# Patient Record
Sex: Male | Born: 1955 | Race: Black or African American | Hispanic: No | Marital: Married | Smoking: Never smoker
Health system: Southern US, Community
[De-identification: ages and names within clinical notes are randomized; demographics above are authoritative.]

## PROBLEM LIST (undated history)

## (undated) DIAGNOSIS — T8131XA Disruption of external operation (surgical) wound, not elsewhere classified, initial encounter: Secondary | ICD-10-CM

## (undated) DIAGNOSIS — K219 Gastro-esophageal reflux disease without esophagitis: Secondary | ICD-10-CM

## (undated) DIAGNOSIS — Z973 Presence of spectacles and contact lenses: Secondary | ICD-10-CM

## (undated) DIAGNOSIS — I2510 Atherosclerotic heart disease of native coronary artery without angina pectoris: Secondary | ICD-10-CM

## (undated) DIAGNOSIS — N189 Chronic kidney disease, unspecified: Secondary | ICD-10-CM

## (undated) DIAGNOSIS — Q613 Polycystic kidney, unspecified: Secondary | ICD-10-CM

## (undated) DIAGNOSIS — E039 Hypothyroidism, unspecified: Secondary | ICD-10-CM

## (undated) DIAGNOSIS — D351 Benign neoplasm of parathyroid gland: Secondary | ICD-10-CM

## (undated) DIAGNOSIS — C801 Malignant (primary) neoplasm, unspecified: Secondary | ICD-10-CM

## (undated) DIAGNOSIS — Z87442 Personal history of urinary calculi: Secondary | ICD-10-CM

## (undated) DIAGNOSIS — G4730 Sleep apnea, unspecified: Secondary | ICD-10-CM

## (undated) DIAGNOSIS — Z841 Family history of disorders of kidney and ureter: Secondary | ICD-10-CM

## (undated) DIAGNOSIS — D649 Anemia, unspecified: Secondary | ICD-10-CM

## (undated) DIAGNOSIS — E785 Hyperlipidemia, unspecified: Secondary | ICD-10-CM

## (undated) DIAGNOSIS — C649 Malignant neoplasm of unspecified kidney, except renal pelvis: Secondary | ICD-10-CM

## (undated) DIAGNOSIS — I10 Essential (primary) hypertension: Secondary | ICD-10-CM

## (undated) DIAGNOSIS — T7840XA Allergy, unspecified, initial encounter: Secondary | ICD-10-CM

## (undated) DIAGNOSIS — N186 End stage renal disease: Secondary | ICD-10-CM

## (undated) HISTORY — PX: NASAL SEPTOPLASTY W/ TURBINOPLASTY: SHX2070

## (undated) HISTORY — PX: COLONOSCOPY: SHX174

## (undated) HISTORY — DX: Sleep apnea, unspecified: G47.30

## (undated) HISTORY — DX: Chronic kidney disease, unspecified: N18.9

## (undated) HISTORY — DX: Allergy, unspecified, initial encounter: T78.40XA

## (undated) HISTORY — DX: Malignant (primary) neoplasm, unspecified: C80.1

## (undated) HISTORY — DX: Gastro-esophageal reflux disease without esophagitis: K21.9

## (undated) HISTORY — PX: VASECTOMY: SHX75

## (undated) HISTORY — PX: HERNIA REPAIR: SHX51

## (undated) HISTORY — DX: Polycystic kidney, unspecified: Q61.3

## (undated) HISTORY — DX: Hyperlipidemia, unspecified: E78.5

## (undated) HISTORY — DX: Essential (primary) hypertension: I10

---

## 2003-02-26 ENCOUNTER — Encounter: Payer: Self-pay | Admitting: Emergency Medicine

## 2003-02-26 ENCOUNTER — Ambulatory Visit (HOSPITAL_BASED_OUTPATIENT_CLINIC_OR_DEPARTMENT_OTHER): Admission: RE | Admit: 2003-02-26 | Discharge: 2003-02-26 | Payer: Self-pay | Admitting: Urology

## 2005-07-26 ENCOUNTER — Ambulatory Visit: Payer: Self-pay | Admitting: Internal Medicine

## 2005-08-01 ENCOUNTER — Ambulatory Visit: Payer: Self-pay | Admitting: Internal Medicine

## 2005-10-23 ENCOUNTER — Ambulatory Visit: Payer: Self-pay | Admitting: Internal Medicine

## 2005-10-30 ENCOUNTER — Ambulatory Visit: Payer: Self-pay | Admitting: Internal Medicine

## 2006-04-26 ENCOUNTER — Ambulatory Visit: Payer: Self-pay | Admitting: Internal Medicine

## 2006-05-14 ENCOUNTER — Ambulatory Visit: Payer: Self-pay | Admitting: Internal Medicine

## 2006-11-14 ENCOUNTER — Ambulatory Visit: Payer: Self-pay | Admitting: Internal Medicine

## 2006-11-27 ENCOUNTER — Ambulatory Visit: Payer: Self-pay | Admitting: Internal Medicine

## 2007-05-02 ENCOUNTER — Encounter: Payer: Self-pay | Admitting: Internal Medicine

## 2007-05-02 DIAGNOSIS — I10 Essential (primary) hypertension: Secondary | ICD-10-CM | POA: Insufficient documentation

## 2007-05-02 DIAGNOSIS — J309 Allergic rhinitis, unspecified: Secondary | ICD-10-CM | POA: Insufficient documentation

## 2007-05-02 DIAGNOSIS — E785 Hyperlipidemia, unspecified: Secondary | ICD-10-CM | POA: Insufficient documentation

## 2007-05-02 DIAGNOSIS — Z87442 Personal history of urinary calculi: Secondary | ICD-10-CM | POA: Insufficient documentation

## 2007-05-06 ENCOUNTER — Ambulatory Visit: Payer: Self-pay | Admitting: Internal Medicine

## 2007-05-06 LAB — CONVERTED CEMR LAB
ALT: 26 units/L (ref 0–53)
AST: 18 units/L (ref 0–37)
Albumin: 3.7 g/dL (ref 3.5–5.2)
Alkaline Phosphatase: 48 units/L (ref 39–117)
BUN: 23 mg/dL (ref 6–23)
Basophils Absolute: 0.1 10*3/uL (ref 0.0–0.1)
Basophils Relative: 1 % (ref 0.0–1.0)
CO2: 29 meq/L (ref 19–32)
Chloride: 106 meq/L (ref 96–112)
Cholesterol: 249 mg/dL (ref 0–200)
Creatinine, Ser: 1.2 mg/dL (ref 0.4–1.5)
HCT: 40.7 % (ref 39.0–52.0)
Hemoglobin: 13.9 g/dL (ref 13.0–17.0)
MCHC: 34 g/dL (ref 30.0–36.0)
Monocytes Absolute: 0.6 10*3/uL (ref 0.2–0.7)
Monocytes Relative: 9.6 % (ref 3.0–11.0)
Protein, U semiquant: NEGATIVE
RBC: 4.61 M/uL (ref 4.22–5.81)
RDW: 13 % (ref 11.5–14.6)
Total Bilirubin: 1.1 mg/dL (ref 0.3–1.2)
Total CHOL/HDL Ratio: 5.1
Total Protein: 6.9 g/dL (ref 6.0–8.3)
Triglycerides: 174 mg/dL — ABNORMAL HIGH (ref 0–149)
Urobilinogen, UA: 0.2
VLDL: 35 mg/dL (ref 0–40)
WBC Urine, dipstick: NEGATIVE
pH: 6.5

## 2007-05-13 ENCOUNTER — Ambulatory Visit: Payer: Self-pay | Admitting: Internal Medicine

## 2007-06-12 ENCOUNTER — Ambulatory Visit: Payer: Self-pay | Admitting: Internal Medicine

## 2007-07-15 ENCOUNTER — Encounter: Payer: Self-pay | Admitting: Internal Medicine

## 2007-07-15 ENCOUNTER — Ambulatory Visit: Payer: Self-pay | Admitting: Internal Medicine

## 2007-11-11 ENCOUNTER — Ambulatory Visit: Payer: Self-pay | Admitting: Internal Medicine

## 2008-03-16 ENCOUNTER — Ambulatory Visit: Payer: Self-pay | Admitting: Internal Medicine

## 2008-04-13 ENCOUNTER — Ambulatory Visit: Payer: Self-pay | Admitting: Internal Medicine

## 2008-04-13 DIAGNOSIS — M109 Gout, unspecified: Secondary | ICD-10-CM | POA: Insufficient documentation

## 2008-05-18 ENCOUNTER — Ambulatory Visit: Payer: Self-pay | Admitting: Internal Medicine

## 2008-08-12 ENCOUNTER — Ambulatory Visit: Payer: Self-pay | Admitting: Internal Medicine

## 2008-08-12 DIAGNOSIS — J069 Acute upper respiratory infection, unspecified: Secondary | ICD-10-CM | POA: Insufficient documentation

## 2009-02-01 ENCOUNTER — Ambulatory Visit: Payer: Self-pay | Admitting: Internal Medicine

## 2009-02-01 ENCOUNTER — Telehealth: Payer: Self-pay | Admitting: Internal Medicine

## 2009-05-25 ENCOUNTER — Encounter: Payer: Self-pay | Admitting: Internal Medicine

## 2009-06-03 ENCOUNTER — Ambulatory Visit: Payer: Self-pay | Admitting: Internal Medicine

## 2009-06-03 LAB — CONVERTED CEMR LAB
ALT: 40 units/L (ref 0–53)
BUN: 23 mg/dL (ref 6–23)
CO2: 28 meq/L (ref 19–32)
Chloride: 111 meq/L (ref 96–112)
Creatinine, Ser: 1.3 mg/dL (ref 0.4–1.5)
Eosinophils Absolute: 0.4 10*3/uL (ref 0.0–0.7)
Eosinophils Relative: 6.2 % — ABNORMAL HIGH (ref 0.0–5.0)
Glucose, Bld: 98 mg/dL (ref 70–99)
HCT: 42.1 % (ref 39.0–52.0)
Lymphs Abs: 2.1 10*3/uL (ref 0.7–4.0)
MCHC: 33.2 g/dL (ref 30.0–36.0)
MCV: 90.3 fL (ref 78.0–100.0)
Monocytes Absolute: 0.6 10*3/uL (ref 0.1–1.0)
PSA: 0.97 ng/mL (ref 0.10–4.00)
Platelets: 219 10*3/uL (ref 150.0–400.0)
Potassium: 3.7 meq/L (ref 3.5–5.1)
Total Bilirubin: 0.8 mg/dL (ref 0.3–1.2)
Total Protein: 7 g/dL (ref 6.0–8.3)
Uric Acid, Serum: 8.2 mg/dL — ABNORMAL HIGH (ref 4.0–7.8)
WBC: 5.8 10*3/uL (ref 4.5–10.5)

## 2009-10-28 ENCOUNTER — Ambulatory Visit: Payer: Self-pay | Admitting: Internal Medicine

## 2009-10-28 DIAGNOSIS — K219 Gastro-esophageal reflux disease without esophagitis: Secondary | ICD-10-CM | POA: Insufficient documentation

## 2010-01-07 ENCOUNTER — Ambulatory Visit: Payer: Self-pay | Admitting: Family Medicine

## 2010-01-09 ENCOUNTER — Telehealth (INDEPENDENT_AMBULATORY_CARE_PROVIDER_SITE_OTHER): Payer: Self-pay | Admitting: *Deleted

## 2010-01-13 ENCOUNTER — Telehealth: Payer: Self-pay | Admitting: Internal Medicine

## 2010-02-09 ENCOUNTER — Ambulatory Visit: Payer: Self-pay | Admitting: Internal Medicine

## 2010-02-09 LAB — CONVERTED CEMR LAB: Uric Acid, Serum: 5.2 mg/dL (ref 4.0–7.8)

## 2010-03-02 ENCOUNTER — Ambulatory Visit: Payer: Self-pay | Admitting: Internal Medicine

## 2010-04-28 ENCOUNTER — Telehealth: Payer: Self-pay | Admitting: Internal Medicine

## 2010-05-11 ENCOUNTER — Ambulatory Visit: Payer: Self-pay | Admitting: Internal Medicine

## 2010-05-11 DIAGNOSIS — G4710 Hypersomnia, unspecified: Secondary | ICD-10-CM | POA: Insufficient documentation

## 2010-05-11 DIAGNOSIS — G4730 Sleep apnea, unspecified: Secondary | ICD-10-CM

## 2010-05-30 ENCOUNTER — Encounter: Payer: Self-pay | Admitting: Internal Medicine

## 2010-05-30 ENCOUNTER — Ambulatory Visit (HOSPITAL_BASED_OUTPATIENT_CLINIC_OR_DEPARTMENT_OTHER): Admission: RE | Admit: 2010-05-30 | Discharge: 2010-05-30 | Payer: Self-pay | Admitting: Internal Medicine

## 2010-06-12 ENCOUNTER — Ambulatory Visit: Payer: Self-pay | Admitting: Pulmonary Disease

## 2010-06-15 DIAGNOSIS — G4733 Obstructive sleep apnea (adult) (pediatric): Secondary | ICD-10-CM | POA: Insufficient documentation

## 2010-06-29 ENCOUNTER — Encounter: Payer: Self-pay | Admitting: Internal Medicine

## 2010-07-04 ENCOUNTER — Telehealth: Payer: Self-pay | Admitting: Internal Medicine

## 2010-07-26 ENCOUNTER — Encounter: Payer: Self-pay | Admitting: Internal Medicine

## 2010-07-27 ENCOUNTER — Ambulatory Visit: Payer: Self-pay | Admitting: Pulmonary Disease

## 2010-08-10 ENCOUNTER — Ambulatory Visit: Payer: Self-pay | Admitting: Internal Medicine

## 2010-10-24 NOTE — Assessment & Plan Note (Signed)
Summary: consult for management of osa   Visit Type:  Initial Consult Copy to:  Bluford Kaufmann MD Primary Provider/Referring Provider:  Marletta Lor  MD  CC:  sleep consult. pt states he stops breathing at night and snores. .  History of Present Illness: The pt is a 55y/o male who I have been asked to see for management of osa.  He underwent split night study in Sept of this year, and was found to have severe osa with AHI of 74/hr during the diagnostic portion of the study.  He was then placed on cpap, and titrated to pressure of 12cm, but never achieved REM on this setting.  The pt has been wearing cpap for the last one month, but feels his pressure is too high.  It can awaken him during the night.  He is not aware of the ramp time or setting.  He is comfortable with his current mask, and denies any major leaks.  He goes to bed btw 10pm and 1am, and arises at 8am to start his day.  Prior to cpap, he had frequent awakenings during the night, and was unrested the following am.  He also had issues with EDS at work.  Since he has been on cpap, he is much more rested in the am's, and has very little sleepiness at work.   The pt tells me that his weight is up 15 pounds  over the last 2 years.  Current Medications (verified): 1)  Amlodipine Besylate 10 Mg Tabs (Amlodipine Besylate) .... One Daily 2)  Lisinopril 40 Mg Tabs (Lisinopril) .... One Daily 3)  Hydrocodone-Acetaminophen 5-500 Mg Tabs (Hydrocodone-Acetaminophen) .... One or Two Every 6 Hours As Needed For Pain 4)  Allopurinol 300 Mg Tabs (Allopurinol) .... Take 1 Tablet By Mouth Every Morning 5)  Vimovo 500-20 Mg Tbec (Naproxen-Esomeprazole) .... One Tablet Two Times A Day  Allergies (verified): No Known Drug Allergies  Past History:  Past Medical History: Allergic rhinitis Hyperlipidemia Hypertension Nephrolithiasis, hx of Gout GERD severe osa.Marland KitchenMarland KitchenAHI 74/hr on 05/2010  Past Surgical History: Reviewed history from  05/02/2007 and no changes required. Nasal septoplasty Vasectomy  Family History: Reviewed history from 05/13/2007 and no changes required. father is in his 61s and in good health details is mother's health unknown 8 brothers and 3 sisters all in good health no family history of cancer  Social History: Reviewed history from 05/13/2007 and no changes required. two children, two grandchildren  never smoker 2 beers per week Married occupation--supervisor  Review of Systems       The patient complains of productive cough, non-productive cough, acid heartburn, indigestion, weight change, nasal congestion/difficulty breathing through nose, and hand/feet swelling.  The patient denies shortness of breath with activity, shortness of breath at rest, coughing up blood, chest pain, irregular heartbeats, loss of appetite, abdominal pain, difficulty swallowing, sore throat, tooth/dental problems, headaches, sneezing, itching, ear ache, anxiety, depression, joint stiffness or pain, rash, change in color of mucus, and fever.    Vital Signs:  Patient profile:   55 year old male Height:      71 inches Weight:      230.13 pounds BMI:     32.21 O2 Sat:      99 % on Room air Temp:     97.9 degrees F oral Pulse rate:   54 / minute BP sitting:   122 / 88  (left arm) Cuff size:   regular  Vitals Entered By: Charma Igo (July 27, 2010  1:32 PM)  O2 Flow:  Room air CC: sleep consult. pt states he stops breathing at night, snores.  Comments meds and allergies updated Phone number updated Charma Igo  July 27, 2010 1:33 PM    Physical Exam  General:  ow male in nad Eyes:  PERRLA and EOMI.   Nose:  mild narrowing due to turbinate hypertrophy, but patent Mouth:  very thick and long uvula and palate , significant side wall narrowing Neck:  no jvd, tmg, LN Lungs:  clear to auscultation, no wheezing or rhonchi Heart:  rrr, no mrg  Abdomen:  soft and nontender, bs+ Extremities:  no edema  noted, no cyanosis  Neurologic:  alert and oriented, moves all 4.   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  He has seen a definite improvement in the quality of his sleep and his daytime alertness level.  He is wearing cpap compliantly by his history, but is having some issue with the pressure.  He is currently on his required level of 12cm, but has some tolerance issues at times.  He has no issue with the mask fit currently.  He is not using his ramp button consistently to help with pressure tolerance, and I have asked him to do so.  If he continues to have tolerance issues, he will let me know.  I have also encouraged him to work aggressively on weight loss.  Medications Added to Medication List This Visit: 1)  Vimovo 500-20 Mg Tbec (Naproxen-esomeprazole) .... One tablet two times a day  Other Orders: Consultation Level IV [id number])  Patient Instructions: 1)  stay on cpap at current setting.  Can increase ramp time to 81min, and can use as often as you need during the night if you awaken. 2)  work on weight loss 3)  followup with me in 54mos, but call if having tolerance issues.   Immunization History:  Influenza Immunization History:    Influenza:  historical (07/26/2010)

## 2010-10-24 NOTE — Assessment & Plan Note (Signed)
Summary: ankle pain//ccm   Vital Signs:  Patient profile:   55 year old male Weight:      222 pounds Temp:     97.9 degrees F oral BP sitting:   140 / 100  (left arm) Cuff size:   regular  Vitals Entered By: Cay Schillings LPN (May 19, 624THL QA348G AM) CC: c/o  (L) ankle swelling and pain  Is Patient Diabetic? No   CC:  c/o  (L) ankle swelling and pain .  History of Present Illness: 55 year old patient has a history of episodic gout.  He was seen one month ago with a gouty arthritis involving both ankles and was placed on allopurinol at that time for the past several days.  She has had increasing pain involving primarily his left lateral ankle, but also somewhat involving the dorsal aspect of the left foot and left great toe.  He as tolerated  allopurinol, well and has been taking 300 mg daily.  He has treated hypertension.  Blood pressure one month ago was in a low normal range.  Preventive Screening-Counseling & Management  Alcohol-Tobacco     Smoking Status: never  Allergies (verified): No Known Drug Allergies  Past History:  Past Medical History: Reviewed history from 10/28/2009 and no changes required. Allergic rhinitis Hyperlipidemia Hypertension Nephrolithiasis, hx of Gout GERD  Social History: Smoking Status:  never  Review of Systems       The patient complains of difficulty walking.  The patient denies anorexia, fever, weight loss, weight gain, vision loss, decreased hearing, hoarseness, chest pain, syncope, dyspnea on exertion, peripheral edema, prolonged cough, headaches, hemoptysis, abdominal pain, melena, hematochezia, severe indigestion/heartburn, hematuria, incontinence, genital sores, muscle weakness, suspicious skin lesions, transient blindness, depression, unusual weight change, abnormal bleeding, enlarged lymph nodes, angioedema, breast masses, and testicular masses.    Physical Exam  General:  overweight-appearing.  140/90overweight-appearing.     Msk:  soft tissue swelling and  excessive warmth involving his left ankle especially the lateral aspect   Impression & Recommendations:  Problem # 1:  GOUT (ICD-274.9)  His updated medication list for this problem includes:    Allopurinol 300 Mg Tabs (Allopurinol) .Marland Kitchen... Take 1 tablet by mouth every morning    His updated medication list for this problem includes:    Allopurinol 300 Mg Tabs (Allopurinol) .Marland Kitchen... Take 1 tablet by mouth every morning  Problem # 2:  HYPERTENSION (ICD-401.9)  His updated medication list for this problem includes:    Amlodipine Besylate 10 Mg Tabs (Amlodipine besylate) ..... One daily    Lisinopril 40 Mg Tabs (Lisinopril) ..... One daily  His updated medication list for this problem includes:    Amlodipine Besylate 10 Mg Tabs (Amlodipine besylate) ..... One daily    Lisinopril 40 Mg Tabs (Lisinopril) ..... One daily  Complete Medication List: 1)  Amlodipine Besylate 10 Mg Tabs (Amlodipine besylate) .... One daily 2)  Lisinopril 40 Mg Tabs (Lisinopril) .... One daily 3)  Hydrocodone-acetaminophen 5-500 Mg Tabs (Hydrocodone-acetaminophen) .... One or two every 6 hours as needed for pain 4)  Allopurinol 300 Mg Tabs (Allopurinol) .... Take 1 tablet by mouth every morning 5)  Prednisone 20 Mg Tabs (Prednisone) .... One twice daily as needed for gout  Other Orders: Venipuncture [id number]) TLB-Uric Acid, Blood (84550-URIC)  Patient Instructions: 1)  Please schedule a follow-up appointment in 4 months. 2)  Limit your Sodium (Salt) to less than 2 grams a day(slightly less than 1/2 a teaspoon) to prevent fluid retention, swelling, or worsening  of symptoms. 3)  It is important that you exercise regularly at least 20 minutes 5 times a week. If you develop chest pain, have severe difficulty breathing, or feel very tired , stop exercising immediately and seek medical attention. 4)  You need to lose weight. Consider a lower calorie diet and regular exercise.  5)   Check your Blood Pressure regularly. If it is above: 150/90 you should make an appointment. Prescriptions: PREDNISONE 20 MG TABS (PREDNISONE) one twice daily as needed for gout  #20 x 0   Entered and Authorized by:   Marletta Lor  MD   Signed by:   Marletta Lor  MD on 02/09/2010   Method used:   Electronically to        [id number]* (retail)       [place], [place]  [address]       Ph: [phone]       Fax: [phone]   RxID:   [id number] LISINOPRIL 40 MG TABS (LISINOPRIL) one daily  #90 Tablet x 2   Entered and Authorized by:   Marletta Lor  MD   Signed by:   Marletta Lor  MD on 02/09/2010   Method used:   Electronically to        [id number]* (retail)       [place], [place]  [address]       Ph: [phone]       Fax: [phone]   RxID:   [id number] AMLODIPINE BESYLATE 10 MG TABS (AMLODIPINE BESYLATE) one daily  #90 Tablet x 2   Entered and Authorized by:   Marletta Lor  MD   Signed by:   Marletta Lor  MD on 02/09/2010   Method used:   Electronically to        [id number]* (retail)       [place], [place]  [address]       Ph: [phone]       Fax: [phone]   RxIDPJ:[id number]   [id number] ALLOPURINOL 300 MG TABS (ALLOPURINOL) Take 1 tablet by mouth every morning  #100 x 3   Entered and Authorized by:   Marletta Lor  MD   Signed by:   Marletta Lor  MD on 02/09/2010   Method used:   Electronically to        [facility] [id number] (retail)       [address]       [place], [place]  [address]       Ph: [phone]       Fax: [phone]   RxID:   [id number]

## 2010-10-24 NOTE — Progress Notes (Signed)
Summary: refer pulom - cpap setting  Phone Note Call from Patient   Caller: Patient Call For: [phone] Summary of Call: pt has questions about cpap machine ?adjustment to air flow. pt was advise to call [facility] for futher instruction. [facility] told him to [facility].  Initial call taken by: Glo Herring,  July 04, 2010 11:29 AM  Follow-up for Phone Call        please schedule appt with Dr Sabino Donovan Follow-up by: Marletta Lor  MD,  July 04, 2010 12:49 PM  Additional Follow-up for Phone Call Additional follow up Details #1::        attempt to call pt - cell# vm - LMTCB if anyquestions - will make referal to Dr. Gwenette Greet [facility]) to eval and he will be the one directing any changes tosettings on CPAP machine  KIK Additional Follow-up by: Cay Schillings LPN,  October 11, 624THL 2:17 PM

## 2010-10-24 NOTE — Assessment & Plan Note (Signed)
Summary: F/U ON HTN // RS   Vital Signs:  Patient profile:   55 year old male Weight:      214 pounds BMI:     29.95 Temp:     98.3 degrees F oral BP sitting:   124 / 88  (left arm) Cuff size:   regular  Vitals Entered By: Chipper Oman, RN (October 28, 2009 11:03 AM) CC: C/o recent heartburn.   CC:  C/o recent heartburn..  History of Present Illness: 55 year old patient who is seen today for follow-up of hypertension, gout, and gastroesophageal reflux disease.  He has had no recurrent gout.  Laboratory studies did confirm hyperuricemia.  He is no longer on diuretic therapy for blood pressure control.  His only complaint today is some mild/moderate reflux symptoms.  Allergies: No Known Drug Allergies  Past History:  Past Medical History: Allergic rhinitis Hyperlipidemia Hypertension Nephrolithiasis, hx of Gout GERD  Review of Systems       The patient complains of severe indigestion/heartburn.  The patient denies anorexia, fever, weight loss, weight gain, vision loss, decreased hearing, hoarseness, chest pain, syncope, dyspnea on exertion, peripheral edema, prolonged cough, headaches, hemoptysis, abdominal pain, melena, hematochezia, hematuria, incontinence, genital sores, muscle weakness, suspicious skin lesions, transient blindness, difficulty walking, depression, unusual weight change, abnormal bleeding, enlarged lymph nodes, angioedema, breast masses, and testicular masses.    Physical Exam  General:  Well-developed,well-nourished,in no acute distress; alert,appropriate and cooperative throughout examination Head:  Normocephalic and atraumatic without obvious abnormalities. No apparent alopecia or balding. Eyes:  No corneal or conjunctival inflammation noted. EOMI. Perrla. Funduscopic exam benign, without hemorrhages, exudates or papilledema. Vision grossly normal. Mouth:  Oral mucosa and oropharynx without lesions or exudates.  Teeth in good repair. Neck:  No  deformities, masses, or tenderness noted. Lungs:  Normal respiratory effort, chest expands symmetrically. Lungs are clear to auscultation, no crackles or wheezes. Heart:  Normal rate and regular rhythm. S1 and S2 normal without gallop, murmur, click, rub or other extra sounds. Abdomen:  Bowel sounds positive,abdomen soft and non-tender without masses, organomegaly or hernias noted.   Impression & Recommendations:  Problem # 1:  GOUT (ICD-274.9)  His updated medication list for this problem includes:    Colchicine 0.6 Mg Tabs (Colchicine) .Marland Kitchen... Take 1 tablet by mouth twice a day as needed  His updated medication list for this problem includes:    Colchicine 0.6 Mg Tabs (Colchicine) .Marland Kitchen... Take 1 tablet by mouth twice a day as needed  Problem # 2:  HYPERTENSION (ICD-401.9)  His updated medication list for this problem includes:    Amlodipine Besylate 10 Mg Tabs (Amlodipine besylate) ..... One daily    Lisinopril 40 Mg Tabs (Lisinopril) ..... One daily  His updated medication list for this problem includes:    Amlodipine Besylate 10 Mg Tabs (Amlodipine besylate) ..... One daily    Lisinopril 40 Mg Tabs (Lisinopril) ..... One daily  Problem # 3:  GERD (ICD-530.81)  Complete Medication List: 1)  Colchicine 0.6 Mg Tabs (Colchicine) .... Take 1 tablet by mouth twice a day as needed 2)  Diclofenac Sodium 75 Mg Tbec (Diclofenac sodium) .... Take 1 tablet by mouth twice a day 3)  Amlodipine Besylate 10 Mg Tabs (Amlodipine besylate) .... One daily 4)  Lisinopril 40 Mg Tabs (Lisinopril) .... One daily 5)  Hydrocodone-acetaminophen 5-500 Mg Tabs (Hydrocodone-acetaminophen) .... One or two every 6 hours as needed for pain  Patient Instructions: 1)  Please schedule a follow-up appointment in 6  months. 2)  Limit your Sodium (Salt) to less than 2 grams a day(slightly less than 1/2 a teaspoon) to prevent fluid retention, swelling, or worsening of symptoms. 3)  Avoid foods high in acid (tomatoes,  citrus juices, spicy foods). Avoid eating within two hours of lying down or before exercising. Do not over eat; try smaller more frequent meals. Elevate head of bed twelve inches when sleeping. 4)  It is important that you exercise regularly at least 20 minutes 5 times a week. If you develop chest pain, have severe difficulty breathing, or feel very tired , stop exercising immediately and seek medical attention.

## 2010-10-24 NOTE — Assessment & Plan Note (Signed)
Summary: 3 MONTH FOLLOW UP/CJR rsc bmp/njr   Vital Signs:  Patient profile:   55 year old male Weight:      235 pounds Temp:     98.1 degrees F oral BP sitting:   116 / 88  (left arm) Cuff size:   regular  Vitals Entered By: Cay Schillings LPN (November 17, 624THL 11:44 AM) CC: 3 mos rov - doing ok , ankles swelling? Is Patient Diabetic? No   CC:  3 mos rov - doing ok  and ankles swelling?Marland Kitchen  Allergies (verified): No Known Drug Allergies  and  Complete Medication List: 1)  Amlodipine Besylate 10 Mg Tabs (Amlodipine besylate) .... One daily 2)  Lisinopril 40 Mg Tabs (Lisinopril) .... One daily 3)  Allopurinol 300 Mg Tabs (Allopurinol) .... Take 1 tablet by mouth every morning 4)  Vimovo 500-20 Mg Tbec (Naproxen-esomeprazole) .... One tablet two times a day 5)  Furosemide 40 Mg Tabs (Furosemide) .... One daily as needed  for swelling  Patient Instructions: 1)  Please schedule a follow-up appointment in 6 months. 2)  Limit your Sodium (Salt). 3)  It is important that you exercise regularly at least 20 minutes 5 times a week. If you develop chest pain, have severe difficulty breathing, or feel very tired , stop exercising immediately and seek medical attention. 4)  You need to lose weight. Consider a lower calorie diet and regular exercise.  5)  Check your Blood Pressure regularly. If it is above: 150/90 you should make an appointment. Prescriptions: FUROSEMIDE 40 MG TABS (FUROSEMIDE) one daily as needed  for swelling  #50 x 2   Entered and Authorized by:   Marletta Lor  MD   Signed by:   Marletta Lor  MD on 08/10/2010   Method used:   Electronically to        [facility] [address] (retail)       [place], [place]  [address]       Ph: [phone]       Fax: [phone]   RxID:   [id number] ALLOPURINOL 300 MG TABS (ALLOPURINOL) Take 1 tablet by mouth every morning  #100 x 3   Entered and Authorized by:   Marletta Lor  MD   Signed by:   Marletta Lor  MD on 08/10/2010   Method used:   Electronically to        [id number] (retail)       [place], [place]  [address]       Ph: [phone]       Fax: [phone]   RxID:   [id number] LISINOPRIL 40 MG TABS (LISINOPRIL) one daily  #90 Tablet x 2   Entered and Authorized by:   Marletta Lor  MD   Signed by:   Marletta Lor  MD on 08/10/2010   Method used:   Electronically to        [facility] [id number] (retail)       [place], [place]  [address]       Ph: [phone]       Fax: [phone]   RxID:   [id number] AMLODIPINE BESYLATE 10 MG TABS (AMLODIPINE BESYLATE) one daily  #90 Tablet x 2   Entered and Authorized by:   Marletta Lor  MD   Signed by:   Marletta Lor  MD on 08/10/2010   Method used:   Electronically to        [facility] [address] (retail)       [place], [place]  [address]       Ph: [phone]       Fax: [phone]   RxID:   [id number]       Appended Document: 3 MONTH FOLLOW UP/CJR rsc bmp/njr 55 year old patient who has treated hypertension, who was seen for follow-up.  He has a history of severe OSA and was seen in and pulmonary follow-up two weeks ago.  Antihypertensive regimen includes amlodipine 10 mg daily.  He has had some intermittent pedal edema.  Thiazide diuretics have been discontinued due to his history of gout.  His gout has been stable.  He also has gastroesophageal reflux disease  HEENT-  unremarkable neck supple.  No adenopathy chest clear vascular normal S1, S2, no murmurs or gallops abdomen soft and nontender.  No organomegaly extremities revealed trace pedal edema  Impression hypertension, stable pedal  edema aggravated by amlodipine  Recommendations- salt restriction encouraged.  He was given a prescription for furosemide to take  p.r.n. edema.  Will consider dose  reduction of amlodipine if swelling persists in spite of p.r.n., furosemide, and salt restriction

## 2010-10-24 NOTE — Progress Notes (Signed)
Summary: Call-A-Nurse Report    Call-A-Nurse Triage Call Report Triage Record Num: [id number] Operator: Hughes Better Patient Name: Phillip Blair Call Date & Time: 01/07/2010 9:25:27AM Patient Phone: [phone] PCP: Marletta Lor Patient Gender: Male PCP Fax : [phone] Patient DOB: [birth year] Practice Name: [facility] Reason for Call: Patient is having gout in both feet. Onset of symptoms: 2-3 days. Patient is in severe pain. Patient advised to go to [facility] office now for evaluation. Protocol(s) Used: Foot Non-Injury Recommended Outcome per Protocol: See Provider within 24 hours Reason for Outcome: New onset mild to moderate pain that has not improved with 24 hours of home care Care Advice:  ~ 01/07/2010 9:49:32AM Page 1 of 1 [email or web address]

## 2010-10-24 NOTE — Progress Notes (Signed)
Summary: missed 6 mos rov  Phone Note Outgoing Call   Call placed by: Cay Schillings LPN,  August  5, 624THL 9:06 AM Call placed to: Patient Summary of Call: missed 6 mos rov this am - pt forgot - transferred to sheduling to r/s .  Initial call taken by: Cay Schillings LPN,  August  5, 624THL 9:07 AM

## 2010-10-24 NOTE — Assessment & Plan Note (Signed)
Summary: GOUT//VGJ   Vital Signs:  Patient profile:   55 year old male Weight:      218 pounds Temp:     98.5 degrees F oral BP sitting:   110 / 78  (left arm)  Vitals Entered By: Malachi Bonds (January 07, 2010 10:02 AM) CC: gout flare in both feet    CC:  gout flare in both feet .  History of Present Illness: fred. is a 55 y/o male with recurrent gout rx w   colchicine.  last ua level 8.2 9/10.Marland Kitchennow w 3 day h/o pain both grt. toes.  Allergies: No Known Drug Allergies  Past History:  Past medical, surgical, family and social histories (including risk factors) reviewed for relevance to current acute and chronic problems.  Past Medical History: Reviewed history from 10/28/2009 and no changes required. Allergic rhinitis Hyperlipidemia Hypertension Nephrolithiasis, hx of Gout GERD  Past Surgical History: Reviewed history from 05/02/2007 and no changes required. Nasal septoplasty Vasectomy  Family History: Reviewed history from 05/13/2007 and no changes required. father is in his 76s and in good health details is mother's health unknown 8 brothers and 3 sisters all in good health no family history of cancer  Social History: Reviewed history from 05/13/2007 and no changes required. two children, two grandchildren nonsmoker Married  Review of Systems      See HPI  Physical Exam  General:  Well-developed,well-nourished,in no acute distress; alert,appropriate and cooperative throughout examination Msk:  bilat grt toes red,swollen   Impression & Recommendations:  Problem # 1:  GOUT (ICD-274.9) Assessment Deteriorated  The following medications were removed from the medication list:    Colchicine 0.6 Mg Tabs (Colchicine) .Marland Kitchen... Take 1 tablet by mouth twice a day as needed His updated medication list for this problem includes:    Allopurinol 300 Mg Tabs (Allopurinol) .Marland Kitchen... Take 1 tablet by mouth every morning  Complete Medication List: 1)  Amlodipine Besylate  10 Mg Tabs (Amlodipine besylate) .... One daily 2)  Lisinopril 40 Mg Tabs (Lisinopril) .... One daily 3)  Hydrocodone-acetaminophen 5-500 Mg Tabs (Hydrocodone-acetaminophen) .... One or two every 6 hours as needed for pain 4)  Allopurinol 300 Mg Tabs (Allopurinol) .... Take 1 tablet by mouth every morning 5)  Prednisone 20 Mg Tabs (Prednisone) .... Uad for acute gout  Patient Instructions: 1)  pred 20 mg...2 x 3days, 1 x 3 days, 1/2 x 3 days stop 2)  vicodin q 4h as needed pain 3)  allopurinol 300mg ..1 day forever  4)  Please schedule a follow-up appointment as needed. Prescriptions: HYDROCODONE-ACETAMINOPHEN 5-500 MG TABS (HYDROCODONE-ACETAMINOPHEN) one or two every 6 hours as needed for pain  #30 x 0   Entered and Authorized by:   Dorena Cookey MD   Signed by:   Dorena Cookey MD on 01/07/2010   Method used:   Print then Give to Patient   RxID:   [id number] PREDNISONE 20 MG TABS (PREDNISONE) UAD for acute gout  #30 x 1   Entered and Authorized by:   Dorena Cookey MD   Signed by:   Dorena Cookey MD on 01/07/2010   Method used:   Print then Give to Patient   RxID:   [id number] ALLOPURINOL 300 MG TABS (ALLOPURINOL) Take 1 tablet by mouth every morning  #100 x 3   Entered and Authorized by:   Dorena Cookey MD   Signed by:   Dorena Cookey MD on 01/07/2010   Method used:   Print  then Give to Patient   RxID:   [id number]

## 2010-10-24 NOTE — Assessment & Plan Note (Signed)
Summary: L FOOT PAIN // RS   Vital Signs:  Patient profile:   55 year old male Weight:      223 pounds Temp:     98.0 degrees F oral BP sitting:   140 / 92  (left arm) Cuff size:   regular  Vitals Entered By: Cay Schillings LPN (June  9, 624THL 624THL PM) CC: c/o (L) top foot pain and swelling  Is Patient Diabetic? No   CC:  c/o (L) top foot pain and swelling .  History of Present Illness: 55 year old patient has a history of recurrent gout.  He was seen here earlier with suspected gout involving the left foot and ankle and was treated  with a short course of prednisone with a prompt clinical response.  For the past few days.  He has had increasing pain involving the left ankle and dorsal aspect of the left foot.  He is a difficult time with ambulation.  A  uric acid level was checked last visit and was less than 6. he has treated hypertension, which has been stable  Preventive Screening-Counseling & Management  Alcohol-Tobacco     Smoking Status: never  Allergies (verified): No Known Drug Allergies  Physical Exam  General:  Well-developed,well-nourished,in no acute distress; alert,appropriate and cooperative throughout examination Msk:  patient has tenderness mild soft tissue swelling and excessive warmth involving the proximal dorsal aspect of the left foot at the ankle.  There is also mild tenderness involving the left lateral ankle   Impression & Recommendations:  Problem # 1:  GOUT (ICD-274.9)  His updated medication list for this problem includes:    Allopurinol 300 Mg Tabs (Allopurinol) .Marland Kitchen... Take 1 tablet by mouth every morning    His updated medication list for this problem includes:    Allopurinol 300 Mg Tabs (Allopurinol) .Marland Kitchen... Take 1 tablet by mouth every morning  Problem # 2:  HYPERTENSION (ICD-401.9)  His updated medication list for this problem includes:    Amlodipine Besylate 10 Mg Tabs (Amlodipine besylate) ..... One daily    Lisinopril 40 Mg Tabs  (Lisinopril) ..... One daily  His updated medication list for this problem includes:    Amlodipine Besylate 10 Mg Tabs (Amlodipine besylate) ..... One daily    Lisinopril 40 Mg Tabs (Lisinopril) ..... One daily  Complete Medication List: 1)  Amlodipine Besylate 10 Mg Tabs (Amlodipine besylate) .... One daily 2)  Lisinopril 40 Mg Tabs (Lisinopril) .... One daily 3)  Hydrocodone-acetaminophen 5-500 Mg Tabs (Hydrocodone-acetaminophen) .... One or two every 6 hours as needed for pain 4)  Allopurinol 300 Mg Tabs (Allopurinol) .... Take 1 tablet by mouth every morning 5)  Prednisone 20 Mg Tabs (Prednisone) .... One twice daily as needed for gout 6)  Prednisone 5 Mg Tabs (Prednisone) .... One twice daily  Other Orders: Prescription Created Electronically [id number])  Patient Instructions: 1)  prednisone 20 mg twice daily for one week followed by 5 mg twice daily for one month 2)  after prednisone completed start Aleve twice daily Prescriptions: PREDNISONE 5 MG TABS (PREDNISONE) one twice daily  #60 x 0   Entered and Authorized by:   Marletta Lor  MD   Signed by:   Marletta Lor  MD on 03/02/2010   Method used:   Print then Give to Patient   RxID:   [id number] PREDNISONE 20 MG TABS (PREDNISONE) one twice daily as needed for gout  #20 x 0   Entered and Authorized by:   Doretha Sou  Burnice Logan  MD   Signed by:   Marletta Lor  MD on 03/02/2010   Method used:   Print then Give to Patient   RxID:   [id number] PREDNISONE 5 MG TABS (PREDNISONE) one twice daily  #60 x 0   Entered and Authorized by:   Marletta Lor  MD   Signed by:   Marletta Lor  MD on 03/02/2010   Method used:   Electronically to        [facility] [address] (retail)       [place], [place]  [address]       Ph: [phone]       Fax: [phone]   RxIDSO:[id number] PREDNISONE 20 MG TABS (PREDNISONE) one twice daily as needed for gout  #20 x 0    Entered and Authorized by:   Marletta Lor  MD   Signed by:   Marletta Lor  MD on 03/02/2010   Method used:   Electronically to        [facility] [name] (retail)       [address]       [place], [place]  [address]       Ph: [phone]       Fax: [phone]   RxID:   [id number]

## 2010-10-24 NOTE — Medication Information (Signed)
Summary: Order for CPAP Supplies/[facility]  Order for CPAP Supplies/[facility]   Imported By: Laural Benes 07/04/2010 09:38:48  _____________________________________________________________________  External Attachment:    Type:   Image     Comment:   External Document

## 2010-10-24 NOTE — Progress Notes (Signed)
  Phone Note Call from Patient   Caller: Patient Reason for Call: Acute Illness Summary of Call: call relayed to me by the answering service! Patient had been seen by Dr. Sherren Mocha for gout. He is completing a course of prednisone but stopped medication when pain in his ankle came back thinking it was the prednisone (warning literature lists bone pain as a complication). Now his ankle is very painful and he cannot bear weight.  Informed pt that with starting allopurinol he can have a flare of gout. Advised to take prednisone 40mg  once daily x 2 and then 20mg  once daily until Rx gone. Continue allopurinol. Mayuse hydrocodone for uncontrolled pain. Initial call taken by: Neena Rhymes MD,  January 13, 2010 10:33 AM

## 2010-10-24 NOTE — Assessment & Plan Note (Signed)
Summary: 6 MONTH ROV/NJR/PT RSC/CJR  well aand a the and and a and a and a little and he and his the E. he is who is in and a  Vital Signs:  Patient profile:   55 year old male Weight:      229 pounds Temp:     98.3 degrees F oral BP sitting:   138 / 88  (left arm) Cuff size:   regular  Vitals Entered By: Cay Schillings LPN (August 18, 624THL 8:23 AM) aCC: 6 mos rov - doing well Is Patient Diabetic? No   CC:  6 mos rov - doing well.  History of Present Illness: 55 year old patient who is seen today for follow up of his hypertension.  He has a history of Gastrosoft or reflux disease, which has worsened with weight gain.  He has a history of gout, which has been stable.  May complaint is some loud snoring at night, associated with some daytime sleepiness.  Apparently, he  underwent a sleep study 8 to 10 years ago.  Preventive Screening-Counseling & Management  Alcohol-Tobacco     Smoking Status: never  Allergies (verified): No Known Drug Allergies  Past History:  Past Medical History: Allergic rhinitis Hyperlipidemia Hypertension Nephrolithiasis, hx of Gout GERD rule out obstructive sleep apnea  Past Surgical History: Reviewed history from 05/02/2007 and no changes required. Nasal septoplasty Vasectomy  Review of Systems       The patient complains of weight gain and severe indigestion/heartburn.  The patient denies anorexia, fever, weight loss, vision loss, decreased hearing, hoarseness, chest pain, syncope, dyspnea on exertion, peripheral edema, prolonged cough, headaches, hemoptysis, abdominal pain, melena, hematochezia, hematuria, incontinence, genital sores, muscle weakness, suspicious skin lesions, transient blindness, difficulty walking, depression, unusual weight change, abnormal bleeding, enlarged lymph nodes, angioedema, breast masses, and testicular masses.    Physical Exam  General:  overweight-appearing.  140/84overweight-appearing.   Head:   Normocephalic and atraumatic without obvious abnormalities. No apparent alopecia or balding. Eyes:  No corneal or conjunctival inflammation noted. EOMI. Perrla. Funduscopic exam benign, without hemorrhages, exudates or papilledema. Vision grossly normal. Mouth:  Oral mucosa and oropharynx without lesions or exudates.  Teeth in good repair.pharyngeal crowding and low-lying uvula.  pharyngeal crowding and low-lying uvula.   Neck:  No deformities, masses, or tenderness noted. Lungs:  Normal respiratory effort, chest expands symmetrically. Lungs are clear to auscultation, no crackles or wheezes. Heart:  Normal rate and regular rhythm. S1 and S2 normal without gallop, murmur, click, rub or other extra sounds. Abdomen:  Bowel sounds positive,abdomen soft and non-tender without masses, organomegaly or hernias noted. Msk:  No deformity or scoliosis noted of thoracic or lumbar spine.   Pulses:  R and L carotid,radial,femoral,dorsalis pedis and posterior tibial pulses are full and equal bilaterally Extremities:  No clubbing, cyanosis, edema, or deformity noted with normal full range of motion of all joints.     Impression & Recommendations:  Problem # 1:  GERD (ICD-530.81) weight loss, and anti-reflux measures discussed  Problem # 2:  HYPERTENSION (ICD-401.9)  His updated medication list for this problem includes:    Amlodipine Besylate 10 Mg Tabs (Amlodipine besylate) ..... One daily    Lisinopril 40 Mg Tabs (Lisinopril) ..... One daily  His updated medication list for this problem includes:    Amlodipine Besylate 10 Mg Tabs (Amlodipine besylate) ..... One daily    Lisinopril 40 Mg Tabs (Lisinopril) ..... One daily  Problem # 3:  GOUT (ICD-274.9)  His updated medication list for  this problem includes:    Allopurinol 300 Mg Tabs (Allopurinol) .Marland Kitchen... Take 1 tablet by mouth every morning  His updated medication list for this problem includes:    Allopurinol 300 Mg Tabs (Allopurinol) .Marland Kitchen... Take 1  tablet by mouth every morning  Problem # 4:  HYPERSOMNIA, ASSOCIATED WITH SLEEP APNEA (ICD-780.53)  will check a sleep study  Complete Medication List: 1)  Amlodipine Besylate 10 Mg Tabs (Amlodipine besylate) .... One daily 2)  Lisinopril 40 Mg Tabs (Lisinopril) .... One daily 3)  Hydrocodone-acetaminophen 5-500 Mg Tabs (Hydrocodone-acetaminophen) .... One or two every 6 hours as needed for pain 4)  Allopurinol 300 Mg Tabs (Allopurinol) .... Take 1 tablet by mouth every morning  Other Orders: Sleep Disorder Referral (Sleep Disorder)  Patient Instructions: 1)  Please schedule a follow-up appointment in 3 months. 2)  Limit your Sodium (Salt). 3)  Avoid foods high in acid (tomatoes, citrus juices, spicy foods). Avoid eating within two hours of lying down or before exercising. Do not over eat; try smaller more frequent meals. Elevate head of bed twelve inches when sleeping. 4)  It is important that you exercise regularly at least 20 minutes 5 times a week. If you develop chest pain, have severe difficulty breathing, or feel very tired , stop exercising immediately and seek medical attention. 5)  You need to lose weight. Consider a lower calorie diet and regular exercise.  6)  Check your Blood Pressure regularly. If it is above:150/90  you should make an appointment. Prescriptions: ALLOPURINOL 300 MG TABS (ALLOPURINOL) Take 1 tablet by mouth every morning  #100 x 3   Entered and Authorized by:   Marletta Lor  MD   Signed by:   Marletta Lor  MD on 05/11/2010   Method used:   Electronically to        [facility] [id number] (retail)       [place], [place]  [address]       Ph: [phone]       Fax: [phone]   [id number] LISINOPRIL 40 MG TABS (LISINOPRIL) one daily  #90 Tablet x 2   Entered and Authorized by:   Marletta Lor  MD   Signed by:   Marletta Lor  MD on 05/11/2010   Method used:   Electronically to         [facility] [id number] (retail)       [place], [place]  [address]       Ph: [phone]       Fax: [phone]   [id number] AMLODIPINE BESYLATE 10 MG TABS (AMLODIPINE BESYLATE) one daily  #90 Tablet x 2   Entered and Authorized by:   Marletta Lor  MD   Signed by:   Marletta Lor  MD on 05/11/2010   Method used:   Electronically to        [facility] [id number] (retail)       [place], [place]  [address]       Ph: [phone]       Fax: [phone]   [id number]   Appended Document: 6 MONTH ROV/NJR/PT RSC/CJR please schedule for [facility] to set up CPAP due due severe OSA  Appended Document: 6 MONTH ROV/NJR/PT RSC/CJR sleep study found  on echart - given to Dr. Nonie Hoyer - reviewed - I apptmpted to call pt at [facility] - sleep apnea - will need CPAP set up - info given to terri to arrange. 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

## 2010-10-27 NOTE — Miscellaneous (Signed)
Summary: Flu Shot/[facility]  Flu Shot/[facility]   Imported By: Laural Benes 08/15/2010 10:56:36  _____________________________________________________________________  External Attachment:    Type:   Image     Comment:   External Document

## 2011-01-16 ENCOUNTER — Ambulatory Visit (INDEPENDENT_AMBULATORY_CARE_PROVIDER_SITE_OTHER): Payer: PRIVATE HEALTH INSURANCE | Admitting: Internal Medicine

## 2011-01-16 ENCOUNTER — Encounter: Payer: Self-pay | Admitting: Internal Medicine

## 2011-01-16 DIAGNOSIS — I10 Essential (primary) hypertension: Secondary | ICD-10-CM

## 2011-01-16 NOTE — Assessment & Plan Note (Signed)
Suboptimal control however may represent isolated elevation. Recommend out patient blood pressure log to be submitted for review over the next several days. Reminded benefit of low-sodium diet exercise weight loss and attention to treatment of OSA. Continue current medication regimen pending review of blood pressure log.

## 2011-01-16 NOTE — Progress Notes (Signed)
  Subjective:    Patient ID: Phillip Blair, male    DOB: [birth year], 55 y.o.   MRN: [id number]  HPI Pt presents to clinic for evaluation of elevated blood pressure. States he was in his normal state of health until this morning after going to the dentist developed frontal headache mild dizziness and minimal shortness of breath without chest pain or chest pressure. Check his blood pressure found to be a hundred fifty-nine over ninety-eight. Has history of hypertension and is compliant with  medication without adverse effect. Has history of OSA treated with CPAP and is compliant with CPAP. No recent outpatient blood pressure values for review. Reviewed last three outpatient blood pressures demonstrating normotensive to high normal control. Blood pressure rechecked today in clinic to be a hundred thirty over ninety patient states symptoms have nearly resolved with only minimal residual headache. Taking no other new medications and has had no other obvious trigger. No alleviating or exacerbating factors. No other complaints  Reviewed past medical history, medications and allergies.    Review of Systems see history of present illness     Objective:   Physical Exam    Physical Exam  [nursing notereviewed. Constitutional:  appears well-developed and well-nourished. No distress.  HENT:  Head: Normocephalic and atraumatic.   Eyes: Conjunctivae are normal. No scleral icterus. Funduscopic exam normal Neck: Neck supple.  Cardiovascular: Normal rate, regular rhythm and normal heart sounds.  Exam reveals no gallop and no friction rub.   No murmur heard. Pulmonary/Chest: Effort normal and breath sounds normal. No respiratory distress. She has no wheezes. She has no rales.  Lymphadenopathy:     no cervical adenopathy.  Neurological:  alert.  Skin: Skin is warm and dry.  not diaphoretic.     Assessment & Plan:

## 2011-02-08 ENCOUNTER — Encounter: Payer: Self-pay | Admitting: Internal Medicine

## 2011-02-08 ENCOUNTER — Ambulatory Visit (INDEPENDENT_AMBULATORY_CARE_PROVIDER_SITE_OTHER): Payer: PRIVATE HEALTH INSURANCE | Admitting: Internal Medicine

## 2011-02-08 DIAGNOSIS — K219 Gastro-esophageal reflux disease without esophagitis: Secondary | ICD-10-CM

## 2011-02-08 DIAGNOSIS — I10 Essential (primary) hypertension: Secondary | ICD-10-CM

## 2011-02-08 DIAGNOSIS — G4733 Obstructive sleep apnea (adult) (pediatric): Secondary | ICD-10-CM

## 2011-02-08 MED ORDER — ALLOPURINOL 300 MG PO TABS: 300.0000 mg | ORAL_TABLET | Freq: Every day | ORAL | Status: DC

## 2011-02-08 MED ORDER — FUROSEMIDE 40 MG PO TABS: 40.0000 mg | ORAL_TABLET | Freq: Every day | ORAL | Status: DC | PRN

## 2011-02-08 MED ORDER — LISINOPRIL 40 MG PO TABS: 40.0000 mg | ORAL_TABLET | Freq: Every day | ORAL | Status: DC

## 2011-02-08 MED ORDER — AMLODIPINE BESYLATE 10 MG PO TABS: 10.0000 mg | ORAL_TABLET | Freq: Every day | ORAL | Status: DC

## 2011-02-08 NOTE — Patient Instructions (Signed)
Limit your sodium (Salt) intake    It is important that you exercise regularly, at least 20 minutes 3 to 4 times per week.  If you develop chest pain or shortness of breath seek  medical attention.  Please check your blood pressure on a regular basis.  If it is consistently greater than 150/90, please make an office appointment.  Return in 6 months for follow-up   

## 2011-02-08 NOTE — Progress Notes (Signed)
  Subjective:    Patient ID: Phillip Blair, male    DOB: [birth year], 55 y.o.   MRN: [id number]  HPI  55 year old patient who is seen today for followup. He has a history of hypertension on triple therapy. He was seen last month after elevator reading at his dentist. Blood pressures have been better controlled. He has a history of gout which has been well controlled on allopurinol. He also has a history of obstructive sleep apnea requiring CPAP which he tolerates well. No new concerns or complaints. There has been some weight gain over time   Review of Systems  Constitutional: Negative for fever, chills, appetite change and fatigue.  HENT: Negative for hearing loss, ear pain, congestion, sore throat, trouble swallowing, neck stiffness, dental problem, voice change and tinnitus.   Eyes: Negative for pain, discharge and visual disturbance.  Respiratory: Negative for cough, chest tightness, wheezing and stridor.   Cardiovascular: Negative for chest pain, palpitations and leg swelling.  Gastrointestinal: Negative for nausea, vomiting, abdominal pain, diarrhea, constipation, blood in stool and abdominal distention.  Genitourinary: Negative for urgency, hematuria, flank pain, discharge, difficulty urinating and genital sores.  Musculoskeletal: Negative for myalgias, back pain, joint swelling, arthralgias and gait problem.  Skin: Negative for rash.  Neurological: Negative for dizziness, syncope, speech difficulty, weakness, numbness and headaches.  Hematological: Negative for adenopathy. Does not bruise/bleed easily.  Psychiatric/Behavioral: Negative for behavioral problems and dysphoric mood. The patient is not nervous/anxious.        Objective:   Physical Exam  Constitutional: He is oriented to person, place, and time. He appears well-developed and well-nourished. No distress.       Overweight Blood pressure 120/80  HENT:  Head: Normocephalic.  Right Ear: External ear normal.  Left Ear:  External ear normal.  Eyes: Conjunctivae and EOM are normal.  Neck: Normal range of motion.  Cardiovascular: Normal rate and normal heart sounds.   Pulmonary/Chest: Breath sounds normal.  Abdominal: Bowel sounds are normal.  Musculoskeletal: Normal range of motion. He exhibits no edema and no tenderness.  Neurological: He is alert and oriented to person, place, and time.  Psychiatric: He has a normal mood and affect. His behavior is normal.          Assessment & Plan:   Hypertension stable. Lifestyle issues addressed Gout. Also stable Obstructive sleep apnea. We'll continue CPAP  CPX 6 months

## 2011-02-09 NOTE — Op Note (Signed)
Phillip Blair, Phillip Blair                        ACCOUNT NO.:  [id number]   MEDICAL RECORD NO.:  [id number]                   PATIENT TYPE:  AMB   LOCATION:  [facility]                                 FACILITY:  [facility]   PHYSICIAN:  Hanley Ben, M.D.               DATE OF BIRTH:  Mar 17, 1956   DATE OF PROCEDURE:  02/26/2003  DATE OF DISCHARGE:                                 OPERATIVE REPORT   PREOPERATIVE DIAGNOSES:  Right ureteral stone with hydronephrosis.   POSTOPERATIVE DIAGNOSES:  Right ureteral stone with hydronephrosis.   PROCEDURE DONE:  1. Cystoscopy.  2. Right retrograde pyelogram.  3. Ureteroscopy with stone extraction.  4. Insertion of double-J catheter.   SURGEON:  Hanley Ben, M.D.   ANESTHESIA:  General.   INDICATION:  The patient is a 55 year old male, who was seen in the  emergency room last night with sudden onset of severe right flank pain,  associated with nausea and vomiting.  He was in the emergency room, and a CT  scan of the abdomen and pelvis showed right perinephric stranding and  hydronephrosis and a 5 mm stone in the right distal ureter.  The patient is  scheduled for cystoscopy and stone manipulation.   DESCRIPTION OF PROCEDURE:  Under general anesthesia, the patient was prepped  and draped and placed in the dorsal lithotomy position.  A #22 Wappler  cystoscope was inserted in the bladder.  The urethra was normal.  There was  no stone or tumor in the bladder.  The ureteral orifices were in normal  position and shape.  A cone-tip catheter was passed through the cystoscope  into the right ureteral orifice.  There was a filling defect in the right  distal ureter with proximal hydronephrosis.  The cone-tip catheter was then  removed.  A Glidewire was passed over an open-end catheter into the right  ureter, and the open-end catheter was advanced over the Glidewire into the  distal ureter.  The Glidewire was then removed.  A guidewire was passed  through the open-end catheter into the ureter.  The open-end catheter was  removed.  The bladder was then emptied and the cystoscope removed.  A #6.5  French rigid ureteroscope was passed in the bladder and into the right  ureter.  The stone was visualized in the distal ureter and with a nitinol  basket, the stone was extracted.  The ureteroscope was removed.  The  guidewire was then backloaded into the cystoscope, and a #6 [place] - 26  double-J catheter was passed over the guidewire.  The proximal coil of the  double-J catheter was in the collecting system.  The distal coil was in the  bladder.  The bladder was then emptied, and the cystoscope and guidewire  were removed.   The patient tolerated the procedure well and left the OR in satisfactory  condition to postanesthesia care unit.  Hanley Ben, M.D.    MN/MEDQ  D:  02/26/2003  T:  02/26/2003  Job:  [id number]

## 2011-05-22 ENCOUNTER — Encounter: Payer: Self-pay | Admitting: Internal Medicine

## 2011-05-22 ENCOUNTER — Telehealth: Payer: Self-pay | Admitting: Internal Medicine

## 2011-05-22 ENCOUNTER — Ambulatory Visit (INDEPENDENT_AMBULATORY_CARE_PROVIDER_SITE_OTHER): Payer: PRIVATE HEALTH INSURANCE | Admitting: Internal Medicine

## 2011-05-22 DIAGNOSIS — I10 Essential (primary) hypertension: Secondary | ICD-10-CM

## 2011-05-22 MED ORDER — FIRST-DUKES MOUTHWASH MT SUSP: OROMUCOSAL | Status: DC

## 2011-05-22 NOTE — Patient Instructions (Signed)
Call or return to clinic prn if these symptoms worsen or fail to improve as anticipated.

## 2011-05-22 NOTE — Progress Notes (Signed)
  Subjective:    Patient ID: Phillip Blair, male    DOB: 10/21/1955, 55 y.o.   MRN: [id number]  HPI 55 year old patient who is seen today with 2 concerns. Approximately 2 weeks go he noted a soft tissue nodule involving his left upper posterior shoulder region. This has been nonpainful. He states that he did not notice this prior to 2 weeks ago. It has been unchanged since this period of time. He has treated hypertension which has been stable. His chief complaint is swelling involving the buccal mucosa. He states this interferes with chewing due to the swelling. He is on no new medications   Review of Systems  Constitutional: Negative for fever, chills, appetite change and fatigue.  HENT: Negative for hearing loss, ear pain, congestion, sore throat, trouble swallowing, neck stiffness, dental problem, voice change and tinnitus.   Eyes: Negative for pain, discharge and visual disturbance.  Respiratory: Negative for cough, chest tightness, wheezing and stridor.   Cardiovascular: Negative for chest pain, palpitations and leg swelling.  Gastrointestinal: Negative for nausea, vomiting, abdominal pain, diarrhea, constipation, blood in stool and abdominal distention.  Genitourinary: Negative for urgency, hematuria, flank pain, discharge, difficulty urinating and genital sores.  Musculoskeletal: Negative for myalgias, back pain, joint swelling, arthralgias and gait problem.  Skin: Positive for rash.  Neurological: Negative for dizziness, syncope, speech difficulty, weakness, numbness and headaches.  Hematological: Negative for adenopathy. Does not bruise/bleed easily.  Psychiatric/Behavioral: Negative for behavioral problems and dysphoric mood. The patient is not nervous/anxious.        Objective:   Physical Exam  Constitutional: He appears well-developed and well-nourished. No distress.       Blood pressure 120/80  HENT:       The patient did have some prominent buccal mucosa posteriorly  near the molar region. This area did not appear to be inflamed or erythematous. Oropharynx was benign. Dentition and gingiva appear normal  Skin:       3 cm freely movable nodule involving the left upper posterior shoulder region. This was consistent with a sebaceous cyst          Assessment & Plan:   Hypertension well controlled. Soft tissue swelling of the buccal mucosa. This does not appear to be inflamed we'll treat empirically with Dukes Magic mouthwash and observe. Sebaceous cyst left shoulder  The patient will be seen for a physical in 6 months. He will call if he does not promptly improve

## 2011-05-22 NOTE — Telephone Encounter (Signed)
Pharmacy needs clarification on prescription for Dukes Magic Mouthwash. Please contact.

## 2011-05-22 NOTE — Telephone Encounter (Signed)
Spoke with [facility] - quanity ?  147mls okay'd

## 2011-08-03 ENCOUNTER — Other Ambulatory Visit (INDEPENDENT_AMBULATORY_CARE_PROVIDER_SITE_OTHER): Payer: PRIVATE HEALTH INSURANCE

## 2011-08-03 DIAGNOSIS — Z0000 Encounter for general adult medical examination without abnormal findings: Secondary | ICD-10-CM

## 2011-08-03 LAB — POCT URINALYSIS DIPSTICK
Spec Grav, UA: 1.025
Urobilinogen, UA: 0.2
pH, UA: 5

## 2011-08-03 LAB — BASIC METABOLIC PANEL
BUN: 23 mg/dL (ref 6–23)
Calcium: 10.4 mg/dL (ref 8.4–10.5)
Creatinine, Ser: 1.5 mg/dL (ref 0.4–1.5)
GFR: 63.82 mL/min (ref >60.00)
Glucose, Bld: 117 mg/dL — ABNORMAL HIGH (ref 70–99)
Sodium: 141 mEq/L (ref 135–145)

## 2011-08-03 LAB — HEPATIC FUNCTION PANEL
Alkaline Phosphatase: 76 U/L (ref 39–117)
Bilirubin, Direct: 0.1 mg/dL (ref 0.0–0.3)

## 2011-08-03 LAB — CBC WITH DIFFERENTIAL/PLATELET
Basophils Absolute: 0 10*3/uL (ref 0.0–0.1)
Hemoglobin: 13.6 g/dL (ref 13.0–17.0)
Lymphocytes Relative: 33.6 % (ref 12.0–46.0)
Monocytes Relative: 8.1 % (ref 3.0–12.0)
Neutrophils Relative %: 50.9 % (ref 43.0–77.0)
Platelets: 181 10*3/uL (ref 150.0–400.0)
RDW: 14.4 % (ref 11.5–14.6)

## 2011-08-03 LAB — LIPID PANEL
HDL: 50.5 mg/dL (ref >39.00)
Triglycerides: 120 mg/dL (ref 0.0–149.0)
VLDL: 24 mg/dL (ref 0.0–40.0)

## 2011-08-03 LAB — PSA: PSA: 0.93 ng/mL (ref 0.10–4.00)

## 2011-08-10 ENCOUNTER — Ambulatory Visit (INDEPENDENT_AMBULATORY_CARE_PROVIDER_SITE_OTHER): Payer: PRIVATE HEALTH INSURANCE | Admitting: Internal Medicine

## 2011-08-10 ENCOUNTER — Encounter: Payer: Self-pay | Admitting: Internal Medicine

## 2011-08-10 VITALS — BP 118/80 | HR 76 | Temp 97.9°F | Resp 18 | Ht 71.0 in | Wt 214.0 lb

## 2011-08-10 DIAGNOSIS — E785 Hyperlipidemia, unspecified: Secondary | ICD-10-CM

## 2011-08-10 DIAGNOSIS — Z87442 Personal history of urinary calculi: Secondary | ICD-10-CM

## 2011-08-10 DIAGNOSIS — K219 Gastro-esophageal reflux disease without esophagitis: Secondary | ICD-10-CM

## 2011-08-10 DIAGNOSIS — M109 Gout, unspecified: Secondary | ICD-10-CM

## 2011-08-10 DIAGNOSIS — Z87448 Personal history of other diseases of urinary system: Secondary | ICD-10-CM

## 2011-08-10 DIAGNOSIS — J309 Allergic rhinitis, unspecified: Secondary | ICD-10-CM

## 2011-08-10 DIAGNOSIS — I10 Essential (primary) hypertension: Secondary | ICD-10-CM

## 2011-08-10 DIAGNOSIS — Z0000 Encounter for general adult medical examination without abnormal findings: Secondary | ICD-10-CM

## 2011-08-10 DIAGNOSIS — Z23 Encounter for immunization: Secondary | ICD-10-CM

## 2011-08-10 LAB — POCT URINALYSIS DIPSTICK
Bilirubin, UA: NEGATIVE
Ketones, UA: NEGATIVE
Leukocytes, UA: NEGATIVE
Nitrite, UA: NEGATIVE

## 2011-08-10 MED ORDER — AMLODIPINE BESYLATE 10 MG PO TABS: 10.0000 mg | ORAL_TABLET | Freq: Every day | ORAL | Status: DC

## 2011-08-10 MED ORDER — ALLOPURINOL 300 MG PO TABS: 300.0000 mg | ORAL_TABLET | Freq: Every day | ORAL | Status: DC

## 2011-08-10 MED ORDER — LISINOPRIL 40 MG PO TABS: 40.0000 mg | ORAL_TABLET | Freq: Every day | ORAL | Status: DC

## 2011-08-10 MED ORDER — FUROSEMIDE 40 MG PO TABS: 40.0000 mg | ORAL_TABLET | Freq: Every day | ORAL | Status: DC | PRN

## 2011-08-10 NOTE — Patient Instructions (Signed)
It is important that you exercise regularly, at least 20 minutes 3 to 4 times per week.  If you develop chest pain or shortness of breath seek  medical attention.  You need to lose weight.  Consider a lower calorie diet and regular exercise.  Please check your blood pressure on a regular basis.  If it is consistently greater than 150/90, please make an office appointment.  Return in 6 months for follow-up  Avoids foods high in acid such as tomatoes citrus juices, and spicy foods.  Avoid eating within two hours of lying down or before exercising.  Do not overheat.  Try smaller more frequent meals.  If symptoms persist, elevate the head of her bed 12 inches while sleeping.

## 2011-08-10 NOTE — Progress Notes (Signed)
Subjective:    Patient ID: Phillip Blair, male    DOB: 1955-12-08, 55 y.o.   MRN: [id number]  HPI  55 year old patient who is seen today for a wellness exam. Medical problems include obstructive sleep apnea he has history of gout that has been stable and also history of mild dyslipidemia. This has improved with better eating habits. He has treated hypertension and a history of allergic rhinitis he has gastroesophageal reflux disease he has remote history of nephrolithiasis.  Past Medical History  Diagnosis Date  . Allergy   . Hypertension   . Hyperlipidemia   . Chronic kidney disease   . GERD (gastroesophageal reflux disease)   . Gout     History   Social History  . Marital Status: Married    Spouse Name: N/A    Number of Children: N/A  . Years of Education: N/A   Occupational History  . Not on file.   Social History Main Topics  . Smoking status: Never Smoker   . Smokeless tobacco: Never Used  . Alcohol Use: No  . Drug Use: No  . Sexually Active: Not on file   Other Topics Concern  . Not on file   Social History Narrative  . No narrative on file    Past Surgical History  Procedure Date  . Vasectomy   . Nasal septoplasty w/ turbinoplasty     No family history on file.  No Known Allergies  Current Outpatient Prescriptions on File Prior to Visit  Medication Sig Dispense Refill  . allopurinol (ZYLOPRIM) 300 MG tablet Take 1 tablet (300 mg total) by mouth daily.  90 tablet  6  . amLODipine (NORVASC) 10 MG tablet Take 1 tablet (10 mg total) by mouth daily.  90 tablet  6  . Diphenhyd-Hydrocort-Nystatin (FIRST-DUKES MOUTHWASH) SUSP 1 teaspoon 4 times daily  1 Bottle  1  . furosemide (LASIX) 40 MG tablet Take 1 tablet (40 mg total) by mouth daily as needed.  90 tablet  6  . lisinopril (PRINIVIL,ZESTRIL) 40 MG tablet Take 1 tablet (40 mg total) by mouth daily.  90 tablet  6  . Naproxen-Esomeprazole (VIMOVO) 500-20 MG TBEC Take by mouth 2 (two) times daily.           BP 118/80  Pulse 76  Temp(Src) 97.9 F (36.6 C) (Oral)  Resp 18  Ht 5\' 11"  (1.803 m)  Wt 214 lb (97.07 kg)  BMI 29.85 kg/m2  SpO2 98%      Current Allergies:  No known allergies   Family History:   father is in his 79  and in good health  details is mother's health unknown  8 brothers and 3 sisters all in good health  no family history of cancer   Social History:  two children,  nonsmoker  Married   Review of Systems  Constitutional: Negative for fever, chills, activity change, appetite change and fatigue.  HENT: Negative for hearing loss, ear pain, congestion, rhinorrhea, sneezing, mouth sores, trouble swallowing, neck pain, neck stiffness, dental problem, voice change, sinus pressure and tinnitus.   Eyes: Negative for photophobia, pain, redness and visual disturbance.  Respiratory: Negative for apnea, cough, choking, chest tightness, shortness of breath and wheezing.   Cardiovascular: Negative for chest pain, palpitations and leg swelling.  Gastrointestinal: Negative for nausea, vomiting, abdominal pain, diarrhea, constipation, blood in stool, abdominal distention, anal bleeding and rectal pain.  Genitourinary: Negative for dysuria, urgency, frequency, hematuria, flank pain, decreased urine volume, discharge, penile swelling, scrotal  swelling, difficulty urinating, genital sores and testicular pain.  Musculoskeletal: Negative for myalgias, back pain, joint swelling, arthralgias and gait problem.  Skin: Negative for color change, rash and wound.  Neurological: Negative for dizziness, tremors, seizures, syncope, facial asymmetry, speech difficulty, weakness, light-headedness, numbness and headaches.  Hematological: Negative for adenopathy. Does not bruise/bleed easily.  Psychiatric/Behavioral: Negative for suicidal ideas, hallucinations, behavioral problems, confusion, sleep disturbance, self-injury, dysphoric mood, decreased concentration and agitation. The patient is  not nervous/anxious.        Objective:   Physical Exam  Constitutional: He appears well-developed and well-nourished.  HENT:  Head: Normocephalic and atraumatic.  Right Ear: External ear normal.  Left Ear: External ear normal.  Nose: Nose normal.  Mouth/Throat: Oropharynx is clear and moist.  Eyes: Conjunctivae and EOM are normal. Pupils are equal, round, and reactive to light. No scleral icterus.  Neck: Normal range of motion. Neck supple. No JVD present. No thyromegaly present.  Cardiovascular: Regular rhythm, normal heart sounds and intact distal pulses.  Exam reveals no gallop and no friction rub.   No murmur heard. Pulmonary/Chest: Effort normal and breath sounds normal. He exhibits no tenderness.  Abdominal: Soft. Bowel sounds are normal. He exhibits no distension and no mass. There is no tenderness.  Genitourinary: Prostate normal and penis normal.  Musculoskeletal: Normal range of motion. He exhibits no edema and no tenderness.  Lymphadenopathy:    He has no cervical adenopathy.  Neurological: He is alert. He has normal reflexes. No cranial nerve deficit. Coordination normal.  Skin: Skin is warm and dry. No rash noted.  Psychiatric: He has a normal mood and affect. His behavior is normal.          Assessment & Plan:    Preventive health exam Hypertension controlled Mild dyslipidemia. Weight loss more exercise encouraged History of gout stable  We'll continue home blood pressure monitoring. For exercise restricted salt diet weight loss encouraged we'll recheck in 6 months Patient had mild hematuria noted on his UA we'll repeat today

## 2011-09-24 ENCOUNTER — Other Ambulatory Visit: Payer: Self-pay | Admitting: Internal Medicine

## 2012-02-05 ENCOUNTER — Ambulatory Visit (INDEPENDENT_AMBULATORY_CARE_PROVIDER_SITE_OTHER): Payer: PRIVATE HEALTH INSURANCE | Admitting: Internal Medicine

## 2012-02-05 ENCOUNTER — Encounter: Payer: Self-pay | Admitting: Internal Medicine

## 2012-02-05 VITALS — BP 120/80 | Temp 98.2°F | Wt 214.0 lb

## 2012-02-05 DIAGNOSIS — M109 Gout, unspecified: Secondary | ICD-10-CM

## 2012-02-05 DIAGNOSIS — I10 Essential (primary) hypertension: Secondary | ICD-10-CM

## 2012-02-05 MED ORDER — FUROSEMIDE 40 MG PO TABS: 40.0000 mg | ORAL_TABLET | Freq: Every day | ORAL | Status: DC

## 2012-02-05 MED ORDER — LISINOPRIL 40 MG PO TABS: 40.0000 mg | ORAL_TABLET | Freq: Every day | ORAL | Status: DC

## 2012-02-05 MED ORDER — ALLOPURINOL 300 MG PO TABS: 300.0000 mg | ORAL_TABLET | Freq: Every day | ORAL | Status: DC

## 2012-02-05 MED ORDER — AMLODIPINE BESYLATE 10 MG PO TABS: 10.0000 mg | ORAL_TABLET | Freq: Every day | ORAL | Status: DC

## 2012-02-05 NOTE — Progress Notes (Signed)
  Subjective:    Patient ID: Phillip Blair, male    DOB: [birth year], 56 y.o.   MRN: [id number]  HPI  Wt Readings from Last 3 Encounters:  02/05/12 214 lb (97.07 kg)  08/10/11 214 lb (97.07 kg)  05/22/11 218 lb (98.884 kg)    Review of Systems     Objective:   Physical Exam        Assessment & Plan:

## 2012-02-05 NOTE — Progress Notes (Signed)
  Subjective:    Patient ID: Phillip Blair, male    DOB: [birth year], 56 y.o.   MRN: [id number]  HPI  56 year old patient seen today for followup of hypertension. He has a history of OSA and mild dyslipidemia. He is doing quite well. No concerns or complaints. His weight is unchanged. He does have some mild allergy related symptoms    Review of Systems  Constitutional: Negative for fever, chills, appetite change and fatigue.  HENT: Positive for congestion, rhinorrhea and postnasal drip. Negative for hearing loss, ear pain, sore throat, trouble swallowing, neck stiffness, dental problem, voice change and tinnitus.   Eyes: Negative for pain, discharge and visual disturbance.  Respiratory: Negative for cough, chest tightness, wheezing and stridor.   Cardiovascular: Negative for chest pain, palpitations and leg swelling.  Gastrointestinal: Negative for nausea, vomiting, abdominal pain, diarrhea, constipation, blood in stool and abdominal distention.  Genitourinary: Negative for urgency, hematuria, flank pain, discharge, difficulty urinating and genital sores.  Musculoskeletal: Negative for myalgias, back pain, joint swelling, arthralgias and gait problem.  Skin: Negative for rash.  Neurological: Negative for dizziness, syncope, speech difficulty, weakness, numbness and headaches.  Hematological: Negative for adenopathy. Does not bruise/bleed easily.  Psychiatric/Behavioral: Negative for behavioral problems and dysphoric mood. The patient is not nervous/anxious.        Objective:   Physical Exam  Constitutional: He is oriented to person, place, and time. He appears well-developed.       Blood pressure 120/80 Weight 214  HENT:  Head: Normocephalic.  Right Ear: External ear normal.  Left Ear: External ear normal.  Eyes: Conjunctivae and EOM are normal.  Neck: Normal range of motion.  Cardiovascular: Normal rate and normal heart sounds.   Pulmonary/Chest: Breath sounds normal.    Abdominal: Bowel sounds are normal.  Musculoskeletal: Normal range of motion. He exhibits no edema and no tenderness.  Neurological: He is alert and oriented to person, place, and time.  Psychiatric: He has a normal mood and affect. His behavior is normal.          Assessment & Plan:   Hypertension. Well controlled we'll continue present regimen. Urethral dispensed Mild obesity. Weight loss exercise all encouraged Obstructive sleep apnea History of gout stable

## 2012-02-05 NOTE — Patient Instructions (Signed)
Limit your sodium (Salt) intake    It is important that you exercise regularly, at least 20 minutes 3 to 4 times per week.  If you develop chest pain or shortness of breath seek  medical attention.  You need to lose weight.  Consider a lower calorie diet and regular exercise.  Please check your blood pressure on a regular basis.  If it is consistently greater than 150/90, please make an office appointment.

## 2012-08-04 ENCOUNTER — Telehealth: Payer: Self-pay | Admitting: Internal Medicine

## 2012-08-04 ENCOUNTER — Other Ambulatory Visit (INDEPENDENT_AMBULATORY_CARE_PROVIDER_SITE_OTHER): Payer: PRIVATE HEALTH INSURANCE

## 2012-08-04 DIAGNOSIS — Z0000 Encounter for general adult medical examination without abnormal findings: Secondary | ICD-10-CM

## 2012-08-04 LAB — CBC WITH DIFFERENTIAL/PLATELET
Basophils Absolute: 0 10*3/uL (ref 0.0–0.1)
Eosinophils Relative: 5.9 % — ABNORMAL HIGH (ref 0.0–5.0)
HCT: 42.1 % (ref 39.0–52.0)
Hemoglobin: 13.6 g/dL (ref 13.0–17.0)
Lymphocytes Relative: 36 % (ref 12.0–46.0)
Lymphs Abs: 2.4 10*3/uL (ref 0.7–4.0)
Monocytes Relative: 10.2 % (ref 3.0–12.0)
Platelets: 188 10*3/uL (ref 150.0–400.0)
RDW: 14.9 % — ABNORMAL HIGH (ref 11.5–14.6)
WBC: 6.8 10*3/uL (ref 4.5–10.5)

## 2012-08-04 LAB — BASIC METABOLIC PANEL
CO2: 25 mEq/L (ref 19–32)
Calcium: 10.2 mg/dL (ref 8.4–10.5)
Chloride: 108 mEq/L (ref 96–112)
Glucose, Bld: 100 mg/dL — ABNORMAL HIGH (ref 70–99)
Sodium: 139 mEq/L (ref 135–145)

## 2012-08-04 LAB — LIPID PANEL
HDL: 50.9 mg/dL (ref >39.00)
Total CHOL/HDL Ratio: 5
Triglycerides: 108 mg/dL (ref 0.0–149.0)
VLDL: 21.6 mg/dL (ref 0.0–40.0)

## 2012-08-04 LAB — HEPATIC FUNCTION PANEL
AST: 21 U/L (ref 0–37)
Albumin: 3.7 g/dL (ref 3.5–5.2)
Total Protein: 6.3 g/dL (ref 6.0–8.3)

## 2012-08-04 LAB — TSH: TSH: 1.68 u[IU]/mL (ref 0.35–5.50)

## 2012-08-04 LAB — PSA: PSA: 0.84 ng/mL (ref 0.10–4.00)

## 2012-08-04 NOTE — Telephone Encounter (Signed)
Wife, Lucita Ferrara calling.  Wanted to leave a message for MD.  Mr. Caballeros has a PE scheduled for next week.  She doesn't want to come to the visit with him, afraid that it will embarrass him.   He has had a dry cough for a long time.  It has become part of his routine.  When she asks him about it he shrugs it off.   He also constantly clears his throat with a clear sputum.  She said that it is worse then the cough.  She said that he "hacks" all the time.  "It is bad and it is disgusting."  She has tried to talk to him about it but he will not take the medication given because it causes drowsiness or he offers another excuse.

## 2012-08-11 ENCOUNTER — Encounter: Payer: Self-pay | Admitting: Internal Medicine

## 2012-08-11 ENCOUNTER — Ambulatory Visit (INDEPENDENT_AMBULATORY_CARE_PROVIDER_SITE_OTHER): Payer: PRIVATE HEALTH INSURANCE | Admitting: Internal Medicine

## 2012-08-11 VITALS — BP 128/84 | HR 72 | Temp 98.0°F | Resp 16 | Ht 71.25 in | Wt 214.0 lb

## 2012-08-11 DIAGNOSIS — N185 Chronic kidney disease, stage 5: Secondary | ICD-10-CM | POA: Insufficient documentation

## 2012-08-11 DIAGNOSIS — Z0000 Encounter for general adult medical examination without abnormal findings: Secondary | ICD-10-CM

## 2012-08-11 DIAGNOSIS — G4733 Obstructive sleep apnea (adult) (pediatric): Secondary | ICD-10-CM

## 2012-08-11 DIAGNOSIS — I10 Essential (primary) hypertension: Secondary | ICD-10-CM

## 2012-08-11 DIAGNOSIS — Z23 Encounter for immunization: Secondary | ICD-10-CM

## 2012-08-11 DIAGNOSIS — N189 Chronic kidney disease, unspecified: Secondary | ICD-10-CM

## 2012-08-11 DIAGNOSIS — Q828 Other specified congenital malformations of skin: Secondary | ICD-10-CM

## 2012-08-11 DIAGNOSIS — J309 Allergic rhinitis, unspecified: Secondary | ICD-10-CM

## 2012-08-11 DIAGNOSIS — K219 Gastro-esophageal reflux disease without esophagitis: Secondary | ICD-10-CM

## 2012-08-11 MED ORDER — TRAMADOL HCL 50 MG PO TABS: 50.0000 mg | ORAL_TABLET | Freq: Three times a day (TID) | ORAL | Status: DC | PRN

## 2012-08-11 MED ORDER — AMLODIPINE BESYLATE 10 MG PO TABS: 10.0000 mg | ORAL_TABLET | Freq: Every day | ORAL | Status: DC

## 2012-08-11 MED ORDER — ALLOPURINOL 300 MG PO TABS: 300.0000 mg | ORAL_TABLET | Freq: Every day | ORAL | Status: DC

## 2012-08-11 MED ORDER — HYDROCHLOROTHIAZIDE 25 MG PO TABS: 25.0000 mg | ORAL_TABLET | Freq: Every day | ORAL | Status: DC

## 2012-08-11 MED ORDER — LISINOPRIL 40 MG PO TABS: 40.0000 mg | ORAL_TABLET | Freq: Every day | ORAL | Status: DC

## 2012-08-11 NOTE — Patient Instructions (Signed)
Limit your sodium (Salt) intake  Return in 6 months for follow-up  Please check your blood pressure on a regular basis.  If it is consistently greater than 150/90, please make an office appointment.    It is important that you exercise regularly, at least 20 minutes 3 to 4 times per week.  If you develop chest pain or shortness of breath seek  medical attention.  Discontinue use of all anti-inflammatory medications such as Aleve Advil ibuprofen naproxen

## 2012-08-11 NOTE — Progress Notes (Signed)
Patient ID: Phillip Blair, male   DOB: 09-08-1956, 56 y.o.   MRN: [id number]  Subjective:    Patient ID: Phillip Blair, male    DOB: March 05, 1956, 56 y.o.   MRN: [id number]  Hypertension Pertinent negatives include no chest pain, headaches, neck pain, palpitations or shortness of breath.  55 year old patient who is seen today for a wellness exam. Medical problems include obstructive sleep apnea;  he has history of gout that has been stable and also history of mild dyslipidemia. This has improved with better eating habits. He has treated hypertension and a history of allergic rhinitis he has gastroesophageal reflux disease;  he has remote history of nephrolithiasis.  The patient does monitor home blood pressure readings and usually stays in a high normal range He does use anti-inflammatory medications occasionally especially for some right hip discomfort  He complains of irritation from skin tags in the right lateral neck area  BP Readings from Last 3 Encounters:  08/11/12 128/84  02/05/12 120/80  08/10/11 118/80    Past Medical History  Diagnosis Date  . Allergy   . Hypertension   . Hyperlipidemia   . Chronic kidney disease   . GERD (gastroesophageal reflux disease)   . Gout     History   Social History  . Marital Status: Married    Spouse Name: N/A    Number of Children: N/A  . Years of Education: N/A   Occupational History  . Not on file.   Social History Main Topics  . Smoking status: Never Smoker   . Smokeless tobacco: Never Used  . Alcohol Use: No  . Drug Use: No  . Sexually Active: Not on file   Other Topics Concern  . Not on file   Social History Narrative  . No narrative on file    Past Surgical History  Procedure Date  . Vasectomy   . Nasal septoplasty w/ turbinoplasty     No family history on file.  No Known Allergies  Current Outpatient Prescriptions on File Prior to Visit  Medication Sig Dispense Refill  . allopurinol (ZYLOPRIM)  300 MG tablet Take 1 tablet (300 mg total) by mouth daily.  90 tablet  6  . amLODipine (NORVASC) 10 MG tablet Take 1 tablet (10 mg total) by mouth daily.  90 tablet  6  . furosemide (LASIX) 40 MG tablet Take 1 tablet (40 mg total) by mouth daily.  90 tablet  6  . lisinopril (PRINIVIL,ZESTRIL) 40 MG tablet Take 1 tablet (40 mg total) by mouth daily.  90 tablet  3  . Naproxen-Esomeprazole (VIMOVO) 500-20 MG TBEC Take by mouth 2 (two) times daily.          BP 128/84  Pulse 72  Temp 98 F (36.7 C) (Oral)  Resp 16  Ht 5' 11.25" (1.81 m)  Wt 214 lb (97.07 kg)  BMI 29.64 kg/m2      Current Allergies:  No known allergies   Family History:   father is in his 32  and in good health  details is mother's health unknown  8 brothers and 3 sisters all in good health  no family history of cancer   Social History:  two children,  nonsmoker  Married   Review of Systems  Constitutional: Negative for fever, chills, activity change, appetite change and fatigue.  HENT: Negative for hearing loss, ear pain, congestion, rhinorrhea, sneezing, mouth sores, trouble swallowing, neck pain, neck stiffness, dental problem, voice change, sinus pressure and tinnitus.  Eyes: Negative for photophobia, pain, redness and visual disturbance.  Respiratory: Negative for apnea, cough, choking, chest tightness, shortness of breath and wheezing.   Cardiovascular: Negative for chest pain, palpitations and leg swelling.  Gastrointestinal: Negative for nausea, vomiting, abdominal pain, diarrhea, constipation, blood in stool, abdominal distention, anal bleeding and rectal pain.  Genitourinary: Negative for dysuria, urgency, frequency, hematuria, flank pain, decreased urine volume, discharge, penile swelling, scrotal swelling, difficulty urinating, genital sores and testicular pain.  Musculoskeletal: Negative for myalgias, back pain, joint swelling, arthralgias and gait problem.  Skin: Negative for color change, rash  and wound.  Neurological: Negative for dizziness, tremors, seizures, syncope, facial asymmetry, speech difficulty, weakness, light-headedness, numbness and headaches.  Hematological: Negative for adenopathy. Does not bruise/bleed easily.  Psychiatric/Behavioral: Negative for suicidal ideas, hallucinations, behavioral problems, confusion, sleep disturbance, self-injury, dysphoric mood, decreased concentration and agitation. The patient is not nervous/anxious.        Objective:   Physical Exam  Constitutional: He appears well-developed and well-nourished.       Blood pressure 150/90  HENT:  Head: Normocephalic and atraumatic.  Right Ear: External ear normal.  Left Ear: External ear normal.  Nose: Nose normal.  Mouth/Throat: Oropharynx is clear and moist.  Eyes: Conjunctivae normal and EOM are normal. Pupils are equal, round, and reactive to light. No scleral icterus.  Neck: Normal range of motion. Neck supple. No JVD present. No thyromegaly present.  Cardiovascular: Regular rhythm, normal heart sounds and intact distal pulses.  Exam reveals no gallop and no friction rub.   No murmur heard. Pulmonary/Chest: Effort normal and breath sounds normal. He exhibits no tenderness.  Abdominal: Soft. Bowel sounds are normal. He exhibits no distension and no mass. There is no tenderness.  Genitourinary: Prostate normal and penis normal.  Musculoskeletal: Normal range of motion. He exhibits no edema and no tenderness.  Lymphadenopathy:    He has no cervical adenopathy.  Neurological: He is alert. He has normal reflexes. No cranial nerve deficit. Coordination normal.  Skin: Skin is warm and dry. No rash noted.       Skin tags noted right lateral neck area  Psychiatric: He has a normal mood and affect. His behavior is normal.          Assessment & Plan:    Preventive health exam Hypertension- unclear control. We'll add low-dose diuretic therapy Mild dyslipidemia. Weight loss more exercise  encouraged History of gout stable  chronic kidney disease- we'll recheck in this is in 6 months and attempt tighter blood pressure control  Skin tags right neck area. After local anesthesia these were removed without difficulty  We'll continue home blood pressure monitoring. For exercise restricted salt diet weight loss encouraged we'll recheck in 6 months Patient had mild hematuria noted on his UA we'll repeat today

## 2012-10-12 LAB — LIPID PANEL
HDL: 49 mg/dL (ref 35–70)
LDL Cholesterol: 181 mg/dL
LDl/HDL Ratio: 3.6

## 2012-10-12 LAB — BASIC METABOLIC PANEL: BUN: 28 mg/dL — AB (ref 4–21)

## 2012-10-12 LAB — HEPATIC FUNCTION PANEL: AST: 16 U/L (ref 14–40)

## 2012-12-08 ENCOUNTER — Other Ambulatory Visit: Payer: Self-pay | Admitting: Internal Medicine

## 2012-12-29 ENCOUNTER — Ambulatory Visit: Payer: Self-pay | Admitting: Family Medicine

## 2013-01-14 ENCOUNTER — Ambulatory Visit (INDEPENDENT_AMBULATORY_CARE_PROVIDER_SITE_OTHER): Payer: PRIVATE HEALTH INSURANCE | Admitting: Family Medicine

## 2013-01-14 ENCOUNTER — Encounter: Payer: Self-pay | Admitting: Family Medicine

## 2013-01-14 VITALS — BP 138/79 | HR 53 | Temp 96.9°F | Ht 72.0 in | Wt 211.0 lb

## 2013-01-14 DIAGNOSIS — R739 Hyperglycemia, unspecified: Secondary | ICD-10-CM | POA: Insufficient documentation

## 2013-01-14 DIAGNOSIS — I10 Essential (primary) hypertension: Secondary | ICD-10-CM

## 2013-01-14 DIAGNOSIS — G4730 Sleep apnea, unspecified: Secondary | ICD-10-CM

## 2013-01-14 DIAGNOSIS — Z87442 Personal history of urinary calculi: Secondary | ICD-10-CM

## 2013-01-14 DIAGNOSIS — E785 Hyperlipidemia, unspecified: Secondary | ICD-10-CM | POA: Insufficient documentation

## 2013-01-14 DIAGNOSIS — K219 Gastro-esophageal reflux disease without esophagitis: Secondary | ICD-10-CM

## 2013-01-14 DIAGNOSIS — G4710 Hypersomnia, unspecified: Secondary | ICD-10-CM

## 2013-01-14 DIAGNOSIS — J309 Allergic rhinitis, unspecified: Secondary | ICD-10-CM

## 2013-01-14 DIAGNOSIS — M109 Gout, unspecified: Secondary | ICD-10-CM

## 2013-01-14 DIAGNOSIS — G4733 Obstructive sleep apnea (adult) (pediatric): Secondary | ICD-10-CM

## 2013-01-14 DIAGNOSIS — N189 Chronic kidney disease, unspecified: Secondary | ICD-10-CM

## 2013-01-14 DIAGNOSIS — R7309 Other abnormal glucose: Secondary | ICD-10-CM

## 2013-01-14 LAB — COMPLETE METABOLIC PANEL WITH GFR
ALT: 24 U/L (ref 0–53)
AST: 20 U/L (ref 0–37)
Albumin: 4.1 g/dL (ref 3.5–5.2)
Alkaline Phosphatase: 71 U/L (ref 39–117)
BUN: 26 mg/dL — ABNORMAL HIGH (ref 6–23)
CO2: 26 mEq/L (ref 19–32)
Calcium: 11.5 mg/dL — ABNORMAL HIGH (ref 8.4–10.5)
Chloride: 107 mEq/L (ref 96–112)
Creat: 1.65 mg/dL — ABNORMAL HIGH (ref 0.50–1.35)
GFR, Est African American: 52 mL/min — ABNORMAL LOW
GFR, Est Non African American: 45 mL/min — ABNORMAL LOW
Glucose, Bld: 93 mg/dL (ref 70–99)
Potassium: 4.4 mEq/L (ref 3.5–5.3)
Sodium: 140 mEq/L (ref 135–145)
Total Bilirubin: 0.7 mg/dL (ref 0.3–1.2)
Total Protein: 6.7 g/dL (ref 6.0–8.3)

## 2013-01-14 LAB — URIC ACID: Uric Acid, Serum: 5 mg/dL (ref 4.0–6.0)

## 2013-01-14 LAB — POCT UA - MICROALBUMIN

## 2013-01-14 LAB — POCT GLYCOSYLATED HEMOGLOBIN (HGB A1C): Hemoglobin A1C: 6.1

## 2013-01-14 MED ORDER — FLUTICASONE PROPIONATE 50 MCG/ACT NA SUSP: 2.0000 | Freq: Every day | NASAL | Status: DC

## 2013-01-14 MED ORDER — LOSARTAN POTASSIUM 100 MG PO TABS: 100.0000 mg | ORAL_TABLET | Freq: Every day | ORAL | Status: DC

## 2013-01-14 NOTE — Patient Instructions (Addendum)
      Dr Paula Libra Recommendations  Diet and Exercise discussed with patient.  For nutrition information, I recommend books:  1).Eat to Live by Dr Excell Seltzer. 2).Prevent and Reverse Heart Disease by Dr Karl Luke. 3) reversing diabetes, by Dr Alyssa Grove  [email or web address]  Exercise recommendations are:  If unable to walk, then the patient can exercise in a chair 3 times a day. By flapping arms like a bird gently and raising legs outwards to the front.  If ambulatory, the patient can go for walks for 30 minutes 3 times a week. Then increase the intensity and duration as tolerated.  Goal is to try to attain exercise frequency to 5 times a week.  If applicable: Best to perform resistance exercises (machines or weights) 2 days a week and cardio type exercises 3 days per week.

## 2013-01-14 NOTE — Progress Notes (Signed)
Patient ID: Phillip Blair, male   DOB: Feb 27, 1956, 57 y.o.   MRN: [id number] SUBJECTIVE: HPI: Came to establish care. Attended [facility] [place]. Patient is here for follow up of hypertension: denies Headache;deniesChest Pain;denies weakness;denies Shortness of Breath or Orthopnea;denies Visual changes;denies palpitations;denies cough;denies pedal edema;denies symptoms of TIA or stroke; admits to Compliance with medications. denies Problems with medications.  Patient is here for follow up of hyperlipidemia: denies Headache;denies Chest Pain;denies weakness;denies Shortness of Breath and orthopnea;denies Visual changes;denies palpitations;denies cough;denies pedal edema;denies symptoms of TIA or stroke;deniesClaudication symptoms. admits to Compliance with medications; denies Problems with medications.  Diet has not been consistent with the changes needed to effect change in his medical problems. Exercise has been minimal or none.  PMH/PSH: reviewed/updated in Epic  SH/FH: reviewed/updated in Epic  Allergies: reviewed/updated in Epic  Medications: reviewed/updated in Epic  Immunizations: reviewed/updated in Epic  ROS: As above in the HPI. All other systems are stable or negative.  OBJECTIVE: APPEARANCE:  Phillip Blair Male Patient in no acute distress.The patient appeared well nourished and normally developed. Acyanotic. Waist:42.25 inches VITAL SIGNS:BP 138/79  Pulse 53  Temp(Src) 96.9 F (36.1 C) (Oral)  Ht 6' (1.829 m)  Wt 211 lb (95.709 kg)  BMI 28.61 kg/m2   SKIN: warm and  Dry without overt rashes, tattoos and scars  HEAD and Neck: without JVD, Head and scalp: normal Eyes:No scleral icterus. Fundi normal, eye movements normal. Ears: Auricle normal, canal normal, Tympanic membranes normal, insufflation normal. Nose: normal Throat: normal Neck & thyroid: normal  CHEST & LUNGS: Chest wall: normal Lungs: Clear  CVS: Reveals the PMI to be normally  located. Regular rhythm, First and Second Heart sounds are normal,  absence of murmurs, rubs or gallops. Peripheral vasculature: Radial pulses: normal Dorsal pedis pulses: normal Posterior pulses: normal  ABDOMEN:  Appearance:ObeseBenign,, no organomegaly, no masses, no Abdominal Aortic enlargement. No Guarding , no rebound. No Bruits. Bowel sounds: normal  EXTREMETIES: nonedematous. Both Femoral and Pedal pulses are normal.  MUSCULOSKELETAL:  Spine: normal Joints: intact  NEUROLOGIC: oriented to time,place and person; nonfocal. Strength is normal Sensory is normal Reflexes are normal Cranial Nerves are normal.  Labs, reviewed and in chart by nurse.Reviewed with patient.  ASSESSMENT: ALLERGIC RHINITIS - Plan: fluticasone (FLONASE) 50 MCG/ACT nasal spray  Chronic kidney disease - Plan: POCT UA - Microalbumin  GERD  GOUT - Plan: Uric acid  HYPERLIPIDEMIA  HYPERSOMNIA, ASSOCIATED WITH SLEEP APNEA  HYPERTENSION - Plan: losartan (COZAAR) 100 MG tablet  NEPHROLITHIASIS, HX OF  SLEEP APNEA, OBSTRUCTIVE, SEVERE  HLD (hyperlipidemia) - Plan: COMPLETE METABOLIC PANEL WITH GFR, NMR Lipoprofile with Lipids  Hyperglycemia - Plan: POCT glycosylated hemoglobin (Hb A1C), POCT UA - Microalbumin  Cough secondary to the ACE. Will change to an ARB.  PLAN:  Spent at least 45 minutes discussing his labs with him.And explaining and counselling on nutrition and exercise.  Orders Placed This Encounter  Procedures  . Basic metabolic panel    This external order was created through the Results Console.  . Basic metabolic panel    This external order was created through the Results Console.  . Lipid panel    This external order was created through the Results Console.  . Hepatic function panel    This external order was created through the Results Console.  . Hemoglobin A1c    This external order was created through the Results Console.  . COMPLETE METABOLIC PANEL WITH GFR   . NMR Lipoprofile with Lipids  .  Uric acid  . POCT glycosylated hemoglobin (Hb A1C)  . POCT UA - Microalbumin   Results for orders placed in visit on 01/14/13 (from the past 24 hour(s))  POCT UA - MICROALBUMIN     Status: None   Collection Time    01/14/13  2:28 PM      Result Value Range   Microalbumin Ur, POC 50mg \l    POCT GLYCOSYLATED HEMOGLOBIN (HGB A1C)     Status: None   Collection Time    01/14/13  2:32 PM      Result Value Range   Hemoglobin A1C 6.1     Meds ordered this encounter  Medications  . losartan (COZAAR) 100 MG tablet    Sig: Take 1 tablet (100 mg total) by mouth daily.    Dispense:  90 tablet    Refill:  2  . fluticasone (FLONASE) 50 MCG/ACT nasal spray    Sig: Place 2 sprays into the nose daily.    Dispense:  48 g    Refill:  1       Dr Paula Libra Recommendations  Diet and Exercise discussed with patient.  For nutrition information, I recommend books:  1).Eat to Live by Dr Excell Seltzer. 2).Prevent and Reverse Heart Disease by Dr Karl Luke. 3) reversing diabetes, by Dr Alyssa Grove  Www.PCRM.org  Exercise recommendations are:  If unable to walk, then the patient can exercise in a chair 3 times a day. By flapping arms like a bird gently and raising legs outwards to the front.  If ambulatory, the patient can go for walks for 30 minutes 3 times a week. Then increase the intensity and duration as tolerated.  Goal is to try to attain exercise frequency to 5 times a week.  If applicable: Best to perform resistance exercises (machines or weights) 2 days a week and cardio type exercises 3 days per week.  RTC 6 weeks  Nycholas Rayner P. Jacelyn Grip, M.D.

## 2013-01-15 LAB — NMR LIPOPROFILE WITH LIPIDS
Cholesterol, Total: 255 mg/dL — ABNORMAL HIGH (ref <200)
HDL Particle Number: 36.3 umol/L (ref >=30.5)
HDL Size: 8.6 nm — ABNORMAL LOW (ref >=9.2)
HDL-C: 51 mg/dL (ref >=40)
LDL (calc): 167 mg/dL — ABNORMAL HIGH (ref <100)
LDL Particle Number: 2813 nmol/L — ABNORMAL HIGH (ref <1000)
LDL Size: 20.5 nm — ABNORMAL LOW (ref >20.5)
LP-IR Score: 73 — ABNORMAL HIGH (ref <=45)
Large HDL-P: 3.5 umol/L — ABNORMAL LOW (ref >=4.8)
Large VLDL-P: 2.6 nmol/L (ref <=2.7)
Small LDL Particle Number: 1686 nmol/L — ABNORMAL HIGH (ref <=527)
Triglycerides: 185 mg/dL — ABNORMAL HIGH (ref <150)
VLDL Size: 50.5 nm — ABNORMAL HIGH (ref <=46.6)

## 2013-02-09 ENCOUNTER — Ambulatory Visit: Payer: PRIVATE HEALTH INSURANCE | Admitting: Internal Medicine

## 2013-02-25 ENCOUNTER — Ambulatory Visit (INDEPENDENT_AMBULATORY_CARE_PROVIDER_SITE_OTHER): Payer: PRIVATE HEALTH INSURANCE | Admitting: Family Medicine

## 2013-02-25 ENCOUNTER — Encounter: Payer: Self-pay | Admitting: Family Medicine

## 2013-02-25 ENCOUNTER — Ambulatory Visit (INDEPENDENT_AMBULATORY_CARE_PROVIDER_SITE_OTHER): Payer: PRIVATE HEALTH INSURANCE

## 2013-02-25 VITALS — BP 131/75 | HR 43 | Temp 97.0°F | Ht 71.0 in | Wt 198.0 lb

## 2013-02-25 DIAGNOSIS — M25512 Pain in left shoulder: Secondary | ICD-10-CM

## 2013-02-25 DIAGNOSIS — M25519 Pain in unspecified shoulder: Secondary | ICD-10-CM | POA: Insufficient documentation

## 2013-02-25 DIAGNOSIS — E785 Hyperlipidemia, unspecified: Secondary | ICD-10-CM

## 2013-02-25 DIAGNOSIS — R7309 Other abnormal glucose: Secondary | ICD-10-CM

## 2013-02-25 DIAGNOSIS — M719 Bursopathy, unspecified: Secondary | ICD-10-CM

## 2013-02-25 DIAGNOSIS — M109 Gout, unspecified: Secondary | ICD-10-CM

## 2013-02-25 DIAGNOSIS — R739 Hyperglycemia, unspecified: Secondary | ICD-10-CM

## 2013-02-25 DIAGNOSIS — M778 Other enthesopathies, not elsewhere classified: Secondary | ICD-10-CM | POA: Insufficient documentation

## 2013-02-25 DIAGNOSIS — G4733 Obstructive sleep apnea (adult) (pediatric): Secondary | ICD-10-CM

## 2013-02-25 DIAGNOSIS — N189 Chronic kidney disease, unspecified: Secondary | ICD-10-CM

## 2013-02-25 DIAGNOSIS — K219 Gastro-esophageal reflux disease without esophagitis: Secondary | ICD-10-CM

## 2013-02-25 DIAGNOSIS — J309 Allergic rhinitis, unspecified: Secondary | ICD-10-CM

## 2013-02-25 DIAGNOSIS — M67919 Unspecified disorder of synovium and tendon, unspecified shoulder: Secondary | ICD-10-CM

## 2013-02-25 LAB — CALCIUM, IONIZED: Calcium, Ion: 1.54 mmol/L — ABNORMAL HIGH (ref 1.12–1.32)

## 2013-02-25 NOTE — Progress Notes (Signed)
Patient ID: Phillip Blair, male   DOB: Nov 02, 1955, 57 y.o.   MRN: [id number] SUBJECTIVE: Chief Complaint  Patient presents with  . review labs    6 week rck  . Shoulder Pain    left   HPI: 1)Patient is here for follow up of hypertension/gout/OSA/HLD/CKD/hyperglycemia:  denies Headache;deniesChest Pain;denies weakness;denies Shortness of Breath or Orthopnea;denies Visual changes;denies palpitations;denies cough;denies pedal edema;denies symptoms of TIA or stroke; admits to Compliance with medications. denies Problems with medications.  2) Shoulder pain: Left shoulder was working under the sink fixing it for hours. Cannot rotate without pain without pain  PMH/PSH: reviewed/updated in Epic  SH/FH: reviewed/updated in Epic  Allergies: reviewed/updated in Epic  Medications: reviewed/updated in Epic  Immunizations: reviewed/updated in Epic  ROS: As above in the HPI. All other systems are stable or negative.  OBJECTIVE: APPEARANCE:  [name] Patient in no acute distress.The patient appeared well nourished and normally developed. Acyanotic. Waist: VITAL SIGNS:BP 131/75  Pulse 43  Temp(Src) 97 F (36.1 C) (Oral)  Ht 5\' 11"  (1.803 m)  Wt 198 lb (89.812 kg)  BMI 27.63 kg/m2   SKIN: warm and  Dry without overt rashes, tattoos and scars  HEAD and Neck: without JVD, Head and scalp: normal Eyes:No scleral icterus. Fundi normal, eye movements normal. Ears: Auricle normal, canal normal, Tympanic membranes normal, insufflation normal. Nose: normal Throat: normal Neck & thyroid: normal  CHEST & LUNGS: Chest wall: normal Lungs: Clear  CVS: Reveals the PMI to be normally located. Regular rhythm, First and Second Heart sounds are normal,  absence of murmurs, rubs or gallops. Peripheral vasculature: Radial pulses: normal Dorsal pedis pulses: normal Posterior pulses: normal  ABDOMEN:  Appearance: obese Benign, no organomegaly, no masses, no Abdominal Aortic  enlargement. No Guarding , no rebound. No Bruits. Bowel sounds: normal  RECTAL: N/A GU: N/A  EXTREMETIES: nonedematous. Both Femoral and Pedal pulses are normal.  MUSCULOSKELETAL:  Spine: normal Joints:left shoulder painful internal rotation and mild painfull arc on exam.  NEUROLOGIC: oriented to time,place and person; nonfocal. Strength is normal Sensory is normal Reflexes are normal Cranial Nerves are normal. Results for orders placed in visit on [id number]  BASIC METABOLIC PANEL      Result Value Range   Glucose 111     BUN 28 (*) 4 - 21 mg/dL  BASIC METABOLIC PANEL      Result Value Range   Creatinine 1.6 (*) 0.6 - 1.3 mg/dL  LIPID PANEL      Result Value Range   LDl/HDL Ratio 3.6     Triglycerides 123  40 - 160 mg/dL   Cholesterol 256 (*) 0 - 200 mg/dL   HDL 49  35 - 70 mg/dL   LDL Cholesterol 181    HEPATIC FUNCTION PANEL      Result Value Range   Alkaline Phosphatase 77  25 - 125 U/L   ALT 22  10 - 40 U/L   AST 16  14 - 40 U/L   Bilirubin, Total 0.6    HEMOGLOBIN A1C      Result Value Range   Hemoglobin A1C 6.4 (*) 4.0 - 6.0 %  COMPLETE METABOLIC PANEL WITH GFR      Result Value Range   Sodium 140  135 - 145 mEq/L   Potassium 4.4  3.5 - 5.3 mEq/L   Chloride 107  96 - 112 mEq/L   CO2 26  19 - 32 mEq/L   Glucose, Bld 93  70 - 99 mg/dL  BUN 26 (*) 6 - 23 mg/dL   Creat 1.65 (*) 0.50 - 1.35 mg/dL   Total Bilirubin 0.7  0.3 - 1.2 mg/dL   Alkaline Phosphatase 71  39 - 117 U/L   AST 20  0 - 37 U/L   ALT 24  0 - 53 U/L   Total Protein 6.7  6.0 - 8.3 g/dL   Albumin 4.1  3.5 - 5.2 g/dL   Calcium 11.5 (*) 8.4 - 10.5 mg/dL   GFR, Est African American 52 (*)    GFR, Est Non African American 45 (*)   NMR LIPOPROFILE WITH LIPIDS      Result Value Range   LDL Particle Number [id number] (*) <1000 nmol/L   LDL (calc) 167 (*) <100 mg/dL   HDL-C 51  >=40 mg/dL   Triglycerides 185 (*) <150 mg/dL   Cholesterol, Total 255 (*) <200 mg/dL   HDL Particle Number [id number]  >=30.5  umol/L   Large HDL-P 3.5 (*) >=4.8 umol/L   Large VLDL-P 2.6  <=2.7 nmol/L   Small LDL Particle Number [id number] (*) <=527 nmol/L   LDL Size 20.5 (*) >20.5 nm   HDL Size 8.6 (*) >=9.2 nm   VLDL Size 50.5 (*) <=46.6 nm   LP-IR Score 73 (*) <=45  URIC ACID      Result Value Range   Uric Acid, Serum 5.0  4.0 - 6.0 mg/dL  POCT GLYCOSYLATED HEMOGLOBIN (HGB A1C)      Result Value Range   Hemoglobin A1C 6.1    POCT UA - MICROALBUMIN      Result Value Range   Microalbumin Ur, POC 50mg \l      ASSESSMENT: Pain in joint, shoulder region, left - Plan: DG Shoulder Left  Tendonitis of shoulder, left  Hypercalcemia - Plan: Calcium, ionized, PTH, Intact and Calcium  SLEEP APNEA, OBSTRUCTIVE, SEVERE  Hyperglycemia  GOUT  GERD  HLD (hyperlipidemia)  Chronic kidney disease, unspecified stage  ALLERGIC RHINITIS    PLAN: Orders Placed This Encounter  Procedures  . DG Shoulder Left    Standing Status: Future     Number of Occurrences: 1     Standing Expiration Date: 04/27/2014    Order Specific Question:  Reason for Exam (SYMPTOM  OR DIAGNOSIS REQUIRED)    Answer:  left shoulder pain    Order Specific Question:  Preferred imaging location?    Answer:  Internal  . Calcium, ionized  . PTH, Intact and Calcium    [facility] reading (PRIMARY) by  Dr.Labresha Mellor:     No acute findings.                           No orders of the defined types were placed in this encounter.   Reviewed labs with patient. Further w/u for the hypercalcemia. Praised patient on his weight loss and beginning lifestyle changes.  Return in about 2 months (around 04/27/2013) for Recheck medical problems.  Yanet Balliet P. Jacelyn Grip, M.D.

## 2013-02-26 LAB — PTH, INTACT AND CALCIUM
Calcium: 10.9 mg/dL — ABNORMAL HIGH (ref 8.4–10.5)
PTH: 165.2 pg/mL — ABNORMAL HIGH (ref 14.0–72.0)

## 2013-03-02 ENCOUNTER — Other Ambulatory Visit: Payer: Self-pay | Admitting: Family Medicine

## 2013-03-02 DIAGNOSIS — E213 Hyperparathyroidism, unspecified: Secondary | ICD-10-CM

## 2013-03-02 NOTE — Progress Notes (Signed)
Quick Note:  Labs abnormal.calcium and PTH elevated. Discussed with patient.  Needs sestamibi scan followed by endocrine and surgical consult as appropriate.  Kyree Fedorko P. Jacelyn Grip, M.D.  ______

## 2013-03-13 ENCOUNTER — Other Ambulatory Visit: Payer: Self-pay | Admitting: Family Medicine

## 2013-03-13 ENCOUNTER — Encounter (HOSPITAL_COMMUNITY): Payer: Self-pay

## 2013-03-13 ENCOUNTER — Encounter (HOSPITAL_COMMUNITY)
Admission: RE | Admit: 2013-03-13 | Discharge: 2013-03-13 | Disposition: A | Discharge status: Home / Self Care | Payer: PRIVATE HEALTH INSURANCE | Source: Ambulatory Visit | Attending: Family Medicine | Admitting: Family Medicine

## 2013-03-13 ENCOUNTER — Encounter: Payer: Self-pay | Admitting: Family Medicine

## 2013-03-13 DIAGNOSIS — D351 Benign neoplasm of parathyroid gland: Secondary | ICD-10-CM

## 2013-03-13 DIAGNOSIS — E213 Hyperparathyroidism, unspecified: Secondary | ICD-10-CM | POA: Insufficient documentation

## 2013-03-13 MED ORDER — TECHNETIUM TC 99M SESTAMIBI - CARDIOLITE
25.6000 | Freq: Once | INTRAVENOUS | Status: AC | PRN
  Administered 2013-03-13: 25.6 via INTRAVENOUS

## 2013-03-18 ENCOUNTER — Encounter (INDEPENDENT_AMBULATORY_CARE_PROVIDER_SITE_OTHER): Payer: Self-pay | Admitting: Surgery

## 2013-03-31 ENCOUNTER — Encounter (INDEPENDENT_AMBULATORY_CARE_PROVIDER_SITE_OTHER): Payer: Self-pay | Admitting: Surgery

## 2013-03-31 ENCOUNTER — Ambulatory Visit (INDEPENDENT_AMBULATORY_CARE_PROVIDER_SITE_OTHER): Payer: PRIVATE HEALTH INSURANCE | Admitting: Surgery

## 2013-03-31 VITALS — BP 122/72 | HR 77 | Temp 97.7°F | Resp 12 | Ht 72.0 in | Wt 194.6 lb

## 2013-03-31 DIAGNOSIS — D351 Benign neoplasm of parathyroid gland: Secondary | ICD-10-CM

## 2013-03-31 NOTE — Progress Notes (Signed)
Patient ID: Phillip Blair, male   DOB: [birth year], 57 y.o.   MRN: [id number]  Chief Complaint  Patient presents with  . Thyroid Problem    HPI Phillip Blair is a 57 y.o. male.   HPI This is a very pleasant gentleman referred by Dr. Jacelyn Grip for evaluation of a potential parathyroid adenoma. He was found recently a laboratory data to have an elevated calcium to as high as 11.5. His followup parathyroid hormone level was 173.  He went on to have a sestamibi scan which was suspicious for an inferior right parathyroid adenoma. He has had kidney stones in the past but is otherwise asymptomatic Past Medical History  Diagnosis Date  . Allergy   . Hypertension   . Hyperlipidemia   . Chronic kidney disease   . GERD (gastroesophageal reflux disease)   . Gout   . Sleep apnea     Past Surgical History  Procedure Laterality Date  . Vasectomy    . Nasal septoplasty w/ turbinoplasty      History reviewed. No pertinent family history.  Social History History  Substance Use Topics  . Smoking status: Never Smoker   . Smokeless tobacco: Never Used  . Alcohol Use: No    No Known Allergies  Current Outpatient Prescriptions  Medication Sig Dispense Refill  . allopurinol (ZYLOPRIM) 300 MG tablet Take 1 tablet (300 mg total) by mouth daily.  90 tablet  6  . amLODipine (NORVASC) 10 MG tablet Take 1 tablet (10 mg total) by mouth daily.  90 tablet  6  . fluticasone (FLONASE) 50 MCG/ACT nasal spray Place 2 sprays into the nose daily.  48 g  1  . hydrochlorothiazide (HYDRODIURIL) 25 MG tablet Take 1 tablet (25 mg total) by mouth daily.  90 tablet  3  . losartan (COZAAR) 100 MG tablet Take 1 tablet (100 mg total) by mouth daily.  90 tablet  2  . traMADol (ULTRAM) 50 MG tablet Take 1 tablet (50 mg total) by mouth every 8 (eight) hours as needed for pain.  30 tablet  0   No current facility-administered medications for this visit.    Review of Systems Review of Systems  Constitutional:  Negative for fever, chills and unexpected weight change.  HENT: Negative for hearing loss, congestion, sore throat, trouble swallowing and voice change.   Eyes: Negative for visual disturbance.  Respiratory: Negative for cough and wheezing.   Cardiovascular: Negative for chest pain, palpitations and leg swelling.  Gastrointestinal: Negative for nausea, vomiting, abdominal pain, diarrhea, constipation, blood in stool, abdominal distention, anal bleeding and rectal pain.  Genitourinary: Negative for hematuria and difficulty urinating.  Musculoskeletal: Negative for arthralgias.  Skin: Negative for rash and wound.  Neurological: Negative for seizures, syncope, weakness and headaches.  Hematological: Negative for adenopathy. Does not bruise/bleed easily.  Psychiatric/Behavioral: Negative for confusion.    Blood pressure 122/72, pulse 77, temperature 97.7 F (36.5 C), temperature source Temporal, resp. rate 12, height 6' (1.829 m), weight 194 lb 9.6 oz (88.27 kg).  Physical Exam Physical Exam  Constitutional: He is oriented to person, place, and time. He appears well-developed and well-nourished. No distress.  HENT:  Head: Normocephalic.  Right Ear: External ear normal.  Left Ear: External ear normal.  Nose: Nose normal.  Mouth/Throat: Oropharynx is clear and moist. No oropharyngeal exudate.  Eyes: Conjunctivae and EOM are normal. Pupils are equal, round, and reactive to light. Right eye exhibits no discharge. Left eye exhibits no discharge. No scleral icterus.  Neck: Normal range of motion. Neck supple. No tracheal deviation present. No thyromegaly present.  Cardiovascular: Normal rate, regular rhythm, normal heart sounds and intact distal pulses.   No murmur heard. Pulmonary/Chest: Effort normal and breath sounds normal. No respiratory distress. He has no wheezes. He has no rales.  Musculoskeletal: Normal range of motion. He exhibits no edema and no tenderness.  Lymphadenopathy:    He  has no cervical adenopathy.  Neurological: He is alert and oriented to person, place, and time.  Skin: Skin is warm and dry. No rash noted. He is not diaphoretic. No erythema.  Psychiatric: His behavior is normal. Judgment normal.    Data Reviewed I have reviewed his notes, laboratory data, and sestamibi scan  Assessment    Parathyroid adenoma     Plan    Minimally invasive parathyroidectomy was recommended. I have discussed this with the patient and his wife in detail. I discussed the risks of surgery which includes but is not limited to bleeding, infection, recurrent laryngeal nerve injury, need for a complete neck exploration showed a gland be difficult to find, postop hypocalcemia, et Ronney Asters. They understand and he wishes to proceed. Surgery will be scheduled        Tecumseh Yeagley A 03/31/2013, 11:40 AM

## 2013-04-02 ENCOUNTER — Encounter: Payer: Self-pay | Admitting: Family Medicine

## 2013-04-02 ENCOUNTER — Ambulatory Visit (INDEPENDENT_AMBULATORY_CARE_PROVIDER_SITE_OTHER): Payer: PRIVATE HEALTH INSURANCE

## 2013-04-02 ENCOUNTER — Ambulatory Visit (INDEPENDENT_AMBULATORY_CARE_PROVIDER_SITE_OTHER): Payer: PRIVATE HEALTH INSURANCE | Admitting: Family Medicine

## 2013-04-02 VITALS — BP 132/80 | HR 45 | Temp 98.1°F | Wt 192.8 lb

## 2013-04-02 DIAGNOSIS — Z119 Encounter for screening for infectious and parasitic diseases, unspecified: Secondary | ICD-10-CM

## 2013-04-02 DIAGNOSIS — Z021 Encounter for pre-employment examination: Secondary | ICD-10-CM

## 2013-04-02 DIAGNOSIS — Z0289 Encounter for other administrative examinations: Secondary | ICD-10-CM

## 2013-04-02 LAB — POCT URINALYSIS DIPSTICK
Bilirubin, UA: NEGATIVE
Glucose, UA: NEGATIVE
Ketones, UA: NEGATIVE
Leukocytes, UA: NEGATIVE
Nitrite, UA: NEGATIVE
Protein, UA: NEGATIVE
Spec Grav, UA: <=1.005
Urobilinogen, UA: NEGATIVE
pH, UA: 7

## 2013-04-02 LAB — RPR

## 2013-04-02 LAB — HIV ANTIBODY (ROUTINE TESTING W REFLEX): HIV: NONREACTIVE

## 2013-04-02 NOTE — Progress Notes (Signed)
Patient ID: Phillip Blair, male   DOB: 08-23-1956, 57 y.o.   MRN: [id number] SUBJECTIVE: CC: Chief Complaint  Patient presents with  . Follow-up    discuss labs    HPI: Really here for labs and  Exam to work on a  Project in [place]. No changes , no complaints  Today. Changed diet losing weight doing better. His medical problems are stable.  Past Medical History  Diagnosis Date  . Allergy   . Hypertension   . Hyperlipidemia   . Chronic kidney disease   . GERD (gastroesophageal reflux disease)   . Gout   . Sleep apnea    Past Surgical History  Procedure Laterality Date  . Vasectomy    . Nasal septoplasty w/ turbinoplasty     History   Social History  . Marital Status: Married    Spouse Name: N/A    Number of Children: N/A  . Years of Education: N/A   Occupational History  . Not on file.   Social History Main Topics  . Smoking status: Never Smoker   . Smokeless tobacco: Never Used  . Alcohol Use: No  . Drug Use: No  . Sexually Active: Not on file   Other Topics Concern  . Not on file   Social History Narrative  . No narrative on file   No family history on file. Current Outpatient Prescriptions on File Prior to Visit  Medication Sig Dispense Refill  . allopurinol (ZYLOPRIM) 300 MG tablet Take 1 tablet (300 mg total) by mouth daily.  90 tablet  6  . amLODipine (NORVASC) 10 MG tablet Take 1 tablet (10 mg total) by mouth daily.  90 tablet  6  . fluticasone (FLONASE) 50 MCG/ACT nasal spray Place 2 sprays into the nose daily.  48 g  1  . hydrochlorothiazide (HYDRODIURIL) 25 MG tablet Take 1 tablet (25 mg total) by mouth daily.  90 tablet  3  . losartan (COZAAR) 100 MG tablet Take 1 tablet (100 mg total) by mouth daily.  90 tablet  2  . traMADol (ULTRAM) 50 MG tablet Take 1 tablet (50 mg total) by mouth every 8 (eight) hours as needed for pain.  30 tablet  0   No current facility-administered medications on file prior to visit.   No Known  Allergies Immunization History  Administered Date(s) Administered  . Influenza Split 07/09/2011, 08/11/2012  . Influenza Whole 07/26/2010  . Tdap 08/10/2011   Prior to Admission medications   Medication Sig Start Date End Date Taking? Authorizing Provider  allopurinol (ZYLOPRIM) 300 MG tablet Take 1 tablet (300 mg total) by mouth daily. 08/11/12  Yes Marletta Lor, MD  amLODipine (NORVASC) 10 MG tablet Take 1 tablet (10 mg total) by mouth daily. 08/11/12  Yes Marletta Lor, MD  fluticasone [facility]) 50 MCG/ACT nasal spray Place 2 sprays into the nose daily. 01/14/13  Yes [name], MD  hydrochlorothiazide (HYDRODIURIL) 25 MG tablet Take 1 tablet (25 mg total) by mouth daily. 08/11/12  Yes Marletta Lor, MD  losartan (COZAAR) 100 MG tablet Take 1 tablet (100 mg total) by mouth daily. 01/14/13  Yes [name], MD  traMADol (ULTRAM) 50 MG tablet Take 1 tablet (50 mg total) by mouth every 8 (eight) hours as needed for pain. 08/11/12  Yes Marletta Lor, MD     ROS: As above in the HPI. All other systems are stable or negative.  OBJECTIVE: APPEARANCE:  Patient in no acute  distress.The patient appeared well nourished and normally developed. Acyanotic. Waist: VITAL SIGNS:BP 132/80  Pulse 45  Temp(Src) 98.1 F (36.7 C) (Oral)  Wt 192 lb 12.8 oz (87.454 kg)  BMI 26.14 kg/m2 Afro-[place] Male  SKIN: warm and  Dry without overt rashes, tattoos and scars  HEAD and Neck: without JVD, Head and scalp: normal Eyes:No scleral icterus. Fundi normal, eye movements normal. Ears: Auricle normal, canal normal, Tympanic membranes normal, insufflation normal. Nose: normal Throat: normal Neck & thyroid: normal  CHEST & LUNGS: Chest wall: normal Lungs: Clear  CVS: Reveals the PMI to be normally located. Regular rhythm, First and Second Heart sounds are normal,  absence of murmurs, rubs or gallops. Peripheral vasculature: Radial pulses: normal Dorsal pedis  pulses: normal Posterior pulses: normal  ABDOMEN:  Appearance:mild central obese. Benign, no organomegaly, no masses, no Abdominal Aortic enlargement. No Guarding , no rebound. No Bruits. Bowel sounds: normal  RECTAL: N/A GU:NAD  EXTREMETIES: nonedematous. Both Femoral and Pedal pulses are normal.  MUSCULOSKELETAL:  Spine: normal Joints: intact  NEUROLOGIC: oriented to time,place and person; nonfocal. Strength is normal Sensory is normal Reflexes are normal Cranial Nerves are normal.  Results for orders placed in visit on 04/02/13  POCT URINALYSIS DIPSTICK      Result Value Range   Color, UA yellow     Clarity, UA clear     Glucose, UA neg     Bilirubin, UA neg     Ketones, UA neg     Spec Grav, UA <=1.005     Blood, UA trace     pH, UA 7.0     Protein, UA neg     Urobilinogen, UA negative     Nitrite, UA neg     Leukocytes, UA Negative      ASSESSMENT: Physical exam, pre-employment  Screening examination for infectious disease - Plan: POCT urinalysis dipstick, HIV antibody, RPR, DG Chest 2 View  PLAN:  Orders Placed This Encounter  Procedures  . DG Chest 2 View    Standing Status: Future     Number of Occurrences:      Standing Expiration Date: 06/02/2014    Order Specific Question:  Reason for Exam (SYMPTOM  OR DIAGNOSIS REQUIRED)    Answer:  Tb screen for travel visa    Order Specific Question:  Preferred imaging location?    Answer:  Internal  . HIV antibody  . RPR  . POCT urinalysis dipstick   [facility] reading (PRIMARY) by  Dr. Jacelyn Grip:  Kizzie Ide: screening to r/o Tb for travel/work. [place]: no acute disease seen.  No Follow-up on file.  Forms to be filled pending labs; for [employer].  Lynore Coscia P. Jacelyn Grip, M.D.

## 2013-04-05 NOTE — Progress Notes (Signed)
Quick Note:  Call patient. Labs normal. No change in plan. ______ 

## 2013-04-17 ENCOUNTER — Encounter (HOSPITAL_COMMUNITY): Payer: Self-pay | Admitting: Pharmacy Technician

## 2013-04-21 ENCOUNTER — Encounter (INDEPENDENT_AMBULATORY_CARE_PROVIDER_SITE_OTHER): Payer: Self-pay | Admitting: General Surgery

## 2013-04-22 MED ORDER — LIDOCAINE-EPINEPHRINE 1 %-1:100000 IJ SOLN
INTRAMUSCULAR | Status: AC
  Filled 2013-04-22: qty 1

## 2013-04-23 NOTE — Pre-Procedure Instructions (Signed)
Phillip Blair  04/23/2013   Your procedure is scheduled on:  Mon, Aug 11 @ 7:30 AM  Report to [place] at 5:30 AM.  Call this number if you have problems the morning of surgery: [phone]   Remember:   Do not eat food or drink liquids after midnight.   Take these medicines the morning of surgery with A SIP OF WATER: Allopurinol(Zyloprim) and Amlodipine(Norvasc)              No Goody's,BC's,Aleve,Aspirin,Ibuprofen,Fish Oil,or any Herbal Medications   Do not wear jewelry  Do not wear lotions, powders, or colognes. You may wear deodorant.  Men may shave face and neck.  Do not bring valuables to the hospital.  [facility] is not responsible                   for any belongings or valuables.  Contacts, dentures or bridgework may not be worn into surgery.  Leave suitcase in the car. After surgery it may be brought to your room.  For patients admitted to the hospital, checkout time is 11:00 AM the day of  discharge.   Patients discharged the day of surgery will not be allowed to drive  home.    Special Instructions:    Please read over the following fact sheets that you were given: Pain Booklet, Coughing and Deep Breathing and Surgical Site Infection Prevention

## 2013-04-24 ENCOUNTER — Encounter (HOSPITAL_COMMUNITY): Payer: Self-pay

## 2013-04-24 ENCOUNTER — Encounter (HOSPITAL_COMMUNITY)
Admission: RE | Admit: 2013-04-24 | Discharge: 2013-04-24 | Disposition: A | Discharge status: Home / Self Care | Payer: PRIVATE HEALTH INSURANCE | Source: Ambulatory Visit | Attending: Surgery | Admitting: Surgery

## 2013-04-24 DIAGNOSIS — Z01810 Encounter for preprocedural cardiovascular examination: Secondary | ICD-10-CM | POA: Insufficient documentation

## 2013-04-24 DIAGNOSIS — Z01818 Encounter for other preprocedural examination: Secondary | ICD-10-CM | POA: Insufficient documentation

## 2013-04-24 DIAGNOSIS — Z01812 Encounter for preprocedural laboratory examination: Secondary | ICD-10-CM | POA: Insufficient documentation

## 2013-04-24 HISTORY — DX: Hypercalcemia: E83.52

## 2013-04-24 HISTORY — DX: Benign neoplasm of parathyroid gland: D35.1

## 2013-04-24 LAB — BASIC METABOLIC PANEL
BUN: 21 mg/dL (ref 6–23)
CO2: 26 mEq/L (ref 19–32)
Chloride: 107 mEq/L (ref 96–112)
GFR calc non Af Amer: 48 mL/min — ABNORMAL LOW (ref >90)
Glucose, Bld: 90 mg/dL (ref 70–99)
Potassium: 4.1 mEq/L (ref 3.5–5.1)
Sodium: 140 mEq/L (ref 135–145)

## 2013-04-24 LAB — CBC
HCT: 38.8 % — ABNORMAL LOW (ref 39.0–52.0)
Hemoglobin: 13.4 g/dL (ref 13.0–17.0)
MCH: 30.2 pg (ref 26.0–34.0)
MCHC: 34.5 g/dL (ref 30.0–36.0)
MCV: 87.4 fL (ref 78.0–100.0)
RBC: 4.44 MIL/uL (ref 4.22–5.81)

## 2013-04-28 ENCOUNTER — Encounter: Payer: Self-pay | Admitting: Family Medicine

## 2013-04-28 ENCOUNTER — Ambulatory Visit (INDEPENDENT_AMBULATORY_CARE_PROVIDER_SITE_OTHER): Payer: PRIVATE HEALTH INSURANCE | Admitting: Family Medicine

## 2013-04-28 VITALS — BP 124/75 | HR 44 | Temp 97.1°F | Ht 71.0 in | Wt 188.8 lb

## 2013-04-28 DIAGNOSIS — E785 Hyperlipidemia, unspecified: Secondary | ICD-10-CM

## 2013-04-28 DIAGNOSIS — N189 Chronic kidney disease, unspecified: Secondary | ICD-10-CM

## 2013-04-28 DIAGNOSIS — R7309 Other abnormal glucose: Secondary | ICD-10-CM

## 2013-04-28 DIAGNOSIS — R739 Hyperglycemia, unspecified: Secondary | ICD-10-CM

## 2013-04-28 DIAGNOSIS — M109 Gout, unspecified: Secondary | ICD-10-CM

## 2013-04-28 DIAGNOSIS — I10 Essential (primary) hypertension: Secondary | ICD-10-CM

## 2013-04-28 DIAGNOSIS — D351 Benign neoplasm of parathyroid gland: Secondary | ICD-10-CM

## 2013-04-28 LAB — POCT GLYCOSYLATED HEMOGLOBIN (HGB A1C): Hemoglobin A1C: 5.3

## 2013-04-28 NOTE — Progress Notes (Signed)
Patient ID: Phillip Blair, male   DOB: March 22, 1956, 57 y.o.   MRN: [id number] SUBJECTIVE: CC: Chief Complaint  Patient presents with  . Follow-up    2 month follow up chronic problelms  . Medication Refill    needs refills     HPI: Doing well. Patient is here for follow up of hyperlipidemia:/hyperglycemia/hypertension. denies Headache;denies Chest Pain;denies weakness;denies Shortness of Breath and orthopnea;denies Visual changes;denies palpitations;denies cough;denies pedal edema;denies symptoms of TIA or stroke;deniesClaudication symptoms. admits to Compliance with medications; denies Problems with medications.  Past Medical History  Diagnosis Date  . Allergy   . Hyperlipidemia   . Chronic kidney disease   . GERD (gastroesophageal reflux disease)   . Gout   . Sleep apnea     uses CPAP  . Hypertension     does not see a cardiologist  . Parathyroid adenoma   . Hypercalcemia    Past Surgical History  Procedure Laterality Date  . Vasectomy    . Nasal septoplasty w/ turbinoplasty     History   Social History  . Marital Status: Married    Spouse Name: N/A    Number of Children: N/A  . Years of Education: N/A   Occupational History  . Not on file.   Social History Main Topics  . Smoking status: Never Smoker   . Smokeless tobacco: Never Used  . Alcohol Use: No  . Drug Use: No  . Sexually Active: Not on file   Other Topics Concern  . Not on file   Social History Narrative  . No narrative on file   No family history on file. Current Outpatient Prescriptions on File Prior to Visit  Medication Sig Dispense Refill  . allopurinol (ZYLOPRIM) 300 MG tablet Take 1 tablet (300 mg total) by mouth daily.  90 tablet  6  . amLODipine (NORVASC) 10 MG tablet Take 1 tablet (10 mg total) by mouth daily.  90 tablet  6  . fish oil-omega-3 fatty acids 1000 MG capsule Take 1 capsule by mouth daily.      . Flaxseed, Linseed, (COLD MILLED GOLDEN FLAX SEED) POWD Take 15 mLs by  mouth. Note 1 tablespoon/ day      . losartan (COZAAR) 100 MG tablet Take 1 tablet (100 mg total) by mouth daily.  90 tablet  2   No current facility-administered medications on file prior to visit.   No Known Allergies Immunization History  Administered Date(s) Administered  . Influenza Split 07/09/2011, 08/11/2012  . Influenza Whole 07/26/2010  . Tdap 08/10/2011   Prior to Admission medications   Medication Sig Start Date End Date Taking? Authorizing Provider  allopurinol (ZYLOPRIM) 300 MG tablet Take 1 tablet (300 mg total) by mouth daily. 08/11/12  Yes Marletta Lor, MD  amLODipine (NORVASC) 10 MG tablet Take 1 tablet (10 mg total) by mouth daily. 08/11/12  Yes Marletta Lor, MD  fish oil-omega-3 fatty acids 1000 MG capsule Take 1 capsule by mouth daily.   Yes Historical Provider, MD  Flaxseed, Linseed, (COLD MILLED GOLDEN FLAX SEED) POWD Take 15 mLs by mouth. Note 1 tablespoon/ day   Yes Historical Provider, MD  losartan (COZAAR) 100 MG tablet Take 1 tablet (100 mg total) by mouth daily. 01/14/13  Yes [name], MD     ROS: As above in the HPI. All other systems are stable or negative.  OBJECTIVE: APPEARANCE:  Patient in no acute distress.The patient appeared well nourished and normally developed. Acyanotic. Waist: 38 1/4  inches VITAL SIGNS:BP 124/75  Pulse 44  Temp(Src) 97.1 F (36.2 C) (Oral)  Ht 5\' 11"  (1.803 m)  Wt 188 lb 12.8 oz (85.639 kg)  BMI 26.34 kg/m2 [name]  SKIN: warm and  Dry without overt rashes, tattoos and scars  HEAD and Neck: without JVD, Head and scalp: normal Eyes:No scleral icterus. Fundi normal, eye movements normal. Ears: Auricle normal, canal normal, Tympanic membranes normal, insufflation normal. Nose: normal Throat: normal Neck & thyroid: normal  CHEST & LUNGS: Chest wall: normal Lungs: Clear  CVS: Reveals the PMI to be normally located. Regular rhythm, First and Second Heart sounds are normal,  absence of  murmurs, rubs or gallops. Peripheral vasculature: Radial pulses: normal Dorsal pedis pulses: normal Posterior pulses: normal  ABDOMEN:  Appearance: normal Benign, no organomegaly, no masses, no Abdominal Aortic enlargement. No Guarding , no rebound. No Bruits. Bowel sounds: normal  RECTAL: N/A GU: N/A  EXTREMETIES: nonedematous. Both Femoral and Pedal pulses are normal.  MUSCULOSKELETAL:  Spine: normal Joints: intact  NEUROLOGIC: oriented to time,place and person; nonfocal. Strength is normal Sensory is normal Reflexes are normal Cranial Nerves are normal.  ASSESSMENT: Chronic kidney disease, unspecified stage  HLD (hyperlipidemia) - Plan: Hepatic function panel, NMR, lipoprofile  Hyperglycemia - Plan: POCT glycosylated hemoglobin (Hb A1C)  HYPERTENSION  Parathyroid adenoma  Gout - Plan: Uric acid  Doing much better. Even his renal function is better. Scheduled for excision of the parathyroid adenoma.  PLAN: Orders Placed This Encounter  Procedures  . Hepatic function panel  . NMR, lipoprofile  . Uric acid  . POCT glycosylated hemoglobin (Hb A1C)    Results for orders placed in visit on 04/28/13  POCT GLYCOSYLATED HEMOGLOBIN (HGB A1C)      Result Value Range   Hemoglobin A1C 5.3    continue plant based dietary changes and exercise.    Return in about 4 months (around 08/28/2013) for Recheck medical problems.  Mandeep Kiser P. Jacelyn Grip, M.D.

## 2013-04-29 LAB — HEPATIC FUNCTION PANEL
ALT: 21 IU/L (ref 0–44)
AST: 17 IU/L (ref 0–40)
Albumin: 4.4 g/dL (ref 3.5–5.5)
Alkaline Phosphatase: 101 IU/L (ref 39–117)
Bilirubin, Direct: 0.2 mg/dL (ref 0.00–0.40)
Total Bilirubin: 0.8 mg/dL (ref 0.0–1.2)
Total Protein: 6.5 g/dL (ref 6.0–8.5)

## 2013-04-29 LAB — NMR, LIPOPROFILE
Cholesterol: 168 mg/dL (ref <200)
HDL Cholesterol by NMR: 44 mg/dL (ref >=40)
HDL Particle Number: 26.4 umol/L — ABNORMAL LOW (ref >=30.5)
LDL Particle Number: 1524 nmol/L — ABNORMAL HIGH (ref <1000)
LDL Size: 20.9 nm (ref >20.5)
LDLC SERPL CALC-MCNC: 104 mg/dL — ABNORMAL HIGH (ref <100)
LP-IR Score: 43 (ref <=45)
Small LDL Particle Number: 822 nmol/L — ABNORMAL HIGH (ref <=527)
Triglycerides by NMR: 99 mg/dL (ref <150)

## 2013-04-29 LAB — URIC ACID: Uric Acid: 4.7 mg/dL (ref 3.7–8.6)

## 2013-04-30 NOTE — Progress Notes (Signed)
Quick Note:  Lab result at or close to goal. The sugar is normal No change in Medications for now. No Change in plans and follow up. The diet is working!!! ______

## 2013-05-03 MED ORDER — CEFAZOLIN SODIUM-DEXTROSE 2-3 GM-% IV SOLR
2.0000 g | INTRAVENOUS | Status: AC
  Administered 2013-05-04: 2 g via INTRAVENOUS
  Filled 2013-05-03: qty 50

## 2013-05-03 NOTE — H&P (Signed)
Patient ID: Phillip Blair, male DOB: [birth year], 57 y.o. MRN: [id number]  Chief Complaint   Patient presents with   .  Thyroid Problem   HPI  Phillip Blair is a 57 y.o. male.  HPI  This is a very pleasant gentleman referred by Dr. Jacelyn Grip for evaluation of a potential parathyroid adenoma. He was found recently a laboratory data to have an elevated calcium to as high as 11.5. His followup parathyroid hormone level was 173. He went on to have a sestamibi scan which was suspicious for an inferior right parathyroid adenoma. He has had kidney stones in the past but is otherwise asymptomatic  Past Medical History   Diagnosis  Date   .  Allergy    .  Hypertension    .  Hyperlipidemia    .  Chronic kidney disease    .  GERD (gastroesophageal reflux disease)    .  Gout    .  Sleep apnea     Past Surgical History   Procedure  Laterality  Date   .  Vasectomy     .  Nasal septoplasty w/ turbinoplasty     History reviewed. No pertinent family history.  Social History  History   Substance Use Topics   .  Smoking status:  Never Smoker   .  Smokeless tobacco:  Never Used   .  Alcohol Use:  No   No Known Allergies  Current Outpatient Prescriptions   Medication  Sig  Dispense  Refill   .  allopurinol (ZYLOPRIM) 300 MG tablet  Take 1 tablet (300 mg total) by mouth daily.  90 tablet  6   .  amLODipine (NORVASC) 10 MG tablet  Take 1 tablet (10 mg total) by mouth daily.  90 tablet  6   .  fluticasone (FLONASE) 50 MCG/ACT nasal spray  Place 2 sprays into the nose daily.  48 g  1   .  hydrochlorothiazide (HYDRODIURIL) 25 MG tablet  Take 1 tablet (25 mg total) by mouth daily.  90 tablet  3   .  losartan (COZAAR) 100 MG tablet  Take 1 tablet (100 mg total) by mouth daily.  90 tablet  2   .  traMADol (ULTRAM) 50 MG tablet  Take 1 tablet (50 mg total) by mouth every 8 (eight) hours as needed for pain.  30 tablet  0    No current facility-administered medications for this visit.   Review of  Systems  Review of Systems  Constitutional: Negative for fever, chills and unexpected weight change.  HENT: Negative for hearing loss, congestion, sore throat, trouble swallowing and voice change.  Eyes: Negative for visual disturbance.  Respiratory: Negative for cough and wheezing.  Cardiovascular: Negative for chest pain, palpitations and leg swelling.  Gastrointestinal: Negative for nausea, vomiting, abdominal pain, diarrhea, constipation, blood in stool, abdominal distention, anal bleeding and rectal pain.  Genitourinary: Negative for hematuria and difficulty urinating.  Musculoskeletal: Negative for arthralgias.  Skin: Negative for rash and wound.  Neurological: Negative for seizures, syncope, weakness and headaches.  Hematological: Negative for adenopathy. Does not bruise/bleed easily.  Psychiatric/Behavioral: Negative for confusion.  Blood pressure 122/72, pulse 77, temperature 97.7 F (36.5 C), temperature source Temporal, resp. rate 12, height 6' (1.829 m), weight 194 lb 9.6 oz (88.27 kg).  Physical Exam  Physical Exam  Constitutional: He is oriented to person, place, and time. He appears well-developed and well-nourished. No distress.  HENT:  Head: Normocephalic.  Right Ear: External  ear normal.  Left Ear: External ear normal.  Nose: Nose normal.  Mouth/Throat: Oropharynx is clear and moist. No oropharyngeal exudate.  Eyes: Conjunctivae and EOM are normal. Pupils are equal, round, and reactive to light. Right eye exhibits no discharge. Left eye exhibits no discharge. No scleral icterus.  Neck: Normal range of motion. Neck supple. No tracheal deviation present. No thyromegaly present.  Cardiovascular: Normal rate, regular rhythm, normal heart sounds and intact distal pulses.  No murmur heard.  Pulmonary/Chest: Effort normal and breath sounds normal. No respiratory distress. He has no wheezes. He has no rales.  Musculoskeletal: Normal range of motion. He exhibits no edema and no  tenderness.  Lymphadenopathy:  He has no cervical adenopathy.  Neurological: He is alert and oriented to person, place, and time.  Skin: Skin is warm and dry. No rash noted. He is not diaphoretic. No erythema.  Psychiatric: His behavior is normal. Judgment normal.  Data Reviewed  I have reviewed his notes, laboratory data, and sestamibi scan  Assessment  Parathyroid adenoma  Plan  Minimally invasive parathyroidectomy was recommended. I have discussed this with the patient and his wife in detail. I discussed the risks of surgery which includes but is not limited to bleeding, infection, recurrent laryngeal nerve injury, need for a complete neck exploration showed a gland be difficult to find, postop hypocalcemia, et Ronney Asters. They understand and he wishes to proceed. Surgery will be scheduled

## 2013-05-04 ENCOUNTER — Encounter (HOSPITAL_COMMUNITY): Payer: Self-pay | Admitting: *Deleted

## 2013-05-04 ENCOUNTER — Encounter (HOSPITAL_COMMUNITY): Payer: Self-pay | Admitting: Critical Care Medicine

## 2013-05-04 ENCOUNTER — Encounter (HOSPITAL_COMMUNITY)
Admission: RE | Disposition: A | Discharge status: Home / Self Care | Payer: Self-pay | Source: Ambulatory Visit | Attending: Surgery

## 2013-05-04 ENCOUNTER — Observation Stay (HOSPITAL_COMMUNITY)
Admission: RE | Admit: 2013-05-04 | Discharge: 2013-05-05 | Disposition: A | Discharge status: Home / Self Care | Payer: PRIVATE HEALTH INSURANCE | Source: Ambulatory Visit | Attending: Surgery | Admitting: Surgery

## 2013-05-04 ENCOUNTER — Ambulatory Visit (HOSPITAL_COMMUNITY): Payer: PRIVATE HEALTH INSURANCE | Admitting: Critical Care Medicine

## 2013-05-04 DIAGNOSIS — N189 Chronic kidney disease, unspecified: Secondary | ICD-10-CM | POA: Insufficient documentation

## 2013-05-04 DIAGNOSIS — D351 Benign neoplasm of parathyroid gland: Principal | ICD-10-CM | POA: Insufficient documentation

## 2013-05-04 DIAGNOSIS — I129 Hypertensive chronic kidney disease with stage 1 through stage 4 chronic kidney disease, or unspecified chronic kidney disease: Secondary | ICD-10-CM | POA: Insufficient documentation

## 2013-05-04 DIAGNOSIS — E210 Primary hyperparathyroidism: Secondary | ICD-10-CM | POA: Insufficient documentation

## 2013-05-04 HISTORY — PX: MINIMALLY INVASIVE RADIOACTIVE PARATHYROIDECTOMY: SHX5272

## 2013-05-04 LAB — BASIC METABOLIC PANEL
BUN: 21 mg/dL (ref 6–23)
Chloride: 103 mEq/L (ref 96–112)
Creatinine, Ser: 1.54 mg/dL — ABNORMAL HIGH (ref 0.50–1.35)
GFR calc Af Amer: 56 mL/min — ABNORMAL LOW (ref >90)

## 2013-05-04 SURGERY — PARATHYROIDECTOMY, MINIMALLY INVASIVE, WITH INTRAOPERATIVE RADIONUCLIDE GUIDANCE
Anesthesia: General | Site: Neck | Wound class: Clean

## 2013-05-04 MED ORDER — POTASSIUM CHLORIDE IN NACL 20-0.9 MEQ/L-% IV SOLN
INTRAVENOUS | Status: DC
  Administered 2013-05-04: 12:00:00 via INTRAVENOUS
  Filled 2013-05-04 (×2): qty 1000

## 2013-05-04 MED ORDER — OXYCODONE HCL 5 MG PO TABS: 5.0000 mg | ORAL_TABLET | Freq: Once | ORAL | Status: DC | PRN

## 2013-05-04 MED ORDER — PROPOFOL 10 MG/ML IV BOLUS
INTRAVENOUS | Status: DC | PRN
  Administered 2013-05-04: 200 mg via INTRAVENOUS

## 2013-05-04 MED ORDER — ENOXAPARIN SODIUM 40 MG/0.4ML ~~LOC~~ SOLN
40.0000 mg | SUBCUTANEOUS | Status: DC
  Administered 2013-05-05: 40 mg via SUBCUTANEOUS
  Filled 2013-05-04 (×2): qty 0.4

## 2013-05-04 MED ORDER — ONDANSETRON HCL 4 MG PO TABS: 4.0000 mg | ORAL_TABLET | Freq: Four times a day (QID) | ORAL | Status: DC | PRN

## 2013-05-04 MED ORDER — ROCURONIUM BROMIDE 100 MG/10ML IV SOLN
INTRAVENOUS | Status: DC | PRN
  Administered 2013-05-04: 30 mg via INTRAVENOUS

## 2013-05-04 MED ORDER — GLYCOPYRROLATE 0.2 MG/ML IJ SOLN
INTRAMUSCULAR | Status: DC | PRN
  Administered 2013-05-04: 0.1 mg via INTRAVENOUS
  Administered 2013-05-04: 0.4 mg via INTRAVENOUS
  Administered 2013-05-04: 0.1 mg via INTRAVENOUS

## 2013-05-04 MED ORDER — LACTATED RINGERS IV SOLN
INTRAVENOUS | Status: DC | PRN
  Administered 2013-05-04 (×2): via INTRAVENOUS

## 2013-05-04 MED ORDER — 0.9 % SODIUM CHLORIDE (POUR BTL) OPTIME
TOPICAL | Status: DC | PRN
  Administered 2013-05-04: 1000 mL

## 2013-05-04 MED ORDER — MORPHINE SULFATE 4 MG/ML IJ SOLN: 4.0000 mg | INTRAMUSCULAR | Status: DC | PRN

## 2013-05-04 MED ORDER — MIDAZOLAM HCL 5 MG/5ML IJ SOLN
INTRAMUSCULAR | Status: DC | PRN
  Administered 2013-05-04: 2 mg via INTRAVENOUS

## 2013-05-04 MED ORDER — EPHEDRINE SULFATE 50 MG/ML IJ SOLN
INTRAMUSCULAR | Status: DC | PRN
  Administered 2013-05-04 (×3): 5 mg via INTRAVENOUS

## 2013-05-04 MED ORDER — ONDANSETRON HCL 4 MG/2ML IJ SOLN: 4.0000 mg | Freq: Once | INTRAMUSCULAR | Status: DC | PRN

## 2013-05-04 MED ORDER — ALLOPURINOL 300 MG PO TABS
300.0000 mg | ORAL_TABLET | Freq: Every day | ORAL | Status: DC
  Filled 2013-05-04 (×2): qty 1

## 2013-05-04 MED ORDER — MICROFIBRILLAR COLL HEMOSTAT EX PADS
MEDICATED_PAD | CUTANEOUS | Status: DC | PRN
  Administered 2013-05-04: 1 via TOPICAL

## 2013-05-04 MED ORDER — LOSARTAN POTASSIUM 50 MG PO TABS
100.0000 mg | ORAL_TABLET | Freq: Every day | ORAL | Status: DC
  Filled 2013-05-04 (×2): qty 2

## 2013-05-04 MED ORDER — FENTANYL CITRATE 0.05 MG/ML IJ SOLN
INTRAMUSCULAR | Status: DC | PRN
  Administered 2013-05-04: 100 ug via INTRAVENOUS
  Administered 2013-05-04: 50 ug via INTRAVENOUS
  Administered 2013-05-04: 100 ug via INTRAVENOUS

## 2013-05-04 MED ORDER — DEXAMETHASONE SODIUM PHOSPHATE 4 MG/ML IJ SOLN
INTRAMUSCULAR | Status: DC | PRN
  Administered 2013-05-04: 4 mg via INTRAVENOUS

## 2013-05-04 MED ORDER — ONDANSETRON HCL 4 MG/2ML IJ SOLN
INTRAMUSCULAR | Status: DC | PRN
  Administered 2013-05-04: 4 mg via INTRAVENOUS

## 2013-05-04 MED ORDER — HYDROCODONE-ACETAMINOPHEN 5-325 MG PO TABS
1.0000 | ORAL_TABLET | ORAL | Status: DC | PRN
  Administered 2013-05-04 – 2013-05-05 (×5): 2 via ORAL
  Filled 2013-05-04 (×5): qty 2

## 2013-05-04 MED ORDER — HYDROMORPHONE HCL PF 1 MG/ML IJ SOLN
0.2500 mg | INTRAMUSCULAR | Status: DC | PRN
  Administered 2013-05-04 (×2): 0.5 mg via INTRAVENOUS

## 2013-05-04 MED ORDER — BUPIVACAINE-EPINEPHRINE PF 0.5-1:200000 % IJ SOLN
INTRAMUSCULAR | Status: AC
  Filled 2013-05-04: qty 30

## 2013-05-04 MED ORDER — AMLODIPINE BESYLATE 10 MG PO TABS
10.0000 mg | ORAL_TABLET | Freq: Every day | ORAL | Status: DC
  Filled 2013-05-04 (×2): qty 1

## 2013-05-04 MED ORDER — NEOSTIGMINE METHYLSULFATE 1 MG/ML IJ SOLN
INTRAMUSCULAR | Status: DC | PRN
  Administered 2013-05-04: 3 mg via INTRAVENOUS

## 2013-05-04 MED ORDER — HYDROMORPHONE HCL PF 1 MG/ML IJ SOLN
INTRAMUSCULAR | Status: AC
  Filled 2013-05-04: qty 1

## 2013-05-04 MED ORDER — ONDANSETRON HCL 4 MG/2ML IJ SOLN: 4.0000 mg | Freq: Four times a day (QID) | INTRAMUSCULAR | Status: DC | PRN

## 2013-05-04 MED ORDER — LIDOCAINE HCL (CARDIAC) 20 MG/ML IV SOLN
INTRAVENOUS | Status: DC | PRN
  Administered 2013-05-04: 80 mg via INTRAVENOUS

## 2013-05-04 MED ORDER — BUPIVACAINE-EPINEPHRINE 0.5% -1:200000 IJ SOLN
INTRAMUSCULAR | Status: DC | PRN
  Administered 2013-05-04: 10 mL

## 2013-05-04 MED ORDER — OXYCODONE HCL 5 MG/5ML PO SOLN: 5.0000 mg | Freq: Once | ORAL | Status: DC | PRN

## 2013-05-04 SURGICAL SUPPLY — 51 items
APL SKNCLS STERI-STRIP NONHPOA (GAUZE/BANDAGES/DRESSINGS) ×1
BENZOIN TINCTURE PRP APPL 2/3 (GAUZE/BANDAGES/DRESSINGS) ×2 IMPLANT
BLADE SURG 15 STRL LF DISP TIS (BLADE) ×1 IMPLANT
BLADE SURG 15 STRL SS (BLADE) ×2
BLADE SURG ROTATE 9660 (MISCELLANEOUS) IMPLANT
CANISTER SUCTION 2500CC (MISCELLANEOUS) ×2 IMPLANT
CHLORAPREP W/TINT 10.5 ML (MISCELLANEOUS) ×2 IMPLANT
CLIP TI MEDIUM 6 (CLIP) ×2 IMPLANT
CLIP TI WIDE RED SMALL 6 (CLIP) ×2 IMPLANT
CLOTH BEACON ORANGE TIMEOUT ST (SAFETY) ×2 IMPLANT
CONT SPEC 4OZ CLIKSEAL STRL BL (MISCELLANEOUS) ×2 IMPLANT
COVER SURGICAL LIGHT HANDLE (MISCELLANEOUS) ×2 IMPLANT
COVER TRANSDUCER ULTRASND GEL (DRAPE) ×2 IMPLANT
CRADLE DONUT ADULT HEAD (MISCELLANEOUS) ×2 IMPLANT
DECANTER SPIKE VIAL GLASS SM (MISCELLANEOUS) ×1 IMPLANT
DRAPE PED LAPAROTOMY (DRAPES) ×2 IMPLANT
DRSG TEGADERM 4X4.75 (GAUZE/BANDAGES/DRESSINGS) ×2 IMPLANT
ELECT CAUTERY BLADE 6.4 (BLADE) ×2 IMPLANT
ELECT REM PT RETURN 9FT ADLT (ELECTROSURGICAL) ×2
ELECTRODE REM PT RTRN 9FT ADLT (ELECTROSURGICAL) ×1 IMPLANT
GAUZE SPONGE 2X2 8PLY STRL LF (GAUZE/BANDAGES/DRESSINGS) ×1 IMPLANT
GAUZE SPONGE 4X4 16PLY XRAY LF (GAUZE/BANDAGES/DRESSINGS) ×2 IMPLANT
GLOVE BIOGEL PI IND STRL 7.0 (GLOVE) IMPLANT
GLOVE BIOGEL PI INDICATOR 7.0 (GLOVE) ×1
GLOVE SURG SIGNA 7.5 PF LTX (GLOVE) ×2 IMPLANT
GLOVE SURG SS PI 7.0 STRL IVOR (GLOVE) ×1 IMPLANT
GOWN STRL NON-REIN LRG LVL3 (GOWN DISPOSABLE) ×3 IMPLANT
GOWN STRL REIN XL XLG (GOWN DISPOSABLE) ×2 IMPLANT
HEMOSTAT SNOW SURGICEL 2X4 (HEMOSTASIS) IMPLANT
HEMOSTAT SURGICEL 2X4 FIBR (HEMOSTASIS) ×1 IMPLANT
KIT BASIN OR (CUSTOM PROCEDURE TRAY) ×2 IMPLANT
KIT ROOM TURNOVER OR (KITS) ×2 IMPLANT
NDL HYPO 25GX1X1/2 BEV (NEEDLE) ×1 IMPLANT
NEEDLE HYPO 25GX1X1/2 BEV (NEEDLE) ×2 IMPLANT
NS IRRIG 1000ML POUR BTL (IV SOLUTION) ×2 IMPLANT
PACK SURGICAL SETUP 50X90 (CUSTOM PROCEDURE TRAY) ×2 IMPLANT
PAD ARMBOARD 7.5X6 YLW CONV (MISCELLANEOUS) ×2 IMPLANT
PENCIL BUTTON HOLSTER BLD 10FT (ELECTRODE) ×2 IMPLANT
SPONGE GAUZE 2X2 STER 10/PKG (GAUZE/BANDAGES/DRESSINGS) ×1
SPONGE INTESTINAL PEANUT (DISPOSABLE) ×1 IMPLANT
STRIP CLOSURE SKIN 1/2X4 (GAUZE/BANDAGES/DRESSINGS) ×2 IMPLANT
SUT MNCRL AB 4-0 PS2 18 (SUTURE) ×2 IMPLANT
SUT SILK 3 0 (SUTURE) ×2
SUT SILK 3-0 18XBRD TIE 12 (SUTURE) IMPLANT
SUT VIC AB 3-0 SH 27 (SUTURE) ×2
SUT VIC AB 3-0 SH 27XBRD (SUTURE) ×1 IMPLANT
SYR BULB 3OZ (MISCELLANEOUS) ×2 IMPLANT
SYR CONTROL 10ML LL (SYRINGE) ×2 IMPLANT
TOWEL OR 17X24 6PK STRL BLUE (TOWEL DISPOSABLE) ×2 IMPLANT
TOWEL OR 17X26 10 PK STRL BLUE (TOWEL DISPOSABLE) ×2 IMPLANT
TUBE CONNECTING 12X1/4 (SUCTIONS) ×2 IMPLANT

## 2013-05-04 NOTE — Anesthesia Postprocedure Evaluation (Signed)
  Anesthesia Post-op Note  Patient: Phillip Blair  Procedure(s) Performed: Procedure(s): PARATHYROIDECTOMY MINIMALLY INVASIVE (N/A)  Patient Location: PACU  Anesthesia Type:General  Level of Consciousness: awake, alert  and oriented  Airway and Oxygen Therapy: Patient Spontanous Breathing and Patient connected to nasal cannula oxygen  Post-op Pain: mild  Post-op Assessment: Post-op Vital signs reviewed  Post-op Vital Signs: Reviewed  Complications: No apparent anesthesia complications

## 2013-05-04 NOTE — Anesthesia Procedure Notes (Signed)
Procedure Name: Intubation Date/Time: 05/04/2013 7:35 AM Performed by: Carola Frost Pre-anesthesia Checklist: Patient identified, Timeout performed, Emergency Drugs available, Suction available and Patient being monitored Patient Re-evaluated:Patient Re-evaluated prior to inductionOxygen Delivery Method: Circle system utilized Preoxygenation: Pre-oxygenation with 100% oxygen Intubation Type: IV induction Ventilation: Mask ventilation without difficulty Laryngoscope Size: Mac and 4 Grade View: Grade I Tube type: Oral Tube size: 7.5 mm Number of attempts: 1 Airway Equipment and Method: Stylet Placement Confirmation: positive ETCO2,  ETT inserted through vocal cords under direct vision and breath sounds checked- equal and bilateral Secured at: 23 cm Tube secured with: Tape Dental Injury: Teeth and Oropharynx as per pre-operative assessment

## 2013-05-04 NOTE — Op Note (Signed)
PARATHYROIDECTOMY MINIMALLY INVASIVE  Procedure Note  ISSAK BRANNON 05/04/2013   Pre-op Diagnosis: parathyroid adenoma, hypercalcemia     Post-op Diagnosis: same  Procedure(s): PARATHYROIDECTOMY MINIMALLY INVASIVE  Surgeon(s): Harl Bowie, MD  Anesthesia: General  Staff:  Circulator: Eudelia Bunch Scrub Person: Abe People, RN  Estimated Blood Loss: Minimal               Specimens: sent to path          [facility]   Date: 05/04/2013  Time: 8:35 AM

## 2013-05-04 NOTE — Anesthesia Preprocedure Evaluation (Addendum)
Anesthesia Evaluation  Patient identified by MRN, date of birth, ID band Patient awake    Reviewed: Allergy & Precautions, H&P , NPO status , Patient's Chart, lab work & pertinent test results  Airway Mallampati: II TM Distance: >3 FB     Dental  (+) Teeth Intact and Dental Advisory Given   Pulmonary sleep apnea and Continuous Positive Airway Pressure Ventilation ,  breath sounds clear to auscultation        Cardiovascular hypertension, Pt. on medications Rhythm:Regular Rate:Normal     Neuro/Psych    GI/Hepatic GERD-  ,  Endo/Other    Renal/GU      Musculoskeletal   Abdominal   Peds  Hematology   Anesthesia Other Findings   Reproductive/Obstetrics                         Anesthesia Physical Anesthesia Plan  ASA: II  Anesthesia Plan: General   Post-op Pain Management:    Induction: Intravenous  Airway Management Planned: Oral ETT  Additional Equipment:   Intra-op Plan:   Post-operative Plan: Extubation in OR  Informed Consent: I have reviewed the patients History and Physical, chart, labs and discussed the procedure including the risks, benefits and alternatives for the proposed anesthesia with the patient or authorized representative who has indicated his/her understanding and acceptance.   Dental advisory given  Plan Discussed with: Anesthesiologist, Surgeon and CRNA  Anesthesia Plan Comments:        Anesthesia Quick Evaluation

## 2013-05-04 NOTE — Interval H&P Note (Signed)
History and Physical Interval Note: no change in H and P  05/04/2013 6:59 AM  Phillip Blair  has presented today for surgery, with the diagnosis of parathyroid  The various methods of treatment have been discussed with the patient and family. After consideration of risks, benefits and other options for treatment, the patient has consented to  Procedure(s): PARATHYROIDECTOMY MINIMALLY INVASIVE (N/A) as a surgical intervention .  The patient's history has been reviewed, patient examined, no change in status, stable for surgery.  I have reviewed the patient's chart and labs.  Questions were answered to the patient's satisfaction.     [name]

## 2013-05-04 NOTE — Progress Notes (Signed)
Placed patient on CPAP via auto mode with minimum pressure set at 4cm and maximum pressure set at 20cm. Sp02=97% at this time. Patient used his home mask and tubing.

## 2013-05-04 NOTE — Preoperative (Signed)
Beta Blockers   Reason not to administer Beta Blockers:Not Applicable 

## 2013-05-04 NOTE — Transfer of Care (Signed)
Immediate Anesthesia Transfer of Care Note  Patient: Phillip Blair  Procedure(s) Performed: Procedure(s): PARATHYROIDECTOMY MINIMALLY INVASIVE (N/A)  Patient Location: PACU  Anesthesia Type:General  Level of Consciousness: awake, alert  and oriented  Airway & Oxygen Therapy: Patient Spontanous Breathing and Patient connected to nasal cannula oxygen  Post-op Assessment: Report given to PACU RN, Post -op Vital signs reviewed and stable and Patient moving all extremities X 4  Post vital signs: Reviewed and stable  Complications: No apparent anesthesia complications

## 2013-05-05 ENCOUNTER — Other Ambulatory Visit (INDEPENDENT_AMBULATORY_CARE_PROVIDER_SITE_OTHER): Payer: Self-pay | Admitting: Surgery

## 2013-05-05 ENCOUNTER — Other Ambulatory Visit (INDEPENDENT_AMBULATORY_CARE_PROVIDER_SITE_OTHER): Payer: Self-pay | Admitting: General Surgery

## 2013-05-05 ENCOUNTER — Encounter (HOSPITAL_COMMUNITY): Payer: Self-pay | Admitting: Surgery

## 2013-05-05 DIAGNOSIS — Z9009 Acquired absence of other part of head and neck: Secondary | ICD-10-CM

## 2013-05-05 DIAGNOSIS — E892 Postprocedural hypoparathyroidism: Secondary | ICD-10-CM

## 2013-05-05 LAB — BASIC METABOLIC PANEL
BUN: 21 mg/dL (ref 6–23)
GFR calc non Af Amer: 46 mL/min — ABNORMAL LOW (ref >90)
Glucose, Bld: 117 mg/dL — ABNORMAL HIGH (ref 70–99)
Potassium: 4.3 mEq/L (ref 3.5–5.1)

## 2013-05-05 MED ORDER — HYDROCODONE-ACETAMINOPHEN 5-325 MG PO TABS: 1.0000 | ORAL_TABLET | ORAL | Status: DC | PRN

## 2013-05-05 NOTE — Progress Notes (Signed)
1 Day Post-Op  Subjective: No complaints  Objective: Vital signs in last 24 hours: Temp:  [97.3 F (36.3 C)-98.5 F (36.9 C)] 98.4 F (36.9 C) (08/12 0647) Pulse Rate:  [43-92] 77 (08/12 0647) Resp:  [9-19] 17 (08/12 0647) BP: (114-134)/(59-86) 134/72 mmHg (08/12 0647) SpO2:  [94 %-100 %] 100 % (08/12 0647) Last BM Date: 05/03/13  Intake/Output from previous day: 08/11 0701 - 08/12 0700 In: 2567.5 [P.O.:600; I.V.:1967.5] Out: 50 [Blood:50] Intake/Output this shift:    Looks good Incision clean Voice normal  Lab Results:  No results found for this basename: WBC, HGB, HCT, PLT,  in the last 72 hours BMET  Recent Labs  05/04/13 1509 05/05/13 0455  NA 138 139  K 3.7 4.3  CL 103 106  CO2 24 26  GLUCOSE 151* 117*  BUN 21 21  CREATININE 1.54* 1.62*  CALCIUM 11.0* 10.8*   PT/INR No results found for this basename: LABPROT, INR,  in the last 72 hours ABG No results found for this basename: PHART, PCO2, PO2, HCO3,  in the last 72 hours  Studies/Results: No results found.  Anti-infectives: Anti-infectives   Start     Dose/Rate Route Frequency Ordered Stop   05/04/13 0600  ceFAZolin (ANCEF) IVPB 2 g/50 mL premix     2 g 100 mL/hr over 30 Minutes Intravenous On call to O.R. 05/03/13 1323 05/04/13 0740      Assessment/Plan: s/p Procedure(s): PARATHYROIDECTOMY MINIMALLY INVASIVE (N/A)  Discharge home Follow calcium levels  LOS: 1 day    Arlynn Stare A 05/05/2013

## 2013-05-05 NOTE — Discharge Summary (Signed)
Physician Discharge Summary  Patient ID: Phillip Blair MRN: [id number] DOB/AGE: 57/04/1956 57 y.o.  Admit date: 05/04/2013 Discharge date: 05/05/2013  Admission Diagnoses:  Discharge Diagnoses:  Active Problems:   * No active hospital problems. * primary hyperparathyroidism  Discharged Condition: good  Hospital Course: uneventful post op recover.  Discharged home POD#1  Consults: None  Significant Diagnostic Studies:   Treatments: surgery: MIRP  Discharge Exam: Blood pressure 134/72, pulse 77, temperature 98.4 F (36.9 C), temperature source Oral, resp. rate 17, SpO2 100.00%. General appearance: alert, cooperative and no distress Neck: incision clean, no hematoma  Disposition: Final discharge disposition not confirmed   Future Appointments Provider Department Dept Phone   05/18/2013 9:00 AM Harl Bowie, MD [facility], [place] [phone]   08/28/2013 8:40 AM [name], MD [facility] [phone]       Medication List         allopurinol 300 MG tablet  Commonly known as:  ZYLOPRIM  Take 1 tablet (300 mg total) by mouth daily.     amLODipine 10 MG tablet  Commonly known as:  NORVASC  Take 1 tablet (10 mg total) by mouth daily.     COLD MILLED GOLDEN FLAX SEED Powd  Take 15 mLs by mouth. Note 1 tablespoon/ day     fish oil-omega-3 fatty acids 1000 MG capsule  Take 1 capsule by mouth daily.     HYDROcodone-acetaminophen 5-325 MG per tablet  Commonly known as:  NORCO/VICODIN  Take 1-2 tablets by mouth every 4 (four) hours as needed.     losartan 100 MG tablet  Commonly known as:  COZAAR  Take 1 tablet (100 mg total) by mouth daily.           Follow-up Information   Follow up with [facility] A, MD. Call on 05/18/2013.   Contact information:   [address] [place] [address] [phone]       Signed: Harl Bowie 05/05/2013, 8:04 AM

## 2013-05-05 NOTE — Progress Notes (Signed)
DC home with wife, verbally understood dc instructions.

## 2013-05-05 NOTE — Op Note (Signed)
NAME:  Phillip Blair, Phillip Blair NO.:  [id number]  MEDICAL RECORD NO.:  [id number]  LOCATION:  [facility]                         FACILITY:  [facility]  PHYSICIAN:  Coralie Keens, M.D. DATE OF BIRTH:  November 30, 1955  DATE OF PROCEDURE:  05/04/2013 DATE OF DISCHARGE:                              OPERATIVE REPORT   PREOPERATIVE DIAGNOSES:  Primary hyperparathyroidism, parathyroid adenoma.  POSTOPERATIVE DIAGNOSES:  Primary hyperparathyroidism, parathyroid adenoma.  PROCEDURES:  Minimally invasive parathyroidectomy.  SURGEON:  Coralie Keens, M.D.  ASSISTANT:  Sammuel Hines. Daiva Nakayama, M.D.  ANESTHESIA:  General endotracheal anesthesia.  ESTIMATED BLOOD LOSS:  Minimal.  INDICATIONS:  This is a 57 year old gentleman who was found to have hypercalcemia.  A sestamibi scan suggested a parathyroid adenoma in the right inferior location.  Decision was made to proceed to the operating room for minimally invasive parathyroidectomy.  FINDINGS:  The patient was indeed found to have an enlarged right inferior parathyroid gland.  Frozen section confirmed parathyroid tissue.  PROCEDURE IN DETAIL:  The patient was brought to the operating room, identified as [name].  He was placed supine on the operating table, and general anesthesia was induced.  His neck was then prepped and draped in the usual sterile fashion.  I made a small incision in a skin fold on the right lower neck with the scalpel.  I took this down through the platysma with electrocautery.  A small bridging jugular vein was identified and tied off with surgical ties.  I then identified the midline and strap muscles and dissected these free and retracted them laterally.  The thyroid gland was identified and after dissection at the inferior pole, an enlarged parathyroid gland was identified.  I grasped this gland and elevated and actually pulled apart.  I sent the section of this to pathology for evaluation and confirmed  parathyroid tissue.  I then removed the rest of the remaining gland using both cautery and a small surgical clips.  The gland appeared to be easily removed.  I examined the right side of the neck extensively and found no other enlarged parathyroid glands.  At this point, I irrigated the wound with saline.  I placed a piece of Fibrillar in the neck to aid with hemostasis.  Hemostasis appeared to be achieved.  I then closed the platysma with interrupted 3-0 Vicryl sutures and closed the skin with a running 4-0 Monocryl.  Steri-Strips, gauze, and Tegaderm were then applied.  The patient tolerated the procedure well.  All the counts were correct at the end of procedure.  The patient was then extubated in the operating room and taken in a stable condition to the recovery room.     Coralie Keens, M.D.     DB/MEDQ  D:  05/04/2013  T:  05/04/2013  Job:  [id number]

## 2013-05-12 ENCOUNTER — Telehealth (INDEPENDENT_AMBULATORY_CARE_PROVIDER_SITE_OTHER): Payer: Self-pay

## 2013-05-12 DIAGNOSIS — IMO0001 Reserved for inherently not codable concepts without codable children: Secondary | ICD-10-CM

## 2013-05-12 MED ORDER — CEPHALEXIN 500 MG PO CAPS: 500.0000 mg | ORAL_CAPSULE | Freq: Three times a day (TID) | ORAL | Status: DC

## 2013-05-12 NOTE — Telephone Encounter (Signed)
Pt called stating neck wd red and swollen. No fever.No drainage. No difficulty breathing or swallowing. Reviewed with Dr Ninfa Linden. Per Dr Ninfa Linden Keflex 500mg  #21 one po tid for 7 days. Pt advised of med being prescribed and to start med today. Pt to call if symptoms do not resolve or become worse. Pt states he understands.

## 2013-05-18 ENCOUNTER — Encounter (INDEPENDENT_AMBULATORY_CARE_PROVIDER_SITE_OTHER): Payer: Self-pay | Admitting: Surgery

## 2013-05-18 ENCOUNTER — Ambulatory Visit (INDEPENDENT_AMBULATORY_CARE_PROVIDER_SITE_OTHER): Payer: PRIVATE HEALTH INSURANCE | Admitting: Surgery

## 2013-05-18 VITALS — BP 130/84 | HR 68 | Temp 98.1°F | Resp 14 | Ht 72.0 in | Wt 190.6 lb

## 2013-05-18 DIAGNOSIS — Z09 Encounter for follow-up examination after completed treatment for conditions other than malignant neoplasm: Secondary | ICD-10-CM

## 2013-05-18 LAB — BASIC METABOLIC PANEL
BUN: 24 mg/dL — ABNORMAL HIGH (ref 6–23)
Chloride: 109 mEq/L (ref 96–112)
Creat: 1.58 mg/dL — ABNORMAL HIGH (ref 0.50–1.35)

## 2013-05-18 NOTE — Progress Notes (Signed)
Subjective:     Patient ID: Phillip Blair, male   DOB: Jun 24, 1956, 57 y.o.   MRN: [id number]  HPI He is here for his first postop visit status post minimally invasive parathyroidectomy. He just had his labs drawn today. He has no complaints and reports his voice is improving  Review of Systems     Objective:   Physical Exam On exam there is a very small seroma Or hematoma at the incision. Laboratory data is pending    Assessment:     Patient stable postop     Plan:     I will see him back in 3 weeks. If his labs elevated, we will repeat him again in several months and repeat the sestamibi scan. Again, his calcium level did not initially decreased postoperatively which is concerning for a missed adenoma

## 2013-05-19 LAB — PTH, INTACT AND CALCIUM: Calcium: 10.4 mg/dL (ref 8.4–10.5)

## 2013-06-08 ENCOUNTER — Encounter (INDEPENDENT_AMBULATORY_CARE_PROVIDER_SITE_OTHER): Payer: Self-pay | Admitting: Surgery

## 2013-06-08 ENCOUNTER — Other Ambulatory Visit (INDEPENDENT_AMBULATORY_CARE_PROVIDER_SITE_OTHER): Payer: Self-pay | Admitting: General Surgery

## 2013-06-08 ENCOUNTER — Ambulatory Visit (INDEPENDENT_AMBULATORY_CARE_PROVIDER_SITE_OTHER): Payer: PRIVATE HEALTH INSURANCE | Admitting: Surgery

## 2013-06-08 VITALS — BP 112/68 | HR 56 | Temp 98.3°F | Resp 14 | Ht 72.0 in | Wt 190.8 lb

## 2013-06-08 DIAGNOSIS — D351 Benign neoplasm of parathyroid gland: Secondary | ICD-10-CM

## 2013-06-08 DIAGNOSIS — Z09 Encounter for follow-up examination after completed treatment for conditions other than malignant neoplasm: Secondary | ICD-10-CM

## 2013-06-08 NOTE — Progress Notes (Signed)
Subjective:     Patient ID: Phillip Blair, male   DOB: 1956-08-18, 57 y.o.   MRN: [id number]  HPI He is here for another postop visit. He has no complaints.  Review of Systems     Objective:   Physical Exam On exam, there is no further swelling of the incision and it looks great. His calcium level had returned to normal but his parathyroid hormone level remained elevated    Assessment:     Patient stable postop     Plan:     I am going to see him back in 2 months and repeat his PTH and calcium level. If they remain elevated, I will need to repeat a sestamibi scan to see if there is a missed adenoma

## 2013-07-30 ENCOUNTER — Other Ambulatory Visit: Payer: Self-pay

## 2013-07-31 ENCOUNTER — Encounter (INDEPENDENT_AMBULATORY_CARE_PROVIDER_SITE_OTHER): Payer: Self-pay

## 2013-08-04 ENCOUNTER — Other Ambulatory Visit: Payer: Self-pay

## 2013-08-04 DIAGNOSIS — I10 Essential (primary) hypertension: Secondary | ICD-10-CM

## 2013-08-04 MED ORDER — AMLODIPINE BESYLATE 10 MG PO TABS: 10.0000 mg | ORAL_TABLET | Freq: Every day | ORAL | Status: DC

## 2013-08-04 NOTE — Telephone Encounter (Signed)
Last seen 04/28/13  FPW  If approved print for mail order and route to nurse

## 2013-08-04 NOTE — Telephone Encounter (Signed)
Last seen FPW  If approved print for mail order and route to nurse

## 2013-08-04 NOTE — Telephone Encounter (Signed)
Last seen 04/28/13 FPW  IF approved print for mail order and route to nurse

## 2013-08-10 ENCOUNTER — Other Ambulatory Visit (INDEPENDENT_AMBULATORY_CARE_PROVIDER_SITE_OTHER): Payer: Self-pay | Admitting: Surgery

## 2013-08-10 ENCOUNTER — Encounter (INDEPENDENT_AMBULATORY_CARE_PROVIDER_SITE_OTHER): Payer: Self-pay | Admitting: Surgery

## 2013-08-10 ENCOUNTER — Ambulatory Visit (INDEPENDENT_AMBULATORY_CARE_PROVIDER_SITE_OTHER): Payer: PRIVATE HEALTH INSURANCE | Admitting: Surgery

## 2013-08-10 VITALS — BP 124/72 | HR 60 | Temp 98.8°F | Resp 14 | Ht 72.0 in | Wt 190.2 lb

## 2013-08-10 DIAGNOSIS — Z9009 Acquired absence of other part of head and neck: Secondary | ICD-10-CM

## 2013-08-10 DIAGNOSIS — D351 Benign neoplasm of parathyroid gland: Secondary | ICD-10-CM

## 2013-08-10 DIAGNOSIS — Z9889 Other specified postprocedural states: Secondary | ICD-10-CM

## 2013-08-10 DIAGNOSIS — E892 Postprocedural hypoparathyroidism: Secondary | ICD-10-CM

## 2013-08-10 NOTE — Progress Notes (Signed)
Subjective:     Patient ID: KCI BAES, male   DOB: July 10, 1956, 57 y.o.   MRN: [id number]  HPI He is here for a followup visit. He has no complaints.  Review of Systems     Objective:   Physical Exam On exam, his incision is well-healed the neck and there are no palpable masses.  Unfortunately, his PTH level is greater than 170 and his calcium level is greater than 10    Assessment:     Patient status post parathyroidectomy with persistent elevated hormone levels and hypercalcemia     Plan:     After a long discussion with him, I believe that the offending adenoma may have been missed and the original plan removed was an enlarged superior parathyroid gland on the right. I believe he needs any other parathyroid scan preoperatively to make sure a separate area did not light up on the left side prior to proceeding to the operating room. The scan will help me as to whether this will be a minimally invasive attempt again at a parathyroidectomy versus a complete neck exploration. I will call him with the results of the scan.

## 2013-08-17 ENCOUNTER — Ambulatory Visit (HOSPITAL_COMMUNITY)
Admission: RE | Admit: 2013-08-17 | Discharge: 2013-08-17 | Disposition: A | Discharge status: Home / Self Care | Payer: PRIVATE HEALTH INSURANCE | Source: Ambulatory Visit | Attending: Surgery | Admitting: Surgery

## 2013-08-17 ENCOUNTER — Encounter (HOSPITAL_COMMUNITY)
Admission: RE | Admit: 2013-08-17 | Discharge: 2013-08-17 | Disposition: A | Discharge status: Home / Self Care | Payer: PRIVATE HEALTH INSURANCE | Source: Ambulatory Visit | Attending: Surgery | Admitting: Surgery

## 2013-08-17 DIAGNOSIS — Z9089 Acquired absence of other organs: Secondary | ICD-10-CM | POA: Insufficient documentation

## 2013-08-17 DIAGNOSIS — D351 Benign neoplasm of parathyroid gland: Secondary | ICD-10-CM

## 2013-08-17 DIAGNOSIS — E213 Hyperparathyroidism, unspecified: Secondary | ICD-10-CM | POA: Insufficient documentation

## 2013-08-17 MED ORDER — TECHNETIUM TC 99M SESTAMIBI GENERIC - CARDIOLITE
25.0000 | Freq: Once | INTRAVENOUS | Status: AC | PRN
  Administered 2013-08-17: 25 via INTRAVENOUS

## 2013-08-18 ENCOUNTER — Other Ambulatory Visit (INDEPENDENT_AMBULATORY_CARE_PROVIDER_SITE_OTHER): Payer: Self-pay | Admitting: Surgery

## 2013-08-19 ENCOUNTER — Telehealth (INDEPENDENT_AMBULATORY_CARE_PROVIDER_SITE_OTHER): Payer: Self-pay | Admitting: Surgery

## 2013-08-19 NOTE — Telephone Encounter (Signed)
07/2613 I spoke with patient regarding scheduling. He is not ready for surgery at this time. He is aware orders are good for 90 days and will call me back when ready. skm

## 2013-08-28 ENCOUNTER — Encounter: Payer: Self-pay | Admitting: Family Medicine

## 2013-08-28 ENCOUNTER — Ambulatory Visit (INDEPENDENT_AMBULATORY_CARE_PROVIDER_SITE_OTHER): Payer: PRIVATE HEALTH INSURANCE | Admitting: Family Medicine

## 2013-08-28 VITALS — BP 133/78 | HR 62 | Temp 98.4°F | Ht 71.0 in | Wt 189.6 lb

## 2013-08-28 DIAGNOSIS — R739 Hyperglycemia, unspecified: Secondary | ICD-10-CM

## 2013-08-28 DIAGNOSIS — M109 Gout, unspecified: Secondary | ICD-10-CM

## 2013-08-28 DIAGNOSIS — E785 Hyperlipidemia, unspecified: Secondary | ICD-10-CM

## 2013-08-28 DIAGNOSIS — G4733 Obstructive sleep apnea (adult) (pediatric): Secondary | ICD-10-CM

## 2013-08-28 DIAGNOSIS — R7309 Other abnormal glucose: Secondary | ICD-10-CM

## 2013-08-28 DIAGNOSIS — K219 Gastro-esophageal reflux disease without esophagitis: Secondary | ICD-10-CM

## 2013-08-28 DIAGNOSIS — N189 Chronic kidney disease, unspecified: Secondary | ICD-10-CM

## 2013-08-28 DIAGNOSIS — I10 Essential (primary) hypertension: Secondary | ICD-10-CM

## 2013-08-28 DIAGNOSIS — D351 Benign neoplasm of parathyroid gland: Secondary | ICD-10-CM

## 2013-08-28 LAB — POCT GLYCOSYLATED HEMOGLOBIN (HGB A1C): Hemoglobin A1C: 5.6

## 2013-08-28 MED ORDER — LOSARTAN POTASSIUM 100 MG PO TABS: 100.0000 mg | ORAL_TABLET | Freq: Every day | ORAL | Status: DC

## 2013-08-28 MED ORDER — AMLODIPINE BESYLATE 10 MG PO TABS: 10.0000 mg | ORAL_TABLET | Freq: Every day | ORAL | Status: DC

## 2013-08-28 MED ORDER — ALLOPURINOL 300 MG PO TABS: 300.0000 mg | ORAL_TABLET | Freq: Every day | ORAL | Status: DC

## 2013-08-28 NOTE — Progress Notes (Signed)
Patient ID: Phillip Blair, male   DOB: 15-Aug-1956, 57 y.o.   MRN: [id number] SUBJECTIVE: CC: Chief Complaint  Patient presents with  . Follow-up    4 month follow up     HPI:  Patient is here for follow up of hyperlipidemia/htn/hyperglycemia/hyperparathyroidism/gout: denies Headache;denies Chest Pain;denies weakness;denies Shortness of Breath and orthopnea;denies Visual changes;denies palpitations;denies cough;denies pedal edema;denies symptoms of TIA or stroke;deniesClaudication symptoms. admits to Compliance with medications; denies Problems with medications.  Discussed with patient that the recent minimally invasive surgery did not get the hyperparathyroid adenoma and the need for repeat surgery is the way to go. He wants to postpone due to international travel and the [place] holidays. I agree that this is not life and  Death so he could wait a month for this. He needs to have adequate hydration.  Past Medical History  Diagnosis Date  . Allergy   . Hyperlipidemia   . Chronic kidney disease   . GERD (gastroesophageal reflux disease)   . Gout   . Sleep apnea     uses CPAP  . Hypertension     does not see a cardiologist  . Parathyroid adenoma   . Hypercalcemia    Past Surgical History  Procedure Laterality Date  . Vasectomy    . Nasal septoplasty w/ turbinoplasty    . Minimally invasive radioactive parathyroidectomy N/A 05/04/2013    Procedure: PARATHYROIDECTOMY MINIMALLY INVASIVE;  Surgeon: Harl Bowie, MD;  Location: [facility];  Service: General;  Laterality: N/A;   History   Social History  . Marital Status: Married    Spouse Name: N/A    Number of Children: N/A  . Years of Education: N/A   Occupational History  . Not on file.   Social History Main Topics  . Smoking status: Never Smoker   . Smokeless tobacco: Never Used  . Alcohol Use: No  . Drug Use: No  . Sexual Activity: Not on file   Other Topics Concern  . Not on file   Social History Narrative   . No narrative on file   No family history on file. Current Outpatient Prescriptions on File Prior to Visit  Medication Sig Dispense Refill  . fish oil-omega-3 fatty acids 1000 MG capsule Take 1 capsule by mouth daily.      . Flaxseed, Linseed, (COLD MILLED GOLDEN FLAX SEED) POWD Take 15 mLs by mouth. Note 1 tablespoon/ day       No current facility-administered medications on file prior to visit.   No Known Allergies Immunization History  Administered Date(s) Administered  . Influenza Split 07/09/2011, 08/11/2012  . Influenza Whole 07/26/2010  . Influenza-Unspecified 08/28/2013  . Tdap 08/10/2011   Prior to Admission medications   Medication Sig Start Date End Date Taking? Authorizing Provider  allopurinol (ZYLOPRIM) 300 MG tablet Take 1 tablet (300 mg total) by mouth daily. 08/11/12  Yes Marletta Lor, MD  amLODipine (NORVASC) 10 MG tablet Take 1 tablet (10 mg total) by mouth daily. 08/04/13  Yes Mary-Margaret Hassell Done, FNP  fish oil-omega-3 fatty acids 1000 MG capsule Take 1 capsule by mouth daily.   Yes Historical Provider, MD  Flaxseed, Linseed, (COLD MILLED GOLDEN FLAX SEED) POWD Take 15 mLs by mouth. Note 1 tablespoon/ day   Yes Historical Provider, MD  losartan (COZAAR) 100 MG tablet Take 1 tablet (100 mg total) by mouth daily. 01/14/13  Yes [name], MD     ROS: As above in the HPI. All other systems are stable  or negative.  OBJECTIVE: APPEARANCE:  Patient in no acute distress.The patient appeared well nourished and normally developed. Acyanotic. Waist: VITAL SIGNS:BP 133/78  Pulse 62  Temp(Src) 98.4 F (36.9 C) (Oral)  Ht 5\' 11"  (1.803 m)  Wt 189 lb 9.6 oz (86.002 kg)  BMI 26.46 kg/m2 [place] Male  SKIN: warm and  Dry without overt rashes, tattoos and scars  HEAD and Neck: without JVD, Head and scalp: normal Eyes:No scleral icterus. Fundi normal, eye movements normal. Ears: Auricle normal, canal normal, Tympanic membranes normal, insufflation  normal. Nose: normal Throat: normal Neck & thyroid: normal  CHEST & LUNGS: Chest wall: normal Lungs: Clear  CVS: Reveals the PMI to be normally located. Regular rhythm, First and Second Heart sounds are normal,  absence of murmurs, rubs or gallops. Peripheral vasculature: Radial pulses: normal Dorsal pedis pulses: normal Posterior pulses: normal  ABDOMEN:  Appearance: normal Benign, no organomegaly, no masses, no Abdominal Aortic enlargement. No Guarding , no rebound. No Bruits. Bowel sounds: normal  RECTAL: N/A GU: N/A  EXTREMETIES: nonedematous.  MUSCULOSKELETAL:  Spine: normal Joints: intact  NEUROLOGIC: oriented to time,place and person; nonfocal. Strength is normal Sensory is normal Reflexes are normal Cranial Nerves are normal. Results for orders placed in visit on 08/28/13  POCT GLYCOSYLATED HEMOGLOBIN (HGB A1C)      Result Value Range   Hemoglobin A1C 5.6      ASSESSMENT:  GERD  SLEEP APNEA, OBSTRUCTIVE, SEVERE  HYPERTENSION - Plan: CMP14+EGFR, losartan (COZAAR) 100 MG tablet, amLODipine (NORVASC) 10 MG tablet  Hyperglycemia - Plan: POCT glycosylated hemoglobin (Hb A1C)  HLD (hyperlipidemia) - Plan: NMR, lipoprofile  GOUT - Plan: Uric acid, allopurinol (ZYLOPRIM) 300 MG tablet  Chronic kidney disease  Parathyroid adenoma  PLAN: Reviewed my recommendations for healthy lifestyle which the patient has embarked on.       Dr Paula Libra Recommendations  For nutrition information, I recommend books:  1).Eat to Live by Dr Excell Seltzer. 2).Prevent and Reverse Heart Disease by Dr Karl Luke. 3) Dr Janene Harvey Book:  Program to Reverse Diabetes  Exercise recommendations are:  If unable to walk, then the patient can exercise in a chair 3 times a day. By flapping arms like a bird gently and raising legs outwards to the front.  If ambulatory, the patient can go for walks for 30 minutes 3 times a week. Then increase the intensity  and duration as tolerated.  Goal is to try to attain exercise frequency to 5 times a week.  If applicable: Best to perform resistance exercises (machines or weights) 2 days a week and cardio type exercises 3 days per week.  Orders Placed This Encounter  Procedures  . CMP14+EGFR  . NMR, lipoprofile  . Uric acid  . POCT glycosylated hemoglobin (Hb A1C)   Meds ordered this encounter  Medications  . losartan (COZAAR) 100 MG tablet    Sig: Take 1 tablet (100 mg total) by mouth daily.    Dispense:  90 tablet    Refill:  6  . allopurinol (ZYLOPRIM) 300 MG tablet    Sig: Take 1 tablet (300 mg total) by mouth daily.    Dispense:  90 tablet    Refill:  6  . amLODipine (NORVASC) 10 MG tablet    Sig: Take 1 tablet (10 mg total) by mouth daily.    Dispense:  90 tablet    Refill:  6   Medications Discontinued During This Encounter  Medication Reason  . cephALEXin (KEFLEX) 500 MG  capsule Completed Course  . losartan (COZAAR) 100 MG tablet Reorder  . allopurinol (ZYLOPRIM) 300 MG tablet Reorder  . amLODipine (NORVASC) 10 MG tablet Reorder   Return in about 4 months (around 12/27/2013) for Recheck medical problems.  Sophia Cubero P. Jacelyn Grip, M.D.

## 2013-08-30 LAB — CMP14+EGFR
ALT: 22 IU/L (ref 0–44)
AST: 19 IU/L (ref 0–40)
Albumin/Globulin Ratio: 2.1 (ref 1.1–2.5)
Albumin: 4.5 g/dL (ref 3.5–5.5)
Alkaline Phosphatase: 106 IU/L (ref 39–117)
BUN/Creatinine Ratio: 21 — ABNORMAL HIGH (ref 9–20)
BUN: 32 mg/dL — ABNORMAL HIGH (ref 6–24)
CO2: 21 mmol/L (ref 18–29)
Calcium: 11.3 mg/dL — ABNORMAL HIGH (ref 8.7–10.2)
Chloride: 106 mmol/L (ref 97–108)
Creatinine, Ser: 1.53 mg/dL — ABNORMAL HIGH (ref 0.76–1.27)
GFR calc Af Amer: 58 mL/min/{1.73_m2} — ABNORMAL LOW (ref >59)
GFR calc non Af Amer: 50 mL/min/{1.73_m2} — ABNORMAL LOW (ref >59)
Globulin, Total: 2.1 g/dL (ref 1.5–4.5)
Glucose: 94 mg/dL (ref 65–99)
Potassium: 4.5 mmol/L (ref 3.5–5.2)
Sodium: 142 mmol/L (ref 134–144)
Total Bilirubin: 0.7 mg/dL (ref 0.0–1.2)
Total Protein: 6.6 g/dL (ref 6.0–8.5)

## 2013-08-30 LAB — NMR, LIPOPROFILE
Cholesterol: 219 mg/dL — ABNORMAL HIGH (ref <200)
HDL Cholesterol by NMR: 62 mg/dL (ref >=40)
HDL Particle Number: 33.6 umol/L (ref >=30.5)
LDL Particle Number: 1806 nmol/L — ABNORMAL HIGH (ref <1000)
LDL Size: 20.6 nm (ref >20.5)
LDLC SERPL CALC-MCNC: 135 mg/dL — ABNORMAL HIGH (ref <100)
LP-IR Score: 40 (ref <=45)
Small LDL Particle Number: 735 nmol/L — ABNORMAL HIGH (ref <=527)
Triglycerides by NMR: 110 mg/dL (ref <150)

## 2013-08-30 LAB — URIC ACID: Uric Acid: 4.7 mg/dL (ref 3.7–8.6)

## 2013-08-30 NOTE — Progress Notes (Signed)
Quick Note:  Call Patient Labs that are abnormal: Calcium still high as he knows. The LDLc is going up. This may be from the Thanksgiving holiday.I suspect that he does need to go on a low dose Statin, to prevent heart disease. Will start on low dose pravastatin 10 mg daily Uric acid is well controlled The HGBA1C is great. Not DM.  The rest are at goal      ______

## 2013-09-15 ENCOUNTER — Telehealth: Payer: Self-pay | Admitting: *Deleted

## 2013-09-15 NOTE — Telephone Encounter (Signed)
Message copied by Priscille Heidelberg on Tue Sep 15, 2013  9:23 AM ------      Message from: [name]      Created: Sun Aug 30, 2013  9:18 AM       Call Patient      Labs that are abnormal:      Calcium still high as he knows.      The LDLc is going up. This may be from the Thanksgiving holiday.I suspect that he does need to go on a low dose  Statin, to prevent heart disease.      Will start on low dose pravastatin 10 mg daily      Uric acid is well controlled      The HGBA1C is great. Not DM.            The rest are at goal                               ------

## 2013-09-29 ENCOUNTER — Other Ambulatory Visit: Payer: Self-pay | Admitting: *Deleted

## 2013-09-29 MED ORDER — PRAVASTATIN SODIUM 10 MG PO TABS: 10.0000 mg | ORAL_TABLET | Freq: Every day | ORAL | Status: DC

## 2013-09-29 NOTE — Telephone Encounter (Signed)
PT AWARE OF LAB RESULTS.  HE WANT TO KNOW IF PREVASTATIN 10 MG HAS BEEN CALLED TO HIS PHARMACY--[facility] IN [place].  PLEASE CALL AND LET HIM KNOW THIS HAS BEEN CALLED [phone]   RS

## 2013-09-29 NOTE — Telephone Encounter (Signed)
Medication was sent electronically. Patient notified.

## 2013-09-30 ENCOUNTER — Other Ambulatory Visit (INDEPENDENT_AMBULATORY_CARE_PROVIDER_SITE_OTHER): Payer: Self-pay | Admitting: Surgery

## 2013-10-08 ENCOUNTER — Encounter (HOSPITAL_COMMUNITY): Payer: Self-pay | Admitting: Pharmacy Technician

## 2013-10-12 ENCOUNTER — Encounter: Payer: Self-pay | Admitting: *Deleted

## 2013-10-13 NOTE — Pre-Procedure Instructions (Addendum)
Phillip Blair  10/13/2013   Your procedure is scheduled on:  Tuesday, October 20, 2013 @ 12:24 PM  Report to [facility] Short Stay (use Main Entrance "A'') at 930 AM.  Call this number if you have problems the morning of surgery: [phone]   Remember:   Do not eat food or drink liquids after midnight.   Take these medicines the morning of surgery with A SIP OF WATER: allopurinol (ZYLOPRIM) 300 MG tablet, amLODipine (NORVASC) 10 MG tablet  Stop taking Aspirin and herbal medications (fish oil-omega-3 fatty acids 1000 MG capsule, Flaxseed, Linseed, (COLD MILLED GOLDEN FLAX SEED) POWD  Do not take any NSAIDs ie: Ibuprofen, Advil, Naproxen or any medication containing Aspirin.   Do not wear jewelry, make-up or nail polish.  Do not wear lotions, powders, or perfumes. You may wear deodorant.  Do not shave 48 hours prior to surgery. Men may shave face and neck.  Do not bring valuables to the hospital.  [facility] is not responsible   for any belongings or valuables.               Contacts, dentures or bridgework may not be worn into surgery.  Leave suitcase in the car. After surgery it may be brought to your room.  For patients admitted to the hospital, discharge time is determined by your  treatment team.               Patients discharged the day of surgery will not be allowed to drive home.  Name and phone number of your driver:   Special Instructions: Shower using CHG 2 nights before surgery and the night before surgery.  If you shower the day of surgery use CHG.  Use special wash - you have one bottle of CHG for all showers.  You should use approximately 1/3 of the bottle for each shower.   Please read over the following fact sheets that you were given: Pain Booklet, Coughing and Deep Breathing and Surgical Site Infection Prevention

## 2013-10-14 ENCOUNTER — Encounter (HOSPITAL_COMMUNITY)
Admission: RE | Admit: 2013-10-14 | Discharge: 2013-10-14 | Disposition: A | Discharge status: Home / Self Care | Payer: PRIVATE HEALTH INSURANCE | Source: Ambulatory Visit | Attending: Surgery | Admitting: Surgery

## 2013-10-14 ENCOUNTER — Encounter (HOSPITAL_COMMUNITY): Payer: Self-pay

## 2013-10-14 ENCOUNTER — Other Ambulatory Visit (HOSPITAL_COMMUNITY): Payer: Self-pay | Admitting: *Deleted

## 2013-10-14 DIAGNOSIS — Z01818 Encounter for other preprocedural examination: Secondary | ICD-10-CM | POA: Insufficient documentation

## 2013-10-14 DIAGNOSIS — Z01812 Encounter for preprocedural laboratory examination: Secondary | ICD-10-CM | POA: Insufficient documentation

## 2013-10-14 LAB — BASIC METABOLIC PANEL
BUN: 28 mg/dL — ABNORMAL HIGH (ref 6–23)
CO2: 24 mEq/L (ref 19–32)
Calcium: 10.6 mg/dL — ABNORMAL HIGH (ref 8.4–10.5)
Chloride: 105 mEq/L (ref 96–112)
Creatinine, Ser: 1.44 mg/dL — ABNORMAL HIGH (ref 0.50–1.35)
GFR calc Af Amer: 61 mL/min — ABNORMAL LOW (ref >90)
GFR, EST NON AFRICAN AMERICAN: 53 mL/min — AB (ref >90)
Glucose, Bld: 124 mg/dL — ABNORMAL HIGH (ref 70–99)
Potassium: 4.1 mEq/L (ref 3.7–5.3)
SODIUM: 141 meq/L (ref 137–147)

## 2013-10-14 LAB — CBC
HEMATOCRIT: 42.1 % (ref 39.0–52.0)
Hemoglobin: 14 g/dL (ref 13.0–17.0)
MCH: 29.8 pg (ref 26.0–34.0)
MCHC: 33.3 g/dL (ref 30.0–36.0)
MCV: 89.6 fL (ref 78.0–100.0)
PLATELETS: 199 10*3/uL (ref 150–400)
RBC: 4.7 MIL/uL (ref 4.22–5.81)
RDW: 13.8 % (ref 11.5–15.5)
WBC: 6.4 10*3/uL (ref 4.0–10.5)

## 2013-10-19 MED ORDER — CEFAZOLIN SODIUM-DEXTROSE 2-3 GM-% IV SOLR
2.0000 g | INTRAVENOUS | Status: AC
  Administered 2013-10-20: 2 g via INTRAVENOUS
  Filled 2013-10-19: qty 50

## 2013-10-19 NOTE — H&P (Signed)
Phillip Blair is an 58 y.o. male.   Chief Complaint: Right parathyroid adenoma HPI: This is Blair gentleman who has undergone Blair previous Minimally invasive parathyroidectomy. This was for Blair right sided adenoma.  The adenoma was presumed to have been removed at surgery and seemed to be confirmed on frozen section. However, his calcium remained elevated postoperatively and he has since had another sestamibi scan demonstrating the adenoma was still present. He has no other complaints.  Past Medical History  Diagnosis Date  . Allergy   . Hyperlipidemia   . Gout   . Sleep apnea     uses CPAP  . Hypertension     does not see Blair cardiologist  . Parathyroid adenoma   . Hypercalcemia   . GERD (gastroesophageal reflux disease)     occ  . Chronic kidney disease     denies    Past Surgical History  Procedure Laterality Date  . Vasectomy    . Nasal septoplasty w/ turbinoplasty    . Minimally invasive radioactive parathyroidectomy N/Blair 05/04/2013    Procedure: PARATHYROIDECTOMY MINIMALLY INVASIVE;  Surgeon: Phillip Bowie, MD;  Location: [facility];  Service: General;  Laterality: N/Blair;    No family history on file. Social History:  reports that he has never smoked. He has never used smokeless tobacco. He reports that he does not drink alcohol or use illicit drugs.  Allergies: No Known Allergies  No prescriptions prior to admission    No results found for this or any previous visit (from the past 48 hour(s)). No results found.  Review of Systems  All other systems reviewed and are negative.    There were no vitals taken for this visit. Physical Exam  Constitutional: He is oriented to person, place, and time. He appears well-developed and well-nourished.  HENT:  Head: Normocephalic and atraumatic.  Right Ear: External ear normal.  Left Ear: External ear normal.  Nose: Nose normal.  Eyes: Conjunctivae are normal. Pupils are equal, round, and reactive to light. No scleral icterus.   Neck: Normal range of motion. Neck supple. No tracheal deviation present.  Well-healed neck incision without palpable mass  Cardiovascular: Normal rate, regular rhythm, normal heart sounds and intact distal pulses.   No murmur heard. Respiratory: Effort normal and breath sounds normal. No respiratory distress. He has no wheezes.  GI: Soft. Bowel sounds are normal. There is no tenderness.  Musculoskeletal: Normal range of motion.  Neurological: He is alert and oriented to person, place, and time.  Skin: Skin is warm and dry.  Psychiatric: His behavior is normal.     Assessment/Plan Parathyroid adenoma  I suspect the adenoma was missed at the time of surgery. On further review the films, this may be lower in the neck. We will proceed back to the operating room for Blair reattempt at Blair minimally invasive parathyroidectomy. The risks of surgery were explained to him in detail. These again include but are not limited to bleeding, infection, injury to shredding structures including the nerve to the vocal cords, need for Blair complete neck dissection and evaluation of all the parathyroid glands should the adenoma not be found, need for partial removal of the thyroid gland, et Phillip Blair. I again discussed postoperative recovery. Surgery is scheduled  Phillip Blair 10/19/2013, 8:18 AM

## 2013-10-20 ENCOUNTER — Encounter (HOSPITAL_COMMUNITY)
Admission: RE | Disposition: A | Discharge status: Home / Self Care | Payer: Self-pay | Source: Ambulatory Visit | Attending: Surgery

## 2013-10-20 ENCOUNTER — Encounter (HOSPITAL_COMMUNITY): Payer: PRIVATE HEALTH INSURANCE | Admitting: Anesthesiology

## 2013-10-20 ENCOUNTER — Observation Stay (HOSPITAL_COMMUNITY)
Admission: RE | Admit: 2013-10-20 | Discharge: 2013-10-21 | Disposition: A | Discharge status: Home / Self Care | Payer: PRIVATE HEALTH INSURANCE | Source: Ambulatory Visit | Attending: Surgery | Admitting: Surgery

## 2013-10-20 ENCOUNTER — Encounter (HOSPITAL_COMMUNITY): Payer: Self-pay | Admitting: Certified Registered Nurse Anesthetist

## 2013-10-20 ENCOUNTER — Ambulatory Visit (HOSPITAL_COMMUNITY): Payer: PRIVATE HEALTH INSURANCE | Admitting: Anesthesiology

## 2013-10-20 DIAGNOSIS — I129 Hypertensive chronic kidney disease with stage 1 through stage 4 chronic kidney disease, or unspecified chronic kidney disease: Secondary | ICD-10-CM | POA: Insufficient documentation

## 2013-10-20 DIAGNOSIS — G4730 Sleep apnea, unspecified: Secondary | ICD-10-CM | POA: Insufficient documentation

## 2013-10-20 DIAGNOSIS — K219 Gastro-esophageal reflux disease without esophagitis: Secondary | ICD-10-CM | POA: Insufficient documentation

## 2013-10-20 DIAGNOSIS — E210 Primary hyperparathyroidism: Secondary | ICD-10-CM

## 2013-10-20 DIAGNOSIS — N189 Chronic kidney disease, unspecified: Secondary | ICD-10-CM | POA: Insufficient documentation

## 2013-10-20 DIAGNOSIS — M109 Gout, unspecified: Secondary | ICD-10-CM | POA: Insufficient documentation

## 2013-10-20 DIAGNOSIS — D351 Benign neoplasm of parathyroid gland: Principal | ICD-10-CM | POA: Insufficient documentation

## 2013-10-20 DIAGNOSIS — E785 Hyperlipidemia, unspecified: Secondary | ICD-10-CM | POA: Insufficient documentation

## 2013-10-20 HISTORY — PX: MINIMALLY INVASIVE RADIOACTIVE PARATHYROIDECTOMY: SHX5272

## 2013-10-20 SURGERY — PARATHYROIDECTOMY, MINIMALLY INVASIVE, WITH INTRAOPERATIVE RADIONUCLIDE GUIDANCE
Anesthesia: General | Site: Neck

## 2013-10-20 MED ORDER — ENOXAPARIN SODIUM 40 MG/0.4ML ~~LOC~~ SOLN
40.0000 mg | SUBCUTANEOUS | Status: DC
  Filled 2013-10-20: qty 0.4

## 2013-10-20 MED ORDER — EPHEDRINE SULFATE 50 MG/ML IJ SOLN
INTRAMUSCULAR | Status: DC | PRN
  Administered 2013-10-20: 10 mg via INTRAVENOUS
  Administered 2013-10-20: 5 mg via INTRAVENOUS
  Administered 2013-10-20: 10 mg via INTRAVENOUS
  Administered 2013-10-20: 5 mg via INTRAVENOUS

## 2013-10-20 MED ORDER — STERILE WATER FOR INJECTION IJ SOLN
INTRAMUSCULAR | Status: AC
  Filled 2013-10-20: qty 10

## 2013-10-20 MED ORDER — ONDANSETRON HCL 4 MG/2ML IJ SOLN
INTRAMUSCULAR | Status: AC
  Filled 2013-10-20: qty 2

## 2013-10-20 MED ORDER — LOSARTAN POTASSIUM 50 MG PO TABS
100.0000 mg | ORAL_TABLET | Freq: Every day | ORAL | Status: DC
  Administered 2013-10-20 – 2013-10-21 (×2): 100 mg via ORAL
  Filled 2013-10-20 (×2): qty 2

## 2013-10-20 MED ORDER — ONDANSETRON HCL 4 MG/2ML IJ SOLN
INTRAMUSCULAR | Status: DC | PRN
  Administered 2013-10-20: 4 mg via INTRAVENOUS

## 2013-10-20 MED ORDER — EPHEDRINE SULFATE 50 MG/ML IJ SOLN
INTRAMUSCULAR | Status: AC
  Filled 2013-10-20: qty 1

## 2013-10-20 MED ORDER — OXYCODONE HCL 5 MG/5ML PO SOLN: 5.0000 mg | Freq: Once | ORAL | Status: DC | PRN

## 2013-10-20 MED ORDER — FENTANYL CITRATE 0.05 MG/ML IJ SOLN
INTRAMUSCULAR | Status: DC | PRN
  Administered 2013-10-20: 100 ug via INTRAVENOUS
  Administered 2013-10-20 (×3): 50 ug via INTRAVENOUS

## 2013-10-20 MED ORDER — HEMOSTATIC AGENTS (NO CHARGE) OPTIME
TOPICAL | Status: DC | PRN
  Administered 2013-10-20: 1 via TOPICAL

## 2013-10-20 MED ORDER — ONDANSETRON HCL 4 MG PO TABS: 4.0000 mg | ORAL_TABLET | Freq: Four times a day (QID) | ORAL | Status: DC | PRN

## 2013-10-20 MED ORDER — ONDANSETRON HCL 4 MG/2ML IJ SOLN: 4.0000 mg | Freq: Four times a day (QID) | INTRAMUSCULAR | Status: DC | PRN

## 2013-10-20 MED ORDER — PROPOFOL 10 MG/ML IV BOLUS
INTRAVENOUS | Status: AC
  Filled 2013-10-20: qty 20

## 2013-10-20 MED ORDER — HYDROMORPHONE HCL PF 1 MG/ML IJ SOLN
0.2500 mg | INTRAMUSCULAR | Status: DC | PRN
  Administered 2013-10-20 (×3): 0.5 mg via INTRAVENOUS

## 2013-10-20 MED ORDER — 0.9 % SODIUM CHLORIDE (POUR BTL) OPTIME
TOPICAL | Status: DC | PRN
  Administered 2013-10-20: 1000 mL

## 2013-10-20 MED ORDER — OXYCODONE HCL 5 MG PO TABS: 5.0000 mg | ORAL_TABLET | Freq: Once | ORAL | Status: DC | PRN

## 2013-10-20 MED ORDER — AMLODIPINE BESYLATE 10 MG PO TABS
10.0000 mg | ORAL_TABLET | Freq: Every day | ORAL | Status: DC
  Administered 2013-10-21: 10 mg via ORAL
  Filled 2013-10-20: qty 1

## 2013-10-20 MED ORDER — BUPIVACAINE-EPINEPHRINE 0.5% -1:200000 IJ SOLN: INTRAMUSCULAR | Status: DC | PRN

## 2013-10-20 MED ORDER — LIDOCAINE HCL (CARDIAC) 20 MG/ML IV SOLN
INTRAVENOUS | Status: DC | PRN
  Administered 2013-10-20: 80 mg via INTRAVENOUS

## 2013-10-20 MED ORDER — MIDAZOLAM HCL 5 MG/5ML IJ SOLN
INTRAMUSCULAR | Status: DC | PRN
  Administered 2013-10-20: 2 mg via INTRAVENOUS

## 2013-10-20 MED ORDER — POTASSIUM CHLORIDE IN NACL 20-0.9 MEQ/L-% IV SOLN
INTRAVENOUS | Status: DC
  Administered 2013-10-20 – 2013-10-21 (×2): via INTRAVENOUS
  Filled 2013-10-20 (×3): qty 1000

## 2013-10-20 MED ORDER — FENTANYL CITRATE 0.05 MG/ML IJ SOLN
INTRAMUSCULAR | Status: AC
  Filled 2013-10-20: qty 5

## 2013-10-20 MED ORDER — NEOSTIGMINE METHYLSULFATE 1 MG/ML IJ SOLN
INTRAMUSCULAR | Status: AC
  Filled 2013-10-20: qty 10

## 2013-10-20 MED ORDER — PROPOFOL 10 MG/ML IV BOLUS
INTRAVENOUS | Status: DC | PRN
  Administered 2013-10-20: 180 mg via INTRAVENOUS

## 2013-10-20 MED ORDER — PHENYLEPHRINE HCL 10 MG/ML IJ SOLN
INTRAMUSCULAR | Status: DC | PRN
  Administered 2013-10-20 (×4): 40 ug via INTRAVENOUS

## 2013-10-20 MED ORDER — OXYCODONE-ACETAMINOPHEN 5-325 MG PO TABS
1.0000 | ORAL_TABLET | ORAL | Status: DC | PRN
  Administered 2013-10-20 – 2013-10-21 (×3): 2 via ORAL
  Filled 2013-10-20 (×3): qty 2

## 2013-10-20 MED ORDER — LACTATED RINGERS IV SOLN
INTRAVENOUS | Status: DC | PRN
  Administered 2013-10-20 (×2): via INTRAVENOUS

## 2013-10-20 MED ORDER — ROCURONIUM BROMIDE 50 MG/5ML IV SOLN
INTRAVENOUS | Status: AC
  Filled 2013-10-20: qty 1

## 2013-10-20 MED ORDER — HYDROMORPHONE HCL PF 1 MG/ML IJ SOLN
INTRAMUSCULAR | Status: AC
  Filled 2013-10-20: qty 1

## 2013-10-20 MED ORDER — MORPHINE SULFATE 4 MG/ML IJ SOLN: 4.0000 mg | INTRAMUSCULAR | Status: DC | PRN

## 2013-10-20 MED ORDER — MIDAZOLAM HCL 2 MG/2ML IJ SOLN
INTRAMUSCULAR | Status: AC
  Filled 2013-10-20: qty 2

## 2013-10-20 MED ORDER — PHENYLEPHRINE 40 MCG/ML (10ML) SYRINGE FOR IV PUSH (FOR BLOOD PRESSURE SUPPORT)
PREFILLED_SYRINGE | INTRAVENOUS | Status: AC
Start: 2013-10-20 — End: 2013-10-20
  Filled 2013-10-20: qty 10

## 2013-10-20 MED ORDER — LIDOCAINE HCL (CARDIAC) 20 MG/ML IV SOLN
INTRAVENOUS | Status: AC
  Filled 2013-10-20: qty 5

## 2013-10-20 MED ORDER — METHYLENE BLUE 1 % INJ SOLN
INTRAMUSCULAR | Status: AC
  Filled 2013-10-20: qty 10

## 2013-10-20 MED ORDER — NEOSTIGMINE METHYLSULFATE 1 MG/ML IJ SOLN
INTRAMUSCULAR | Status: DC | PRN
  Administered 2013-10-20: 4 mg via INTRAVENOUS

## 2013-10-20 MED ORDER — ALLOPURINOL 300 MG PO TABS
300.0000 mg | ORAL_TABLET | Freq: Every day | ORAL | Status: DC
  Administered 2013-10-21: 300 mg via ORAL
  Filled 2013-10-20: qty 1

## 2013-10-20 MED ORDER — GLYCOPYRROLATE 0.2 MG/ML IJ SOLN
INTRAMUSCULAR | Status: AC
  Filled 2013-10-20: qty 3

## 2013-10-20 MED ORDER — LACTATED RINGERS IV SOLN
INTRAVENOUS | Status: DC
  Administered 2013-10-20: 11:00:00 via INTRAVENOUS

## 2013-10-20 MED ORDER — ROCURONIUM BROMIDE 100 MG/10ML IV SOLN
INTRAVENOUS | Status: DC | PRN
  Administered 2013-10-20: 30 mg via INTRAVENOUS

## 2013-10-20 MED ORDER — GLYCOPYRROLATE 0.2 MG/ML IJ SOLN
INTRAMUSCULAR | Status: DC | PRN
  Administered 2013-10-20: .6 mg via INTRAVENOUS

## 2013-10-20 SURGICAL SUPPLY — 66 items
APL SKNCLS STERI-STRIP NONHPOA (GAUZE/BANDAGES/DRESSINGS) ×1
BENZOIN TINCTURE PRP APPL 2/3 (GAUZE/BANDAGES/DRESSINGS) ×2 IMPLANT
BLADE SURG 15 STRL LF DISP TIS (BLADE) ×1 IMPLANT
BLADE SURG 15 STRL SS (BLADE) ×2
BLADE SURG ROTATE 9660 (MISCELLANEOUS) IMPLANT
CANISTER SUCTION 2500CC (MISCELLANEOUS) ×2 IMPLANT
CHLORAPREP W/TINT 10.5 ML (MISCELLANEOUS) ×2 IMPLANT
CLIP TI MEDIUM 24 (CLIP) ×1 IMPLANT
CLIP TI MEDIUM 6 (CLIP) ×1 IMPLANT
CLIP TI WIDE RED SMALL 24 (CLIP) ×1 IMPLANT
CLIP TI WIDE RED SMALL 6 (CLIP) ×1 IMPLANT
CLSR STERI-STRIP ANTIMIC 1/2X4 (GAUZE/BANDAGES/DRESSINGS) ×1 IMPLANT
CONT SPEC 4OZ CLIKSEAL STRL BL (MISCELLANEOUS) ×9 IMPLANT
COVER SURGICAL LIGHT HANDLE (MISCELLANEOUS) ×2 IMPLANT
COVER TRANSDUCER ULTRASND GEL (DRAPE) ×1 IMPLANT
CRADLE DONUT ADULT HEAD (MISCELLANEOUS) ×2 IMPLANT
DECANTER SPIKE VIAL GLASS SM (MISCELLANEOUS) ×1 IMPLANT
DRAPE PED LAPAROTOMY (DRAPES) ×2 IMPLANT
DRAPE UTILITY 15X26 W/TAPE STR (DRAPE) ×4 IMPLANT
DRSG TEGADERM 4X4.75 (GAUZE/BANDAGES/DRESSINGS) ×2 IMPLANT
ELECT CAUTERY BLADE 6.4 (BLADE) ×4 IMPLANT
ELECT REM PT RETURN 9FT ADLT (ELECTROSURGICAL) ×2
ELECTRODE REM PT RTRN 9FT ADLT (ELECTROSURGICAL) ×1 IMPLANT
GAUZE SPONGE 2X2 8PLY STRL LF (GAUZE/BANDAGES/DRESSINGS) ×1 IMPLANT
GAUZE SPONGE 4X4 16PLY XRAY LF (GAUZE/BANDAGES/DRESSINGS) ×4 IMPLANT
GLOVE BIO SURGEON STRL SZ7.5 (GLOVE) ×2 IMPLANT
GLOVE BIOGEL PI IND STRL 7.0 (GLOVE) IMPLANT
GLOVE BIOGEL PI IND STRL 7.5 (GLOVE) IMPLANT
GLOVE BIOGEL PI INDICATOR 7.0 (GLOVE) ×1
GLOVE BIOGEL PI INDICATOR 7.5 (GLOVE) ×1
GLOVE SURG ORTHO 8.0 STRL STRW (GLOVE) ×2 IMPLANT
GLOVE SURG SIGNA 7.5 PF LTX (GLOVE) ×2 IMPLANT
GLOVE SURG SS PI 7.0 STRL IVOR (GLOVE) ×1 IMPLANT
GLOVE SURG SS PI 7.5 STRL IVOR (GLOVE) ×1 IMPLANT
GOWN PREVENTION PLUS XLARGE (GOWN DISPOSABLE) ×2 IMPLANT
GOWN STRL NON-REIN LRG LVL3 (GOWN DISPOSABLE) ×5 IMPLANT
GOWN STRL REIN XL XLG (GOWN DISPOSABLE) ×2 IMPLANT
GOWN STRL REUS W/ TWL LRG LVL3 (GOWN DISPOSABLE) IMPLANT
GOWN STRL REUS W/TWL LRG LVL3 (GOWN DISPOSABLE) ×2
HEMOSTAT SNOW SURGICEL 2X4 (HEMOSTASIS) IMPLANT
HEMOSTAT SURGICEL 2X4 FIBR (HEMOSTASIS) ×1 IMPLANT
KIT BASIN OR (CUSTOM PROCEDURE TRAY) ×2 IMPLANT
KIT ROOM TURNOVER OR (KITS) ×2 IMPLANT
NDL HYPO 25GX1X1/2 BEV (NEEDLE) ×1 IMPLANT
NEEDLE HYPO 25GX1X1/2 BEV (NEEDLE) ×2 IMPLANT
NS IRRIG 1000ML POUR BTL (IV SOLUTION) ×2 IMPLANT
PACK SURGICAL SETUP 50X90 (CUSTOM PROCEDURE TRAY) ×2 IMPLANT
PAD ARMBOARD 7.5X6 YLW CONV (MISCELLANEOUS) ×2 IMPLANT
PENCIL BUTTON HOLSTER BLD 10FT (ELECTRODE) ×4 IMPLANT
SPONGE GAUZE 2X2 STER 10/PKG (GAUZE/BANDAGES/DRESSINGS) ×1
SPONGE GAUZE 4X4 12PLY STER LF (GAUZE/BANDAGES/DRESSINGS) ×1 IMPLANT
SPONGE INTESTINAL PEANUT (DISPOSABLE) ×2 IMPLANT
STRIP CLOSURE SKIN 1/2X4 (GAUZE/BANDAGES/DRESSINGS) ×2 IMPLANT
SUT MNCRL AB 4-0 PS2 18 (SUTURE) ×2 IMPLANT
SUT SILK 3 0 (SUTURE)
SUT SILK 3 0 SH CR/8 (SUTURE) ×1 IMPLANT
SUT SILK 3-0 18XBRD TIE 12 (SUTURE) IMPLANT
SUT VIC AB 3-0 SH 27 (SUTURE) ×4
SUT VIC AB 3-0 SH 27XBRD (SUTURE) ×1 IMPLANT
SYR BULB 3OZ (MISCELLANEOUS) ×2 IMPLANT
SYR CONTROL 10ML LL (SYRINGE) ×2 IMPLANT
TAPE CLOTH SURG 4X10 WHT LF (GAUZE/BANDAGES/DRESSINGS) ×1 IMPLANT
TOWEL OR 17X24 6PK STRL BLUE (TOWEL DISPOSABLE) ×2 IMPLANT
TOWEL OR 17X26 10 PK STRL BLUE (TOWEL DISPOSABLE) ×2 IMPLANT
TOWEL OR NON WOVEN STRL DISP B (DISPOSABLE) ×1 IMPLANT
TUBE CONNECTING 12X1/4 (SUCTIONS) ×2 IMPLANT

## 2013-10-20 NOTE — Anesthesia Procedure Notes (Signed)
Procedure Name: Intubation Date/Time: 10/20/2013 10:56 AM Performed by: Jacob Moores Pre-anesthesia Checklist: Patient identified, Emergency Drugs available, Suction available and Patient being monitored Patient Re-evaluated:Patient Re-evaluated prior to inductionOxygen Delivery Method: Circle system utilized Preoxygenation: Pre-oxygenation with 100% oxygen Intubation Type: IV induction Ventilation: Mask ventilation without difficulty and Oral airway inserted - appropriate to patient size Laryngoscope Size: Sabra Heck and 2 Grade View: Grade I Tube type: Oral Tube size: 7.5 mm Number of attempts: 1 Airway Equipment and Method: Stylet and Oral airway Placement Confirmation: ETT inserted through vocal cords under direct vision,  positive ETCO2 and breath sounds checked- equal and bilateral Secured at: 23 cm Tube secured with: Tape Dental Injury: Teeth and Oropharynx as per pre-operative assessment

## 2013-10-20 NOTE — Op Note (Signed)
MINIMALLY INVASIVE PARATHYROIDECTOMY, POSSIBLE NECK EXPLORATION  Procedure Note  KAMIN WAYE 10/20/2013   Pre-op Diagnosis: parathyroid adenoma     Post-op Diagnosis: same  Procedure(s): MINIMALLY INVASIVE PARATHYROIDECTOMY CONVERTED TO COMPLETE NECK EXPLORATION  Surgeon(s): Harl Bowie, MD Earnstine Regal, MD  Anesthesia: General  Staff:  Circulator: Tomma Rakers Sipsis, RN; Levora Angel, RN Relief Circulator: Karmen Bongo, RN Relief Scrub: Leslie Andrea, CST Scrub Person: Epifanio Lesches Pingue, CST  Estimated Blood Loss: less than 100 mL               Specimens: SENT TO PATH          Fredick Schlosser A   Date: 10/20/2013  Time: 2:09 PM

## 2013-10-20 NOTE — Transfer of Care (Signed)
Immediate Anesthesia Transfer of Care Note  Patient: Phillip Blair  Procedure(s) Performed: Procedure(s): MINIMALLY INVASIVE PARATHYROIDECTOMY, POSSIBLE NECK EXPLORATION (N/A)  Patient Location: PACU  Anesthesia Type:General  Level of Consciousness: awake and alert   Airway & Oxygen Therapy: Patient Spontanous Breathing and Patient connected to nasal cannula oxygen  Post-op Assessment: Report given to PACU RN and Post -op Vital signs reviewed and stable  Post vital signs: Reviewed and stable  Complications: No apparent anesthesia complications

## 2013-10-20 NOTE — Interval H&P Note (Signed)
History and Physical Interval Note:  10/20/2013 10:46 AM  Phillip Blair  has presented today for surgery, with the diagnosis of parathyroid adenoma  The various methods of treatment have been discussed with the patient and family. After consideration of risks, benefits and other options for treatment, the patient has consented to  Procedure(s): MINIMALLY INVASIVE PARATHYROIDECTOMY, POSSIBLE NECK EXPLORATION (N/A) as a surgical intervention .  The patient's history has been reviewed, patient examined, no change in status, stable for surgery.  I have reviewed the patient's chart and labs.  Questions were answered to the patient's satisfaction.     Aeron Donaghey A

## 2013-10-20 NOTE — Preoperative (Signed)
Beta Blockers   Reason not to administer Beta Blockers:Not Applicable 

## 2013-10-20 NOTE — Interval H&P Note (Signed)
History and Physical Interval Note: no change in H and P  10/20/2013 9:48 AM  Phillip Blair  has presented today for surgery, with the diagnosis of parathyroid adenoma  The various methods of treatment have been discussed with the patient and family. After consideration of risks, benefits and other options for treatment, the patient has consented to  Procedure(s): MINIMALLY INVASIVE PARATHYROIDECTOMY, POSSIBLE NECK EXPLORATION (N/A) as a surgical intervention .  The patient's history has been reviewed, patient examined, no change in status, stable for surgery.  I have reviewed the patient's chart and labs.  Questions were answered to the patient's satisfaction.     Francoise Chojnowski A

## 2013-10-20 NOTE — Anesthesia Preprocedure Evaluation (Addendum)
Anesthesia Evaluation  Patient identified by MRN, date of birth, ID band Patient awake    Reviewed: Allergy & Precautions, H&P , NPO status , Patient's Chart, lab work & pertinent test results  Airway Mallampati: II  Neck ROM: full    Dental  (+) Dental Advisory Given and Teeth Intact   Pulmonary sleep apnea and Continuous Positive Airway Pressure Ventilation ,          Cardiovascular hypertension,     Neuro/Psych  Neuromuscular disease    GI/Hepatic GERD-  ,  Endo/Other    Renal/GU      Musculoskeletal   Abdominal   Peds  Hematology   Anesthesia Other Findings   Reproductive/Obstetrics                          Anesthesia Physical Anesthesia Plan  ASA: II  Anesthesia Plan: General   Post-op Pain Management:    Induction: Intravenous  Airway Management Planned: Oral ETT  Additional Equipment:   Intra-op Plan:   Post-operative Plan: Extubation in OR  Informed Consent: I have reviewed the patients History and Physical, chart, labs and discussed the procedure including the risks, benefits and alternatives for the proposed anesthesia with the patient or authorized representative who has indicated his/her understanding and acceptance.     Plan Discussed with: CRNA, Anesthesiologist and Surgeon  Anesthesia Plan Comments:         Anesthesia Quick Evaluation

## 2013-10-21 ENCOUNTER — Other Ambulatory Visit (INDEPENDENT_AMBULATORY_CARE_PROVIDER_SITE_OTHER): Payer: Self-pay | Admitting: Surgery

## 2013-10-21 DIAGNOSIS — E892 Postprocedural hypoparathyroidism: Secondary | ICD-10-CM

## 2013-10-21 DIAGNOSIS — Z9009 Acquired absence of other part of head and neck: Secondary | ICD-10-CM

## 2013-10-21 LAB — BASIC METABOLIC PANEL
BUN: 22 mg/dL (ref 6–23)
CALCIUM: 9.9 mg/dL (ref 8.4–10.5)
CO2: 27 meq/L (ref 19–32)
Chloride: 107 mEq/L (ref 96–112)
Creatinine, Ser: 1.6 mg/dL — ABNORMAL HIGH (ref 0.50–1.35)
GFR calc Af Amer: 54 mL/min — ABNORMAL LOW (ref >90)
GFR calc non Af Amer: 46 mL/min — ABNORMAL LOW (ref >90)
GLUCOSE: 105 mg/dL — AB (ref 70–99)
Potassium: 4.4 mEq/L (ref 3.7–5.3)
Sodium: 145 mEq/L (ref 137–147)

## 2013-10-21 LAB — CALCIUM, IONIZED
CALCIUM ION: 1.43 mmol/L — AB (ref 1.12–1.23)
Calcium, Ion: 1.42 mmol/L — ABNORMAL HIGH (ref 1.12–1.23)

## 2013-10-21 MED ORDER — OXYCODONE-ACETAMINOPHEN 5-325 MG PO TABS: 1.0000 | ORAL_TABLET | ORAL | Status: DC | PRN

## 2013-10-21 NOTE — Progress Notes (Addendum)
BiPAP pulled/cleaned, pt. being dc'd home, own mask taken home with pt.

## 2013-10-21 NOTE — Op Note (Signed)
NAME:  Phillip Blair NO.:  [id number]  MEDICAL RECORD NO.:  [id number]  LOCATION:  6N15C                        FACILITY:  [facility]  PHYSICIAN:  Coralie Keens, M.D. DATE OF BIRTH:  07-06-1956  DATE OF PROCEDURE:  10/20/2013 DATE OF DISCHARGE:                              OPERATIVE REPORT   PREOPERATIVE DIAGNOSIS:  Parathyroid adenoma.  POSTOPERATIVE DIAGNOSIS:  Parathyroid adenoma.  PROCEDURE:  Minimally invasive parathyroidectomy converted to open complete neck exploration.  SURGEON:  Kaniya Trueheart A. Ninfa Linden, M.D.  ASSISTANT:  Earnstine Regal, M.D.  ANESTHESIA:  General endotracheal anesthesia.  ESTIMATED BLOOD LOSS:  Minimal.  INDICATIONS:  Phillip Blair is a 58 year old gentleman who initially presented late last year with what appeared to be a parathyroid adenoma. He had elevated calcium and parathyroid hormone levels.  A sestamibi scan suggested adenoma inferior to the right thyroid gland.  He underwent a minimally invasive parathyroidectomy.  The adenoma was felt to have been removed and was felt to be consistent with the inferior parathyroid tissue was identified.  However, his calcium levels and parathyroid level stayed elevated.  A repeat sestamibi scan showed the suspicious area that is inferior to the right thyroid lobe was still present and unchanged.  Decision was made to proceed with a re-attempt of minimally invasive parathyroidectomy and possible neck exploration.  FINDINGS:  An adenoma could not be identified.  Biopsy was performed of the superior right parathyroid gland.  There was hypercellular cells there in the rest of the gland was removed and sent to the Pathology. The left superior parathyroid gland was identified, but not biopsied. The left inferior parathyroid gland was completely removed, and biopsy confirmed parathyroid tissue.  A large lymph node in the inferior neck on the right side was removed as well.  An extensive neck  exploration was undertaken and no other adenoma could be identified.  Decision was made to abort the procedure at this point.  PROCEDURE IN DETAIL:  The patient was brought to the operating room, identified as Phillip Blair.  He was placed supine on the operating table, and general anesthesia was induced.  His neck was then prepped and draped in usual sterile fashion.  I made an incision through the previous scar on the lower neck on the right side.  I took this down through the platysma with electrocautery.  The underlying muscle fibers were then identified and opened in the midline.  There was a moderate amount of scarring with the muscle to the thyroid gland.  I was able to separate this and identified the lower pole thyroid gland.  I then extensively explored the portion of his neck going down below the clavicle and sternal notch.  I could identify thymus tissue, but could not feel or identify any parathyroid adenoma.  At this point, I made the incision larger and decided to explore the entire neck.  We completely mobilized the right thyroid gland and looked along the carotid sheath and underneath the esophagus and exposed the spine and again found no evidence of adenoma.  The right superior parathyroid gland was identified and biopsied.  Frozen section showed this was slightly hypercellular.  It was thus completely removed.  The nodule on the  lower neck was also identified and sent to Pathology, and was found to be consistent with a lymph node.  We then identified parathyroid tissue on the left side of the thyroid gland.  The superior gland appeared normal, was left intact.  The inferior gland was completely removed and found to be consistent with parathyroid tissue as well.  Thymus tissue was also identified.  We removed several other pieces of tissue in the thymus area but again could not identify an adenoma despite complete exploration and identification of 3 of the glands  with the fourth being identified at the original surgery.  After extensive exploration was undertaken and discussion with my partner, decision was made to terminate the case.  I thoroughly irrigated the neck with saline.  I placed Fibrillar in the neck and hemostasis appeared to be achieved.  I closed the midline fascia with a running 3-0 Vicryl suture.  I then closed the platysma with interrupted 3-0 Vicryl sutures and closed the skin with running 4-0 Monocryl.  Steri-Strips, gauze, and tape were then applied.  The patient tolerated the procedure well.  All the counts were correct at the end of procedure.  The patient was then extubated in operating room and taken in stable condition to recovery room.     Coralie Keens, M.D.     DB/MEDQ  D:  10/20/2013  T:  10/21/2013  Job:  [id number]

## 2013-10-21 NOTE — Progress Notes (Signed)
Patient ID: Phillip Blair, male   DOB: [birth year], 58 y.o.   MRN: [id number]  Doing well Minimal hoarseness Calcium 9.9  Will discharge home

## 2013-10-21 NOTE — Discharge Instructions (Signed)
Ok to remove bandage and shower today  Leave steri-strips on for at least one week  Ice pack and ibuprofen also for pain  Call for any numbness or tingling around your mouth or fingers or cramping    YOU NEED TO BE OUT OF WORK FOR 2 WEEKS.  YOU MAY RETURN TO WORK ON 11/04/2013

## 2013-10-21 NOTE — Discharge Summary (Signed)
Physician Discharge Summary  Patient ID: Phillip Blair MRN: [id number] DOB/AGE: 1956-07-17 58 y.o.  Admit date: 10/20/2013 Discharge date: 10/21/2013  Admission Diagnoses:  Discharge Diagnoses:  Active Problems:   Parathyroid adenoma   Discharged Condition: good  Hospital Course: admitted to observation s/p surgery.  Did well.  Calcium decreased post op.  Discharged home pod#1  Consults: None  Significant Diagnostic Studies:   Treatments: surgery: neck exploration with parathyroidectomy  Discharge Exam: Blood pressure 117/70, pulse 54, temperature 97.6 F (36.4 C), temperature source Oral, resp. rate 16, height 6' (1.829 m), weight 192 lb 4.8 oz (87.227 kg), SpO2 100.00%. Incision/Wound:incision clean, voice near normal, no neck hematoma  Disposition: 01-Home or Self Care   Future Appointments Provider Department Dept Phone   01/01/2014 9:20 AM [name], MD [place] [phone]       Medication List         allopurinol 300 MG tablet  Commonly known as:  ZYLOPRIM  Take 300 mg by mouth daily.     amLODipine 10 MG tablet  Commonly known as:  NORVASC  Take 10 mg by mouth daily.     COLD MILLED GOLDEN FLAX SEED Powd  Take 15 mLs by mouth daily. Note 1 tablespoon/ day     fish oil-omega-3 fatty acids 1000 MG capsule  Take 1 g by mouth daily.     ibuprofen 200 MG tablet  Commonly known as:  ADVIL,MOTRIN  Take 200 mg by mouth every 6 (six) hours as needed for headache.     losartan 100 MG tablet  Commonly known as:  COZAAR  Take 100 mg by mouth daily.     oxyCODONE-acetaminophen 5-325 MG per tablet  Commonly known as:  PERCOCET/ROXICET  Take 1-2 tablets by mouth every 4 (four) hours as needed for moderate pain.           Follow-up Information   Follow up with [facility], MD. Call in 3 weeks. (ask for [facility])    Specialty:  General Surgery   Contact information:   [address] [place]  [address] [phone]       Signed: Harl Bowie 10/21/2013, 7:45 AM

## 2013-10-21 NOTE — Anesthesia Postprocedure Evaluation (Signed)
  Anesthesia Post-op Note  Patient: Phillip Blair  Procedure(s) Performed: Procedure(s): MINIMALLY INVASIVE PARATHYROIDECTOMY CONVERTED TO COMPLETE NECK EXPLORATION (N/A)  Patient Location: PACU  Anesthesia Type:General  Level of Consciousness: awake  Airway and Oxygen Therapy: Patient Spontanous Breathing  Post-op Pain: mild  Post-op Assessment: Post-op Vital signs reviewed  Post-op Vital Signs: Reviewed  Complications: No apparent anesthesia complications

## 2013-10-21 NOTE — Progress Notes (Signed)
Discharge instructions were given and went over with patient. He was given prescription for percocet to take to his pharmacy. Patient was also told about scheduling next appointment with Dr. Ninfa Linden. Patient stated that he did not have any questions. Volunteer called and assisted patient and his wife to their transportation.

## 2013-10-22 ENCOUNTER — Encounter (HOSPITAL_COMMUNITY): Payer: Self-pay | Admitting: Surgery

## 2013-11-05 LAB — CALCIUM: CALCIUM: 10.5 mg/dL (ref 8.4–10.5)

## 2013-11-09 ENCOUNTER — Ambulatory Visit (INDEPENDENT_AMBULATORY_CARE_PROVIDER_SITE_OTHER): Payer: PRIVATE HEALTH INSURANCE | Admitting: Surgery

## 2013-11-09 ENCOUNTER — Other Ambulatory Visit (INDEPENDENT_AMBULATORY_CARE_PROVIDER_SITE_OTHER): Payer: Self-pay | Admitting: Surgery

## 2013-11-09 ENCOUNTER — Encounter (INDEPENDENT_AMBULATORY_CARE_PROVIDER_SITE_OTHER): Payer: Self-pay | Admitting: Surgery

## 2013-11-09 DIAGNOSIS — Z09 Encounter for follow-up examination after completed treatment for conditions other than malignant neoplasm: Secondary | ICD-10-CM

## 2013-11-09 DIAGNOSIS — Z8639 Personal history of other endocrine, nutritional and metabolic disease: Secondary | ICD-10-CM

## 2013-11-09 NOTE — Progress Notes (Signed)
Subjective:     Patient ID: Phillip Blair, male   DOB: [birth year], 58 y.o.   MRN: [id number]  HPI He is here for his postop visit. Other than mild incisional discomfort he is doing well  Review of Systems     Objective:   Physical Exam On exam, his incision is healing well. Again at the time of surgery we cannot localize the parathyroid adenoma. His calcium is currently 10.5    Assessment:     Patient stable postop     Plan:     We will check a parathyroid hormone level and calcium level proximal to 4 weeks and then figure out proceed from there with further workup should it be determined that the adenoma may still be present

## 2013-12-09 LAB — CALCIUM: Calcium: 11.1 mg/dL — ABNORMAL HIGH (ref 8.4–10.5)

## 2013-12-10 LAB — PTH, INTACT AND CALCIUM
CALCIUM: 10.7 mg/dL — AB (ref 8.4–10.5)
PTH: 246.3 pg/mL — AB (ref 14.0–72.0)

## 2013-12-18 ENCOUNTER — Other Ambulatory Visit (INDEPENDENT_AMBULATORY_CARE_PROVIDER_SITE_OTHER): Payer: Self-pay | Admitting: Surgery

## 2013-12-18 ENCOUNTER — Telehealth (INDEPENDENT_AMBULATORY_CARE_PROVIDER_SITE_OTHER): Payer: Self-pay | Admitting: General Surgery

## 2013-12-18 DIAGNOSIS — Z8639 Personal history of other endocrine, nutritional and metabolic disease: Secondary | ICD-10-CM

## 2013-12-18 NOTE — Telephone Encounter (Signed)
Called patient to let him know that Dr Ninfa Linden wanted him to have 24 urine calcium and a Vit D level done at the [facility] [place] and the patient stated that he will go this coming Wednesday to have his lab work done and I told him that I will mail the order to him and I also told him that I will let Dr Ninfa Linden aware

## 2013-12-24 ENCOUNTER — Other Ambulatory Visit (INDEPENDENT_AMBULATORY_CARE_PROVIDER_SITE_OTHER): Payer: Self-pay | Admitting: Surgery

## 2013-12-24 LAB — VITAMIN D 25 HYDROXY (VIT D DEFICIENCY, FRACTURES): VIT D 25 HYDROXY: 28 ng/mL — AB (ref 30–89)

## 2013-12-25 LAB — CALCIUM, URINE, 24 HOUR
CALCIUM 24HR UR: 110 mg/d (ref 100–250)
Calcium, Ur: 5 mg/dL

## 2013-12-31 ENCOUNTER — Ambulatory Visit: Payer: PRIVATE HEALTH INSURANCE | Admitting: Family Medicine

## 2014-01-01 ENCOUNTER — Ambulatory Visit (INDEPENDENT_AMBULATORY_CARE_PROVIDER_SITE_OTHER): Payer: PRIVATE HEALTH INSURANCE | Admitting: Family Medicine

## 2014-01-01 ENCOUNTER — Encounter: Payer: Self-pay | Admitting: Family Medicine

## 2014-01-01 VITALS — BP 129/83 | HR 44 | Temp 98.3°F | Ht 71.0 in | Wt 194.4 lb

## 2014-01-01 DIAGNOSIS — N189 Chronic kidney disease, unspecified: Secondary | ICD-10-CM

## 2014-01-01 DIAGNOSIS — E785 Hyperlipidemia, unspecified: Secondary | ICD-10-CM

## 2014-01-01 DIAGNOSIS — R7309 Other abnormal glucose: Secondary | ICD-10-CM

## 2014-01-01 DIAGNOSIS — D351 Benign neoplasm of parathyroid gland: Secondary | ICD-10-CM

## 2014-01-01 DIAGNOSIS — R739 Hyperglycemia, unspecified: Secondary | ICD-10-CM

## 2014-01-01 DIAGNOSIS — K219 Gastro-esophageal reflux disease without esophagitis: Secondary | ICD-10-CM

## 2014-01-01 DIAGNOSIS — G4733 Obstructive sleep apnea (adult) (pediatric): Secondary | ICD-10-CM

## 2014-01-01 DIAGNOSIS — I10 Essential (primary) hypertension: Secondary | ICD-10-CM

## 2014-01-01 DIAGNOSIS — M109 Gout, unspecified: Secondary | ICD-10-CM

## 2014-01-01 LAB — POCT GLYCOSYLATED HEMOGLOBIN (HGB A1C): Hemoglobin A1C: 6.1

## 2014-01-01 NOTE — Progress Notes (Signed)
Patient ID: Phillip Blair, male   DOB: [birth year], 58 y.o.   MRN: [id number] SUBJECTIVE: CC: Chief Complaint  Patient presents with  . Follow-up    4 month follow up chronic problems   refill meds     HPI: Patient is here for follow up of hyperlipidemia/HTN/hyperglycemia/gout : denies Headache;denies Chest Pain;denies weakness;denies Shortness of Breath and orthopnea;denies Visual changes;denies palpitations;denies cough;denies pedal edema;denies symptoms of TIA or stroke;deniesClaudication symptoms. admits to Compliance with medications; denies Problems with medications.  Hyperparathyroid : had parathyroidectomy x2: labs post op was excellent and now the calcium is going back up. Has had 24 hour urine calcium levels done. He is awaiting a call back from Dr Ninfa Linden on the results and the next steps.  Past Medical History  Diagnosis Date  . Allergy   . Hyperlipidemia   . Gout   . Sleep apnea     uses CPAP  . Hypertension     does not see a cardiologist  . Parathyroid adenoma   . Hypercalcemia   . GERD (gastroesophageal reflux disease)     occ  . Chronic kidney disease     denies   Past Surgical History  Procedure Laterality Date  . Vasectomy    . Nasal septoplasty w/ turbinoplasty    . Minimally invasive radioactive parathyroidectomy N/A 05/04/2013    Procedure: PARATHYROIDECTOMY MINIMALLY INVASIVE;  Surgeon: Harl Bowie, MD;  Location: [employer];  Service: General;  Laterality: N/A;  . Minimally invasive radioactive parathyroidectomy N/A 10/20/2013    Procedure: MINIMALLY INVASIVE PARATHYROIDECTOMY CONVERTED TO COMPLETE NECK EXPLORATION;  Surgeon: Harl Bowie, MD;  Location: [facility];  Service: General;  Laterality: N/A;   History   Social History  . Marital Status: Married    Spouse Name: N/A    Number of Children: N/A  . Years of Education: N/A   Occupational History  . Not on file.   Social History Main Topics  . Smoking status: Never Smoker   .  Smokeless tobacco: Never Used  . Alcohol Use: No  . Drug Use: No  . Sexual Activity: Not on file   Other Topics Concern  . Not on file   Social History Narrative  . No narrative on file   No family history on file. Current Outpatient Prescriptions on File Prior to Visit  Medication Sig Dispense Refill  . allopurinol (ZYLOPRIM) 300 MG tablet Take 300 mg by mouth daily.      Marland Kitchen amLODipine (NORVASC) 10 MG tablet Take 10 mg by mouth daily.      . fish oil-omega-3 fatty acids 1000 MG capsule Take 1 g by mouth daily.       . Flaxseed, Linseed, (COLD MILLED GOLDEN FLAX SEED) POWD Take 15 mLs by mouth daily. Note 1 tablespoon/ day      . ibuprofen (ADVIL,MOTRIN) 200 MG tablet Take 200 mg by mouth every 6 (six) hours as needed for headache.      . losartan (COZAAR) 100 MG tablet Take 100 mg by mouth daily.       No current facility-administered medications on file prior to visit.   No Known Allergies Immunization History  Administered Date(s) Administered  . Influenza Split 07/09/2011, 08/11/2012  . Influenza Whole 07/26/2010  . Influenza-Unspecified 08/28/2013  . Tdap 08/10/2011   Prior to Admission medications   Medication Sig Start Date End Date Taking? Authorizing Provider  allopurinol (ZYLOPRIM) 300 MG tablet Take 300 mg by mouth daily.   Yes Historical Provider,  MD  amLODipine (NORVASC) 10 MG tablet Take 10 mg by mouth daily.   Yes Historical Provider, MD  fish oil-omega-3 fatty acids 1000 MG capsule Take 1 g by mouth daily.    Yes Historical Provider, MD  Flaxseed, Linseed, (COLD MILLED GOLDEN FLAX SEED) POWD Take 15 mLs by mouth daily. Note 1 tablespoon/ day   Yes Historical Provider, MD  ibuprofen (ADVIL,MOTRIN) 200 MG tablet Take 200 mg by mouth every 6 (six) hours as needed for headache.   Yes Historical Provider, MD  losartan (COZAAR) 100 MG tablet Take 100 mg by mouth daily.   Yes Historical Provider, MD     ROS: As above in the HPI. All other systems are stable or  negative.  OBJECTIVE: APPEARANCE:  Patient in no acute distress.The patient appeared well nourished and normally developed. Acyanotic. Waist: VITAL SIGNS:BP 129/83  Pulse 44  Temp(Src) 98.3 F (36.8 C) (Oral)  Ht 5' 11"  (1.803 m)  Wt 194 lb 6.4 oz (88.179 kg)  BMI 27.13 kg/m2 Overweight [place] Male  SKIN: warm and  Dry without overt rashes, tattoos  HEAD and Neck: without JVD, Head and scalp: normal Eyes:No scleral icterus. Fundi normal, eye movements normal. Ears: Auricle normal, canal normal, Tympanic membranes normal, insufflation normal. Nose: normal Throat: normal Neck & thyroid: surgical scar is healed.  CHEST & LUNGS: Chest wall: normal Lungs: Clear  CVS: Reveals the PMI to be normally located. Regular rhythm, First and Second Heart sounds are normal,  absence of murmurs, rubs or gallops. Peripheral vasculature: Radial pulses: normal Dorsal pedis pulses: normal Posterior pulses: normal  ABDOMEN:  Appearance: overweight Benign, no organomegaly, no masses, no Abdominal Aortic enlargement. No Guarding , no rebound. No Bruits. Bowel sounds: normal  RECTAL: N/A GU: N/A  EXTREMETIES: nonedematous.  MUSCULOSKELETAL:  Spine: normal Joints: intact  NEUROLOGIC: oriented to time,place and person; nonfocal. Strength is normal Sensory is normal Reflexes are normal Cranial Nerves are normal.  ASSESSMENT:  SLEEP APNEA, OBSTRUCTIVE, SEVERE  Parathyroid adenoma  HYPERTENSION - Plan: CMP14+EGFR  Hyperglycemia - Plan: CMP14+EGFR, POCT glycosylated hemoglobin (Hb A1C)  HLD (hyperlipidemia) - Plan: Lipid panel  GOUT - Plan: Uric acid  Chronic kidney disease  GERD  PLAN: Await his call back whether he wants to see Dr Chalmers Cater the endocrinologist.   Orders Placed This Encounter  Procedures  . Uric acid  . CMP14+EGFR  . Lipid panel  . POCT glycosylated hemoglobin (Hb A1C)   No orders of the defined types were placed in this encounter.   There are  no discontinued medications. Return in about 3 months (around 04/02/2014) for Recheck medical problems.  Tresten Pantoja P. Jacelyn Grip, M.D.

## 2014-01-02 LAB — CMP14+EGFR
ALT: 19 IU/L (ref 0–44)
AST: 17 IU/L (ref 0–40)
Albumin/Globulin Ratio: 2 (ref 1.1–2.5)
Albumin: 4.3 g/dL (ref 3.5–5.5)
Alkaline Phosphatase: 104 IU/L (ref 39–117)
BUN/Creatinine Ratio: 14 (ref 9–20)
BUN: 25 mg/dL — ABNORMAL HIGH (ref 6–24)
CO2: 23 mmol/L (ref 18–29)
Calcium: 10.6 mg/dL — ABNORMAL HIGH (ref 8.7–10.2)
Chloride: 103 mmol/L (ref 97–108)
Creatinine, Ser: 1.75 mg/dL — ABNORMAL HIGH (ref 0.76–1.27)
GFR calc Af Amer: 49 mL/min/{1.73_m2} — ABNORMAL LOW (ref >59)
GFR calc non Af Amer: 42 mL/min/{1.73_m2} — ABNORMAL LOW (ref >59)
Globulin, Total: 2.2 g/dL (ref 1.5–4.5)
Glucose: 90 mg/dL (ref 65–99)
Potassium: 4.3 mmol/L (ref 3.5–5.2)
Sodium: 141 mmol/L (ref 134–144)
Total Bilirubin: 0.7 mg/dL (ref 0.0–1.2)
Total Protein: 6.5 g/dL (ref 6.0–8.5)

## 2014-01-02 LAB — LIPID PANEL
Chol/HDL Ratio: 3.8 ratio units (ref 0.0–5.0)
Cholesterol, Total: 216 mg/dL — ABNORMAL HIGH (ref 100–199)
HDL: 57 mg/dL (ref >39)
LDL Calculated: 140 mg/dL — ABNORMAL HIGH (ref 0–99)
Triglycerides: 94 mg/dL (ref 0–149)
VLDL Cholesterol Cal: 19 mg/dL (ref 5–40)

## 2014-01-02 LAB — URIC ACID: Uric Acid: 4.7 mg/dL (ref 3.7–8.6)

## 2014-01-03 ENCOUNTER — Other Ambulatory Visit: Payer: Self-pay | Admitting: Family Medicine

## 2014-01-03 DIAGNOSIS — E785 Hyperlipidemia, unspecified: Secondary | ICD-10-CM

## 2014-01-03 MED ORDER — PRAVASTATIN SODIUM 20 MG PO TABS: 20.0000 mg | ORAL_TABLET | Freq: Every day | ORAL | Status: DC

## 2014-01-03 NOTE — Progress Notes (Signed)
Quick Note:  Call Patient Labs that are abnormal: Prediabetic with the HGBA1C at 6.1 Uric acid level is great no change in the allopurinol dose. Creatinine / kidney function stable Lipids better but not at goal The calcium is still elevated but less than the last time with Dr Denita Lung.  The rest are at goal  Recommendations: Will need to start on a statin. Ordered in EPIC   ______

## 2014-01-04 ENCOUNTER — Other Ambulatory Visit (INDEPENDENT_AMBULATORY_CARE_PROVIDER_SITE_OTHER): Payer: Self-pay | Admitting: Surgery

## 2014-01-04 DIAGNOSIS — E079 Disorder of thyroid, unspecified: Secondary | ICD-10-CM

## 2014-01-21 ENCOUNTER — Encounter (HOSPITAL_COMMUNITY)
Admission: RE | Admit: 2014-01-21 | Discharge: 2014-01-21 | Disposition: A | Discharge status: Home / Self Care | Payer: PRIVATE HEALTH INSURANCE | Source: Ambulatory Visit | Attending: Surgery | Admitting: Surgery

## 2014-01-21 DIAGNOSIS — E079 Disorder of thyroid, unspecified: Secondary | ICD-10-CM | POA: Insufficient documentation

## 2014-01-21 DIAGNOSIS — Z9089 Acquired absence of other organs: Secondary | ICD-10-CM | POA: Insufficient documentation

## 2014-01-21 MED ORDER — TECHNETIUM TC 99M SESTAMIBI GENERIC - CARDIOLITE
26.1000 | Freq: Once | INTRAVENOUS | Status: AC | PRN
  Administered 2014-01-21: 26.1 via INTRAVENOUS

## 2014-01-25 ENCOUNTER — Other Ambulatory Visit (INDEPENDENT_AMBULATORY_CARE_PROVIDER_SITE_OTHER): Payer: Self-pay | Admitting: Surgery

## 2014-01-25 DIAGNOSIS — D351 Benign neoplasm of parathyroid gland: Secondary | ICD-10-CM

## 2014-01-29 ENCOUNTER — Ambulatory Visit
Admission: RE | Admit: 2014-01-29 | Discharge: 2014-01-29 | Disposition: A | Discharge status: Home / Self Care | Payer: PRIVATE HEALTH INSURANCE | Source: Ambulatory Visit | Attending: Surgery | Admitting: Surgery

## 2014-01-29 DIAGNOSIS — D351 Benign neoplasm of parathyroid gland: Secondary | ICD-10-CM

## 2014-01-29 MED ORDER — IOHEXOL 300 MG/ML  SOLN
75.0000 mL | Freq: Once | INTRAMUSCULAR | Status: AC | PRN
  Administered 2014-01-29: 75 mL via INTRAVENOUS

## 2014-10-18 ENCOUNTER — Other Ambulatory Visit: Payer: Self-pay | Admitting: Family Medicine

## 2014-10-18 DIAGNOSIS — R1011 Right upper quadrant pain: Secondary | ICD-10-CM

## 2014-10-19 ENCOUNTER — Ambulatory Visit
Admission: RE | Admit: 2014-10-19 | Discharge: 2014-10-19 | Disposition: A | Discharge status: Home / Self Care | Payer: 59 | Source: Ambulatory Visit | Attending: Family Medicine | Admitting: Family Medicine

## 2014-10-19 DIAGNOSIS — R1011 Right upper quadrant pain: Secondary | ICD-10-CM

## 2014-10-21 ENCOUNTER — Other Ambulatory Visit: Payer: Self-pay | Admitting: Family Medicine

## 2014-10-21 DIAGNOSIS — N2889 Other specified disorders of kidney and ureter: Secondary | ICD-10-CM

## 2014-10-26 ENCOUNTER — Ambulatory Visit
Admission: RE | Admit: 2014-10-26 | Discharge: 2014-10-26 | Disposition: A | Discharge status: Home / Self Care | Payer: 59 | Source: Ambulatory Visit | Attending: Family Medicine | Admitting: Family Medicine

## 2014-10-26 DIAGNOSIS — N2889 Other specified disorders of kidney and ureter: Secondary | ICD-10-CM

## 2014-10-26 MED ORDER — IOHEXOL 300 MG/ML  SOLN
80.0000 mL | Freq: Once | INTRAMUSCULAR | Status: AC | PRN
  Administered 2014-10-26: 80 mL via INTRAVENOUS

## 2014-11-02 ENCOUNTER — Other Ambulatory Visit (HOSPITAL_COMMUNITY): Payer: Self-pay | Admitting: Urology

## 2014-11-02 DIAGNOSIS — D49519 Neoplasm of unspecified behavior of unspecified kidney: Secondary | ICD-10-CM

## 2014-11-15 ENCOUNTER — Ambulatory Visit (HOSPITAL_COMMUNITY)
Admission: RE | Admit: 2014-11-15 | Discharge: 2014-11-15 | Disposition: A | Discharge status: Home / Self Care | Payer: Commercial Managed Care - PPO | Source: Ambulatory Visit | Attending: Urology | Admitting: Urology

## 2014-11-15 DIAGNOSIS — N2889 Other specified disorders of kidney and ureter: Secondary | ICD-10-CM | POA: Insufficient documentation

## 2014-11-15 DIAGNOSIS — Q612 Polycystic kidney, adult type: Secondary | ICD-10-CM | POA: Insufficient documentation

## 2014-11-15 DIAGNOSIS — R934 Abnormal findings on diagnostic imaging of urinary organs: Secondary | ICD-10-CM | POA: Diagnosis not present

## 2014-11-15 DIAGNOSIS — D49519 Neoplasm of unspecified behavior of unspecified kidney: Secondary | ICD-10-CM

## 2014-11-15 MED ORDER — GADOBENATE DIMEGLUMINE 529 MG/ML IV SOLN
20.0000 mL | Freq: Once | INTRAVENOUS | Status: AC | PRN
  Administered 2014-11-15: 18 mL via INTRAVENOUS

## 2014-12-01 ENCOUNTER — Other Ambulatory Visit: Payer: Self-pay | Admitting: Urology

## 2015-01-11 ENCOUNTER — Encounter (HOSPITAL_COMMUNITY)
Admission: RE | Admit: 2015-01-11 | Discharge: 2015-01-11 | Disposition: A | Discharge status: Home / Self Care | Payer: Commercial Managed Care - PPO | Source: Ambulatory Visit | Attending: Urology | Admitting: Urology

## 2015-01-11 ENCOUNTER — Other Ambulatory Visit (HOSPITAL_COMMUNITY): Payer: Self-pay | Admitting: *Deleted

## 2015-01-11 ENCOUNTER — Encounter (HOSPITAL_COMMUNITY): Payer: Self-pay

## 2015-01-11 DIAGNOSIS — Z01818 Encounter for other preprocedural examination: Secondary | ICD-10-CM | POA: Insufficient documentation

## 2015-01-11 LAB — CBC
HEMATOCRIT: 42.6 % (ref 39.0–52.0)
Hemoglobin: 13.6 g/dL (ref 13.0–17.0)
MCH: 28.7 pg (ref 26.0–34.0)
MCHC: 31.9 g/dL (ref 30.0–36.0)
MCV: 89.9 fL (ref 78.0–100.0)
Platelets: 220 10*3/uL (ref 150–400)
RBC: 4.74 MIL/uL (ref 4.22–5.81)
RDW: 13.8 % (ref 11.5–15.5)
WBC: 6.2 10*3/uL (ref 4.0–10.5)

## 2015-01-11 LAB — BASIC METABOLIC PANEL
ANION GAP: 7 (ref 5–15)
BUN: 32 mg/dL — ABNORMAL HIGH (ref 6–23)
CALCIUM: 10.8 mg/dL — AB (ref 8.4–10.5)
CHLORIDE: 111 mmol/L (ref 96–112)
CO2: 23 mmol/L (ref 19–32)
Creatinine, Ser: 1.91 mg/dL — ABNORMAL HIGH (ref 0.50–1.35)
GFR calc Af Amer: 43 mL/min — ABNORMAL LOW (ref >90)
GFR calc non Af Amer: 37 mL/min — ABNORMAL LOW (ref >90)
GLUCOSE: 91 mg/dL (ref 70–99)
Potassium: 4.3 mmol/L (ref 3.5–5.1)
Sodium: 141 mmol/L (ref 135–145)

## 2015-01-11 NOTE — Patient Instructions (Signed)
Phillip Blair  01/11/2015   Your procedure is scheduled on: Wednesday 01/19/2015  Report to [facility] Main  Entrance and follow signs to               [place] [facility].  Call this number if you have problems the morning of surgery [phone]   Remember:  Anna DR. MANNY'S OFFICE AND A CLEAR LIQUID DIET THE DAY OF BOWEL PREP!   Do not eat food or drink liquids :After Midnight.     Take these medicines the morning of surgery with A SIP OF WATER: Amlodipine (norvasc)                               You may not have any metal on your body including hair pins and              piercings  Do not wear jewelry, make-up, lotions, powders or perfumes.             Do not wear nail polish.  Do not shave  48 hours prior to surgery.              Men may shave face and neck.   Do not bring valuables to the hospital. [name].  Contacts, dentures or bridgework may not be worn into surgery.  Leave suitcase in the car. After surgery it may be brought to your room.     Patients discharged the day of surgery will not be allowed to drive home.  Name and phone number of your driver:  Special Instructions: N/A              Please read over the following fact sheets you were given: _____________________________________________________________________             [facility] - Preparing for Surgery Before surgery, you can play an important role.  Because skin is not sterile, your skin needs to be as free of germs as possible.  You can reduce the number of germs on your skin by washing with CHG (chlorahexidine gluconate) soap before surgery.  CHG is an antiseptic cleaner which kills germs and bonds with the skin to continue killing germs even after washing. Please DO NOT use if you have an allergy to CHG or antibacterial soaps.  If your skin becomes reddened/irritated stop using the  CHG and inform your nurse when you arrive at Short Stay. Do not shave (including legs and underarms) for at least 48 hours prior to the first CHG shower.  You may shave your face/neck. Please follow these instructions carefully:  1.  Shower with CHG Soap the night before surgery and the  morning of Surgery.  2.  If you choose to wash your hair, wash your hair first as usual with your  normal  shampoo.  3.  After you shampoo, rinse your hair and body thoroughly to remove the  shampoo.                           4.  Use CHG as you would any other liquid soap.  You can apply chg directly  to the skin and wash  Gently with a scrungie or clean washcloth.  5.  Apply the CHG Soap to your body ONLY FROM THE NECK DOWN.   Do not use on face/ open                           Wound or open sores. Avoid contact with eyes, ears mouth and genitals (private parts).                       Wash face,  Genitals (private parts) with your normal soap.             6.  Wash thoroughly, paying special attention to the area where your surgery  will be performed.  7.  Thoroughly rinse your body with warm water from the neck down.  8.  DO NOT shower/wash with your normal soap after using and rinsing off  the CHG Soap.                9.  Pat yourself dry with a clean towel.            10.  Wear clean pajamas.            11.  Place clean sheets on your bed the night of your first shower and do not  sleep with pets. Day of Surgery : Do not apply any lotions/deodorants the morning of surgery.  Please wear clean clothes to the hospital/surgery center.  FAILURE TO FOLLOW THESE INSTRUCTIONS MAY RESULT IN THE CANCELLATION OF YOUR SURGERY PATIENT SIGNATURE_________________________________  NURSE SIGNATURE__________________________________  ________________________________________________________________________   Phillip Blair  An incentive spirometer is a tool that can help keep your lungs clear  and active. This tool measures how well you are filling your lungs with each breath. Taking long deep breaths may help reverse or decrease the chance of developing breathing (pulmonary) problems (especially infection) following:  A long period of time when you are unable to move or be active. BEFORE THE PROCEDURE   If the spirometer includes an indicator to show your best effort, your nurse or respiratory therapist will set it to a desired goal.  If possible, sit up straight or lean slightly forward. Try not to slouch.  Hold the incentive spirometer in an upright position. INSTRUCTIONS FOR USE   Sit on the edge of your bed if possible, or sit up as far as you can in bed or on a chair.  Hold the incentive spirometer in an upright position.  Breathe out normally.  Place the mouthpiece in your mouth and seal your lips tightly around it.  Breathe in slowly and as deeply as possible, raising the piston or the ball toward the top of the column.  Hold your breath for 3-5 seconds or for as long as possible. Allow the piston or ball to fall to the bottom of the column.  Remove the mouthpiece from your mouth and breathe out normally.  Rest for a few seconds and repeat Steps 1 through 7 at least 10 times every 1-2 hours when you are awake. Take your time and take a few normal breaths between deep breaths.  The spirometer may include an indicator to show your best effort. Use the indicator as a goal to work toward during each repetition.  After each set of 10 deep breaths, practice coughing to be sure your lungs are clear. If you have an incision (the cut made at the time of surgery),  support your incision when coughing by placing a pillow or rolled up towels firmly against it. Once you are able to get out of bed, walk around indoors and cough well. You may stop using the incentive spirometer when instructed by your caregiver.  RISKS AND COMPLICATIONS  Take your time so you do not get dizzy or  light-headed.  If you are in pain, you may need to take or ask for pain medication before doing incentive spirometry. It is harder to take a deep breath if you are having pain. AFTER USE  Rest and breathe slowly and easily.  It can be helpful to keep track of a log of your progress. Your caregiver can provide you with a simple table to help with this. If you are using the spirometer at home, follow these instructions: [place] IF:   You are having difficultly using the spirometer.  You have trouble using the spirometer as often as instructed.  Your pain medication is not giving enough relief while using the spirometer.  You develop fever of 100.5 F (38.1 C) or higher. SEEK IMMEDIATE MEDICAL CARE IF:   You cough up bloody sputum that had not been present before.  You develop fever of 102 F (38.9 C) or greater.  You develop worsening pain at or near the incision site. MAKE SURE YOU:   Understand these instructions.  Will watch your condition.  Will get help right away if you are not doing well or get worse. Document Released: 01/21/2007 Document Revised: 12/03/2011 Document Reviewed: 03/24/2007 ExitCare Patient Information 2014 ExitCare, [place].   ________________________________________________________________________  WHAT IS A BLOOD TRANSFUSION? Blood Transfusion Information  A transfusion is the replacement of blood or some of its parts. Blood is made up of multiple cells which provide different functions.  Red blood cells carry oxygen and are used for blood loss replacement.  White blood cells fight against infection.  Platelets control bleeding.  Plasma helps clot blood.  Other blood products are available for specialized needs, such as hemophilia or other clotting disorders. BEFORE THE TRANSFUSION  Who gives blood for transfusions?   Healthy volunteers who are fully evaluated to make sure their blood is safe. This is blood bank  blood. Transfusion therapy is the safest it has ever been in the practice of medicine. Before blood is taken from a donor, a complete history is taken to make sure that person has no history of diseases nor engages in risky social behavior (examples are intravenous drug use or sexual activity with multiple partners). The donor's travel history is screened to minimize risk of transmitting infections, such as malaria. The donated blood is tested for signs of infectious diseases, such as HIV and hepatitis. The blood is then tested to be sure it is compatible with you in order to minimize the chance of a transfusion reaction. If you or a relative donates blood, this is often done in anticipation of surgery and is not appropriate for emergency situations. It takes many days to process the donated blood. RISKS AND COMPLICATIONS Although transfusion therapy is very safe and saves many lives, the main dangers of transfusion include:   Getting an infectious disease.  Developing a transfusion reaction. This is an allergic reaction to something in the blood you were given. Every precaution is taken to prevent this. The decision to have a blood transfusion has been considered carefully by your caregiver before blood is given. Blood is not given unless the benefits outweigh the risks. AFTER THE TRANSFUSION  Right after receiving a blood transfusion, you will usually feel much better and more energetic. This is especially true if your red blood cells have gotten low (anemic). The transfusion raises the level of the red blood cells which carry oxygen, and this usually causes an energy increase.  The nurse administering the transfusion will monitor you carefully for complications. HOME CARE INSTRUCTIONS  No special instructions are needed after a transfusion. You may find your energy is better. Speak with your caregiver about any limitations on activity for underlying diseases you may have. SEEK MEDICAL CARE IF:    Your condition is not improving after your transfusion.  You develop redness or irritation at the intravenous (IV) site. SEEK IMMEDIATE MEDICAL CARE IF:  Any of the following symptoms occur over the next 12 hours:  Shaking chills.  You have a temperature by mouth above 102 F (38.9 C), not controlled by medicine.  Chest, back, or muscle pain.  People around you feel you are not acting correctly or are confused.  Shortness of breath or difficulty breathing.  Dizziness and fainting.  You get a rash or develop hives.  You have a decrease in urine output.  Your urine turns a dark color or changes to pink, red, or brown. Any of the following symptoms occur over the next 10 days:  You have a temperature by mouth above 102 F (38.9 C), not controlled by medicine.  Shortness of breath.  Weakness after normal activity.  The white part of the eye turns yellow (jaundice).  You have a decrease in the amount of urine or are urinating less often.  Your urine turns a dark color or changes to pink, red, or brown. Document Released: 09/07/2000 Document Revised: 12/03/2011 Document Reviewed: 04/26/2008 ExitCare Patient Information 2014 ExitCare, [place].  _______________________________________________________________________   CLEAR LIQUID DIET   Foods Allowed                                                                     Foods Excluded  Coffee and tea, regular and decaf                             liquids that you cannot  Plain Jell-O in any flavor                                             see through such as: Fruit ices (not with fruit pulp)                                     milk, soups, orange juice  Iced Popsicles                                    All solid food Carbonated beverages, regular and diet  Cranberry, grape and apple juices Sports drinks like Gatorade Lightly seasoned clear broth or consume(fat free) Sugar, honey  syrup  Sample Menu Breakfast                                Lunch                                     Supper Cranberry juice                    Beef broth                            Chicken broth Jell-O                                     Grape juice                           Apple juice Coffee or tea                        Jell-O                                      Popsicle                                                Coffee or tea                        Coffee or tea  _____________________________________________________________________

## 2015-01-19 ENCOUNTER — Inpatient Hospital Stay (HOSPITAL_COMMUNITY): Payer: Commercial Managed Care - PPO | Admitting: Certified Registered Nurse Anesthetist

## 2015-01-19 ENCOUNTER — Encounter (HOSPITAL_COMMUNITY): Payer: Self-pay | Admitting: *Deleted

## 2015-01-19 ENCOUNTER — Inpatient Hospital Stay (HOSPITAL_COMMUNITY)
Admission: RE | Admit: 2015-01-19 | Discharge: 2015-01-21 | DRG: 657 | Disposition: A | Discharge status: Home / Self Care | Payer: Commercial Managed Care - PPO | Source: Ambulatory Visit | Attending: Urology | Admitting: Urology

## 2015-01-19 ENCOUNTER — Encounter (HOSPITAL_COMMUNITY)
Admission: RE | Disposition: A | Discharge status: Home / Self Care | Payer: Self-pay | Source: Ambulatory Visit | Attending: Urology

## 2015-01-19 DIAGNOSIS — Q612 Polycystic kidney, adult type: Secondary | ICD-10-CM

## 2015-01-19 DIAGNOSIS — Z91041 Radiographic dye allergy status: Secondary | ICD-10-CM

## 2015-01-19 DIAGNOSIS — N183 Chronic kidney disease, stage 3 (moderate): Secondary | ICD-10-CM | POA: Diagnosis present

## 2015-01-19 DIAGNOSIS — K219 Gastro-esophageal reflux disease without esophagitis: Secondary | ICD-10-CM | POA: Diagnosis present

## 2015-01-19 DIAGNOSIS — E785 Hyperlipidemia, unspecified: Secondary | ICD-10-CM | POA: Diagnosis present

## 2015-01-19 DIAGNOSIS — I129 Hypertensive chronic kidney disease with stage 1 through stage 4 chronic kidney disease, or unspecified chronic kidney disease: Secondary | ICD-10-CM | POA: Diagnosis present

## 2015-01-19 DIAGNOSIS — Z23 Encounter for immunization: Secondary | ICD-10-CM

## 2015-01-19 DIAGNOSIS — C642 Malignant neoplasm of left kidney, except renal pelvis: Secondary | ICD-10-CM | POA: Diagnosis present

## 2015-01-19 DIAGNOSIS — N2889 Other specified disorders of kidney and ureter: Secondary | ICD-10-CM | POA: Diagnosis present

## 2015-01-19 DIAGNOSIS — G4730 Sleep apnea, unspecified: Secondary | ICD-10-CM | POA: Diagnosis present

## 2015-01-19 HISTORY — PX: ROBOT ASSISTED LAPAROSCOPIC NEPHRECTOMY: SHX5140

## 2015-01-19 LAB — BASIC METABOLIC PANEL
ANION GAP: 5 (ref 5–15)
BUN: 23 mg/dL (ref 6–23)
CALCIUM: 10.3 mg/dL (ref 8.4–10.5)
CO2: 24 mmol/L (ref 19–32)
Chloride: 110 mmol/L (ref 96–112)
Creatinine, Ser: 1.82 mg/dL — ABNORMAL HIGH (ref 0.50–1.35)
GFR calc Af Amer: 45 mL/min — ABNORMAL LOW (ref >90)
GFR calc non Af Amer: 39 mL/min — ABNORMAL LOW (ref >90)
GLUCOSE: 145 mg/dL — AB (ref 70–99)
Potassium: 3.8 mmol/L (ref 3.5–5.1)
Sodium: 139 mmol/L (ref 135–145)

## 2015-01-19 LAB — TYPE AND SCREEN
ABO/RH(D): O POS
Antibody Screen: NEGATIVE

## 2015-01-19 LAB — HEMOGLOBIN AND HEMATOCRIT, BLOOD
HCT: 41.6 % (ref 39.0–52.0)
HEMOGLOBIN: 13.4 g/dL (ref 13.0–17.0)

## 2015-01-19 LAB — ABO/RH: ABO/RH(D): O POS

## 2015-01-19 SURGERY — ROBOTIC ASSISTED LAPAROSCOPIC NEPHRECTOMY
Anesthesia: General | Laterality: Left

## 2015-01-19 MED ORDER — DEXTROSE 5 % IV SOLN
INTRAVENOUS | Status: AC
  Filled 2015-01-19: qty 2

## 2015-01-19 MED ORDER — CISATRACURIUM BESYLATE (PF) 10 MG/5ML IV SOLN
INTRAVENOUS | Status: DC | PRN
  Administered 2015-01-19: 8 mg via INTRAVENOUS
  Administered 2015-01-19: 4 mg via INTRAVENOUS

## 2015-01-19 MED ORDER — GLYCOPYRROLATE 0.2 MG/ML IJ SOLN
INTRAMUSCULAR | Status: AC
  Filled 2015-01-19: qty 4

## 2015-01-19 MED ORDER — HYDROMORPHONE HCL 1 MG/ML IJ SOLN
INTRAMUSCULAR | Status: DC | PRN
  Administered 2015-01-19 (×2): 0.5 mg via INTRAVENOUS

## 2015-01-19 MED ORDER — HYDROMORPHONE HCL 2 MG/ML IJ SOLN
INTRAMUSCULAR | Status: AC
  Filled 2015-01-19: qty 1

## 2015-01-19 MED ORDER — HYDROMORPHONE HCL 1 MG/ML IJ SOLN
0.2500 mg | INTRAMUSCULAR | Status: DC | PRN
  Administered 2015-01-19 (×4): 0.5 mg via INTRAVENOUS

## 2015-01-19 MED ORDER — HYDROMORPHONE HCL 1 MG/ML IJ SOLN
INTRAMUSCULAR | Status: AC
  Filled 2015-01-19: qty 1

## 2015-01-19 MED ORDER — SODIUM CHLORIDE 0.9 % IJ SOLN
INTRAMUSCULAR | Status: AC
  Filled 2015-01-19: qty 10

## 2015-01-19 MED ORDER — FENTANYL CITRATE (PF) 100 MCG/2ML IJ SOLN
INTRAMUSCULAR | Status: DC | PRN
  Administered 2015-01-19 (×5): 50 ug via INTRAVENOUS

## 2015-01-19 MED ORDER — LACTATED RINGERS IV SOLN
INTRAVENOUS | Status: DC
  Administered 2015-01-19: 14:00:00 via INTRAVENOUS
  Administered 2015-01-19: 1000 mL via INTRAVENOUS
  Administered 2015-01-19: 13:00:00 via INTRAVENOUS

## 2015-01-19 MED ORDER — ONDANSETRON HCL 4 MG/2ML IJ SOLN: 4.0000 mg | INTRAMUSCULAR | Status: DC | PRN

## 2015-01-19 MED ORDER — DEXAMETHASONE SODIUM PHOSPHATE 10 MG/ML IJ SOLN
INTRAMUSCULAR | Status: DC | PRN
  Administered 2015-01-19 (×2): 10 mg via INTRAVENOUS

## 2015-01-19 MED ORDER — PROPOFOL 10 MG/ML IV BOLUS
INTRAVENOUS | Status: DC | PRN
  Administered 2015-01-19: 150 mg via INTRAVENOUS

## 2015-01-19 MED ORDER — AMLODIPINE BESYLATE 10 MG PO TABS
10.0000 mg | ORAL_TABLET | Freq: Every morning | ORAL | Status: DC
  Administered 2015-01-20 – 2015-01-21 (×2): 10 mg via ORAL
  Filled 2015-01-19 (×2): qty 1

## 2015-01-19 MED ORDER — PROPOFOL 10 MG/ML IV BOLUS
INTRAVENOUS | Status: AC
  Filled 2015-01-19: qty 20

## 2015-01-19 MED ORDER — ONDANSETRON HCL 4 MG/2ML IJ SOLN
INTRAMUSCULAR | Status: AC
  Filled 2015-01-19: qty 2

## 2015-01-19 MED ORDER — MIDAZOLAM HCL 2 MG/2ML IJ SOLN
INTRAMUSCULAR | Status: AC
  Filled 2015-01-19: qty 2

## 2015-01-19 MED ORDER — ONDANSETRON HCL 4 MG/2ML IJ SOLN
INTRAMUSCULAR | Status: DC | PRN
  Administered 2015-01-19: 4 mg via INTRAVENOUS

## 2015-01-19 MED ORDER — CEFTRIAXONE SODIUM IN DEXTROSE 20 MG/ML IV SOLN: 1.0000 g | INTRAVENOUS | Status: DC

## 2015-01-19 MED ORDER — DEXAMETHASONE SODIUM PHOSPHATE 10 MG/ML IJ SOLN
INTRAMUSCULAR | Status: AC
  Filled 2015-01-19: qty 1

## 2015-01-19 MED ORDER — EPHEDRINE SULFATE 50 MG/ML IJ SOLN
INTRAMUSCULAR | Status: AC
  Filled 2015-01-19: qty 1

## 2015-01-19 MED ORDER — SUCCINYLCHOLINE CHLORIDE 20 MG/ML IJ SOLN
INTRAMUSCULAR | Status: DC | PRN
  Administered 2015-01-19: 100 mg via INTRAVENOUS

## 2015-01-19 MED ORDER — BUPIVACAINE LIPOSOME 1.3 % IJ SUSP
20.0000 mL | Freq: Once | INTRAMUSCULAR | Status: AC
  Administered 2015-01-19: 20 mL
  Filled 2015-01-19: qty 20

## 2015-01-19 MED ORDER — LACTATED RINGERS IV SOLN
INTRAVENOUS | Status: DC
Start: 2015-01-19 — End: 2015-01-19

## 2015-01-19 MED ORDER — NEOSTIGMINE METHYLSULFATE 10 MG/10ML IV SOLN
INTRAVENOUS | Status: DC | PRN
  Administered 2015-01-19: 5 mg via INTRAVENOUS

## 2015-01-19 MED ORDER — DEXTROSE-NACL 5-0.45 % IV SOLN
INTRAVENOUS | Status: DC
  Administered 2015-01-19: 17:00:00 via INTRAVENOUS

## 2015-01-19 MED ORDER — LIDOCAINE HCL (CARDIAC) 20 MG/ML IV SOLN
INTRAVENOUS | Status: DC | PRN
  Administered 2015-01-19: 100 mg via INTRAVENOUS

## 2015-01-19 MED ORDER — OXYCODONE HCL 5 MG PO TABS
5.0000 mg | ORAL_TABLET | ORAL | Status: DC | PRN
  Administered 2015-01-20 – 2015-01-21 (×5): 5 mg via ORAL
  Filled 2015-01-19 (×6): qty 1

## 2015-01-19 MED ORDER — MANNITOL 25 % IV SOLN
25.0000 g | Freq: Once | INTRAVENOUS | Status: DC
  Filled 2015-01-19: qty 100

## 2015-01-19 MED ORDER — HYDROMORPHONE HCL 1 MG/ML IJ SOLN
0.5000 mg | INTRAMUSCULAR | Status: DC | PRN
  Filled 2015-01-19: qty 1

## 2015-01-19 MED ORDER — EPHEDRINE SULFATE 50 MG/ML IJ SOLN
INTRAMUSCULAR | Status: DC | PRN
  Administered 2015-01-19: 10 mg via INTRAVENOUS
  Administered 2015-01-19: 5 mg via INTRAVENOUS
  Administered 2015-01-19: 10 mg via INTRAVENOUS
  Administered 2015-01-19: 5 mg via INTRAVENOUS
  Administered 2015-01-19: 10 mg via INTRAVENOUS

## 2015-01-19 MED ORDER — DEXTROSE 5 % IV SOLN
2.0000 g | INTRAVENOUS | Status: AC
  Administered 2015-01-19: 2 g via INTRAVENOUS

## 2015-01-19 MED ORDER — MIDAZOLAM HCL 5 MG/5ML IJ SOLN
INTRAMUSCULAR | Status: DC | PRN
  Administered 2015-01-19: 2 mg via INTRAVENOUS

## 2015-01-19 MED ORDER — ACETAMINOPHEN 500 MG PO TABS
1000.0000 mg | ORAL_TABLET | Freq: Four times a day (QID) | ORAL | Status: AC
  Administered 2015-01-19 – 2015-01-20 (×4): 1000 mg via ORAL
  Filled 2015-01-19 (×5): qty 2

## 2015-01-19 MED ORDER — SODIUM CHLORIDE 0.9 % IJ SOLN
INTRAMUSCULAR | Status: AC
  Filled 2015-01-19: qty 20

## 2015-01-19 MED ORDER — GLYCOPYRROLATE 0.2 MG/ML IJ SOLN
INTRAMUSCULAR | Status: DC | PRN
  Administered 2015-01-19 (×2): 0.2 mg via INTRAVENOUS
  Administered 2015-01-19: 0.6 mg via INTRAVENOUS

## 2015-01-19 MED ORDER — CISATRACURIUM BESYLATE 20 MG/10ML IV SOLN
INTRAVENOUS | Status: AC
Start: 2015-01-19 — End: 2015-01-19
  Filled 2015-01-19: qty 10

## 2015-01-19 MED ORDER — LOSARTAN POTASSIUM 50 MG PO TABS
100.0000 mg | ORAL_TABLET | Freq: Every morning | ORAL | Status: DC
  Administered 2015-01-20 – 2015-01-21 (×2): 100 mg via ORAL
  Filled 2015-01-19 (×2): qty 2

## 2015-01-19 MED ORDER — FENTANYL CITRATE (PF) 250 MCG/5ML IJ SOLN
INTRAMUSCULAR | Status: AC
Start: 2015-01-19 — End: 2015-01-19
  Filled 2015-01-19: qty 5

## 2015-01-19 MED ORDER — ALLOPURINOL 300 MG PO TABS
300.0000 mg | ORAL_TABLET | Freq: Every day | ORAL | Status: DC
  Administered 2015-01-19 – 2015-01-21 (×3): 300 mg via ORAL
  Filled 2015-01-19 (×3): qty 1

## 2015-01-19 MED ORDER — PNEUMOCOCCAL VAC POLYVALENT 25 MCG/0.5ML IJ INJ
0.5000 mL | INJECTION | INTRAMUSCULAR | Status: AC
  Administered 2015-01-20: 0.5 mL via INTRAMUSCULAR
  Filled 2015-01-19 (×2): qty 0.5

## 2015-01-19 MED ORDER — LIDOCAINE HCL (CARDIAC) 20 MG/ML IV SOLN
INTRAVENOUS | Status: AC
  Filled 2015-01-19: qty 5

## 2015-01-19 MED ORDER — HYDROCODONE-ACETAMINOPHEN 5-325 MG PO TABS: 1.0000 | ORAL_TABLET | Freq: Four times a day (QID) | ORAL | Status: DC | PRN

## 2015-01-19 MED ORDER — BUPIVACAINE-EPINEPHRINE (PF) 0.25% -1:200000 IJ SOLN
INTRAMUSCULAR | Status: AC
  Filled 2015-01-19: qty 30

## 2015-01-19 SURGICAL SUPPLY — 66 items
ADH SKN CLS APL DERMABOND .7 (GAUZE/BANDAGES/DRESSINGS) ×1
CHLORAPREP W/TINT 26ML (MISCELLANEOUS) ×2 IMPLANT
CLIP LIGATING HEM O LOK PURPLE (MISCELLANEOUS) ×1 IMPLANT
CLIP LIGATING HEMO LOK XL GOLD (MISCELLANEOUS) ×3 IMPLANT
CLIP LIGATING HEMO O LOK GREEN (MISCELLANEOUS) ×1 IMPLANT
CORDS BIPOLAR (ELECTRODE) ×1 IMPLANT
COVER SURGICAL LIGHT HANDLE (MISCELLANEOUS) ×2 IMPLANT
COVER TIP SHEARS 8 DVNC (MISCELLANEOUS) ×1 IMPLANT
COVER TIP SHEARS 8MM DA VINCI (MISCELLANEOUS) ×1
CUTTER ECHEON FLEX ENDO 45 340 (ENDOMECHANICALS) ×5 IMPLANT
DECANTER SPIKE VIAL GLASS SM (MISCELLANEOUS) ×2 IMPLANT
DERMABOND ADVANCED (GAUZE/BANDAGES/DRESSINGS) ×1
DERMABOND ADVANCED .7 DNX12 (GAUZE/BANDAGES/DRESSINGS) IMPLANT
DRAIN CHANNEL 15F RND FF 3/16 (WOUND CARE) ×2 IMPLANT
DRAPE ARM DVNC X/XI (DISPOSABLE) IMPLANT
DRAPE COLUMN DVNC XI (DISPOSABLE) IMPLANT
DRAPE DA VINCI XI ARM (DISPOSABLE) ×4
DRAPE DA VINCI XI COLUMN (DISPOSABLE) ×1
DRAPE INCISE IOBAN 66X45 STRL (DRAPES) ×2 IMPLANT
DRAPE LAPAROSCOPIC ABDOMINAL (DRAPES) ×1 IMPLANT
DRAPE SHEET LG 3/4 BI-LAMINATE (DRAPES) ×2 IMPLANT
DRAPE TABLE BACK 44X90 PK DISP (DRAPES) ×1 IMPLANT
DRAPE WARM FLUID 44X44 (DRAPE) ×2 IMPLANT
ELECT PENCIL ROCKER SW 15FT (MISCELLANEOUS) ×1 IMPLANT
ELECT REM PT RETURN 9FT ADLT (ELECTROSURGICAL) ×4
ELECTRODE REM PT RTRN 9FT ADLT (ELECTROSURGICAL) ×2 IMPLANT
EVACUATOR SILICONE 100CC (DRAIN) ×2 IMPLANT
GLOVE BIOGEL M STRL SZ7.5 (GLOVE) ×4 IMPLANT
GOWN STRL REUS W/TWL LRG LVL3 (GOWN DISPOSABLE) ×4 IMPLANT
HOLDER FOLEY CATH W/STRAP (MISCELLANEOUS) ×1 IMPLANT
KIT ACCESSORY DA VINCI DISP (KITS)
KIT ACCESSORY DVNC DISP (KITS) ×1 IMPLANT
KIT BASIN OR (CUSTOM PROCEDURE TRAY) ×2 IMPLANT
LIQUID BAND (GAUZE/BANDAGES/DRESSINGS) ×4 IMPLANT
LOOP VESSEL MAXI BLUE (MISCELLANEOUS) ×1 IMPLANT
MARKER PEN SURG W/LABELS BLK (STERILIZATION PRODUCTS) ×1 IMPLANT
NDL INSUFFLATION 14GA 120MM (NEEDLE) ×1 IMPLANT
NEEDLE INSUFFLATION 14GA 120MM (NEEDLE) ×2 IMPLANT
PAD POSITIONING PINK XL (MISCELLANEOUS) IMPLANT
PENCIL BUTTON HOLSTER BLD 10FT (ELECTRODE) ×2 IMPLANT
POSITIONER SURGICAL ARM (MISCELLANEOUS) ×4 IMPLANT
POUCH ENDO CATCH II 15MM (MISCELLANEOUS) ×2 IMPLANT
RELOAD WH ECHELON 45 (STAPLE) ×8 IMPLANT
SEAL CANN UNIV 5-8 DVNC XI (MISCELLANEOUS) IMPLANT
SEAL XI 5MM-8MM UNIVERSAL (MISCELLANEOUS) ×4
SET TUBE IRRIG SUCTION NO TIP (IRRIGATION / IRRIGATOR) ×1 IMPLANT
SHEET LAVH (DRAPES) IMPLANT
SOLUTION ANTI FOG 6CC (MISCELLANEOUS) ×2 IMPLANT
SOLUTION ELECTROLUBE (MISCELLANEOUS) ×2 IMPLANT
SPONGE LAP 18X18 X RAY DECT (DISPOSABLE) ×2 IMPLANT
SPONGE LAP 4X18 X RAY DECT (DISPOSABLE) ×2 IMPLANT
SUT ETHILON 3 0 PS 1 (SUTURE) ×2 IMPLANT
SUT MNCRL AB 4-0 PS2 18 (SUTURE) ×4 IMPLANT
SUT PDS AB 1 CT1 27 (SUTURE) ×5 IMPLANT
SUT VIC AB 3-0 SH 27 (SUTURE) ×2
SUT VIC AB 3-0 SH 27XBRD (SUTURE) IMPLANT
SUT VICRYL 0 UR6 27IN ABS (SUTURE) ×4 IMPLANT
SYR BULB IRRIGATION 50ML (SYRINGE) IMPLANT
TOWEL OR NON WOVEN STRL DISP B (DISPOSABLE) ×3 IMPLANT
TRAY FOLEY BAG SILVER LF 16FR (CATHETERS) ×1 IMPLANT
TRAY FOLEY W/METER SILVER 14FR (SET/KITS/TRAYS/PACK) ×1 IMPLANT
TRAY LAPAROSCOPIC (CUSTOM PROCEDURE TRAY) ×2 IMPLANT
TROCAR 12M 150ML BLUNT (TROCAR) ×2 IMPLANT
TROCAR XCEL 12X100 BLDLESS (ENDOMECHANICALS) ×2 IMPLANT
TUBING INSUFFLATION 10FT LAP (TUBING) ×2 IMPLANT
WATER STERILE IRR 1500ML POUR (IV SOLUTION) ×4 IMPLANT

## 2015-01-19 NOTE — Transfer of Care (Signed)
Immediate Anesthesia Transfer of Care Note  Patient: Phillip Blair  Procedure(s) Performed: Procedure(s): ROBOTIC ASSISTED LAPAROSCOPIC RADICAL NEPHRECTOMY AND INTRAOPERATIVE ULTRASOUND  (Left)  Patient Location: PACU  Anesthesia Type:General  Level of Consciousness: awake, alert , oriented and patient cooperative  Airway & Oxygen Therapy: Patient Spontanous Breathing and Patient connected to face mask oxygen  Post-op Assessment: Report given to RN, Post -op Vital signs reviewed and stable and Patient moving all extremities  Post vital signs: Reviewed and stable  Last Vitals:  Filed Vitals:   01/19/15 1049  BP: 151/90  Pulse: 77  Temp: 36.4 C  Resp: 18    Complications: No apparent anesthesia complications

## 2015-01-19 NOTE — Brief Op Note (Signed)
01/19/2015  3:04 PM  PATIENT:  Phillip Blair  59 y.o. male  PRE-OPERATIVE DIAGNOSIS:  LARGE LEFT RENAL MASS, POLYCYSTIC KIDNEY DISEASE  POST-OPERATIVE DIAGNOSIS:  large left renal mass, polycystic kidney disease  PROCEDURE:  Procedure(s): ROBOTIC ASSISTED LAPAROSCOPIC RADICAL NEPHRECTOMY AND INTRAOPERATIVE ULTRASOUND  (Left)  SURGEON:  Surgeon(s) and Role:    * Alexis Frock, MD - Primary  PHYSICIAN ASSISTANT:   ASSISTANTS: Clemetine Marker, PA   ANESTHESIA:   local and general  EBL:  Total I/O In: 2000 [I.V.:2000] Out: 300 [Urine:250; Blood:50]  BLOOD ADMINISTERED:none  DRAINS: Foley to straight drain   LOCAL MEDICATIONS USED:  MARCAINE     SPECIMEN:  Source of Specimen:  Left kidney with adrenal gland  DISPOSITION OF SPECIMEN:  PATHOLOGY  COUNTS:  YES  TOURNIQUET:  * No tourniquets in log *  DICTATION: .Other Dictation: Dictation Number [id number]  PLAN OF CARE: Admit to inpatient   PATIENT DISPOSITION:  PACU - hemodynamically stable.   Delay start of Pharmacological VTE agent (>24hrs) due to surgical blood loss or risk of bleeding: yes

## 2015-01-19 NOTE — Anesthesia Postprocedure Evaluation (Signed)
  Anesthesia Post-op Note  Patient: Phillip Blair  Procedure(s) Performed: Procedure(s) (LRB): ROBOTIC ASSISTED LAPAROSCOPIC RADICAL NEPHRECTOMY AND INTRAOPERATIVE ULTRASOUND  (Left)  Patient Location: PACU  Anesthesia Type: General  Level of Consciousness: awake and alert   Airway and Oxygen Therapy: Patient Spontanous Breathing  Post-op Pain: mild  Post-op Assessment: Post-op Vital signs reviewed, Patient's Cardiovascular Status Stable, Respiratory Function Stable, Patent Airway and No signs of Nausea or vomiting  Last Vitals:  Filed Vitals:   01/19/15 1615  BP:   Pulse:   Temp: 36.7 C  Resp:     Post-op Vital Signs: stable   Complications: No apparent anesthesia complications

## 2015-01-19 NOTE — H&P (Signed)
Phillip Blair is an 59 y.o. male.    Chief Complaint: Pre-OP Left Partial V. Radical Nephrectomy  HPI:   1 - Large left renal mass - 9cm left lower renal mass by CT and confimred on MRI 11/2014. 2 artery (one upper accessory) 1 vein renovascular anatomy. Mass crosses polar line and involves slightly over 1/2 of left kidney. No local adenopathy or obvious distant disease.  2 - Adult Polycystic Kidney Disease - pt with hundreds of renal cysts bilaterally on imaging 2016. No FHX known PCKD. He has 4 children ages 27+.   3 - Stage 3 Chronic Renal Failure - Cr 1.75-2 / GFR upper 30s by serum labs 2016. Now established with Markus Jarvis at [facility].   PMH sig for parathyroidism s/p parathyroid surgery x several. NO CV disesae. No blood thinners. His PCP is Yaakov Guthrie MD.   Today Dishon is seen to proceed with left robotic partial v. Radical nephrectomy for his worrisome left renal mass.   Past Medical History  Diagnosis Date  . Allergy   . Hyperlipidemia   . Gout   . Sleep apnea     uses CPAP  . Hypertension     does not see a cardiologist  . Parathyroid adenoma   . Hypercalcemia   . GERD (gastroesophageal reflux disease)     occ  . Chronic kidney disease     denies    Past Surgical History  Procedure Laterality Date  . Vasectomy    . Nasal septoplasty w/ turbinoplasty    . Minimally invasive radioactive parathyroidectomy N/A 05/04/2013    Procedure: PARATHYROIDECTOMY MINIMALLY INVASIVE;  Surgeon: Harl Bowie, MD;  Location: [facility];  Service: General;  Laterality: N/A;  . Minimally invasive radioactive parathyroidectomy N/A 10/20/2013    Procedure: MINIMALLY INVASIVE PARATHYROIDECTOMY CONVERTED TO COMPLETE NECK EXPLORATION;  Surgeon: Harl Bowie, MD;  Location: [facility];  Service: General;  Laterality: N/A;    No family history on file. Social History:  reports that he has never smoked. He has never used smokeless tobacco. He reports that he does not  drink alcohol or use illicit drugs.  Allergies:  Allergies  Allergen Reactions  . Ivp Dye [Iodinated Diagnostic Agents] Itching    Then peeling of skin    No prescriptions prior to admission    No results found for this or any previous visit (from the past 48 hour(s)). No results found.  Review of Systems  Constitutional: Negative.  Negative for fever and chills.  HENT: Negative.   Eyes: Negative.   Respiratory: Negative.   Cardiovascular: Negative.   Gastrointestinal: Negative.   Genitourinary: Negative.   Musculoskeletal: Negative.   Skin: Negative.   Neurological: Negative.   Endo/Heme/Allergies: Negative.   Psychiatric/Behavioral: Negative.     There were no vitals taken for this visit. Physical Exam  Constitutional: He appears well-developed.  HENT:  Head: Normocephalic.  Eyes: Pupils are equal, round, and reactive to light.  Cardiovascular: Normal rate.   Respiratory: Effort normal.  GI: Soft.  Genitourinary:  No CVAT  Musculoskeletal: Normal range of motion.  Neurological: He is alert.  Skin: Skin is warm.  Psychiatric: He has a normal mood and affect. His behavior is normal. Judgment and thought content normal.     Assessment/Plan       1 - Large left renal mass - difficult situation with large mass that crosses midpole of kidney in setting of renal insuficiency and PCKD. Rediscussed very frankly competing goals of  maintaining renal function and complete tumor excision. I told them that for stage 2 masses such as this oncologic outcomes are superior with radical nephrectomy, but that would certainly lead to more overall GFR decline v. what would be technically very challenging partial nephrectomy that at best might save 1/3 of left kidney's function.  Presently they are leaning towards radical nephrecotmy given oncologic concern and I told them I am happy to go with that unless mass appears exceptionally favorable for partial intra-operatively, we are in  agreement to proceed with oncologic outcome as first priority.   We rediscussed the role of partial/radical nephrectomy with the overall goals being a balance of trying to achieve complete surgical excision (negative margins) while minimizing loss of normally functioning kidney. We then rediscussed surgical approaches including robotic and open techniques with robotic associated with a shorter convalescence. I showed the patient on their abdomen the approximately 4-6 incision (trocar) sites as well as presumed extraction sites with robotic approach as well as possible open incision sites. We specifically readdressed that there may be need to alter operative plans according to intraopertive findings including conversion to open procedure or conversion to radical nephrectomy as well as need for adjunctive procedures such as ureteral stenting to promote correct renal healing. We rediscussed specific peri-operative risks including bleeding, infection, deep vein thrombosis, pulmonary embolism, compartment syndrome, neuropathy / neuropraxia, heart attack, stroke, death, as well as long-term risks such as non-cure / need for additional therapy and need for imaging and lab based post-op surveillance protocols. We rediscussed typical hospital course of approximately 2 day hospitalization, need for peri-operative drains / catheters, and typical post-hospital course with return to most non-strenuous activities by 2 weeks and ability to return to most jobs and more strenuous activity such as exercise by 6 weeks.    2 - Adult Polycystic Kidney Disease - now established with [name] and nephrology. Reinforced reccomendation for ALL children to be screened with renal US.  3 - Stage 3 Chronic Renal Failure - now established with nephrology. It is very likely he will progress to renal replacement in next 5-10 years.   Zen Felling 01/19/2015, 6:42 AM

## 2015-01-19 NOTE — Discharge Instructions (Signed)
1.  Activity:  You are encouraged to ambulate frequently (about every hour during waking hours) to help prevent blood clots from forming in your legs or lungs.  However, you should not engage in any heavy lifting (> 10-15 lbs), strenuous activity, or straining. °2. Diet: You should advance your diet as instructed by your physician.  It will be normal to have some bloating, nausea, and abdominal discomfort intermittently. °3. Prescriptions:  You will be provided a prescription for pain medication to take as needed.  If your pain is not severe enough to require the prescription pain medication, you may take extra strength Tylenol instead which will have less side effects.  You should also take a prescribed stool softener to avoid straining with bowel movements as the prescription pain medication may constipate you. °4. Incisions: You may remove your dressing bandages 48 hours after surgery if not removed in the hospital.  You will either have some small staples or special tissue glue at each of the incision sites. Once the bandages are removed (if present), the incisions may stay open to air.  You may start showering (but not soaking or bathing in water) the 2nd day after surgery and the incisions simply need to be patted dry after the shower.  No additional care is needed. °5. What to call us about: You should call the office ([phone]) if you develop fever > 101 or develop persistent vomiting. °      6. You may resume aspirin, advil, aleve, vitamins, and supplements 7 days after surgery. °

## 2015-01-19 NOTE — Anesthesia Procedure Notes (Signed)
Procedure Name: Intubation Date/Time: 01/19/2015 12:21 PM Performed by: Maxwell Caul Pre-anesthesia Checklist: Patient identified, Emergency Drugs available, Suction available and Patient being monitored Patient Re-evaluated:Patient Re-evaluated prior to inductionOxygen Delivery Method: Circle System Utilized Preoxygenation: Pre-oxygenation with 100% oxygen Intubation Type: IV induction Ventilation: Mask ventilation without difficulty Laryngoscope Size: Mac and 4 Grade View: Grade I Tube type: Oral Tube size: 7.5 mm Number of attempts: 1 Airway Equipment and Method: Stylet and Oral airway Placement Confirmation: ETT inserted through vocal cords under direct vision,  positive ETCO2 and breath sounds checked- equal and bilateral Secured at: 21 cm Tube secured with: Tape Dental Injury: Teeth and Oropharynx as per pre-operative assessment

## 2015-01-19 NOTE — Anesthesia Preprocedure Evaluation (Signed)
Anesthesia Evaluation  Patient identified by MRN, date of birth, ID band Patient awake    Reviewed: Allergy & Precautions, H&P , NPO status , Patient's Chart, lab work & pertinent test results  Airway Mallampati: II  TM Distance: >3 FB Neck ROM: full    Dental no notable dental hx. (+) Teeth Intact, Dental Advisory Given   Pulmonary sleep apnea and Continuous Positive Airway Pressure Ventilation ,  breath sounds clear to auscultation  Pulmonary exam normal       Cardiovascular Exercise Tolerance: Good hypertension, Pt. on medications Rhythm:regular Rate:Normal     Neuro/Psych negative neurological ROS  negative psych ROS   GI/Hepatic negative GI ROS, Neg liver ROS,   Endo/Other  negative endocrine ROSParathyroid adenoma with hypercalcemia  Renal/GU Renal InsufficiencyRenal diseaseCRT 1.91  negative genitourinary   Musculoskeletal   Abdominal   Peds  Hematology negative hematology ROS (+)   Anesthesia Other Findings   Reproductive/Obstetrics negative OB ROS                             Anesthesia Physical Anesthesia Plan  ASA: III  Anesthesia Plan: General   Post-op Pain Management:    Induction: Intravenous  Airway Management Planned: Oral ETT  Additional Equipment:   Intra-op Plan:   Post-operative Plan: Extubation in OR  Informed Consent: I have reviewed the patients History and Physical, chart, labs and discussed the procedure including the risks, benefits and alternatives for the proposed anesthesia with the patient or authorized representative who has indicated his/her understanding and acceptance.   Dental Advisory Given  Plan Discussed with: CRNA and Surgeon  Anesthesia Plan Comments:         Anesthesia Quick Evaluation

## 2015-01-20 ENCOUNTER — Encounter (HOSPITAL_COMMUNITY): Payer: Self-pay | Admitting: Urology

## 2015-01-20 LAB — HEMOGLOBIN AND HEMATOCRIT, BLOOD
HCT: 39.8 % (ref 39.0–52.0)
Hemoglobin: 12.4 g/dL — ABNORMAL LOW (ref 13.0–17.0)

## 2015-01-20 LAB — BASIC METABOLIC PANEL
Anion gap: 9 (ref 5–15)
BUN: 26 mg/dL — ABNORMAL HIGH (ref 6–23)
CHLORIDE: 105 mmol/L (ref 96–112)
CO2: 24 mmol/L (ref 19–32)
Calcium: 10.5 mg/dL (ref 8.4–10.5)
Creatinine, Ser: 2.49 mg/dL — ABNORMAL HIGH (ref 0.50–1.35)
GFR, EST AFRICAN AMERICAN: 31 mL/min — AB (ref >90)
GFR, EST NON AFRICAN AMERICAN: 27 mL/min — AB (ref >90)
Glucose, Bld: 155 mg/dL — ABNORMAL HIGH (ref 70–99)
POTASSIUM: 4 mmol/L (ref 3.5–5.1)
Sodium: 138 mmol/L (ref 135–145)

## 2015-01-20 MED ORDER — MENTHOL 3 MG MT LOZG
1.0000 | LOZENGE | OROMUCOSAL | Status: DC | PRN
  Filled 2015-01-20: qty 9

## 2015-01-20 NOTE — Op Note (Signed)
NAME:  Phillip Blair, Phillip Blair NO.:  [id number]  MEDICAL RECORD NO.:  [id number]  LOCATION:  [id number]                         FACILITY:  [facility]  PHYSICIAN:  Alexis Frock, MD     DATE OF BIRTH:  January 10, 1956  DATE OF PROCEDURE:  01/19/2015                               OPERATIVE REPORT   DIAGNOSES: 1. Large left renal mass. 2. Adult onset polycystic kidney disease. 3. Renal insufficiency.  PROCEDURE: 1. Robotic-assisted laparoscopic left radical nephrectomy. 2. Intraoperative ultrasound with interpretation.  ESTIMATED BLOOD LOSS:  50 mL.  COMPLICATIONS:  None.  SPECIMEN:  Left kidney with adrenal gland.  FINDINGS: 1. Single artery, single vein, left renovascular anatomy. 2. Numerous parasitic vessels including  arteries and veins     dominant ones including left upper lateral posterior from the     psoas area and numerous parasitic vein extensions to the left     gonadal vein. 3. Innumerable renal cysts given the cystic nature of the large mass,     the interface between mass and normal cystic kidney cannot be     determined by intraoperative ultrasound, and it was felt that     radical nephrectomy was the best way to proceed.  DRAINS:  Foley catheter to straight drain.  ASSISTANT:  Clemetine Marker, PA.  INDICATION:  Phillip Blair is a pleasant 59 year old gentleman with history of renal insufficiency due to likely autosomal dominant polycystic kidney disease.  He was found on surveillance imaging to have a large left renal mass, central necrosis, and parasitic vessels consistent with primary renal cell carcinoma.  The lesion was over 9 cm.  His contralateral kidney did not have any worrisome lesions other than polycystic kidney disease changes and his creatinine was approximately 2.  Options were discussed with the patient including surveillance protocol versus ablative therapies versus surgical extirpation with and without nephron sparing, and he wished to  proceed with minimally invasive left radical nephrectomy versus partial nephrectomy pending intraoperative findings.  We had discussed extensively preoperatively the chances of partial nephrectomy were very low given the significantly abnormal kidney adjacent to the mass.  Informed consent was obtained and placed in medical record.  PROCEDURE IN DETAIL:  The patient being Phillip Blair was verified. Procedure being left robotic radical versus partial nephrectomy was confirmed.  Procedure was carried out.  Time-out was performed. Intravenous antibiotics were administered.  General endotracheal anesthesia was introduced.  The patient placed into a left side up full flank position applying 15 degrees stable flexion, superior arm elevator, sequential compression devices, and bean bag, his bottom leg was bent and top leg was straight.  He was further fashioned on the operative table using 3-inch tape over foam padding across his chest, xiphoid area, and his pelvis.  Sterile field was created by prepping and draping the patient's entire left flank and hemi-abdomen using chlorhexidine gluconate and high-flow, low pressure pneumoperitoneum was obtained using Veress technique in the left lower quadrant having passed the aspiration and drop test.  Next, an 8 mm robotic camera port was placed in position approximately 4 fingerbreadths superior lateral to the umbilicus.  Laparoscopic examination of the peritoneal cavity revealed no significant adhesions and no visceral injury.  There was large mass effect from an obvious retroperitoneal mass consistent with large kidney mass.  Additional ports in place as follows; left subcostal 8-mm robotic port, left far lateral 8-mm robotic port approximately 4 fingerbreadths superior medial to the anterior-superior iliac spine, left paramedian inferior robotic port approximately 4 fingerbreadths superior to the pubic symphysis, 12 mm assistant port in the  midline just above the umbilicus, and another approximately 2 fingerbreadths above the camera port in the midline.  Robot was docked and passed through electronic checks.  Initial attention was directed to the development of the retroperitoneum.  Incision was made lateral to the descending colon from the area of the splenic flexure towards the area of the internal ring and this was carefully mobilized medially.  Lateral splenic attachments were taken down between that and the lateral abdominal wall and diaphragm allowing the spleen to rotate immediately plus performing self retraction of the spleen.  The pancreas was identified and carefully rotated medially away from the anterior surface of the kidney.  The lower pole of the kidney was clearly abnormal visually with large mass emanating from it with a solid and cystic components, this was placed on gentle lateral traction.  Dissection proceeded medial to this and the gonadal vein and the ureter were encountered, the ureter was marked with the vessel loop at this time. Dissection proceeded superiorly within this triangle towards the renal hilum.  The renal hilum consisted of single artery, single vein, these were also marked with vessel loops.  There were numerous parasitic vessels seen between the mass and the gonadal vein as anticipated.  Lateral dissection was performed between the abdominal wall lateral surface of the kidney.  There were several parasitic vessels including 2 separate artery vein complexes, these were controlled using surgical clips.  It was felt that the area of mass had been circumferentially mobilized and the dominant renal vessel was identified and marked. Next, dissection proceeded directly on to the anterior surface of the kidney, unable to identify the junction between normal polycystic parenchyma versus the worrisome mass.  Grossly given the variegated appearance of the polycystic kidney, the junction between  the mass and the kidney cannot be visualized.  Intraoperative ultrasound was then performed using prepping.  Intraoperative ultrasound revealed innumerable kidney cysts through the upper pole area as well as the area of worrisome mass.  Multiple attempts were made to identify a clear junction by ultrasonography between the obvious mass components and the normal parenchyma, and no suitable junction was seen using ultrasound.  This was performed in the anterior plane and lateral planes as well.  It was felt that at best one-third of the kidney may be able to be saved with partial nephrectomy.  Given this constellation of findings, it was felt that radical nephrectomy would clearly be the safest and most oncologically sound.    As such, the artery was controlled using extra large clip down followed by vascular stapler, distal vein was controlled proximal to the takeoff of the adrenal vein and gonadal using vascular stapler.  The plane between the adrenal tissue medial to this was controlled using vascular stapler.  All other tests were taken down using cautery scissors.  The gonadal vessels were controlled using vascular stapler and the ureter was doubly clipped and ligated.  This completely freed up the left radical nephrectomy. Specimen was placed into an extra large EndoCatch bag.  Final hemostasis appeared excellent.  Sponge and needle counts were correct.  Robot was then undocked.  Specimen  was retrieved by extending the previous inferior-most assistant port site for a total distance approximately 6 cm removing the nephrectomy specimen, and setting aside for permanent pathology.  The previous 2 mm superior port was closed at the level of fascia using Vicryl on a UR needle.  The extraction site was closed at the level of fascia using figure-of-eight PDS x6 followed by reapproximation of Scarpa's.  All incision sites were infiltrated with dilute lyophilized Marcaine and closed at  the level of the skin using subcuticular Monocryl followed by Dermabond. Procedure was then terminated.  The patient tolerated the procedure well.  There were no immediate periprocedural occasions.  The patient was taken to the postanesthesia care unit in a stable condition.          ______________________________ Alexis Frock, MD     TM/MEDQ  D:  01/19/2015  T:  01/20/2015  Job:  [id number]

## 2015-01-20 NOTE — Progress Notes (Signed)
1 Day Post-Op  Subjective:  1 - Large left renal mass in setting of polycystic kidney disease - s/p left robotic radical nephrectomy on 01/19/15 for large and endophytic left renal mass.   2 - Stage 3 Chronic Renal Failure - Cr 1.75-2 / GFR upper 30s by serum labs pre left nephrectomy. Follows with Markus Jarvis at [facility]. Cr 2.5 POD 1.   Today Phillip Blair is doing well. Pain controlled, ambulating, tollerating PO. Has voided some since foley DC'd this AM.  He continues to have low heart rate (above 40) without hypotension or symptoms.   Objective: Vital signs in last 24 hours: Temp:  [97.6 F (36.4 C)-98.1 F (36.7 C)] 97.6 F (36.4 C) (04/28 1339) Pulse Rate:  [45-83] 45 (04/28 1339) Resp:  [15-18] 18 (04/28 1339) BP: (126-152)/(67-87) 126/67 mmHg (04/28 1339) SpO2:  [99 %-100 %] 100 % (04/28 1339) Last BM Date: 01/18/15  Intake/Output from previous day: 04/27 0701 - 04/28 0700 In: 3785 [I.V.:3785] Out: 2150 [Urine:2100; Blood:50] Intake/Output this shift: Total I/O In: 480 [P.O.:480] Out: 400 [Urine:400]  General appearance: alert, cooperative and appears stated age Eyes: negative Nose: Nares normal. Septum midline. Mucosa normal. No drainage or sinus tenderness. Throat: lips, mucosa, and tongue normal; teeth and gums normal Neck: supple, symmetrical, trachea midline Back: symmetric, no curvature. ROM normal. No CVA tenderness. Resp: non-labored on [place] O2 Cardio: Rate 50s.  GI: soft, non-tender; bowel sounds normal; no masses,  no organomegaly Extremities: extremities normal, atraumatic, no cyanosis or edema Pulses: 2+ and symmetric Skin: Skin color, texture, turgor normal. No rashes or lesions Lymph nodes: Cervical, supraclavicular, and axillary nodes normal. Neurologic: Grossly normal Incision/Wound: Recent port sites / extraction sites c/d/i.   Lab Results:   Recent Labs  01/19/15 1537 01/20/15 0525  HGB 13.4 12.4*  HCT 41.6 39.8   BMET  Recent Labs  01/19/15 1537 01/20/15 0525  NA 139 138  K 3.8 4.0  CL 110 105  CO2 24 24  GLUCOSE 145* 155*  BUN 23 26*  CREATININE 1.82* 2.49*  CALCIUM 10.3 10.5   PT/INR No results for input(s): LABPROT, INR in the last 72 hours. ABG No results for input(s): PHART, HCO3 in the last 72 hours.  Invalid input(s): PCO2, PO2  Studies/Results: No results found.  Anti-infectives: Anti-infectives    Start     Dose/Rate Route Frequency Ordered Stop   01/19/15 1117  cefTRIAXone (ROCEPHIN) 2 g in dextrose 5 % 50 mL IVPB     2 g 100 mL/hr over 30 Minutes Intravenous 30 min pre-op 01/19/15 1117 01/19/15 1225   01/19/15 1049  cefTRIAXone (ROCEPHIN) 1 g in dextrose 5 % 50 mL IVPB - Premix  Status:  Discontinued     1 g 100 mL/hr over 30 Minutes Intravenous 30 min pre-op 01/19/15 1049 01/19/15 1117      Assessment/Plan:  1 - Large left renal mass in setting of polycystic kidney disease - doing well POD 1. DC foley, saline lock, ambulate, wean O2, likely DC in AM tomorrow.   2 - Stage 3 Chronic Renal Failure - GFR acceptable and with expected decline. Recheck BMP tomorrow.     [facility], Giuliano Preece 01/20/2015

## 2015-01-20 NOTE — Care Management Note (Addendum)
    Page 1 of 1   01/21/2015     1:21:12 PM CARE MANAGEMENT NOTE 01/21/2015  Patient:  Phillip Blair, Phillip Blair   Account Number:  [id number]  Date Initiated:  01/20/2015  Documentation initiated by:  Dessa Phi  Subjective/Objective Assessment:   59 y/o m admitted w/l renal mass.     Action/Plan:   From home.   Anticipated DC Date:  01/21/2015   Anticipated DC Plan:  [place]  CM consult      Choice offered to / List presented to:             Status of service:  Completed, signed off Medicare Important Message given?   (If response is "NO", the following Medicare IM given date fields will be blank) Date Medicare IM given:   Medicare IM given by:   Date Additional Medicare IM given:   Additional Medicare IM given by:    Discharge Disposition:  HOME/SELF CARE  Per UR Regulation:  Reviewed for med. necessity/level of care/duration of stay  If discussed at [place] of Stay Meetings, dates discussed:    Comments:  01/20/15 Dessa Phi RN BSN NCM [phone] POD#1 nephrectomy.No anticipated d/c needs.

## 2015-01-21 LAB — BASIC METABOLIC PANEL
Anion gap: 7 (ref 5–15)
BUN: 35 mg/dL — ABNORMAL HIGH (ref 6–23)
CO2: 25 mmol/L (ref 19–32)
CREATININE: 3.5 mg/dL — AB (ref 0.50–1.35)
Calcium: 10.2 mg/dL (ref 8.4–10.5)
Chloride: 107 mmol/L (ref 96–112)
GFR calc Af Amer: 20 mL/min — ABNORMAL LOW (ref >90)
GFR calc non Af Amer: 18 mL/min — ABNORMAL LOW (ref >90)
Glucose, Bld: 108 mg/dL — ABNORMAL HIGH (ref 70–99)
Potassium: 3.8 mmol/L (ref 3.5–5.1)
Sodium: 139 mmol/L (ref 135–145)

## 2015-01-21 MED ORDER — SENNOSIDES-DOCUSATE SODIUM 8.6-50 MG PO TABS: 1.0000 | ORAL_TABLET | Freq: Two times a day (BID) | ORAL | Status: DC

## 2015-01-21 NOTE — Discharge Summary (Addendum)
Physician Discharge Summary  Patient ID: Phillip Blair MRN: [id number] DOB/AGE: 1955-12-07 59 y.o.  Admit date: 01/19/2015 Discharge date: 01/21/2015  Admission Diagnoses: Large Left Renal Mass, Renal Insufficiency, Polycystic Kidney Disease  Discharge Diagnoses:  Active Problems:   Renal mass   Discharged Condition: good  Hospital Course:   1 - Large left renal mass in setting of polycystic kidney disease - s/p left robotic radical nephrectomy on 01/19/15 for large and endophytic left renal mass. Path pending at discharge.   2 - Stage 3 Chronic Renal Failure - Cr 1.75-2 / GFR upper 30s by serum labs pre left nephrectomy. Follows with Markus Jarvis at [facility]. Cr 2.5 POD 1 and 3.5 POD 2. K normal and maintaining volume status.   By POD 2, the day of discharge, pt is ambulatory, tollerating diet, pain controlled on PO meds, and with acceptable level of renal function. He will hold losartan for now and have BMP at next office visit.     Consults: None  Significant Diagnostic Studies: labs: as per above  Treatments: surgery:  left robotic radical nephrectomy on 01/19/15  Discharge Exam: Blood pressure 140/75, pulse 62, temperature 97.5 F (36.4 C), temperature source Oral, resp. rate 18, height 6' (1.829 m), weight 86.002 kg (189 lb 9.6 oz), SpO2 98 %. General appearance: alert, cooperative and appears stated age Head: Normocephalic, without obvious abnormality, atraumatic Eyes: negative Nose: Nares normal. Septum midline. Mucosa normal. No drainage or sinus tenderness. Throat: lips, mucosa, and tongue normal; teeth and gums normal Neck: supple, symmetrical, trachea midline Back: symmetric, no curvature. ROM normal. No CVA tenderness. Resp: non-labored on room air Cardio: Nl rate GI: soft, non-tender; bowel sounds normal; no masses,  no organomegaly Extremities: extremities normal, atraumatic, no cyanosis or edema Pulses: 2+ and symmetric Skin: Skin color,  texture, turgor normal. No rashes or lesions Lymph nodes: Cervical, supraclavicular, and axillary nodes normal. Neurologic: Grossly normal Incision/Wound: recent incision / extraction sites all c/d/i. No hernais.   Disposition: 01-Home or Self Care     Medication List    STOP taking these medications        cholecalciferol 1000 UNITS tablet  Commonly known as:  VITAMIN D     COLD MILLED GOLDEN FLAX SEED Powd     fish oil-omega-3 fatty acids 1000 MG capsule     ibuprofen 200 MG tablet  Commonly known as:  ADVIL,MOTRIN     losartan 100 MG tablet  Commonly known as:  COZAAR     pravastatin 20 MG tablet  Commonly known as:  PRAVACHOL      TAKE these medications        allopurinol 300 MG tablet  Commonly known as:  ZYLOPRIM  Take 300 mg by mouth daily.     amLODipine 10 MG tablet  Commonly known as:  NORVASC  Take 10 mg by mouth every morning.     HYDROcodone-acetaminophen 5-325 MG per tablet  Commonly known as:  NORCO  Take 1-2 tablets by mouth every 6 (six) hours as needed.     senna-docusate 8.6-50 MG per tablet  Commonly known as:  Senokot-S  Take 1 tablet by mouth 2 (two) times daily. While taking pain med to prevent constipation.           Follow-up Information    Follow up with Alexis Frock, MD On 02/14/2015.   Specialty:  Urology   Why:  at 12:45   Contact information:   [place] [address]  [phone]       Signed: Alexis Frock 01/21/2015, 7:01 AM   Final pathology confirms Stage 2 renal cancer as per patholgoy report below:  Pathology Report Final Diagnosis: Diagnosis Kidney, radical nephrectomy for tumor, left - CLEAR CELL RENAL CELL CARCINOMA, FURHMAN NUCLEAR GRADE II/IV, SPANNING 7.5 CM. - THERE IS NO EVIDENCE OF CARCINOMA IN 2 OF 2 HILAR LYMPH NODES (0.2). - THE SURGICAL RESECTION MARGINS ARE NEGATIVE FOR CARCINOMA. - BENIGN ADRENAL GLAND.

## 2015-05-16 ENCOUNTER — Other Ambulatory Visit: Payer: Self-pay | Admitting: *Deleted

## 2015-05-16 DIAGNOSIS — Z01810 Encounter for preprocedural cardiovascular examination: Secondary | ICD-10-CM

## 2015-06-08 ENCOUNTER — Encounter (HOSPITAL_COMMUNITY): Payer: Commercial Managed Care - PPO

## 2015-06-08 ENCOUNTER — Other Ambulatory Visit (HOSPITAL_COMMUNITY): Payer: Commercial Managed Care - PPO

## 2015-06-08 ENCOUNTER — Ambulatory Visit: Payer: Commercial Managed Care - PPO | Admitting: Vascular Surgery

## 2015-06-15 ENCOUNTER — Encounter: Payer: Self-pay | Admitting: Surgery

## 2015-06-20 ENCOUNTER — Encounter: Payer: Self-pay | Admitting: Surgery

## 2015-06-20 ENCOUNTER — Ambulatory Visit (HOSPITAL_COMMUNITY)
Admission: RE | Admit: 2015-06-20 | Discharge: 2015-06-20 | Disposition: A | Discharge status: Home / Self Care | Payer: Commercial Managed Care - PPO | Source: Ambulatory Visit | Attending: Surgery | Admitting: Surgery

## 2015-06-20 ENCOUNTER — Ambulatory Visit (INDEPENDENT_AMBULATORY_CARE_PROVIDER_SITE_OTHER)
Admission: RE | Admit: 2015-06-20 | Discharge: 2015-06-20 | Disposition: A | Discharge status: Home / Self Care | Payer: Commercial Managed Care - PPO | Source: Ambulatory Visit | Attending: Surgery | Admitting: Surgery

## 2015-06-20 ENCOUNTER — Ambulatory Visit (INDEPENDENT_AMBULATORY_CARE_PROVIDER_SITE_OTHER): Payer: Commercial Managed Care - PPO | Admitting: Surgery

## 2015-06-20 VITALS — BP 123/79 | HR 51 | Ht 72.0 in | Wt 174.0 lb

## 2015-06-20 DIAGNOSIS — N184 Chronic kidney disease, stage 4 (severe): Secondary | ICD-10-CM | POA: Diagnosis not present

## 2015-06-20 DIAGNOSIS — Z01810 Encounter for preprocedural cardiovascular examination: Secondary | ICD-10-CM

## 2015-06-20 NOTE — Progress Notes (Signed)
HISTORY AND PHYSICAL   History of Present Illness:  Patient is a 59 y.o. year old male who presents for placement of a permanent hemodialysis access. The patient is right handed .  The patient is not currently on hemodialysis.  The cause of renal failure is thought to be secondary to hypertension and PCKD.  Other chronic medical problems include .Hypertension managed on Norvasc and Lasix.  He denise DM.    Past Medical History  Diagnosis Date  . Allergy   . Hyperlipidemia   . Gout   . Sleep apnea     uses CPAP  . Hypertension     does not see a cardiologist  . Parathyroid adenoma   . Hypercalcemia   . GERD (gastroesophageal reflux disease)     occ  . Chronic kidney disease     denies  . Cancer     Past Surgical History  Procedure Laterality Date  . Vasectomy    . Nasal septoplasty w/ turbinoplasty    . Minimally invasive radioactive parathyroidectomy N/A 05/04/2013    Procedure: PARATHYROIDECTOMY MINIMALLY INVASIVE;  Surgeon: Harl Bowie, MD;  Location: [place];  Service: General;  Laterality: N/A;  . Minimally invasive radioactive parathyroidectomy N/A 10/20/2013    Procedure: MINIMALLY INVASIVE PARATHYROIDECTOMY CONVERTED TO COMPLETE NECK EXPLORATION;  Surgeon: Harl Bowie, MD;  Location: [place];  Service: General;  Laterality: N/A;  . Robot assisted laparoscopic nephrectomy Left 01/19/2015    Procedure: ROBOTIC ASSISTED LAPAROSCOPIC RADICAL NEPHRECTOMY AND INTRAOPERATIVE ULTRASOUND ;  Surgeon: Alexis Frock, MD;  Location: [facility];  Service: Urology;  Laterality: Left;     Social History Social History  Substance Use Topics  . Smoking status: Never Smoker   . Smokeless tobacco: Never Used  . Alcohol Use: No    Family History History reviewed. No pertinent family history.  Allergies  Allergies  Allergen Reactions  . Ivp Dye [Iodinated Diagnostic Agents] Itching    Then peeling of skin     Current Outpatient Prescriptions  Medication Sig  Dispense Refill  . allopurinol (ZYLOPRIM) 300 MG tablet Take 300 mg by mouth daily.    Marland Kitchen amLODipine (NORVASC) 10 MG tablet Take 10 mg by mouth every morning.     . furosemide (LASIX) 40 MG tablet Take 40 mg by mouth 2 (two) times daily.  5  . sodium bicarbonate 650 MG tablet Take 650 mg by mouth 2 (two) times daily.  6  . HYDROcodone-acetaminophen (NORCO) 5-325 MG per tablet Take 1-2 tablets by mouth every 6 (six) hours as needed. (Patient not taking: Reported on 06/20/2015) 30 tablet 0  . senna-docusate (SENOKOT-S) 8.6-50 MG per tablet Take 1 tablet by mouth 2 (two) times daily. While taking pain med to prevent constipation. (Patient not taking: Reported on 06/20/2015) 30 tablet 0   No current facility-administered medications for this visit.    ROS:   General:  No weight loss, Fever, chills  HEENT: No recent headaches, no nasal bleeding, no visual changes, no sore throat  Neurologic: No dizziness, blackouts, seizures. No recent symptoms of stroke or mini- stroke. No recent episodes of slurred speech, or temporary blindness.  Cardiac: No recent episodes of chest pain/pressure, no shortness of breath at rest.  No shortness of breath with exertion.  Denies history of atrial fibrillation or irregular heartbeat  Vascular: No history of rest pain in feet.  No history of claudication.  No history of non-healing ulcer, No history of DVT   Pulmonary: No home  oxygen, no productive cough, no hemoptysis,  No asthma or wheezing  Musculoskeletal:  [ ]  Arthritis, [ ]  Low back pain,  [ ]  Joint pain  Hematologic:No history of hypercoagulable state.  No history of easy bleeding.  No history of anemia  Gastrointestinal: No hematochezia or melena,  No gastroesophageal reflux, no trouble swallowing  Urinary: [ ]  chronic Kidney disease, [ ]  on HD - [ ]  MWF or [ ]  TTHS, [ ]  Burning with urination, [ ]  Frequent urination, [ ]  Difficulty urinating;   Skin: No rashes  Psychological: No history of anxiety,   No history of depression   Physical Examination  Filed Vitals:   06/20/15 1356  BP: 123/79  Pulse: 51  Height: 6' (1.829 m)  Weight: 174 lb (78.926 kg)  SpO2: 100%    Body mass index is 23.59 kg/(m^2).  General:  Alert and oriented, no acute distress HEENT: Normal Neck: No bruit or JVD Pulmonary: Clear to auscultation bilaterally Cardiac: Regular Rate and Rhythm without murmur Gastrointestinal: Soft, non-tender, non-distended, no mass, no scars Skin: No rash Extremity Pulses:  2+ radial, brachial pulses bilaterally Musculoskeletal: No deformity or edema  Neurologic: Upper and lower extremity motor 5/5 and symmetric  DATA:  UE arterial Duplex B UE triphasic flow UE vein mapping Marginal left basilic vein 0000000   ASSESSMENT:  CKD Stage IV    PLAN: His vein mapping shows that he has very small veins and this would likely led to a graft instead of a fistula for HD in the future.  He could also consider peritoneal HD.  We explained that we would still consider a fistula up until the actual surgery and make that final decision  intraoperatively.   He will F/U when he is closer to needed HD in the future per Dr. [name].  COLLINS, EMMA MAUREEN PA-C [facility]  The patient was seen today in conjunction with Dr. Trula Slade.    I agree with the above.  I have seen and evaluated the patient. He has stage IV renal disease.  He is right-handed. Vein mapping today showed very small cephalic and basilic veins bilaterally. I discussed with him that I think is past options for hemodialysis is going to be a left sided Gore-Tex graft. I would prefer to delay this until he is closer to starting dialysis.  I did discuss that I would evaluate his basilic vein in the operating room prior to placing a graft to see if he could potentially be a candidate for fistula but I would hold off and doing this until he gets closer to dialysis.  I also encouraged him  to discuss with his nephrologist getting on the transplant list and considering peritoneal dialysis. I told the patient to contact me when he gets closer to dialysis to schedule another appointment.  Annamarie Major

## 2015-07-20 ENCOUNTER — Ambulatory Visit
Admission: RE | Admit: 2015-07-20 | Discharge: 2015-07-20 | Disposition: A | Discharge status: Home / Self Care | Payer: Commercial Managed Care - PPO | Source: Ambulatory Visit | Attending: Family Medicine | Admitting: Family Medicine

## 2015-07-20 ENCOUNTER — Other Ambulatory Visit: Payer: Self-pay | Admitting: Family Medicine

## 2015-07-20 DIAGNOSIS — Z905 Acquired absence of kidney: Secondary | ICD-10-CM

## 2015-07-20 DIAGNOSIS — M545 Low back pain: Secondary | ICD-10-CM

## 2015-08-16 ENCOUNTER — Ambulatory Visit (HOSPITAL_COMMUNITY)
Admission: RE | Admit: 2015-08-16 | Discharge: 2015-08-16 | Disposition: A | Discharge status: Home / Self Care | Payer: Commercial Managed Care - PPO | Source: Ambulatory Visit | Attending: Urology | Admitting: Urology

## 2015-08-16 ENCOUNTER — Other Ambulatory Visit: Payer: Self-pay | Admitting: Urology

## 2015-08-16 DIAGNOSIS — N189 Chronic kidney disease, unspecified: Secondary | ICD-10-CM

## 2015-08-17 ENCOUNTER — Other Ambulatory Visit (HOSPITAL_COMMUNITY): Payer: Self-pay | Admitting: *Deleted

## 2015-08-19 ENCOUNTER — Encounter (HOSPITAL_COMMUNITY)
Admission: RE | Admit: 2015-08-19 | Discharge: 2015-08-19 | Disposition: A | Discharge status: Home / Self Care | Payer: Commercial Managed Care - PPO | Source: Ambulatory Visit | Attending: Nephrology | Admitting: Nephrology

## 2015-08-19 DIAGNOSIS — D509 Iron deficiency anemia, unspecified: Secondary | ICD-10-CM | POA: Insufficient documentation

## 2015-08-19 MED ORDER — SODIUM CHLORIDE 0.9 % IV SOLN
510.0000 mg | INTRAVENOUS | Status: DC
  Filled 2015-08-19: qty 17

## 2015-08-19 NOTE — Discharge Instructions (Signed)

## 2015-08-23 ENCOUNTER — Other Ambulatory Visit (HOSPITAL_COMMUNITY): Payer: Self-pay | Admitting: *Deleted

## 2015-08-24 ENCOUNTER — Encounter (HOSPITAL_COMMUNITY)
Admission: RE | Admit: 2015-08-24 | Discharge: 2015-08-24 | Disposition: A | Discharge status: Home / Self Care | Payer: Commercial Managed Care - PPO | Source: Ambulatory Visit | Attending: Nephrology | Admitting: Nephrology

## 2015-08-24 DIAGNOSIS — D509 Iron deficiency anemia, unspecified: Secondary | ICD-10-CM | POA: Diagnosis not present

## 2015-08-24 MED ORDER — SODIUM CHLORIDE 0.9 % IV SOLN
510.0000 mg | Freq: Once | INTRAVENOUS | Status: AC
  Administered 2015-08-24: 510 mg via INTRAVENOUS
  Filled 2015-08-24: qty 17

## 2016-02-13 ENCOUNTER — Other Ambulatory Visit: Payer: Self-pay | Admitting: Urology

## 2016-02-13 ENCOUNTER — Ambulatory Visit (HOSPITAL_COMMUNITY)
Admission: RE | Admit: 2016-02-13 | Discharge: 2016-02-13 | Disposition: A | Discharge status: Home / Self Care | Payer: 59 | Source: Ambulatory Visit | Attending: Urology | Admitting: Urology

## 2016-02-13 DIAGNOSIS — C642 Malignant neoplasm of left kidney, except renal pelvis: Secondary | ICD-10-CM | POA: Diagnosis present

## 2016-03-02 ENCOUNTER — Encounter: Payer: Self-pay | Admitting: Surgery

## 2016-03-05 ENCOUNTER — Ambulatory Visit (INDEPENDENT_AMBULATORY_CARE_PROVIDER_SITE_OTHER): Payer: 59 | Admitting: Surgery

## 2016-03-05 ENCOUNTER — Encounter: Payer: Self-pay | Admitting: Surgery

## 2016-03-05 VITALS — BP 117/77 | HR 48 | Temp 97.4°F | Resp 16 | Ht 72.0 in | Wt 176.0 lb

## 2016-03-05 DIAGNOSIS — N184 Chronic kidney disease, stage 4 (severe): Secondary | ICD-10-CM | POA: Diagnosis not present

## 2016-03-05 NOTE — Progress Notes (Signed)
[facility] [place]  Patient name: Phillip Blair MRN: [id number] DOB: 03-26-1956 Sex: male  REASON FOR VISIT: Follow-up  HPI: Phillip Blair is a 60 y.o. male with chronic renal insufficiency who I evaluated for dialysis access/year and felt that he had very small veins and might need a graft.  He is right-handed.  He comes back in today as his kidney function has deteriorated.  He wants to discuss additional access options.  He is still very interested in peritoneal dialysis.  Past Medical History  Diagnosis Date  . Allergy   . Hyperlipidemia   . Gout   . Sleep apnea     uses CPAP  . Hypertension     does not see a cardiologist  . Parathyroid adenoma   . Hypercalcemia   . GERD (gastroesophageal reflux disease)     occ  . Chronic kidney disease     denies  . Cancer [facility])     No family history on file.  SOCIAL HISTORY: Social History  Substance Use Topics  . Smoking status: Never Smoker   . Smokeless tobacco: Never Used  . Alcohol Use: No    Allergies  Allergen Reactions  . Ivp Dye [Iodinated Diagnostic Agents] Itching    Then peeling of skin    Current Outpatient Prescriptions  Medication Sig Dispense Refill  . allopurinol (ZYLOPRIM) 300 MG tablet Take 100 mg by mouth daily.     Marland Kitchen amLODipine (NORVASC) 10 MG tablet Take 10 mg by mouth every morning.     . furosemide (LASIX) 40 MG tablet Take 40 mg by mouth 2 (two) times daily.  5  . sodium bicarbonate 650 MG tablet Take 650 mg by mouth 2 (two) times daily.  6   No current facility-administered medications for this visit.    REVIEW OF SYSTEMS:  [X]  denotes positive finding, [ ]  denotes negative finding Cardiac  Comments:  Chest pain or chest pressure:    Shortness of breath upon exertion:    Short of breath when lying flat:    Irregular heart rhythm:        Vascular    Pain in calf, thigh, or hip brought on by ambulation:    Pain in feet  at night that wakes you up from your sleep:     Blood clot in your veins:    Leg swelling:         Pulmonary    Oxygen at home:    Productive cough:     Wheezing:         Neurologic    Sudden weakness in arms or legs:     Sudden numbness in arms or legs:     Sudden onset of difficulty speaking or slurred speech:    Temporary loss of vision in one eye:     Problems with dizziness:         Gastrointestinal    Blood in stool:     Vomited blood:         Genitourinary    Burning when urinating:     Blood in urine:        Psychiatric    Major depression:         Hematologic    Bleeding problems:    Problems with blood clotting too easily:        Skin    Rashes or ulcers:        Constitutional    Fever or chills:  PHYSICAL EXAM: Filed Vitals:   03/05/16 1142  BP: 117/77  Pulse: 48  Temp: 97.4 F (36.3 C)  TempSrc: Oral  Resp: 16  Height: 6' (1.829 m)  Weight: 176 lb (79.833 kg)  SpO2: 100%    GENERAL: The patient is a well-nourished male, in no acute distress. The vital signs are documented above. CARDIAC: There is a regular rate and rhythm.  VASCULAR: Palpable radial and brachial pulse PULMONARY: There is good air exchange bilaterally without wheezing or rales. MUSCULOSKELETAL: There are no major deformities or cyanosis. NEUROLOGIC: No focal weakness or paresthesias are detected. SKIN: There are no ulcers or rashes noted. PSYCHIATRIC: The patient has a normal affect.  DATA:  I have again reviewed his vein mapping which shows his left basilic vein measures [id number] at the antecubital crease and 0.19 at the shoulder  MEDICAL ISSUES: Chronic renal insufficiency:  I discussed with the patient that I would schedule him for a left forearm AVG G versus a first stage left basilic vein transposition.  I will evaluate his basilic vein in the operating room and if I feel that it is adequate for an attempt at fistula creation, I would preferentially use his basilic  vein.  If not I would place a graft.  He is interested in peritoneal dialysis and would like to further discuss this with Dr. [name].  He will contact me to schedule his surgery date.    Annamarie Major, MD [facility] [phone] Pager [phone]

## 2016-03-08 ENCOUNTER — Other Ambulatory Visit: Payer: Self-pay

## 2016-03-29 ENCOUNTER — Encounter (HOSPITAL_COMMUNITY): Payer: Self-pay | Admitting: *Deleted

## 2016-03-29 MED ORDER — SODIUM CHLORIDE 0.9 % IV SOLN
INTRAVENOUS | Status: DC
  Administered 2016-03-30 (×2): via INTRAVENOUS

## 2016-03-29 MED ORDER — DEXTROSE 5 % IV SOLN
1.5000 g | INTRAVENOUS | Status: AC
  Administered 2016-03-30: 1.5 g via INTRAVENOUS
  Filled 2016-03-29: qty 1.5

## 2016-03-29 NOTE — Progress Notes (Signed)
Pt denies any cardiac history, chest pain or sob. 

## 2016-03-30 ENCOUNTER — Ambulatory Visit (HOSPITAL_COMMUNITY): Payer: 59 | Admitting: Anesthesiology

## 2016-03-30 ENCOUNTER — Other Ambulatory Visit: Payer: Self-pay

## 2016-03-30 ENCOUNTER — Encounter (HOSPITAL_COMMUNITY)
Admission: RE | Disposition: A | Discharge status: Home / Self Care | Payer: Self-pay | Source: Ambulatory Visit | Attending: Surgery

## 2016-03-30 ENCOUNTER — Ambulatory Visit (HOSPITAL_COMMUNITY)
Admission: RE | Admit: 2016-03-30 | Discharge: 2016-03-30 | Disposition: A | Discharge status: Home / Self Care | Payer: 59 | Source: Ambulatory Visit | Attending: Surgery | Admitting: Surgery

## 2016-03-30 ENCOUNTER — Encounter (HOSPITAL_COMMUNITY): Payer: Self-pay | Admitting: *Deleted

## 2016-03-30 DIAGNOSIS — Z859 Personal history of malignant neoplasm, unspecified: Secondary | ICD-10-CM | POA: Insufficient documentation

## 2016-03-30 DIAGNOSIS — M109 Gout, unspecified: Secondary | ICD-10-CM | POA: Insufficient documentation

## 2016-03-30 DIAGNOSIS — N184 Chronic kidney disease, stage 4 (severe): Secondary | ICD-10-CM | POA: Diagnosis not present

## 2016-03-30 DIAGNOSIS — I129 Hypertensive chronic kidney disease with stage 1 through stage 4 chronic kidney disease, or unspecified chronic kidney disease: Secondary | ICD-10-CM | POA: Insufficient documentation

## 2016-03-30 DIAGNOSIS — Z48812 Encounter for surgical aftercare following surgery on the circulatory system: Secondary | ICD-10-CM

## 2016-03-30 DIAGNOSIS — Z79899 Other long term (current) drug therapy: Secondary | ICD-10-CM | POA: Diagnosis not present

## 2016-03-30 DIAGNOSIS — G4730 Sleep apnea, unspecified: Secondary | ICD-10-CM | POA: Diagnosis not present

## 2016-03-30 DIAGNOSIS — K219 Gastro-esophageal reflux disease without esophagitis: Secondary | ICD-10-CM | POA: Diagnosis not present

## 2016-03-30 DIAGNOSIS — N189 Chronic kidney disease, unspecified: Secondary | ICD-10-CM | POA: Diagnosis present

## 2016-03-30 DIAGNOSIS — N186 End stage renal disease: Secondary | ICD-10-CM

## 2016-03-30 HISTORY — PX: AV FISTULA PLACEMENT: SHX1204

## 2016-03-30 HISTORY — DX: Anemia, unspecified: D64.9

## 2016-03-30 LAB — POCT I-STAT 4, (NA,K, GLUC, HGB,HCT)
Glucose, Bld: 87 mg/dL (ref 65–99)
HCT: 30 % — ABNORMAL LOW (ref 39.0–52.0)
HEMOGLOBIN: 10.2 g/dL — AB (ref 13.0–17.0)
POTASSIUM: 5 mmol/L (ref 3.5–5.1)
SODIUM: 136 mmol/L (ref 135–145)

## 2016-03-30 SURGERY — INSERTION OF ARTERIOVENOUS (AV) GORE-TEX GRAFT ARM
Anesthesia: Monitor Anesthesia Care | Site: Arm Upper | Laterality: Left

## 2016-03-30 MED ORDER — PHENYLEPHRINE 40 MCG/ML (10ML) SYRINGE FOR IV PUSH (FOR BLOOD PRESSURE SUPPORT)
PREFILLED_SYRINGE | INTRAVENOUS | Status: AC
  Filled 2016-03-30: qty 10

## 2016-03-30 MED ORDER — OXYCODONE-ACETAMINOPHEN 5-325 MG PO TABS
ORAL_TABLET | ORAL | Status: AC
  Administered 2016-03-30: 2 via ORAL
  Filled 2016-03-30: qty 2

## 2016-03-30 MED ORDER — HYDROMORPHONE HCL 1 MG/ML IJ SOLN
0.2500 mg | INTRAMUSCULAR | Status: DC | PRN
  Administered 2016-03-30 (×2): 0.5 mg via INTRAVENOUS

## 2016-03-30 MED ORDER — CHLORHEXIDINE GLUCONATE CLOTH 2 % EX PADS: 6.0000 | MEDICATED_PAD | Freq: Once | CUTANEOUS | Status: DC

## 2016-03-30 MED ORDER — HYDROMORPHONE HCL 1 MG/ML IJ SOLN
INTRAMUSCULAR | Status: AC
  Administered 2016-03-30: 0.5 mg via INTRAVENOUS
  Filled 2016-03-30: qty 1

## 2016-03-30 MED ORDER — SODIUM CHLORIDE 0.9 % IV SOLN
10.0000 mg | INTRAVENOUS | Status: DC | PRN
  Administered 2016-03-30: 25 ug/min via INTRAVENOUS

## 2016-03-30 MED ORDER — ONDANSETRON HCL 4 MG/2ML IJ SOLN
INTRAMUSCULAR | Status: AC
  Filled 2016-03-30: qty 4

## 2016-03-30 MED ORDER — LIDOCAINE 2% (20 MG/ML) 5 ML SYRINGE
INTRAMUSCULAR | Status: AC
  Filled 2016-03-30: qty 10

## 2016-03-30 MED ORDER — MEPERIDINE HCL 25 MG/ML IJ SOLN: 6.2500 mg | INTRAMUSCULAR | Status: DC | PRN

## 2016-03-30 MED ORDER — OXYCODONE-ACETAMINOPHEN 5-325 MG PO TABS: 1.0000 | ORAL_TABLET | Freq: Four times a day (QID) | ORAL | Status: DC | PRN

## 2016-03-30 MED ORDER — ONDANSETRON HCL 4 MG/2ML IJ SOLN
INTRAMUSCULAR | Status: DC | PRN
  Administered 2016-03-30: 4 mg via INTRAVENOUS

## 2016-03-30 MED ORDER — MIDAZOLAM HCL 5 MG/5ML IJ SOLN
INTRAMUSCULAR | Status: DC | PRN
  Administered 2016-03-30: 2 mg via INTRAVENOUS

## 2016-03-30 MED ORDER — PROMETHAZINE HCL 25 MG/ML IJ SOLN: 6.2500 mg | INTRAMUSCULAR | Status: DC | PRN

## 2016-03-30 MED ORDER — LIDOCAINE HCL (PF) 1 % IJ SOLN
INTRAMUSCULAR | Status: AC
  Filled 2016-03-30: qty 30

## 2016-03-30 MED ORDER — OXYCODONE-ACETAMINOPHEN 5-325 MG PO TABS
1.0000 | ORAL_TABLET | Freq: Once | ORAL | Status: AC
  Administered 2016-03-30: 2 via ORAL

## 2016-03-30 MED ORDER — EPHEDRINE 5 MG/ML INJ
INTRAVENOUS | Status: AC
  Filled 2016-03-30: qty 10

## 2016-03-30 MED ORDER — LIDOCAINE HCL (CARDIAC) 20 MG/ML IV SOLN
INTRAVENOUS | Status: DC | PRN
  Administered 2016-03-30: 50 mg via INTRAVENOUS

## 2016-03-30 MED ORDER — LIDOCAINE-EPINEPHRINE (PF) 1 %-1:200000 IJ SOLN
INTRAMUSCULAR | Status: DC | PRN
  Administered 2016-03-30: 30 mL

## 2016-03-30 MED ORDER — EPHEDRINE SULFATE 50 MG/ML IJ SOLN
INTRAMUSCULAR | Status: DC | PRN
  Administered 2016-03-30 (×3): 10 mg via INTRAVENOUS

## 2016-03-30 MED ORDER — SODIUM CHLORIDE 0.9 % IV SOLN
INTRAVENOUS | Status: DC | PRN
  Administered 2016-03-30: 12:00:00

## 2016-03-30 MED ORDER — FENTANYL CITRATE (PF) 100 MCG/2ML IJ SOLN
INTRAMUSCULAR | Status: DC | PRN
  Administered 2016-03-30: 100 ug via INTRAVENOUS

## 2016-03-30 MED ORDER — MIDAZOLAM HCL 2 MG/2ML IJ SOLN
INTRAMUSCULAR | Status: AC
  Filled 2016-03-30: qty 2

## 2016-03-30 MED ORDER — PROPOFOL 10 MG/ML IV BOLUS
INTRAVENOUS | Status: DC | PRN
  Administered 2016-03-30: 200 mg via INTRAVENOUS

## 2016-03-30 MED ORDER — PHENYLEPHRINE HCL 10 MG/ML IJ SOLN
INTRAMUSCULAR | Status: DC | PRN
  Administered 2016-03-30 (×2): 80 ug via INTRAVENOUS

## 2016-03-30 MED ORDER — PROTAMINE SULFATE 10 MG/ML IV SOLN
INTRAVENOUS | Status: DC | PRN
  Administered 2016-03-30: 25 mg via INTRAVENOUS

## 2016-03-30 MED ORDER — HEPARIN SODIUM (PORCINE) 1000 UNIT/ML IJ SOLN
INTRAMUSCULAR | Status: DC | PRN
  Administered 2016-03-30: 3000 [IU] via INTRAVENOUS

## 2016-03-30 MED ORDER — LIDOCAINE-EPINEPHRINE (PF) 1 %-1:200000 IJ SOLN
INTRAMUSCULAR | Status: AC
  Filled 2016-03-30: qty 30

## 2016-03-30 MED ORDER — 0.9 % SODIUM CHLORIDE (POUR BTL) OPTIME
TOPICAL | Status: DC | PRN
  Administered 2016-03-30: 1000 mL

## 2016-03-30 MED ORDER — FENTANYL CITRATE (PF) 250 MCG/5ML IJ SOLN
INTRAMUSCULAR | Status: AC
  Filled 2016-03-30: qty 5

## 2016-03-30 SURGICAL SUPPLY — 36 items
ARMBAND PINK RESTRICT EXTREMIT (MISCELLANEOUS) ×2 IMPLANT
CANISTER SUCTION 2500CC (MISCELLANEOUS) ×2 IMPLANT
CLIP TI MEDIUM 6 (CLIP) ×2 IMPLANT
CLIP TI WIDE RED SMALL 6 (CLIP) ×2 IMPLANT
COVER PROBE W GEL 5X96 (DRAPES) ×1 IMPLANT
ELECT REM PT RETURN 9FT ADLT (ELECTROSURGICAL) ×2
ELECTRODE REM PT RTRN 9FT ADLT (ELECTROSURGICAL) ×1 IMPLANT
GEL ULTRASOUND 20GR AQUASONIC (MISCELLANEOUS) IMPLANT
GLOVE BIO SURGEON STRL SZ 6.5 (GLOVE) ×2 IMPLANT
GLOVE BIOGEL PI IND STRL 6.5 (GLOVE) IMPLANT
GLOVE BIOGEL PI IND STRL 7.0 (GLOVE) IMPLANT
GLOVE BIOGEL PI IND STRL 7.5 (GLOVE) ×1 IMPLANT
GLOVE BIOGEL PI IND STRL 8 (GLOVE) IMPLANT
GLOVE BIOGEL PI INDICATOR 6.5 (GLOVE) ×1
GLOVE BIOGEL PI INDICATOR 7.0 (GLOVE) ×1
GLOVE BIOGEL PI INDICATOR 7.5 (GLOVE) ×1
GLOVE BIOGEL PI INDICATOR 8 (GLOVE) ×1
GLOVE ECLIPSE 6.5 STRL STRAW (GLOVE) ×1 IMPLANT
GLOVE SURG SS PI 7.5 STRL IVOR (GLOVE) ×2 IMPLANT
GOWN STRL REUS W/ TWL LRG LVL3 (GOWN DISPOSABLE) ×2 IMPLANT
GOWN STRL REUS W/ TWL XL LVL3 (GOWN DISPOSABLE) ×1 IMPLANT
GOWN STRL REUS W/TWL LRG LVL3 (GOWN DISPOSABLE) ×4
GOWN STRL REUS W/TWL XL LVL3 (GOWN DISPOSABLE) ×2
HEMOSTAT SNOW SURGICEL 2X4 (HEMOSTASIS) IMPLANT
KIT BASIN OR (CUSTOM PROCEDURE TRAY) ×2 IMPLANT
KIT ROOM TURNOVER OR (KITS) ×2 IMPLANT
LIQUID BAND (GAUZE/BANDAGES/DRESSINGS) ×2 IMPLANT
NS IRRIG 1000ML POUR BTL (IV SOLUTION) ×2 IMPLANT
PACK CV ACCESS (CUSTOM PROCEDURE TRAY) ×2 IMPLANT
PAD ARMBOARD 7.5X6 YLW CONV (MISCELLANEOUS) ×4 IMPLANT
SUT PROLENE 6 0 BV (SUTURE) ×4 IMPLANT
SUT VIC AB 3-0 SH 27 (SUTURE) ×4
SUT VIC AB 3-0 SH 27X BRD (SUTURE) ×2 IMPLANT
SUT VICRYL 4-0 PS2 18IN ABS (SUTURE) IMPLANT
UNDERPAD 30X30 INCONTINENT (UNDERPADS AND DIAPERS) ×2 IMPLANT
WATER STERILE IRR 1000ML POUR (IV SOLUTION) ×2 IMPLANT

## 2016-03-30 NOTE — Interval H&P Note (Signed)
History and Physical Interval Note:  03/30/2016 10:40 AM  Phillip Blair  has presented today for surgery, with the diagnosis of Stage IV Chronic Kidney Disease N18.4  The various methods of treatment have been discussed with the patient and family. After consideration of risks, benefits and other options for treatment, the patient has consented to  Procedure(s): INSERTION OF ARTERIOVENOUS (AV) GORE-TEX GRAFT ARM VERSUS FIRST STAGE BASILIC VEIN TRANSPOSITION (Left) as a surgical intervention .  The patient's history has been reviewed, patient examined, no change in status, stable for surgery.  I have reviewed the patient's chart and labs.  Questions were answered to the patient's satisfaction.     Annamarie Major

## 2016-03-30 NOTE — CV Procedure (Signed)
    Patient name: Phillip Blair MRN: [id number] DOB: Mar 15, 1956 Sex: male  03/30/2016 Pre-operative Diagnosis: Chronic renal insufficiency Post-operative diagnosis:  Same Surgeon:  Annamarie Major Assistants:  Leontine Locket Procedure:   First stage left basilic vein transposition Anesthesia:  Gen. Blood Loss:  See anesthesia record Specimens:  None excellent appearing basilic vein measuring 4 mm.  Findings:   excellent appearing basilic vein measuring 4 mm.  Indications:  The patient is not yet on dialysis.  His initial vein mapping showed inadequate veins bilaterally and the plan was for graft.  He comes in today for graft placement  Procedure:  The patient was identified in the holding area and taken to [place] [address]  The patient was then placed supine on the table. general anesthesia was administered.  The patient was prepped and draped in the usual sterile fashion.  A time out was called and antibiotics were administered.  I evaluated the cephalic and basilic vein with ultrasound.  His basilic vein actually appeared much better than it did on his previous vein mapping studies.  It measured approximately 3-4 millimeters.  I felt it was adequate for basilic vein fistula creation.  A transverse incision was then made at the antecubital crease.  I dissected out the brachial artery.  This was disease-free and a 3 mm artery.  I then dissected of the basilic vein.  It measured at least 3 mm.  Side branches were ligated.  The vein was marked with a pen for orientation.  The patient was given 3000 units of heparin.  The vein was then ligated distally and with a 2-0 silk tie.  The vein distended nicely with heparin saline.  Next the artery was occluded with vascular clamps.  A #11 blade was used to make an arteriotomy which was extended longitudinally with Potts scissors.  The vein was spatulated and sewn end-to-side to the brachial artery with a running 6-0 Prolene.  Prior to completion, the periphery  flushing maneuvers were performed the anastomosis was completed.  There was an excellent thrill within the fistula.  The patient had a biphasic radial artery which did improve with fistula compression.  There appeared to be adequate perfusion to the hand.  25 mg of protamine was given.  The incision was closed with 2 layers of 3-0 Vicryl followed by Dermabond.  There were no immediate complications.   Disposition:  To PACU in stable condition.   Theotis Burrow, M.D. [facility] [place] Office: [phone] Pager:  [phone]

## 2016-03-30 NOTE — Transfer of Care (Signed)
Immediate Anesthesia Transfer of Care Note  Patient: Phillip Blair  Procedure(s) Performed: Procedure(s): FIRST STAGE BASILIC VEIN TRANSPOSITION LEFT UPPER ARM (Left)  Patient Location: PACU  Anesthesia Type:General  Level of Consciousness: awake, alert  and oriented  Airway & Oxygen Therapy: Patient Spontanous Breathing and Patient connected to nasal cannula oxygen  Post-op Assessment: Report given to RN, Post -op Vital signs reviewed and stable and Patient moving all extremities X 4  Post vital signs: Reviewed and stable  Last Vitals:  Filed Vitals:   03/30/16 1328 03/30/16 1330  BP: 126/76   Pulse: 92 92  Temp: 36.4 C   Resp: 16 16    Last Pain: There were no vitals filed for this visit.    Patients Stated Pain Goal: 3 ([id number])  Complications: No apparent anesthesia complications

## 2016-03-30 NOTE — Anesthesia Preprocedure Evaluation (Addendum)
Anesthesia Evaluation  Patient identified by MRN, date of birth, ID band Patient awake    Reviewed: Allergy & Precautions, H&P , NPO status , Patient's Chart, lab work & pertinent test results  Airway Mallampati: II  TM Distance: >3 FB Neck ROM: full    Dental no notable dental hx. (+) Teeth Intact, Dental Advisory Given   Pulmonary sleep apnea and Continuous Positive Airway Pressure Ventilation ,    Pulmonary exam normal breath sounds clear to auscultation       Cardiovascular Exercise Tolerance: Good hypertension, Pt. on medications  Rhythm:regular Rate:Normal     Neuro/Psych negative neurological ROS  negative psych ROS   GI/Hepatic negative GI ROS, Neg liver ROS, GERD  ,  Endo/Other  negative endocrine ROSParathyroid adenoma with hypercalcemia  Renal/GU Renal Insufficiency, ESRF and DialysisRenal diseaseCRT 1.91  negative genitourinary   Musculoskeletal   Abdominal   Peds  Hematology negative hematology ROS (+)   Anesthesia Other Findings   Reproductive/Obstetrics negative OB ROS                           Anesthesia Physical  Anesthesia Plan  ASA: III  Anesthesia Plan: MAC   Post-op Pain Management:    Induction: Intravenous  Airway Management Planned:   Additional Equipment:   Intra-op Plan:   Post-operative Plan:   Informed Consent: I have reviewed the patients History and Physical, chart, labs and discussed the procedure including the risks, benefits and alternatives for the proposed anesthesia with the patient or authorized representative who has indicated his/her understanding and acceptance.   Dental advisory given and Dental Advisory Given  Plan Discussed with: CRNA  Anesthesia Plan Comments:        Anesthesia Quick Evaluation

## 2016-03-30 NOTE — H&P (View-Only) (Signed)
[facility] of [place]  Patient name: BLYTHE CLOUGH MRN: [id number] DOB: December 09, 1955 Sex: male  REASON FOR VISIT: Follow-up  HPI: Phillip Blair is a 60 y.o. male with chronic renal insufficiency who I evaluated for dialysis access/year and felt that he had very small veins and might need a graft.  He is right-handed.  He comes back in today as his kidney function has deteriorated.  He wants to discuss additional access options.  He is still very interested in peritoneal dialysis.  Past Medical History  Diagnosis Date  . Allergy   . Hyperlipidemia   . Gout   . Sleep apnea     uses CPAP  . Hypertension     does not see a cardiologist  . Parathyroid adenoma   . Hypercalcemia   . GERD (gastroesophageal reflux disease)     occ  . Chronic kidney disease     denies  . Cancer [facility])     No family history on file.  SOCIAL HISTORY: Social History  Substance Use Topics  . Smoking status: Never Smoker   . Smokeless tobacco: Never Used  . Alcohol Use: No    Allergies  Allergen Reactions  . Ivp Dye [Iodinated Diagnostic Agents] Itching    Then peeling of skin    Current Outpatient Prescriptions  Medication Sig Dispense Refill  . allopurinol (ZYLOPRIM) 300 MG tablet Take 100 mg by mouth daily.     Marland Kitchen amLODipine (NORVASC) 10 MG tablet Take 10 mg by mouth every morning.     . furosemide (LASIX) 40 MG tablet Take 40 mg by mouth 2 (two) times daily.  5  . sodium bicarbonate 650 MG tablet Take 650 mg by mouth 2 (two) times daily.  6   No current facility-administered medications for this visit.    REVIEW OF SYSTEMS:  [X]  denotes positive finding, [ ]  denotes negative finding Cardiac  Comments:  Chest pain or chest pressure:    Shortness of breath upon exertion:    Short of breath when lying flat:    Irregular heart rhythm:        Vascular    Pain in calf, thigh, or hip brought on by ambulation:    Pain in feet  at night that wakes you up from your sleep:     Blood clot in your veins:    Leg swelling:         Pulmonary    Oxygen at home:    Productive cough:     Wheezing:         Neurologic    Sudden weakness in arms or legs:     Sudden numbness in arms or legs:     Sudden onset of difficulty speaking or slurred speech:    Temporary loss of vision in one eye:     Problems with dizziness:         Gastrointestinal    Blood in stool:     Vomited blood:         Genitourinary    Burning when urinating:     Blood in urine:        Psychiatric    Major depression:         Hematologic    Bleeding problems:    Problems with blood clotting too easily:        Skin    Rashes or ulcers:        Constitutional    Fever or chills:  PHYSICAL EXAM: Filed Vitals:   03/05/16 1142  BP: 117/77  Pulse: 48  Temp: 97.4 F (36.3 C)  TempSrc: Oral  Resp: 16  Height: 6' (1.829 m)  Weight: 176 lb (79.833 kg)  SpO2: 100%    GENERAL: The patient is a well-nourished male, in no acute distress. The vital signs are documented above. CARDIAC: There is a regular rate and rhythm.  VASCULAR: Palpable radial and brachial pulse PULMONARY: There is good air exchange bilaterally without wheezing or rales. MUSCULOSKELETAL: There are no major deformities or cyanosis. NEUROLOGIC: No focal weakness or paresthesias are detected. SKIN: There are no ulcers or rashes noted. PSYCHIATRIC: The patient has a normal affect.  DATA:  I have again reviewed his vein mapping which shows his left basilic vein measures [id number] at the antecubital crease and 0.19 at the shoulder  MEDICAL ISSUES: Chronic renal insufficiency:  I discussed with the patient that I would schedule him for a left forearm AVG G versus a first stage left basilic vein transposition.  I will evaluate his basilic vein in the operating room and if I feel that it is adequate for an attempt at fistula creation, I would preferentially use his basilic  vein.  If not I would place a graft.  He is interested in peritoneal dialysis and would like to further discuss this with Dr. [name].  He will contact me to schedule his surgery date.    Annamarie Major, MD [facility] [phone] Pager [phone]

## 2016-03-30 NOTE — Anesthesia Procedure Notes (Signed)
Procedure Name: LMA Insertion Date/Time: 03/30/2016 12:09 PM Performed by: Neldon Newport Pre-anesthesia Checklist: Timeout performed, Patient being monitored, Suction available, Emergency Drugs available and Patient identified Patient Re-evaluated:Patient Re-evaluated prior to inductionOxygen Delivery Method: Circle system utilized Preoxygenation: Pre-oxygenation with 100% oxygen Intubation Type: IV induction Ventilation: Mask ventilation without difficulty LMA: LMA inserted LMA Size: 5.0 Number of attempts: 1 Placement Confirmation: breath sounds checked- equal and bilateral,  positive ETCO2 and ETT inserted through vocal cords under direct vision Tube secured with: Tape Dental Injury: Teeth and Oropharynx as per pre-operative assessment

## 2016-03-30 NOTE — Discharge Instructions (Signed)
° ° °  03/30/2016 TARRENCE ROMICK [id number] 1955-09-29  Surgeon(s): Serafina Mitchell, MD  Procedure(s): Left first stage basilic vein transposition.  x Do not stick fistula for 12 weeks

## 2016-04-02 ENCOUNTER — Encounter (HOSPITAL_COMMUNITY): Payer: Self-pay | Admitting: Surgery

## 2016-04-02 NOTE — Anesthesia Postprocedure Evaluation (Signed)
Anesthesia Post Note  Patient: Phillip Blair  Procedure(s) Performed: Procedure(s) (LRB): FIRST STAGE BASILIC VEIN TRANSPOSITION LEFT UPPER ARM (Left)  Patient location during evaluation: PACU Anesthesia Type: General Level of consciousness: awake and alert Pain management: pain level controlled Vital Signs Assessment: post-procedure vital signs reviewed and stable Respiratory status: spontaneous breathing, nonlabored ventilation, respiratory function stable and patient connected to nasal cannula oxygen Cardiovascular status: blood pressure returned to baseline and stable Postop Assessment: no signs of nausea or vomiting Anesthetic complications: no     Last Vitals:  Filed Vitals:   03/30/16 1415 03/30/16 1430  BP: 113/63 118/68  Pulse: 75 73  Temp: 36.7 C   Resp: 13 15    Last Pain:  Filed Vitals:   03/30/16 1439  PainSc: 3    Pain Goal: Patients Stated Pain Goal: 3 (03/30/16 0928)               Montez Hageman

## 2016-04-03 ENCOUNTER — Telehealth: Payer: Self-pay | Admitting: Surgery

## 2016-04-03 NOTE — Telephone Encounter (Signed)
Left message on patient's home phone to call us to schedule follow up appointment.

## 2016-04-03 NOTE — Telephone Encounter (Signed)
-----   Message from Denman George, RN sent at 03/30/2016  3:36 PM EDT ----- Regarding: needs 4-6 wk. f/u and access duplex with Dr. Trula Slade   ----- Message -----    From: Gabriel Earing, PA-C    Sent: 03/30/2016   1:26 PM      To: Vvs Charge Pool  S/p left 1st stage BVT 03/30/16.  F/u with Dr. Trula Slade in 4-6 weeks with duplex.

## 2016-04-04 ENCOUNTER — Telehealth: Payer: Self-pay | Admitting: Surgery

## 2016-04-04 NOTE — Telephone Encounter (Signed)
-----   Message from Denman George, RN sent at 03/30/2016  3:36 PM EDT ----- Regarding: needs 4-6 wk. f/u and access duplex with Dr. Trula Slade   ----- Message -----    From: Gabriel Earing, PA-C    Sent: 03/30/2016   1:26 PM      To: Vvs Charge Pool  S/p left 1st stage BVT 03/30/16.  F/u with Dr. Trula Slade in 4-6 weeks with duplex.

## 2016-04-04 NOTE — Telephone Encounter (Signed)
Sched appt 8/28; lab at 11:30 and MD 12:15. Spoke to pt to inform him of appt.

## 2016-05-14 ENCOUNTER — Encounter: Payer: 59 | Admitting: Surgery

## 2016-05-15 ENCOUNTER — Encounter: Payer: Self-pay | Admitting: Surgery

## 2016-05-16 ENCOUNTER — Encounter: Payer: Self-pay | Admitting: Surgery

## 2016-05-16 ENCOUNTER — Ambulatory Visit (INDEPENDENT_AMBULATORY_CARE_PROVIDER_SITE_OTHER): Payer: 59 | Admitting: Surgery

## 2016-05-16 VITALS — BP 114/72 | HR 66 | Temp 97.8°F | Ht 72.0 in | Wt 176.5 lb

## 2016-05-16 DIAGNOSIS — T8130XA Disruption of wound, unspecified, initial encounter: Secondary | ICD-10-CM | POA: Insufficient documentation

## 2016-05-16 DIAGNOSIS — N185 Chronic kidney disease, stage 5: Secondary | ICD-10-CM

## 2016-05-16 NOTE — Progress Notes (Signed)
  POST OPERATIVE OFFICE NOTE    CC:  F/u for surgery  HPI:  This is a 60 y.o. male who is s/p Left AV fistula with basilic vein first stage 03/30/2016.  He had some drainage from the incision and was concerned.  No fever or chills.  Allergies  Allergen Reactions  . Ivp Dye [Iodinated Diagnostic Agents] Itching and Other (See Comments)    Peeling of skin    Current Outpatient Prescriptions  Medication Sig Dispense Refill  . allopurinol (ZYLOPRIM) 100 MG tablet Take 100 mg by mouth daily.    Marland Kitchen amLODipine (NORVASC) 10 MG tablet Take 10 mg by mouth every morning.     . Azelastine HCl 0.15 % SOLN Place 2 Squirts into the nose 2 (two) times daily.    . furosemide (LASIX) 40 MG tablet Take 40 mg by mouth 2 (two) times daily.  5  . sodium bicarbonate 650 MG tablet Take 650 mg by mouth 2 (two) times daily.  6  . MUCINEX 600 MG 12 hr tablet Take 1,200 mg by mouth 2 (two) times daily as needed for cough.   0  . oxyCODONE-acetaminophen (ROXICET) 5-325 MG tablet Take 1 tablet by mouth every 6 (six) hours as needed for severe pain. (Patient not taking: Reported on 05/16/2016) 8 tablet 0   No current facility-administered medications for this visit.      ROS:  See HPI  Physical Exam:  Vitals:   05/16/16 1415  BP: 114/72  Pulse: 66  Temp: 97.8 F (36.6 C)    Incision:  Well healing with vicryl stitch abscess.  No drainage, edema or erythema. Palpable thrill and radial pulse.  Sensation intact and equal bilateral UE.   Assessment/Plan:  This is a 60 y.o. male who is s/p: S/P left AV fistula with basilic vein.  He will f/u with Dr. Venia Carbon. 05/21/2016 for fistula duplex and transposition surgery planning.       Laurence Slate [facility]  PA-C [facility] [phone]

## 2016-05-18 ENCOUNTER — Ambulatory Visit (HOSPITAL_COMMUNITY)
Admission: RE | Admit: 2016-05-18 | Discharge: 2016-05-18 | Disposition: A | Discharge status: Home / Self Care | Payer: 59 | Source: Ambulatory Visit | Attending: Nephrology | Admitting: Nephrology

## 2016-05-18 DIAGNOSIS — Z5181 Encounter for therapeutic drug level monitoring: Secondary | ICD-10-CM | POA: Insufficient documentation

## 2016-05-18 DIAGNOSIS — D631 Anemia in chronic kidney disease: Secondary | ICD-10-CM | POA: Diagnosis not present

## 2016-05-18 DIAGNOSIS — N183 Chronic kidney disease, stage 3 (moderate): Secondary | ICD-10-CM | POA: Diagnosis present

## 2016-05-18 DIAGNOSIS — N185 Chronic kidney disease, stage 5: Secondary | ICD-10-CM

## 2016-05-18 DIAGNOSIS — Z79899 Other long term (current) drug therapy: Secondary | ICD-10-CM | POA: Insufficient documentation

## 2016-05-18 LAB — POCT HEMOGLOBIN-HEMACUE: Hemoglobin: 9.4 g/dL — ABNORMAL LOW (ref 13.0–17.0)

## 2016-05-18 MED ORDER — EPOETIN ALFA 10000 UNIT/ML IJ SOLN
INTRAMUSCULAR | Status: AC
  Filled 2016-05-18: qty 1

## 2016-05-18 MED ORDER — EPOETIN ALFA 10000 UNIT/ML IJ SOLN
10000.0000 [IU] | INTRAMUSCULAR | Status: DC
  Administered 2016-05-18: 10000 [IU] via SUBCUTANEOUS

## 2016-05-18 NOTE — Discharge Instructions (Signed)
Epoetin Alfa injection What is this medicine? EPOETIN ALFA (e POE e tin AL fa) helps your body make more red blood cells. This medicine is used to treat anemia caused by chronic kidney failure, cancer chemotherapy, or HIV-therapy. It may also be used before surgery if you have anemia. This medicine may be used for other purposes; ask your health care provider or pharmacist if you have questions. What should I tell my health care provider before I take this medicine? They need to know if you have any of these conditions: -blood clotting disorders -cancer patient not on chemotherapy -cystic fibrosis -heart disease, such as angina or heart failure -hemoglobin level of 12 g/dL or greater -high blood pressure -low levels of folate, iron, or vitamin B12 -seizures -an unusual or allergic reaction to erythropoietin, albumin, benzyl alcohol, hamster proteins, other medicines, foods, dyes, or preservatives -pregnant or trying to get pregnant -breast-feeding How should I use this medicine? This medicine is for injection into a vein or under the skin. It is usually given by a health care professional in a hospital or clinic setting. If you get this medicine at home, you will be taught how to prepare and give this medicine. Use exactly as directed. Take your medicine at regular intervals. Do not take your medicine more often than directed. It is important that you put your used needles and syringes in a special sharps container. Do not put them in a trash can. If you do not have a sharps container, call your pharmacist or healthcare provider to get one. Talk to your pediatrician regarding the use of this medicine in children. While this drug may be prescribed for selected conditions, precautions do apply. Overdosage: If you think you have taken too much of this medicine contact a poison control center or emergency room at once. NOTE: This medicine is only for you. Do not share this medicine with  others. What if I miss a dose? If you miss a dose, take it as soon as you can. If it is almost time for your next dose, take only that dose. Do not take double or extra doses. What may interact with this medicine? Do not take this medicine with any of the following medications: -darbepoetin alfa This list may not describe all possible interactions. Give your health care provider a list of all the medicines, herbs, non-prescription drugs, or dietary supplements you use. Also tell them if you smoke, drink alcohol, or use illegal drugs. Some items may interact with your medicine. What should I watch for while using this medicine? Visit your prescriber or health care professional for regular checks on your progress and for the needed blood tests and blood pressure measurements. It is especially important for the doctor to make sure your hemoglobin level is in the desired range, to limit the risk of potential side effects and to give you the best benefit. Keep all appointments for any recommended tests. Check your blood pressure as directed. Ask your doctor what your blood pressure should be and when you should contact him or her. As your body makes more red blood cells, you may need to take iron, folic acid, or vitamin B supplements. Ask your doctor or health care provider which products are right for you. If you have kidney disease continue dietary restrictions, even though this medication can make you feel better. Talk with your doctor or health care professional about the foods you eat and the vitamins that you take. What side effects may I notice   from receiving this medicine? Side effects that you should report to your doctor or health care professional as soon as possible: -allergic reactions like skin rash, itching or hives, swelling of the face, lips, or tongue -breathing problems -changes in vision -chest pain -confusion, trouble speaking or understanding -feeling faint or lightheaded,  falls -high blood pressure -muscle aches or pains -pain, swelling, warmth in the leg -rapid weight gain -severe headaches -sudden numbness or weakness of the face, arm or leg -trouble walking, dizziness, loss of balance or coordination -seizures (convulsions) -swelling of the ankles, feet, hands -unusually weak or tired Side effects that usually do not require medical attention (report to your doctor or health care professional if they continue or are bothersome): -diarrhea -fever, chills (flu-like symptoms) -headaches -nausea, vomiting -redness, stinging, or swelling at site where injected This list may not describe all possible side effects. Call your doctor for medical advice about side effects. You may report side effects to FDA at 1-800-FDA-1088. Where should I keep my medicine? Keep out of the reach of children. Store in a refrigerator between 2 and 8 degrees C (36 and 46 degrees F). Do not freeze or shake. Throw away any unused portion if using a single-dose vial. Multi-dose vials can be kept in the refrigerator for up to 21 days after the initial dose. Throw away unused medicine. NOTE: This sheet is a summary. It may not cover all possible information. If you have questions about this medicine, talk to your doctor, pharmacist, or health care provider.    2016, Elsevier/Gold Standard. (2008-08-24 10:25:44)  

## 2016-05-21 ENCOUNTER — Encounter: Payer: Self-pay | Admitting: Surgery

## 2016-05-21 ENCOUNTER — Ambulatory Visit (HOSPITAL_COMMUNITY)
Admission: RE | Admit: 2016-05-21 | Discharge: 2016-05-21 | Disposition: A | Discharge status: Home / Self Care | Payer: 59 | Source: Ambulatory Visit | Attending: Surgery | Admitting: Surgery

## 2016-05-21 ENCOUNTER — Ambulatory Visit (INDEPENDENT_AMBULATORY_CARE_PROVIDER_SITE_OTHER): Payer: Self-pay | Admitting: Surgery

## 2016-05-21 VITALS — BP 111/71 | HR 55 | Temp 96.9°F | Resp 20 | Ht 72.0 in | Wt 179.2 lb

## 2016-05-21 DIAGNOSIS — N185 Chronic kidney disease, stage 5: Secondary | ICD-10-CM

## 2016-05-21 DIAGNOSIS — N186 End stage renal disease: Secondary | ICD-10-CM | POA: Diagnosis not present

## 2016-05-21 DIAGNOSIS — Z48812 Encounter for surgical aftercare following surgery on the circulatory system: Secondary | ICD-10-CM | POA: Insufficient documentation

## 2016-05-21 DIAGNOSIS — N184 Chronic kidney disease, stage 4 (severe): Secondary | ICD-10-CM

## 2016-05-21 NOTE — Progress Notes (Signed)
   Patient name: Phillip Blair MRN: [id number] DOB: 04/20/1956 Sex: male  REASON FOR VISIT: Postop  HPI: Phillip Blair is a 60 y.o. male who returns today for follow-up.  He is status post left first stage basilic vein transposition on 03/30/2016.  He has no complaints today.  He has no evidence of steal.  Current Outpatient Prescriptions  Medication Sig Dispense Refill  . allopurinol (ZYLOPRIM) 100 MG tablet Take 100 mg by mouth daily.    . amLODipine (NORVASC) 10 MG tablet Take 10 mg by mouth every morning.     . Azelastine HCl 0.15 % SOLN Place 2 Squirts into the nose 2 (two) times daily.    . furosemide (LASIX) 40 MG tablet Take 40 mg by mouth 2 (two) times daily.  5  . MUCINEX 600 MG 12 hr tablet Take 1,200 mg by mouth 2 (two) times daily as needed for cough.   0  . oxyCODONE-acetaminophen (ROXICET) 5-325 MG tablet Take 1 tablet by mouth every 6 (six) hours as needed for severe pain. 8 tablet 0  . sodium bicarbonate 650 MG tablet Take 650 mg by mouth 2 (two) times daily.  6   No current facility-administered medications for this visit.     REVIEW OF SYSTEMS:  [X] denotes positive finding, [ ] denotes negative finding Cardiac  Comments:  Chest pain or chest pressure:    Shortness of breath upon exertion:    Short of breath when lying flat:    Irregular heart rhythm:    Constitutional    Fever or chills:      PHYSICAL EXAM: Vitals:   05/21/16 1300  BP: 111/71  Pulse: (!) 55  Resp: 20  Temp: (!) 96.9 F (36.1 C)  TempSrc: Oral  Weight: 179 lb 3.2 oz (81.3 kg)  Height: 6' (1.829 m)    GENERAL: The patient is a well-nourished male, in no acute distress. The vital signs are documented above. CARDIOVASCULAR: There is a regular rate and rhythm. PULMONARY: There is good air exchange bilaterally without wheezing or rales. Easily palpable thrill within left basilic vein fistula.  Ultrasound of the vein was performed today.  This  shows an excellent diameter vein with most measurements greater than 7 mm.  MEDICAL ISSUES: Patient will be scheduled for a second stage basilic vein transposition on Wednesday, July 20.  The details of procedure discussed with patient.  He understands and wishes to proceed.  Wells Machell Wirthlin, MD [facility] Tel [phone] Pager [phone]    

## 2016-05-22 ENCOUNTER — Encounter (HOSPITAL_COMMUNITY): Payer: 59

## 2016-05-23 ENCOUNTER — Encounter (HOSPITAL_COMMUNITY): Payer: 59

## 2016-05-30 ENCOUNTER — Encounter (HOSPITAL_COMMUNITY)
Admission: RE | Admit: 2016-05-30 | Discharge: 2016-05-30 | Disposition: A | Discharge status: Home / Self Care | Payer: 59 | Source: Ambulatory Visit | Attending: Nephrology | Admitting: Nephrology

## 2016-05-30 DIAGNOSIS — N185 Chronic kidney disease, stage 5: Secondary | ICD-10-CM

## 2016-05-30 DIAGNOSIS — N184 Chronic kidney disease, stage 4 (severe): Secondary | ICD-10-CM | POA: Diagnosis present

## 2016-05-30 LAB — POCT HEMOGLOBIN-HEMACUE: HEMOGLOBIN: 9.9 g/dL — AB (ref 13.0–17.0)

## 2016-05-30 MED ORDER — EPOETIN ALFA 10000 UNIT/ML IJ SOLN
10000.0000 [IU] | INTRAMUSCULAR | Status: DC
  Administered 2016-05-30: 10000 [IU] via SUBCUTANEOUS

## 2016-05-30 MED ORDER — EPOETIN ALFA 10000 UNIT/ML IJ SOLN
INTRAMUSCULAR | Status: AC
  Filled 2016-05-30: qty 1

## 2016-06-01 ENCOUNTER — Other Ambulatory Visit: Payer: Self-pay | Admitting: *Deleted

## 2016-06-12 ENCOUNTER — Encounter (HOSPITAL_COMMUNITY): Payer: Self-pay | Admitting: *Deleted

## 2016-06-12 MED ORDER — DEXTROSE 5 % IV SOLN
1.5000 g | INTRAVENOUS | Status: AC
  Administered 2016-06-13: 1.5 g via INTRAVENOUS
  Filled 2016-06-12: qty 1.5

## 2016-06-13 ENCOUNTER — Ambulatory Visit (HOSPITAL_COMMUNITY): Payer: 59 | Admitting: Certified Registered"

## 2016-06-13 ENCOUNTER — Ambulatory Visit (HOSPITAL_COMMUNITY)
Admission: RE | Admit: 2016-06-13 | Discharge: 2016-06-13 | Disposition: A | Discharge status: Home / Self Care | Payer: 59 | Source: Ambulatory Visit | Attending: Nephrology | Admitting: Nephrology

## 2016-06-13 ENCOUNTER — Encounter (HOSPITAL_COMMUNITY): Payer: Self-pay | Admitting: *Deleted

## 2016-06-13 ENCOUNTER — Ambulatory Visit (HOSPITAL_COMMUNITY)
Admission: RE | Admit: 2016-06-13 | Discharge: 2016-06-13 | Disposition: A | Discharge status: Home / Self Care | Payer: 59 | Source: Ambulatory Visit | Attending: Surgery | Admitting: Surgery

## 2016-06-13 ENCOUNTER — Encounter (HOSPITAL_COMMUNITY)
Admission: RE | Disposition: A | Discharge status: Home / Self Care | Payer: Self-pay | Source: Ambulatory Visit | Attending: Surgery

## 2016-06-13 DIAGNOSIS — I120 Hypertensive chronic kidney disease with stage 5 chronic kidney disease or end stage renal disease: Secondary | ICD-10-CM | POA: Insufficient documentation

## 2016-06-13 DIAGNOSIS — G4730 Sleep apnea, unspecified: Secondary | ICD-10-CM | POA: Insufficient documentation

## 2016-06-13 DIAGNOSIS — T82898A Other specified complication of vascular prosthetic devices, implants and grafts, initial encounter: Secondary | ICD-10-CM | POA: Diagnosis not present

## 2016-06-13 DIAGNOSIS — N186 End stage renal disease: Secondary | ICD-10-CM | POA: Insufficient documentation

## 2016-06-13 DIAGNOSIS — N185 Chronic kidney disease, stage 5: Secondary | ICD-10-CM

## 2016-06-13 HISTORY — DX: Family history of disorders of kidney and ureter: Z84.1

## 2016-06-13 HISTORY — PX: BASCILIC VEIN TRANSPOSITION: SHX5742

## 2016-06-13 LAB — POCT I-STAT 4, (NA,K, GLUC, HGB,HCT)
GLUCOSE: 80 mg/dL (ref 65–99)
HCT: 34 % — ABNORMAL LOW (ref 39.0–52.0)
HEMOGLOBIN: 11.6 g/dL — AB (ref 13.0–17.0)
POTASSIUM: 5.7 mmol/L — AB (ref 3.5–5.1)
Sodium: 138 mmol/L (ref 135–145)

## 2016-06-13 SURGERY — TRANSPOSITION, VEIN, BASILIC
Anesthesia: General | Site: Arm Upper | Laterality: Left

## 2016-06-13 MED ORDER — FENTANYL CITRATE (PF) 250 MCG/5ML IJ SOLN
INTRAMUSCULAR | Status: AC
  Filled 2016-06-13: qty 5

## 2016-06-13 MED ORDER — EPHEDRINE SULFATE 50 MG/ML IJ SOLN
INTRAMUSCULAR | Status: DC | PRN
  Administered 2016-06-13 (×6): 10 mg via INTRAVENOUS

## 2016-06-13 MED ORDER — MIDAZOLAM HCL 5 MG/5ML IJ SOLN
INTRAMUSCULAR | Status: DC | PRN
  Administered 2016-06-13: 2 mg via INTRAVENOUS

## 2016-06-13 MED ORDER — OXYCODONE-ACETAMINOPHEN 5-325 MG PO TABS: 1.0000 | ORAL_TABLET | Freq: Four times a day (QID) | ORAL | 0 refills | Status: DC | PRN

## 2016-06-13 MED ORDER — CHLORHEXIDINE GLUCONATE CLOTH 2 % EX PADS: 6.0000 | MEDICATED_PAD | Freq: Once | CUTANEOUS | Status: DC

## 2016-06-13 MED ORDER — MIDAZOLAM HCL 2 MG/2ML IJ SOLN
INTRAMUSCULAR | Status: AC
  Filled 2016-06-13: qty 2

## 2016-06-13 MED ORDER — LIDOCAINE 2% (20 MG/ML) 5 ML SYRINGE
INTRAMUSCULAR | Status: AC
Start: 2016-06-13 — End: 2016-06-13
  Filled 2016-06-13: qty 5

## 2016-06-13 MED ORDER — EPOETIN ALFA 10000 UNIT/ML IJ SOLN
INTRAMUSCULAR | Status: AC
  Administered 2016-06-13: 10000 [IU] via SUBCUTANEOUS
  Filled 2016-06-13: qty 1

## 2016-06-13 MED ORDER — EPOETIN ALFA 10000 UNIT/ML IJ SOLN
10000.0000 [IU] | INTRAMUSCULAR | Status: DC
  Administered 2016-06-13: 10000 [IU] via SUBCUTANEOUS

## 2016-06-13 MED ORDER — PROPOFOL 10 MG/ML IV BOLUS
INTRAVENOUS | Status: AC
  Filled 2016-06-13: qty 20

## 2016-06-13 MED ORDER — FENTANYL CITRATE (PF) 100 MCG/2ML IJ SOLN
INTRAMUSCULAR | Status: DC | PRN
  Administered 2016-06-13: 50 ug via INTRAVENOUS
  Administered 2016-06-13: 25 ug via INTRAVENOUS
  Administered 2016-06-13: 50 ug via INTRAVENOUS

## 2016-06-13 MED ORDER — 0.9 % SODIUM CHLORIDE (POUR BTL) OPTIME
TOPICAL | Status: DC | PRN
  Administered 2016-06-13: 1000 mL

## 2016-06-13 MED ORDER — SODIUM CHLORIDE 0.9 % IV SOLN
INTRAVENOUS | Status: DC
  Administered 2016-06-13: 11:00:00 via INTRAVENOUS

## 2016-06-13 MED ORDER — LIDOCAINE HCL (PF) 1 % IJ SOLN
INTRAMUSCULAR | Status: AC
  Filled 2016-06-13: qty 30

## 2016-06-13 MED ORDER — PROPOFOL 10 MG/ML IV BOLUS
INTRAVENOUS | Status: DC | PRN
  Administered 2016-06-13: 130 mg via INTRAVENOUS
  Administered 2016-06-13: 70 mg via INTRAVENOUS

## 2016-06-13 MED ORDER — LIDOCAINE 2% (20 MG/ML) 5 ML SYRINGE
INTRAMUSCULAR | Status: DC | PRN
  Administered 2016-06-13: 20 mg via INTRAVENOUS

## 2016-06-13 MED ORDER — HEPARIN SODIUM (PORCINE) 5000 UNIT/ML IJ SOLN
INTRAMUSCULAR | Status: DC | PRN
  Administered 2016-06-13: 500 mL

## 2016-06-13 SURGICAL SUPPLY — 37 items
ARMBAND PINK RESTRICT EXTREMIT (MISCELLANEOUS) ×2 IMPLANT
CANISTER SUCTION 2500CC (MISCELLANEOUS) ×2 IMPLANT
CLIP TI MEDIUM 24 (CLIP) ×1 IMPLANT
CLIP TI MEDIUM 6 (CLIP) IMPLANT
CLIP TI WIDE RED SMALL 24 (CLIP) ×1 IMPLANT
CLIP TI WIDE RED SMALL 6 (CLIP) IMPLANT
COVER PROBE W GEL 5X96 (DRAPES) ×1 IMPLANT
DRAPE ORTHO SPLIT 77X108 STRL (DRAPES) ×2
DRAPE SURG ORHT 6 SPLT 77X108 (DRAPES) IMPLANT
ELECT REM PT RETURN 9FT ADLT (ELECTROSURGICAL) ×2
ELECTRODE REM PT RTRN 9FT ADLT (ELECTROSURGICAL) ×1 IMPLANT
GLOVE BIO SURGEON STRL SZ8 (GLOVE) ×1 IMPLANT
GLOVE BIOGEL PI IND STRL 6.5 (GLOVE) IMPLANT
GLOVE BIOGEL PI IND STRL 7.5 (GLOVE) ×1 IMPLANT
GLOVE BIOGEL PI INDICATOR 6.5 (GLOVE) ×1
GLOVE BIOGEL PI INDICATOR 7.5 (GLOVE) ×2
GLOVE ECLIPSE 7.0 STRL STRAW (GLOVE) ×1 IMPLANT
GLOVE SURG SS PI 6.5 STRL IVOR (GLOVE) ×1 IMPLANT
GLOVE SURG SS PI 7.5 STRL IVOR (GLOVE) ×2 IMPLANT
GOWN STRL REUS W/ TWL LRG LVL3 (GOWN DISPOSABLE) ×2 IMPLANT
GOWN STRL REUS W/ TWL XL LVL3 (GOWN DISPOSABLE) ×1 IMPLANT
GOWN STRL REUS W/TWL LRG LVL3 (GOWN DISPOSABLE) ×4
GOWN STRL REUS W/TWL XL LVL3 (GOWN DISPOSABLE) ×2
HEMOSTAT SNOW SURGICEL 2X4 (HEMOSTASIS) IMPLANT
KIT BASIN OR (CUSTOM PROCEDURE TRAY) ×2 IMPLANT
KIT ROOM TURNOVER OR (KITS) ×2 IMPLANT
LIQUID BAND (GAUZE/BANDAGES/DRESSINGS) ×2 IMPLANT
NS IRRIG 1000ML POUR BTL (IV SOLUTION) ×2 IMPLANT
PACK CV ACCESS (CUSTOM PROCEDURE TRAY) ×2 IMPLANT
PAD ARMBOARD 7.5X6 YLW CONV (MISCELLANEOUS) ×4 IMPLANT
SUT PROLENE 6 0 CC (SUTURE) ×3 IMPLANT
SUT SILK 2 0 SH (SUTURE) IMPLANT
SUT VIC AB 3-0 SH 27 (SUTURE) ×4
SUT VIC AB 3-0 SH 27X BRD (SUTURE) ×1 IMPLANT
SUT VICRYL 4-0 PS2 18IN ABS (SUTURE) ×3 IMPLANT
UNDERPAD 30X30 (UNDERPADS AND DIAPERS) ×2 IMPLANT
WATER STERILE IRR 1000ML POUR (IV SOLUTION) ×1 IMPLANT

## 2016-06-13 NOTE — Anesthesia Postprocedure Evaluation (Signed)
Anesthesia Post Note  Patient: Phillip Blair  Procedure(s) Performed: Procedure(s) (LRB): SECOND STAGE BASILIC VEIN TRANSPOSITION (Left)  Patient location during evaluation: PACU Anesthesia Type: General Level of consciousness: awake and alert, oriented and patient cooperative Pain management: pain level controlled Vital Signs Assessment: post-procedure vital signs reviewed and stable Respiratory status: spontaneous breathing, nonlabored ventilation and respiratory function stable Cardiovascular status: blood pressure returned to baseline and stable Postop Assessment: no signs of nausea or vomiting Anesthetic complications: no    Last Vitals:  Vitals:   06/13/16 1335 06/13/16 1341  BP:  128/75  Pulse: 74 71  Resp: 18 18  Temp:      Last Pain:  Vitals:   06/13/16 1310  TempSrc:   PainSc: 7                  [name],[name]

## 2016-06-13 NOTE — H&P (View-Only) (Signed)
   Patient name: Phillip Blair MRN: [id number] DOB: 02-24-1956 Sex: male  REASON FOR VISIT: Postop  HPI: Phillip Blair is a 60 y.o. male who returns today for follow-up.  He is status post left first stage basilic vein transposition on 03/30/2016.  He has no complaints today.  He has no evidence of steal.  Current Outpatient Prescriptions  Medication Sig Dispense Refill  . allopurinol (ZYLOPRIM) 100 MG tablet Take 100 mg by mouth daily.    Marland Kitchen amLODipine (NORVASC) 10 MG tablet Take 10 mg by mouth every morning.     . Azelastine HCl 0.15 % SOLN Place 2 Squirts into the nose 2 (two) times daily.    . furosemide (LASIX) 40 MG tablet Take 40 mg by mouth 2 (two) times daily.  5  . MUCINEX 600 MG 12 hr tablet Take 1,200 mg by mouth 2 (two) times daily as needed for cough.   0  . oxyCODONE-acetaminophen (ROXICET) 5-325 MG tablet Take 1 tablet by mouth every 6 (six) hours as needed for severe pain. 8 tablet 0  . sodium bicarbonate 650 MG tablet Take 650 mg by mouth 2 (two) times daily.  6   No current facility-administered medications for this visit.     REVIEW OF SYSTEMS:  [X]  denotes positive finding, [ ]  denotes negative finding Cardiac  Comments:  Chest pain or chest pressure:    Shortness of breath upon exertion:    Short of breath when lying flat:    Irregular heart rhythm:    Constitutional    Fever or chills:      PHYSICAL EXAM: Vitals:   05/21/16 1300  BP: 111/71  Pulse: (!) 55  Resp: 20  Temp: (!) 96.9 F (36.1 C)  TempSrc: Oral  Weight: 179 lb 3.2 oz (81.3 kg)  Height: 6' (1.829 m)    GENERAL: The patient is a well-nourished male, in no acute distress. The vital signs are documented above. CARDIOVASCULAR: There is a regular rate and rhythm. PULMONARY: There is good air exchange bilaterally without wheezing or rales. Easily palpable thrill within left basilic vein fistula.  Ultrasound of the vein was performed today.  This  shows an excellent diameter vein with most measurements greater than 7 mm.  MEDICAL ISSUES: Patient will be scheduled for a second stage basilic vein transposition on Wednesday, July 20.  The details of procedure discussed with patient.  He understands and wishes to proceed.  Annamarie Major, MD [facility] [phone] Pager [phone]

## 2016-06-13 NOTE — Transfer of Care (Signed)
Immediate Anesthesia Transfer of Care Note  Patient: Phillip Blair  Procedure(s) Performed: Procedure(s): SECOND STAGE BASILIC VEIN TRANSPOSITION (Left)  Patient Location: PACU  Anesthesia Type:General  Level of Consciousness: awake, oriented and patient cooperative  Airway & Oxygen Therapy: Patient Spontanous Breathing  Post-op Assessment: Report given to RN, Post -op Vital signs reviewed and stable and Patient moving all extremities  Post vital signs: Reviewed and stable  Last Vitals:  Vitals:   06/13/16 1013  BP: 122/73  Pulse: 67  Resp: 18  Temp: 36.8 C    Last Pain:  Vitals:   06/13/16 1013  TempSrc: Oral         Complications: No apparent anesthesia complications

## 2016-06-13 NOTE — Anesthesia Preprocedure Evaluation (Signed)
Anesthesia Evaluation    Airway        Dental   Pulmonary sleep apnea (no longer requires CPAP) ,           Cardiovascular hypertension, Pt. on medications (-) angina     Neuro/Psych    GI/Hepatic Neg liver ROS, GERD  ,  Endo/Other  negative endocrine ROS  Renal/GU ESRFRenal disease (not on dialysis yet, K+ 5.7)     Musculoskeletal   Abdominal   Peds  Hematology negative hematology ROS (+)   Anesthesia Other Findings   Reproductive/Obstetrics                             Anesthesia Physical Anesthesia Plan Anesthesia Quick Evaluation

## 2016-06-13 NOTE — Anesthesia Procedure Notes (Signed)
Procedure Name: LMA Insertion Date/Time: 06/13/2016 11:30 AM Performed by: Melina Copa, Toria Monte R Pre-anesthesia Checklist: Patient identified, Emergency Drugs available, Suction available and Patient being monitored Patient Re-evaluated:Patient Re-evaluated prior to inductionOxygen Delivery Method: Circle System Utilized Preoxygenation: Pre-oxygenation with 100% oxygen Intubation Type: IV induction Ventilation: Mask ventilation without difficulty LMA: LMA inserted LMA Size: 5.0 Number of attempts: 1 Placement Confirmation: positive ETCO2 Tube secured with: Tape Dental Injury: Teeth and Oropharynx as per pre-operative assessment

## 2016-06-13 NOTE — Anesthesia Preprocedure Evaluation (Addendum)
Anesthesia Evaluation  Patient identified by MRN, date of birth, ID band Patient awake    Reviewed: Allergy & Precautions, NPO status , Patient's Chart, lab work & pertinent test results  History of Anesthesia Complications Negative for: history of anesthetic complications  Airway Mallampati: II  TM Distance: >3 FB Neck ROM: Full    Dental  (+) Teeth Intact, Dental Advisory Given   Pulmonary sleep apnea ,    breath sounds clear to auscultation       Cardiovascular hypertension, Pt. on medications  Rhythm:Regular Rate:Normal     Neuro/Psych negative neurological ROS     GI/Hepatic negative GI ROS, Neg liver ROS,   Endo/Other  negative endocrine ROS  Renal/GU Renal InsufficiencyRenal disease     Musculoskeletal   Abdominal   Peds  Hematology negative hematology ROS (+)   Anesthesia Other Findings   Reproductive/Obstetrics                            Anesthesia Physical Anesthesia Plan  ASA: III  Anesthesia Plan: General   Post-op Pain Management:    Induction: Intravenous  Airway Management Planned: LMA  Additional Equipment: None  Intra-op Plan:   Post-operative Plan:   Informed Consent: I have reviewed the patients History and Physical, chart, labs and discussed the procedure including the risks, benefits and alternatives for the proposed anesthesia with the patient or authorized representative who has indicated his/her understanding and acceptance.   Dental advisory given  Plan Discussed with: CRNA, Anesthesiologist and Surgeon  Anesthesia Plan Comments: (Plan routine monitors, GA- LMA OK)       Anesthesia Quick Evaluation

## 2016-06-13 NOTE — Interval H&P Note (Signed)
History and Physical Interval Note:  06/13/2016 10:46 AM  Phillip Blair  has presented today for surgery, with the diagnosis of chronic kidney disease  The various methods of treatment have been discussed with the patient and family. After consideration of risks, benefits and other options for treatment, the patient has consented to  Procedure(s): SECOND STAGE BASILIC VEIN TRANSPOSITION (Left) as a surgical intervention .  The patient's history has been reviewed, patient examined, no change in status, stable for surgery.  I have reviewed the patient's chart and labs.  Questions were answered to the patient's satisfaction.     Annamarie Major

## 2016-06-14 ENCOUNTER — Encounter (HOSPITAL_COMMUNITY): Payer: Self-pay | Admitting: Surgery

## 2016-06-14 ENCOUNTER — Telehealth: Payer: Self-pay | Admitting: Surgery

## 2016-06-14 DIAGNOSIS — T82898A Other specified complication of vascular prosthetic devices, implants and grafts, initial encounter: Secondary | ICD-10-CM | POA: Diagnosis not present

## 2016-06-14 NOTE — Op Note (Signed)
    Patient name: Phillip Blair MRN: [id number] DOB: 09-Dec-1955 Sex: male  06/13/2016 Pre-operative Diagnosis: End stage renal disease Post-operative diagnosis:  Same Surgeon:  Annamarie Major Assistants:  Gerri Lins Procedure:   #1: Elevation of left basilic vein fistula   #2: Branch ligation 2   #3: Primary anastomosis, basilic vein fistula Anesthesia:  Gen. Blood Loss:  See anesthesia record Specimens:  None  Findings:  Excellent vein  Indications:  The patient previously had a first stage basilic vein procedure.  He is here today for elevation.  Procedure:  The patient was identified in the holding area and taken to [place] 16  The patient was then placed supine on the table. general anesthesia was administered.  The patient was prepped and draped in the usual sterile fashion.  A time out was called and antibiotics were administered.  2 longitudinal incision was made anterior to the basilic vein in the left arm.  Cautery is used to divide subcutaneous days tissue until the vein was exposed.  The vein was fully mobilized from antecubital crease up to the shoulder.  2 Side branches were ligated between silk ties.  I protected the associated nerve however the nerve did wrap around the vein.  For that reason I ended up occluding the fistula and transecting it, then bringing it anterior to the vein and performing a end to end anastomosis with 6-0 Prolene.  At this point, hemostasis was achieved.  I then reapproximated the subcutaneous tissue posterior to the fistula with interrupted 3-0 Vicryl suture.  The skin was closed directly anterior to the fistula with 4-0 Vicryl suture.  Dermabond was applied.   Disposition:  To PACU in stable condition.   Theotis Burrow, M.D. [facility] Office: [phone] Pager:  [phone]

## 2016-06-14 NOTE — Telephone Encounter (Signed)
Spoke to pt on mobile # aware of appt on 10/23

## 2016-06-14 NOTE — Telephone Encounter (Signed)
-----   Message from Mena Goes, RN sent at 06/13/2016 12:59 PM EDT ----- Regarding: schedule   ----- Message ----- From: Ulyses Amor, PA-C Sent: 06/13/2016  12:39 PM To: [facility] Charge Pool  F/u with Dr. Trula Slade in 3-4 weeks no study needed s/p basilic transposition

## 2016-06-27 ENCOUNTER — Encounter (HOSPITAL_COMMUNITY)
Admission: RE | Admit: 2016-06-27 | Discharge: 2016-06-27 | Disposition: A | Discharge status: Home / Self Care | Payer: 59 | Source: Ambulatory Visit | Attending: Nephrology | Admitting: Nephrology

## 2016-06-27 DIAGNOSIS — Z79899 Other long term (current) drug therapy: Secondary | ICD-10-CM | POA: Insufficient documentation

## 2016-06-27 DIAGNOSIS — N183 Chronic kidney disease, stage 3 (moderate): Secondary | ICD-10-CM | POA: Insufficient documentation

## 2016-06-27 DIAGNOSIS — D631 Anemia in chronic kidney disease: Secondary | ICD-10-CM | POA: Diagnosis not present

## 2016-06-27 DIAGNOSIS — Z5181 Encounter for therapeutic drug level monitoring: Secondary | ICD-10-CM | POA: Diagnosis not present

## 2016-06-27 LAB — COMPREHENSIVE METABOLIC PANEL
ALBUMIN: 3.7 g/dL (ref 3.5–5.0)
ALK PHOS: 166 U/L — AB (ref 38–126)
ALT: 23 U/L (ref 17–63)
ANION GAP: 9 (ref 5–15)
AST: 18 U/L (ref 15–41)
BUN: 106 mg/dL — ABNORMAL HIGH (ref 6–20)
CALCIUM: 10.3 mg/dL (ref 8.9–10.3)
CHLORIDE: 110 mmol/L (ref 101–111)
CO2: 18 mmol/L — AB (ref 22–32)
Creatinine, Ser: 5.95 mg/dL — ABNORMAL HIGH (ref 0.61–1.24)
GFR calc non Af Amer: 9 mL/min — ABNORMAL LOW (ref >60)
GFR, EST AFRICAN AMERICAN: 11 mL/min — AB (ref >60)
Glucose, Bld: 101 mg/dL — ABNORMAL HIGH (ref 65–99)
POTASSIUM: 5.4 mmol/L — AB (ref 3.5–5.1)
Sodium: 137 mmol/L (ref 135–145)
Total Bilirubin: 0.4 mg/dL (ref 0.3–1.2)
Total Protein: 7 g/dL (ref 6.5–8.1)

## 2016-06-27 LAB — PHOSPHORUS: Phosphorus: 5.9 mg/dL — ABNORMAL HIGH (ref 2.5–4.6)

## 2016-06-27 LAB — POCT HEMOGLOBIN-HEMACUE: Hemoglobin: 11.2 g/dL — ABNORMAL LOW (ref 13.0–17.0)

## 2016-06-27 MED ORDER — EPOETIN ALFA 10000 UNIT/ML IJ SOLN
INTRAMUSCULAR | Status: AC
  Administered 2016-06-27: 10000 [IU]
  Filled 2016-06-27: qty 1

## 2016-06-27 MED ORDER — EPOETIN ALFA 20000 UNIT/ML IJ SOLN: 10000.0000 [IU] | INTRAMUSCULAR | Status: DC

## 2016-07-11 ENCOUNTER — Encounter (HOSPITAL_COMMUNITY)
Admission: RE | Admit: 2016-07-11 | Discharge: 2016-07-11 | Disposition: A | Discharge status: Home / Self Care | Payer: 59 | Source: Ambulatory Visit | Attending: Nephrology | Admitting: Nephrology

## 2016-07-11 DIAGNOSIS — N185 Chronic kidney disease, stage 5: Secondary | ICD-10-CM

## 2016-07-11 DIAGNOSIS — N183 Chronic kidney disease, stage 3 (moderate): Secondary | ICD-10-CM | POA: Diagnosis not present

## 2016-07-11 LAB — POCT HEMOGLOBIN-HEMACUE: HEMOGLOBIN: 11.2 g/dL — AB (ref 13.0–17.0)

## 2016-07-11 MED ORDER — EPOETIN ALFA 10000 UNIT/ML IJ SOLN
10000.0000 [IU] | INTRAMUSCULAR | Status: DC
  Administered 2016-07-11: 10000 [IU] via SUBCUTANEOUS

## 2016-07-11 MED ORDER — EPOETIN ALFA 10000 UNIT/ML IJ SOLN
INTRAMUSCULAR | Status: AC
Start: 2016-07-11 — End: 2016-07-11
  Filled 2016-07-11: qty 1

## 2016-07-12 ENCOUNTER — Encounter: Payer: Self-pay | Admitting: Surgery

## 2016-07-16 ENCOUNTER — Encounter: Payer: Self-pay | Admitting: Surgery

## 2016-07-16 ENCOUNTER — Ambulatory Visit (INDEPENDENT_AMBULATORY_CARE_PROVIDER_SITE_OTHER): Payer: Self-pay | Admitting: Surgery

## 2016-07-16 VITALS — BP 125/75 | HR 64 | Temp 97.3°F | Resp 16 | Ht 72.0 in | Wt 180.0 lb

## 2016-07-16 DIAGNOSIS — N184 Chronic kidney disease, stage 4 (severe): Secondary | ICD-10-CM

## 2016-07-16 DIAGNOSIS — N185 Chronic kidney disease, stage 5: Secondary | ICD-10-CM

## 2016-07-16 NOTE — Progress Notes (Signed)
  POST OPERATIVE OFFICE NOTE    CC:  F/u for surgery  HPI:  This is a 60 y.o. male who is s/p elevation of left basilic vein transposition and branch ligation x 2 on 06/13/16 by Dr. Trula Slade.  He returns today for follow up.  He states that he has some numbness around the medial portion of his forearm and his elbow is tender to set down on a surface.  He denies any pain in his hand.  He states that he has some drainage from the incision more so at night that is clear red fluid.  It does not look like pus.  He denies any fevers.  Allergies  Allergen Reactions  . Ivp Dye [Iodinated Diagnostic Agents] Itching and Other (See Comments)    Peeling of skin    Current Outpatient Prescriptions  Medication Sig Dispense Refill  . allopurinol (ZYLOPRIM) 100 MG tablet Take 100 mg by mouth daily.    Marland Kitchen amLODipine (NORVASC) 10 MG tablet Take 10 mg by mouth every morning.     . Azelastine HCl 0.15 % SOLN Place 2 Squirts into the nose 2 (two) times daily.    . furosemide (LASIX) 40 MG tablet Take 40 mg by mouth 2 (two) times daily.  5  . sodium bicarbonate 650 MG tablet Take 650 mg by mouth 2 (two) times daily.  6  . oxyCODONE-acetaminophen (ROXICET) 5-325 MG tablet Take 1 tablet by mouth every 6 (six) hours as needed for severe pain. (Patient not taking: Reported on 07/16/2016) 8 tablet 0   No current facility-administered medications for this visit.      ROS:  See HPI  Physical Exam:  Vitals:   07/16/16 1343  BP: 125/75  Pulse: 64  Resp: 16  Temp: 97.3 F (36.3 C)    Incision:  Clean and dry with superficial dehiscence and Vicryl suture present.  Unable to express any drainage.  There is a scab about 2cm long in the mid portion of the incision.  Extremities:  Easily palpable left radial pulse; strong grip; strong thrill within the fistula   Assessment/Plan:  This is a 60 y.o. male who is s/p: Elevation of left BVT and branch ligatioin by Dr. Trula Slade on 06/13/16  -pt not yet on HD-his  fistula is maturing nicely with a strong thrill within the fistula. -he does have a superficial dehiscence of his wound with a scab ~ 2cm midway on the incision.  I could not express any drainage from the wound.  He denies any fevers. -continue to monitor-will see back in 6 weeks to make sure wound is healing.  -he will call us sooner with any issues -he may use 5lb weights for exercise.   Leontine Locket, PA-C [facility] [phone]  Clinic MD:  Pt seen and examined with Dr. Trula Slade  I agree with the above.  I have seen and evaluated the patient.  He has an excellent thrill within his basilic vein fistula.  There is some slight nonunion of the incision.  This should heal over time.  I haven't scheduled for follow-up in 6 weeks for wound check.  He is not yet on dialysis.  Annamarie Major

## 2016-07-25 ENCOUNTER — Encounter (HOSPITAL_COMMUNITY)
Admission: RE | Admit: 2016-07-25 | Discharge: 2016-07-25 | Disposition: A | Discharge status: Home / Self Care | Payer: 59 | Source: Ambulatory Visit | Attending: Nephrology | Admitting: Nephrology

## 2016-07-25 DIAGNOSIS — N184 Chronic kidney disease, stage 4 (severe): Secondary | ICD-10-CM | POA: Diagnosis present

## 2016-07-25 DIAGNOSIS — N185 Chronic kidney disease, stage 5: Secondary | ICD-10-CM

## 2016-07-25 LAB — COMPREHENSIVE METABOLIC PANEL
ALBUMIN: 3.6 g/dL (ref 3.5–5.0)
ALK PHOS: 155 U/L — AB (ref 38–126)
ALT: 23 U/L (ref 17–63)
AST: 19 U/L (ref 15–41)
Anion gap: 6 (ref 5–15)
BUN: 85 mg/dL — AB (ref 6–20)
CALCIUM: 10.3 mg/dL (ref 8.9–10.3)
CHLORIDE: 113 mmol/L — AB (ref 101–111)
CO2: 19 mmol/L — AB (ref 22–32)
CREATININE: 5.43 mg/dL — AB (ref 0.61–1.24)
GFR calc non Af Amer: 10 mL/min — ABNORMAL LOW (ref >60)
GFR, EST AFRICAN AMERICAN: 12 mL/min — AB (ref >60)
GLUCOSE: 91 mg/dL (ref 65–99)
Potassium: 5.2 mmol/L — ABNORMAL HIGH (ref 3.5–5.1)
SODIUM: 138 mmol/L (ref 135–145)
Total Bilirubin: 0.9 mg/dL (ref 0.3–1.2)
Total Protein: 6.5 g/dL (ref 6.5–8.1)

## 2016-07-25 LAB — IRON AND TIBC
Iron: 93 ug/dL (ref 45–182)
Saturation Ratios: 27 % (ref 17.9–39.5)
TIBC: 340 ug/dL (ref 250–450)
UIBC: 247 ug/dL

## 2016-07-25 LAB — POCT HEMOGLOBIN-HEMACUE: Hemoglobin: 11.8 g/dL — ABNORMAL LOW (ref 13.0–17.0)

## 2016-07-25 LAB — FERRITIN: Ferritin: 234 ng/mL (ref 24–336)

## 2016-07-25 LAB — PHOSPHORUS: PHOSPHORUS: 5.9 mg/dL — AB (ref 2.5–4.6)

## 2016-07-25 MED ORDER — EPOETIN ALFA 10000 UNIT/ML IJ SOLN
10000.0000 [IU] | INTRAMUSCULAR | Status: DC
  Administered 2016-07-25: 10000 [IU] via SUBCUTANEOUS

## 2016-07-25 MED ORDER — EPOETIN ALFA 10000 UNIT/ML IJ SOLN
INTRAMUSCULAR | Status: AC
  Filled 2016-07-25: qty 1

## 2016-07-26 LAB — HEPATITIS B SURFACE ANTIGEN: Hepatitis B Surface Ag: NEGATIVE

## 2016-08-08 ENCOUNTER — Encounter (HOSPITAL_COMMUNITY)
Admission: RE | Admit: 2016-08-08 | Discharge: 2016-08-08 | Disposition: A | Discharge status: Home / Self Care | Payer: 59 | Source: Ambulatory Visit | Attending: Nephrology | Admitting: Nephrology

## 2016-08-08 DIAGNOSIS — N184 Chronic kidney disease, stage 4 (severe): Secondary | ICD-10-CM | POA: Diagnosis not present

## 2016-08-08 DIAGNOSIS — N185 Chronic kidney disease, stage 5: Secondary | ICD-10-CM

## 2016-08-08 LAB — POCT HEMOGLOBIN-HEMACUE: Hemoglobin: 12.3 g/dL — ABNORMAL LOW (ref 13.0–17.0)

## 2016-08-08 MED ORDER — EPOETIN ALFA 10000 UNIT/ML IJ SOLN: 10000.0000 [IU] | INTRAMUSCULAR | Status: DC

## 2016-08-21 ENCOUNTER — Other Ambulatory Visit (HOSPITAL_COMMUNITY): Payer: Self-pay | Admitting: *Deleted

## 2016-08-22 ENCOUNTER — Encounter (HOSPITAL_COMMUNITY)
Admission: RE | Admit: 2016-08-22 | Discharge: 2016-08-22 | Disposition: A | Discharge status: Home / Self Care | Payer: 59 | Source: Ambulatory Visit | Attending: Nephrology | Admitting: Nephrology

## 2016-08-22 ENCOUNTER — Encounter: Payer: Self-pay | Admitting: Surgery

## 2016-08-22 DIAGNOSIS — N185 Chronic kidney disease, stage 5: Secondary | ICD-10-CM

## 2016-08-22 DIAGNOSIS — N184 Chronic kidney disease, stage 4 (severe): Secondary | ICD-10-CM | POA: Diagnosis not present

## 2016-08-22 LAB — COMPREHENSIVE METABOLIC PANEL
ALBUMIN: 3.5 g/dL (ref 3.5–5.0)
ALK PHOS: 186 U/L — AB (ref 38–126)
ALT: 26 U/L (ref 17–63)
AST: 17 U/L (ref 15–41)
Anion gap: 10 (ref 5–15)
BUN: 95 mg/dL — AB (ref 6–20)
CALCIUM: 10.1 mg/dL (ref 8.9–10.3)
CO2: 16 mmol/L — AB (ref 22–32)
CREATININE: 5.37 mg/dL — AB (ref 0.61–1.24)
Chloride: 110 mmol/L (ref 101–111)
GFR calc Af Amer: 12 mL/min — ABNORMAL LOW (ref >60)
GFR calc non Af Amer: 10 mL/min — ABNORMAL LOW (ref >60)
GLUCOSE: 96 mg/dL (ref 65–99)
Potassium: 5.1 mmol/L (ref 3.5–5.1)
SODIUM: 136 mmol/L (ref 135–145)
Total Bilirubin: 0.6 mg/dL (ref 0.3–1.2)
Total Protein: 6.8 g/dL (ref 6.5–8.1)

## 2016-08-22 LAB — PHOSPHORUS: Phosphorus: 5.5 mg/dL — ABNORMAL HIGH (ref 2.5–4.6)

## 2016-08-22 NOTE — Progress Notes (Signed)
Shot held hgb 12.2

## 2016-08-23 ENCOUNTER — Other Ambulatory Visit: Payer: Self-pay | Admitting: Urology

## 2016-08-23 ENCOUNTER — Ambulatory Visit (HOSPITAL_COMMUNITY)
Admission: RE | Admit: 2016-08-23 | Discharge: 2016-08-23 | Disposition: A | Discharge status: Home / Self Care | Payer: 59 | Source: Ambulatory Visit | Attending: Urology | Admitting: Urology

## 2016-08-23 DIAGNOSIS — C642 Malignant neoplasm of left kidney, except renal pelvis: Secondary | ICD-10-CM

## 2016-08-23 LAB — POCT HEMOGLOBIN-HEMACUE: HEMOGLOBIN: 12.2 g/dL — AB (ref 13.0–17.0)

## 2016-08-27 ENCOUNTER — Ambulatory Visit (INDEPENDENT_AMBULATORY_CARE_PROVIDER_SITE_OTHER): Payer: Self-pay | Admitting: Surgery

## 2016-08-27 ENCOUNTER — Encounter: Payer: Self-pay | Admitting: Surgery

## 2016-08-27 VITALS — BP 130/76 | HR 82 | Temp 99.2°F | Resp 18 | Ht 72.0 in | Wt 181.0 lb

## 2016-08-27 DIAGNOSIS — N185 Chronic kidney disease, stage 5: Secondary | ICD-10-CM

## 2016-08-27 NOTE — Progress Notes (Signed)
   Patient name: Phillip Blair MRN: [id number] DOB: 1955/12/16 Sex: male  REASON FOR VISIT: post-op  HPI: Phillip Blair is a 60 y.o. male returns today for follow-up.  He is status post basilic vein transposition, the second procedure was performed on 06/13/2016.  He is not yet on dialysis.  He did have a superficial wound separation and therefore he is back for further evaluation.  He does state that he has numbness around his elbow and on the medial side of his arm.  He denies any symptoms of steal  Current Outpatient Prescriptions  Medication Sig Dispense Refill  . allopurinol (ZYLOPRIM) 100 MG tablet Take 100 mg by mouth daily.    Marland Kitchen amLODipine (NORVASC) 10 MG tablet Take 10 mg by mouth every morning.     . Azelastine HCl 0.15 % SOLN Place 2 Squirts into the nose 2 (two) times daily.    . furosemide (LASIX) 40 MG tablet Take 40 mg by mouth 2 (two) times daily.  5  . sodium bicarbonate 650 MG tablet Take 650 mg by mouth 2 (two) times daily.  6  . oxyCODONE-acetaminophen (ROXICET) 5-325 MG tablet Take 1 tablet by mouth every 6 (six) hours as needed for severe pain. (Patient not taking: Reported on 08/27/2016) 8 tablet 0   No current facility-administered medications for this visit.     REVIEW OF SYSTEMS:  [X]  denotes positive finding, [ ]  denotes negative finding Cardiac  Comments:  Chest pain or chest pressure:    Shortness of breath upon exertion:    Short of breath when lying flat:    Irregular heart rhythm:    Constitutional    Fever or chills:      PHYSICAL EXAM: Vitals:   08/27/16 0938  BP: 130/76  Pulse: 82  Resp: 18  Temp: 99.2 F (37.3 C)  TempSrc: Oral  SpO2: 98%  Weight: 181 lb (82.1 kg)  Height: 6' (1.829 m)    GENERAL: The patient is a well-nourished male, in no acute distress. The vital signs are documented above. CARDIOVASCULAR: There is a regular rate and rhythm. PULMONARY: There is good air exchange bilaterally  without wheezing or rales. Excellent thrill within the fistula  MEDICAL ISSUES: Functioning left basilic vein transposition, which is ready for use.  Unfortunately, the patient does have significant numbness around his elbow and other parts of his arm.  I discussed with him that hopefully this will improve over time but this certainly could be permanent.  Annamarie Major, MD [facility] [phone] Pager [phone]

## 2016-09-05 ENCOUNTER — Encounter (HOSPITAL_COMMUNITY)
Admission: RE | Admit: 2016-09-05 | Discharge: 2016-09-05 | Disposition: A | Discharge status: Home / Self Care | Payer: 59 | Source: Ambulatory Visit | Attending: Nephrology | Admitting: Nephrology

## 2016-09-05 DIAGNOSIS — N184 Chronic kidney disease, stage 4 (severe): Secondary | ICD-10-CM | POA: Insufficient documentation

## 2016-09-05 DIAGNOSIS — N185 Chronic kidney disease, stage 5: Secondary | ICD-10-CM

## 2016-09-05 LAB — POCT HEMOGLOBIN-HEMACUE: HEMOGLOBIN: 11.4 g/dL — AB (ref 13.0–17.0)

## 2016-09-05 MED ORDER — EPOETIN ALFA 10000 UNIT/ML IJ SOLN
10000.0000 [IU] | INTRAMUSCULAR | Status: DC
  Administered 2016-09-05: 10000 [IU] via SUBCUTANEOUS

## 2016-09-05 MED ORDER — EPOETIN ALFA 10000 UNIT/ML IJ SOLN
INTRAMUSCULAR | Status: AC
  Filled 2016-09-05: qty 1

## 2016-10-02 ENCOUNTER — Encounter: Payer: Self-pay | Admitting: Family Medicine

## 2016-10-03 ENCOUNTER — Encounter (HOSPITAL_COMMUNITY)
Admission: RE | Admit: 2016-10-03 | Discharge: 2016-10-03 | Disposition: A | Discharge status: Home / Self Care | Payer: 59 | Source: Ambulatory Visit | Attending: Nephrology | Admitting: Nephrology

## 2016-10-03 ENCOUNTER — Encounter (HOSPITAL_COMMUNITY): Payer: 59

## 2016-10-03 DIAGNOSIS — N185 Chronic kidney disease, stage 5: Secondary | ICD-10-CM

## 2016-10-03 DIAGNOSIS — N184 Chronic kidney disease, stage 4 (severe): Secondary | ICD-10-CM | POA: Insufficient documentation

## 2016-10-03 LAB — COMPREHENSIVE METABOLIC PANEL
ALK PHOS: 208 U/L — AB (ref 38–126)
ALT: 25 U/L (ref 17–63)
AST: 16 U/L (ref 15–41)
Albumin: 3.7 g/dL (ref 3.5–5.0)
Anion gap: 6 (ref 5–15)
BILIRUBIN TOTAL: 0.6 mg/dL (ref 0.3–1.2)
BUN: 94 mg/dL — AB (ref 6–20)
CALCIUM: 10.4 mg/dL — AB (ref 8.9–10.3)
CHLORIDE: 110 mmol/L (ref 101–111)
CO2: 21 mmol/L — ABNORMAL LOW (ref 22–32)
CREATININE: 6.2 mg/dL — AB (ref 0.61–1.24)
GFR, EST AFRICAN AMERICAN: 10 mL/min — AB (ref >60)
GFR, EST NON AFRICAN AMERICAN: 9 mL/min — AB (ref >60)
Glucose, Bld: 90 mg/dL (ref 65–99)
Potassium: 5.7 mmol/L — ABNORMAL HIGH (ref 3.5–5.1)
Sodium: 137 mmol/L (ref 135–145)
TOTAL PROTEIN: 6.8 g/dL (ref 6.5–8.1)

## 2016-10-03 LAB — PHOSPHORUS: PHOSPHORUS: 6.2 mg/dL — AB (ref 2.5–4.6)

## 2016-10-03 LAB — IRON AND TIBC
Iron: 101 ug/dL (ref 45–182)
SATURATION RATIOS: 28 % (ref 17.9–39.5)
TIBC: 361 ug/dL (ref 250–450)
UIBC: 260 ug/dL

## 2016-10-03 LAB — POCT HEMOGLOBIN-HEMACUE: HEMOGLOBIN: 11.3 g/dL — AB (ref 13.0–17.0)

## 2016-10-03 LAB — FERRITIN: FERRITIN: 319 ng/mL (ref 24–336)

## 2016-10-03 MED ORDER — EPOETIN ALFA 10000 UNIT/ML IJ SOLN
10000.0000 [IU] | INTRAMUSCULAR | Status: DC
  Administered 2016-10-03: 10000 [IU] via SUBCUTANEOUS

## 2016-10-03 MED ORDER — EPOETIN ALFA 10000 UNIT/ML IJ SOLN
INTRAMUSCULAR | Status: AC
  Administered 2016-10-03: 10000 [IU] via SUBCUTANEOUS
  Filled 2016-10-03: qty 1

## 2016-10-04 LAB — HEPATITIS B SURFACE ANTIGEN: HEP B S AG: NEGATIVE

## 2016-10-31 ENCOUNTER — Encounter (HOSPITAL_COMMUNITY)
Admission: RE | Admit: 2016-10-31 | Discharge: 2016-10-31 | Disposition: A | Discharge status: Home / Self Care | Payer: 59 | Source: Ambulatory Visit | Attending: Nephrology | Admitting: Nephrology

## 2016-10-31 DIAGNOSIS — N185 Chronic kidney disease, stage 5: Secondary | ICD-10-CM

## 2016-10-31 DIAGNOSIS — N184 Chronic kidney disease, stage 4 (severe): Secondary | ICD-10-CM | POA: Insufficient documentation

## 2016-10-31 LAB — COMPREHENSIVE METABOLIC PANEL
ALBUMIN: 3.5 g/dL (ref 3.5–5.0)
ALT: 16 U/L — AB (ref 17–63)
AST: 12 U/L — AB (ref 15–41)
Alkaline Phosphatase: 194 U/L — ABNORMAL HIGH (ref 38–126)
Anion gap: 10 (ref 5–15)
BUN: 86 mg/dL — AB (ref 6–20)
CHLORIDE: 110 mmol/L (ref 101–111)
CO2: 17 mmol/L — AB (ref 22–32)
CREATININE: 6.12 mg/dL — AB (ref 0.61–1.24)
Calcium: 10.3 mg/dL (ref 8.9–10.3)
GFR calc Af Amer: 10 mL/min — ABNORMAL LOW (ref >60)
GFR, EST NON AFRICAN AMERICAN: 9 mL/min — AB (ref >60)
GLUCOSE: 97 mg/dL (ref 65–99)
Potassium: 5.2 mmol/L — ABNORMAL HIGH (ref 3.5–5.1)
Sodium: 137 mmol/L (ref 135–145)
Total Bilirubin: 0.6 mg/dL (ref 0.3–1.2)
Total Protein: 6.6 g/dL (ref 6.5–8.1)

## 2016-10-31 LAB — PHOSPHORUS: PHOSPHORUS: 5.7 mg/dL — AB (ref 2.5–4.6)

## 2016-10-31 LAB — POCT HEMOGLOBIN-HEMACUE: Hemoglobin: 11.2 g/dL — ABNORMAL LOW (ref 13.0–17.0)

## 2016-10-31 MED ORDER — EPOETIN ALFA 10000 UNIT/ML IJ SOLN
10000.0000 [IU] | INTRAMUSCULAR | Status: DC
  Administered 2016-10-31: 10000 [IU] via SUBCUTANEOUS

## 2016-10-31 MED ORDER — EPOETIN ALFA 10000 UNIT/ML IJ SOLN
INTRAMUSCULAR | Status: AC
  Filled 2016-10-31: qty 1

## 2016-11-27 ENCOUNTER — Other Ambulatory Visit (HOSPITAL_COMMUNITY): Payer: Self-pay | Admitting: *Deleted

## 2016-11-28 ENCOUNTER — Encounter (HOSPITAL_COMMUNITY)
Admission: RE | Admit: 2016-11-28 | Discharge: 2016-11-28 | Disposition: A | Discharge status: Home / Self Care | Payer: 59 | Source: Ambulatory Visit | Attending: Nephrology | Admitting: Nephrology

## 2016-11-28 DIAGNOSIS — N184 Chronic kidney disease, stage 4 (severe): Secondary | ICD-10-CM | POA: Insufficient documentation

## 2016-11-28 DIAGNOSIS — N185 Chronic kidney disease, stage 5: Secondary | ICD-10-CM

## 2016-11-28 LAB — IRON AND TIBC
IRON: 71 ug/dL (ref 45–182)
Saturation Ratios: 21 % (ref 17.9–39.5)
TIBC: 335 ug/dL (ref 250–450)
UIBC: 264 ug/dL

## 2016-11-28 LAB — RENAL FUNCTION PANEL
Albumin: 3.6 g/dL (ref 3.5–5.0)
Anion gap: 10 (ref 5–15)
BUN: 86 mg/dL — AB (ref 6–20)
CHLORIDE: 113 mmol/L — AB (ref 101–111)
CO2: 16 mmol/L — ABNORMAL LOW (ref 22–32)
CREATININE: 5.84 mg/dL — AB (ref 0.61–1.24)
Calcium: 10.2 mg/dL (ref 8.9–10.3)
GFR calc Af Amer: 11 mL/min — ABNORMAL LOW (ref >60)
GFR, EST NON AFRICAN AMERICAN: 9 mL/min — AB (ref >60)
GLUCOSE: 97 mg/dL (ref 65–99)
Phosphorus: 5 mg/dL — ABNORMAL HIGH (ref 2.5–4.6)
Potassium: 5.7 mmol/L — ABNORMAL HIGH (ref 3.5–5.1)
Sodium: 139 mmol/L (ref 135–145)

## 2016-11-28 LAB — FERRITIN: FERRITIN: 288 ng/mL (ref 24–336)

## 2016-11-28 LAB — POCT HEMOGLOBIN-HEMACUE: HEMOGLOBIN: 10.6 g/dL — AB (ref 13.0–17.0)

## 2016-11-28 MED ORDER — EPOETIN ALFA 10000 UNIT/ML IJ SOLN
INTRAMUSCULAR | Status: AC
  Filled 2016-11-28: qty 1

## 2016-11-28 MED ORDER — EPOETIN ALFA 10000 UNIT/ML IJ SOLN
10000.0000 [IU] | INTRAMUSCULAR | Status: DC
  Administered 2016-11-28: 10000 [IU] via SUBCUTANEOUS

## 2016-11-29 LAB — PTH, INTACT AND CALCIUM
CALCIUM TOTAL (PTH): 9.9 mg/dL (ref 8.6–10.2)
PTH: 949 pg/mL — ABNORMAL HIGH (ref 15–65)

## 2016-11-29 LAB — HEPATITIS B SURFACE ANTIGEN: Hepatitis B Surface Ag: NEGATIVE

## 2016-12-26 ENCOUNTER — Encounter (HOSPITAL_COMMUNITY)
Admission: RE | Admit: 2016-12-26 | Discharge: 2016-12-26 | Disposition: A | Discharge status: Home / Self Care | Payer: 59 | Source: Ambulatory Visit | Attending: Nephrology | Admitting: Nephrology

## 2016-12-26 DIAGNOSIS — N185 Chronic kidney disease, stage 5: Secondary | ICD-10-CM

## 2016-12-26 DIAGNOSIS — N184 Chronic kidney disease, stage 4 (severe): Secondary | ICD-10-CM | POA: Insufficient documentation

## 2016-12-26 LAB — RENAL FUNCTION PANEL
Albumin: 3.4 g/dL — ABNORMAL LOW (ref 3.5–5.0)
Anion gap: 9 (ref 5–15)
BUN: 78 mg/dL — AB (ref 6–20)
CHLORIDE: 112 mmol/L — AB (ref 101–111)
CO2: 18 mmol/L — ABNORMAL LOW (ref 22–32)
CREATININE: 5.43 mg/dL — AB (ref 0.61–1.24)
Calcium: 10.1 mg/dL (ref 8.9–10.3)
GFR calc Af Amer: 12 mL/min — ABNORMAL LOW (ref >60)
GFR calc non Af Amer: 10 mL/min — ABNORMAL LOW (ref >60)
Glucose, Bld: 93 mg/dL (ref 65–99)
Phosphorus: 5.4 mg/dL — ABNORMAL HIGH (ref 2.5–4.6)
Potassium: 5.1 mmol/L (ref 3.5–5.1)
SODIUM: 139 mmol/L (ref 135–145)

## 2016-12-26 LAB — POCT HEMOGLOBIN-HEMACUE: HEMOGLOBIN: 10.3 g/dL — AB (ref 13.0–17.0)

## 2016-12-26 MED ORDER — EPOETIN ALFA 10000 UNIT/ML IJ SOLN
INTRAMUSCULAR | Status: AC
  Administered 2016-12-26: 10000 [IU] via SUBCUTANEOUS
  Filled 2016-12-26: qty 1

## 2016-12-26 MED ORDER — EPOETIN ALFA 10000 UNIT/ML IJ SOLN
10000.0000 [IU] | INTRAMUSCULAR | Status: DC
  Administered 2016-12-26: 10000 [IU] via SUBCUTANEOUS

## 2016-12-27 LAB — PTH, INTACT AND CALCIUM
CALCIUM TOTAL (PTH): 9.9 mg/dL (ref 8.6–10.2)
PTH: 967 pg/mL — ABNORMAL HIGH (ref 15–65)

## 2017-01-23 ENCOUNTER — Encounter (HOSPITAL_COMMUNITY): Payer: 59

## 2017-01-25 ENCOUNTER — Encounter (HOSPITAL_COMMUNITY)
Admission: RE | Admit: 2017-01-25 | Discharge: 2017-01-25 | Disposition: A | Discharge status: Home / Self Care | Payer: BLUE CROSS/BLUE SHIELD | Source: Ambulatory Visit | Attending: Nephrology | Admitting: Nephrology

## 2017-01-25 DIAGNOSIS — N184 Chronic kidney disease, stage 4 (severe): Secondary | ICD-10-CM | POA: Insufficient documentation

## 2017-01-25 DIAGNOSIS — N185 Chronic kidney disease, stage 5: Secondary | ICD-10-CM

## 2017-01-25 LAB — RENAL FUNCTION PANEL
Albumin: 3.7 g/dL (ref 3.5–5.0)
Anion gap: 11 (ref 5–15)
BUN: 87 mg/dL — AB (ref 6–20)
CHLORIDE: 110 mmol/L (ref 101–111)
CO2: 16 mmol/L — AB (ref 22–32)
CREATININE: 5.74 mg/dL — AB (ref 0.61–1.24)
Calcium: 9.8 mg/dL (ref 8.9–10.3)
GFR calc Af Amer: 11 mL/min — ABNORMAL LOW (ref >60)
GFR calc non Af Amer: 10 mL/min — ABNORMAL LOW (ref >60)
Glucose, Bld: 95 mg/dL (ref 65–99)
POTASSIUM: 5.3 mmol/L — AB (ref 3.5–5.1)
Phosphorus: 6.4 mg/dL — ABNORMAL HIGH (ref 2.5–4.6)
Sodium: 137 mmol/L (ref 135–145)

## 2017-01-25 LAB — POCT HEMOGLOBIN-HEMACUE: HEMOGLOBIN: 11 g/dL — AB (ref 13.0–17.0)

## 2017-01-25 LAB — IRON AND TIBC
Iron: 82 ug/dL (ref 45–182)
Saturation Ratios: 24 % (ref 17.9–39.5)
TIBC: 337 ug/dL (ref 250–450)
UIBC: 255 ug/dL

## 2017-01-25 LAB — FERRITIN: FERRITIN: 241 ng/mL (ref 24–336)

## 2017-01-25 MED ORDER — EPOETIN ALFA 10000 UNIT/ML IJ SOLN
10000.0000 [IU] | INTRAMUSCULAR | Status: DC
  Administered 2017-01-25: 10000 [IU] via SUBCUTANEOUS

## 2017-01-25 MED ORDER — EPOETIN ALFA 10000 UNIT/ML IJ SOLN
INTRAMUSCULAR | Status: AC
  Filled 2017-01-25: qty 1

## 2017-01-26 LAB — PTH, INTACT AND CALCIUM
Calcium, Total (PTH): 9.8 mg/dL (ref 8.6–10.2)
PTH: 1176 pg/mL — ABNORMAL HIGH (ref 15–65)

## 2017-01-26 LAB — HEPATITIS B SURFACE ANTIGEN: Hepatitis B Surface Ag: NEGATIVE

## 2017-02-26 ENCOUNTER — Other Ambulatory Visit (HOSPITAL_COMMUNITY): Payer: Self-pay | Admitting: *Deleted

## 2017-02-27 ENCOUNTER — Encounter (HOSPITAL_COMMUNITY)
Admission: RE | Admit: 2017-02-27 | Discharge: 2017-02-27 | Disposition: A | Discharge status: Home / Self Care | Payer: BLUE CROSS/BLUE SHIELD | Source: Ambulatory Visit | Attending: Nephrology | Admitting: Nephrology

## 2017-02-27 DIAGNOSIS — N184 Chronic kidney disease, stage 4 (severe): Secondary | ICD-10-CM | POA: Diagnosis not present

## 2017-02-27 DIAGNOSIS — N185 Chronic kidney disease, stage 5: Secondary | ICD-10-CM

## 2017-02-27 LAB — RENAL FUNCTION PANEL
ALBUMIN: 3.7 g/dL (ref 3.5–5.0)
ANION GAP: 7 (ref 5–15)
BUN: 99 mg/dL — ABNORMAL HIGH (ref 6–20)
CO2: 18 mmol/L — ABNORMAL LOW (ref 22–32)
Calcium: 10 mg/dL (ref 8.9–10.3)
Chloride: 114 mmol/L — ABNORMAL HIGH (ref 101–111)
Creatinine, Ser: 6.2 mg/dL — ABNORMAL HIGH (ref 0.61–1.24)
GFR calc Af Amer: 10 mL/min — ABNORMAL LOW (ref >60)
GFR calc non Af Amer: 9 mL/min — ABNORMAL LOW (ref >60)
GLUCOSE: 87 mg/dL (ref 65–99)
PHOSPHORUS: 6 mg/dL — AB (ref 2.5–4.6)
Potassium: 5.1 mmol/L (ref 3.5–5.1)
SODIUM: 139 mmol/L (ref 135–145)

## 2017-02-27 LAB — POCT HEMOGLOBIN-HEMACUE: Hemoglobin: 10.8 g/dL — ABNORMAL LOW (ref 13.0–17.0)

## 2017-02-27 MED ORDER — EPOETIN ALFA 10000 UNIT/ML IJ SOLN
INTRAMUSCULAR | Status: AC
  Filled 2017-02-27: qty 1

## 2017-02-27 MED ORDER — EPOETIN ALFA 10000 UNIT/ML IJ SOLN
10000.0000 [IU] | INTRAMUSCULAR | Status: DC
  Administered 2017-02-27: 10000 [IU] via SUBCUTANEOUS

## 2017-02-28 LAB — PTH, INTACT AND CALCIUM
CALCIUM TOTAL (PTH): 9.8 mg/dL (ref 8.6–10.2)
PTH: 1239 pg/mL — ABNORMAL HIGH (ref 15–65)

## 2017-03-26 ENCOUNTER — Encounter (HOSPITAL_COMMUNITY)
Admission: RE | Admit: 2017-03-26 | Discharge: 2017-03-26 | Disposition: A | Discharge status: Home / Self Care | Payer: BLUE CROSS/BLUE SHIELD | Source: Ambulatory Visit | Attending: Nephrology | Admitting: Nephrology

## 2017-03-26 DIAGNOSIS — N184 Chronic kidney disease, stage 4 (severe): Secondary | ICD-10-CM | POA: Diagnosis not present

## 2017-03-26 DIAGNOSIS — N185 Chronic kidney disease, stage 5: Secondary | ICD-10-CM

## 2017-03-26 LAB — RENAL FUNCTION PANEL
ANION GAP: 9 (ref 5–15)
Albumin: 3.6 g/dL (ref 3.5–5.0)
BUN: 91 mg/dL — ABNORMAL HIGH (ref 6–20)
CO2: 17 mmol/L — ABNORMAL LOW (ref 22–32)
Calcium: 10 mg/dL (ref 8.9–10.3)
Chloride: 112 mmol/L — ABNORMAL HIGH (ref 101–111)
Creatinine, Ser: 6 mg/dL — ABNORMAL HIGH (ref 0.61–1.24)
GFR, EST AFRICAN AMERICAN: 11 mL/min — AB (ref >60)
GFR, EST NON AFRICAN AMERICAN: 9 mL/min — AB (ref >60)
Glucose, Bld: 92 mg/dL (ref 65–99)
POTASSIUM: 5.5 mmol/L — AB (ref 3.5–5.1)
Phosphorus: 5.4 mg/dL — ABNORMAL HIGH (ref 2.5–4.6)
Sodium: 138 mmol/L (ref 135–145)

## 2017-03-26 LAB — IRON AND TIBC
IRON: 79 ug/dL (ref 45–182)
SATURATION RATIOS: 23 % (ref 17.9–39.5)
TIBC: 339 ug/dL (ref 250–450)
UIBC: 260 ug/dL

## 2017-03-26 LAB — FERRITIN: Ferritin: 242 ng/mL (ref 24–336)

## 2017-03-26 MED ORDER — EPOETIN ALFA 10000 UNIT/ML IJ SOLN
10000.0000 [IU] | INTRAMUSCULAR | Status: DC
  Administered 2017-03-26: 10000 [IU] via SUBCUTANEOUS

## 2017-03-26 MED ORDER — EPOETIN ALFA 10000 UNIT/ML IJ SOLN
INTRAMUSCULAR | Status: AC
  Administered 2017-03-26: 10000 [IU] via SUBCUTANEOUS
  Filled 2017-03-26: qty 1

## 2017-03-27 LAB — HEPATITIS B SURFACE ANTIGEN: Hepatitis B Surface Ag: NEGATIVE

## 2017-03-27 LAB — PTH, INTACT AND CALCIUM
CALCIUM TOTAL (PTH): 9.8 mg/dL (ref 8.6–10.2)
PTH: 1393 pg/mL — AB (ref 15–65)

## 2017-03-27 LAB — POCT HEMOGLOBIN-HEMACUE: HEMOGLOBIN: 11.5 g/dL — AB (ref 13.0–17.0)

## 2017-04-22 ENCOUNTER — Encounter (HOSPITAL_COMMUNITY)
Admission: RE | Admit: 2017-04-22 | Discharge: 2017-04-22 | Disposition: A | Discharge status: Home / Self Care | Payer: BLUE CROSS/BLUE SHIELD | Source: Ambulatory Visit | Attending: Nephrology | Admitting: Nephrology

## 2017-04-22 DIAGNOSIS — N185 Chronic kidney disease, stage 5: Secondary | ICD-10-CM

## 2017-04-22 DIAGNOSIS — N184 Chronic kidney disease, stage 4 (severe): Secondary | ICD-10-CM | POA: Diagnosis not present

## 2017-04-22 LAB — RENAL FUNCTION PANEL
ALBUMIN: 3.6 g/dL (ref 3.5–5.0)
Anion gap: 10 (ref 5–15)
BUN: 87 mg/dL — AB (ref 6–20)
CALCIUM: 9.4 mg/dL (ref 8.9–10.3)
CO2: 16 mmol/L — ABNORMAL LOW (ref 22–32)
CREATININE: 6.06 mg/dL — AB (ref 0.61–1.24)
Chloride: 114 mmol/L — ABNORMAL HIGH (ref 101–111)
GFR, EST AFRICAN AMERICAN: 10 mL/min — AB (ref >60)
GFR, EST NON AFRICAN AMERICAN: 9 mL/min — AB (ref >60)
Glucose, Bld: 121 mg/dL — ABNORMAL HIGH (ref 65–99)
Phosphorus: 4.9 mg/dL — ABNORMAL HIGH (ref 2.5–4.6)
Potassium: 4.8 mmol/L (ref 3.5–5.1)
SODIUM: 140 mmol/L (ref 135–145)

## 2017-04-22 LAB — POCT HEMOGLOBIN-HEMACUE: HEMOGLOBIN: 10.9 g/dL — AB (ref 13.0–17.0)

## 2017-04-22 MED ORDER — EPOETIN ALFA 10000 UNIT/ML IJ SOLN
10000.0000 [IU] | INTRAMUSCULAR | Status: DC
Start: 2017-04-22 — End: 2017-04-23
  Administered 2017-04-22: 10000 [IU] via SUBCUTANEOUS

## 2017-04-22 MED ORDER — EPOETIN ALFA 10000 UNIT/ML IJ SOLN
INTRAMUSCULAR | Status: AC
  Filled 2017-04-22: qty 1

## 2017-04-23 LAB — PTH, INTACT AND CALCIUM
CALCIUM TOTAL (PTH): 9.1 mg/dL (ref 8.6–10.2)
PTH: 1056 pg/mL — ABNORMAL HIGH (ref 15–65)

## 2017-04-24 ENCOUNTER — Encounter (HOSPITAL_COMMUNITY): Payer: BLUE CROSS/BLUE SHIELD

## 2017-05-16 ENCOUNTER — Encounter: Payer: Self-pay | Admitting: Internal Medicine

## 2017-05-22 ENCOUNTER — Encounter (HOSPITAL_COMMUNITY)
Admission: RE | Admit: 2017-05-22 | Discharge: 2017-05-22 | Disposition: A | Discharge status: Home / Self Care | Payer: BLUE CROSS/BLUE SHIELD | Source: Ambulatory Visit | Attending: Nephrology | Admitting: Nephrology

## 2017-05-22 DIAGNOSIS — N185 Chronic kidney disease, stage 5: Secondary | ICD-10-CM

## 2017-05-22 DIAGNOSIS — N184 Chronic kidney disease, stage 4 (severe): Secondary | ICD-10-CM | POA: Diagnosis not present

## 2017-05-22 LAB — CBC
HEMATOCRIT: 32.9 % — AB (ref 39.0–52.0)
Hemoglobin: 10.4 g/dL — ABNORMAL LOW (ref 13.0–17.0)
MCH: 28.8 pg (ref 26.0–34.0)
MCHC: 31.6 g/dL (ref 30.0–36.0)
MCV: 91.1 fL (ref 78.0–100.0)
Platelets: 240 10*3/uL (ref 150–400)
RBC: 3.61 MIL/uL — ABNORMAL LOW (ref 4.22–5.81)
RDW: 14.3 % (ref 11.5–15.5)
WBC: 7.7 10*3/uL (ref 4.0–10.5)

## 2017-05-22 LAB — COMPREHENSIVE METABOLIC PANEL
ALT: 21 U/L (ref 17–63)
AST: 13 U/L — ABNORMAL LOW (ref 15–41)
Albumin: 3.6 g/dL (ref 3.5–5.0)
Alkaline Phosphatase: 156 U/L — ABNORMAL HIGH (ref 38–126)
Anion gap: 9 (ref 5–15)
BILIRUBIN TOTAL: 0.7 mg/dL (ref 0.3–1.2)
BUN: 79 mg/dL — AB (ref 6–20)
CO2: 20 mmol/L — ABNORMAL LOW (ref 22–32)
CREATININE: 6.03 mg/dL — AB (ref 0.61–1.24)
Calcium: 9.9 mg/dL (ref 8.9–10.3)
Chloride: 108 mmol/L (ref 101–111)
GFR, EST AFRICAN AMERICAN: 10 mL/min — AB (ref >60)
GFR, EST NON AFRICAN AMERICAN: 9 mL/min — AB (ref >60)
Glucose, Bld: 90 mg/dL (ref 65–99)
POTASSIUM: 5.8 mmol/L — AB (ref 3.5–5.1)
Sodium: 137 mmol/L (ref 135–145)
TOTAL PROTEIN: 6.8 g/dL (ref 6.5–8.1)

## 2017-05-22 LAB — PHOSPHORUS: PHOSPHORUS: 5.2 mg/dL — AB (ref 2.5–4.6)

## 2017-05-22 LAB — POCT HEMOGLOBIN-HEMACUE: HEMOGLOBIN: 10.6 g/dL — AB (ref 13.0–17.0)

## 2017-05-22 LAB — IRON AND TIBC
IRON: 86 ug/dL (ref 45–182)
Saturation Ratios: 25 % (ref 17.9–39.5)
TIBC: 337 ug/dL (ref 250–450)
UIBC: 251 ug/dL

## 2017-05-22 LAB — FERRITIN: FERRITIN: 326 ng/mL (ref 24–336)

## 2017-05-22 MED ORDER — EPOETIN ALFA 10000 UNIT/ML IJ SOLN
INTRAMUSCULAR | Status: AC
Start: 2017-05-22 — End: 2017-05-22
  Administered 2017-05-22: 10000 [IU] via SUBCUTANEOUS
  Filled 2017-05-22: qty 1

## 2017-05-22 MED ORDER — EPOETIN ALFA 10000 UNIT/ML IJ SOLN
10000.0000 [IU] | INTRAMUSCULAR | Status: DC
  Administered 2017-05-22: 10000 [IU] via SUBCUTANEOUS

## 2017-05-23 LAB — HEPATITIS B SURFACE ANTIGEN: Hepatitis B Surface Ag: NEGATIVE

## 2017-06-19 ENCOUNTER — Encounter (HOSPITAL_COMMUNITY): Payer: BLUE CROSS/BLUE SHIELD

## 2017-06-21 ENCOUNTER — Encounter (HOSPITAL_COMMUNITY)
Admission: RE | Admit: 2017-06-21 | Discharge: 2017-06-21 | Disposition: A | Discharge status: Home / Self Care | Payer: BLUE CROSS/BLUE SHIELD | Source: Ambulatory Visit | Attending: Nephrology | Admitting: Nephrology

## 2017-06-21 DIAGNOSIS — N184 Chronic kidney disease, stage 4 (severe): Secondary | ICD-10-CM | POA: Insufficient documentation

## 2017-06-21 DIAGNOSIS — N185 Chronic kidney disease, stage 5: Secondary | ICD-10-CM

## 2017-06-21 LAB — COMPREHENSIVE METABOLIC PANEL
ALT: 16 U/L — AB (ref 17–63)
ANION GAP: 9 (ref 5–15)
AST: 12 U/L — ABNORMAL LOW (ref 15–41)
Albumin: 3.5 g/dL (ref 3.5–5.0)
Alkaline Phosphatase: 199 U/L — ABNORMAL HIGH (ref 38–126)
BUN: 93 mg/dL — ABNORMAL HIGH (ref 6–20)
CO2: 17 mmol/L — ABNORMAL LOW (ref 22–32)
Calcium: 9.9 mg/dL (ref 8.9–10.3)
Chloride: 112 mmol/L — ABNORMAL HIGH (ref 101–111)
Creatinine, Ser: 6.53 mg/dL — ABNORMAL HIGH (ref 0.61–1.24)
GFR calc non Af Amer: 8 mL/min — ABNORMAL LOW (ref >60)
GFR, EST AFRICAN AMERICAN: 10 mL/min — AB (ref >60)
Glucose, Bld: 97 mg/dL (ref 65–99)
POTASSIUM: 5.1 mmol/L (ref 3.5–5.1)
SODIUM: 138 mmol/L (ref 135–145)
TOTAL PROTEIN: 6.7 g/dL (ref 6.5–8.1)
Total Bilirubin: 0.4 mg/dL (ref 0.3–1.2)

## 2017-06-21 LAB — CBC
HCT: 31.7 % — ABNORMAL LOW (ref 39.0–52.0)
HEMOGLOBIN: 10.4 g/dL — AB (ref 13.0–17.0)
MCH: 29.4 pg (ref 26.0–34.0)
MCHC: 32.8 g/dL (ref 30.0–36.0)
MCV: 89.5 fL (ref 78.0–100.0)
Platelets: 212 10*3/uL (ref 150–400)
RBC: 3.54 MIL/uL — AB (ref 4.22–5.81)
RDW: 14.4 % (ref 11.5–15.5)
WBC: 6.7 10*3/uL (ref 4.0–10.5)

## 2017-06-21 LAB — POCT HEMOGLOBIN-HEMACUE: HEMOGLOBIN: 10.6 g/dL — AB (ref 13.0–17.0)

## 2017-06-21 LAB — PHOSPHORUS: Phosphorus: 6.2 mg/dL — ABNORMAL HIGH (ref 2.5–4.6)

## 2017-06-21 MED ORDER — EPOETIN ALFA 10000 UNIT/ML IJ SOLN
10000.0000 [IU] | INTRAMUSCULAR | Status: DC
  Administered 2017-06-21: 10000 [IU] via SUBCUTANEOUS

## 2017-06-21 MED ORDER — EPOETIN ALFA 10000 UNIT/ML IJ SOLN
INTRAMUSCULAR | Status: AC
  Filled 2017-06-21: qty 1

## 2017-07-11 ENCOUNTER — Ambulatory Visit (AMBULATORY_SURGERY_CENTER): Payer: Self-pay

## 2017-07-11 VITALS — Ht 72.0 in | Wt 187.4 lb

## 2017-07-11 DIAGNOSIS — Z1211 Encounter for screening for malignant neoplasm of colon: Secondary | ICD-10-CM

## 2017-07-11 NOTE — Progress Notes (Signed)
No allergies to eggs or soy No diet meds No home oxygen No past problems with anesthesia  Registered emmi 

## 2017-07-15 ENCOUNTER — Encounter: Payer: Self-pay | Admitting: Internal Medicine

## 2017-07-17 ENCOUNTER — Encounter (HOSPITAL_COMMUNITY)
Admission: RE | Admit: 2017-07-17 | Discharge: 2017-07-17 | Disposition: A | Discharge status: Home / Self Care | Payer: BLUE CROSS/BLUE SHIELD | Source: Ambulatory Visit | Attending: Nephrology | Admitting: Nephrology

## 2017-07-17 DIAGNOSIS — N185 Chronic kidney disease, stage 5: Secondary | ICD-10-CM

## 2017-07-17 DIAGNOSIS — N184 Chronic kidney disease, stage 4 (severe): Secondary | ICD-10-CM | POA: Diagnosis not present

## 2017-07-17 LAB — COMPREHENSIVE METABOLIC PANEL
ALBUMIN: 3.7 g/dL (ref 3.5–5.0)
ALK PHOS: 231 U/L — AB (ref 38–126)
ALT: 16 U/L — ABNORMAL LOW (ref 17–63)
ANION GAP: 12 (ref 5–15)
AST: 14 U/L — ABNORMAL LOW (ref 15–41)
BILIRUBIN TOTAL: 0.6 mg/dL (ref 0.3–1.2)
BUN: 84 mg/dL — ABNORMAL HIGH (ref 6–20)
CALCIUM: 10.1 mg/dL (ref 8.9–10.3)
CO2: 16 mmol/L — ABNORMAL LOW (ref 22–32)
Chloride: 108 mmol/L (ref 101–111)
Creatinine, Ser: 6.82 mg/dL — ABNORMAL HIGH (ref 0.61–1.24)
GFR, EST AFRICAN AMERICAN: 9 mL/min — AB (ref >60)
GFR, EST NON AFRICAN AMERICAN: 8 mL/min — AB (ref >60)
GLUCOSE: 136 mg/dL — AB (ref 65–99)
POTASSIUM: 5.1 mmol/L (ref 3.5–5.1)
Sodium: 136 mmol/L (ref 135–145)
TOTAL PROTEIN: 6.9 g/dL (ref 6.5–8.1)

## 2017-07-17 LAB — CBC
HCT: 33.8 % — ABNORMAL LOW (ref 39.0–52.0)
HEMOGLOBIN: 10.8 g/dL — AB (ref 13.0–17.0)
MCH: 29.2 pg (ref 26.0–34.0)
MCHC: 32 g/dL (ref 30.0–36.0)
MCV: 91.4 fL (ref 78.0–100.0)
PLATELETS: 223 10*3/uL (ref 150–400)
RBC: 3.7 MIL/uL — AB (ref 4.22–5.81)
RDW: 13.9 % (ref 11.5–15.5)
WBC: 6.1 10*3/uL (ref 4.0–10.5)

## 2017-07-17 LAB — PHOSPHORUS: PHOSPHORUS: 4.4 mg/dL (ref 2.5–4.6)

## 2017-07-17 LAB — IRON AND TIBC
Iron: 82 ug/dL (ref 45–182)
SATURATION RATIOS: 26 % (ref 17.9–39.5)
TIBC: 312 ug/dL (ref 250–450)
UIBC: 230 ug/dL

## 2017-07-17 LAB — FERRITIN: Ferritin: 309 ng/mL (ref 24–336)

## 2017-07-17 MED ORDER — EPOETIN ALFA 10000 UNIT/ML IJ SOLN
INTRAMUSCULAR | Status: AC
  Filled 2017-07-17: qty 1

## 2017-07-17 MED ORDER — EPOETIN ALFA 10000 UNIT/ML IJ SOLN
10000.0000 [IU] | INTRAMUSCULAR | Status: DC
  Administered 2017-07-17: 10000 [IU] via SUBCUTANEOUS

## 2017-07-18 LAB — PTH, INTACT AND CALCIUM
CALCIUM TOTAL (PTH): 10 mg/dL (ref 8.6–10.2)
PTH: 1320 pg/mL — AB (ref 15–65)

## 2017-07-18 LAB — HEPATITIS B SURFACE ANTIGEN: HEP B S AG: NEGATIVE

## 2017-07-25 ENCOUNTER — Encounter: Payer: Self-pay | Admitting: Internal Medicine

## 2017-07-25 ENCOUNTER — Ambulatory Visit (AMBULATORY_SURGERY_CENTER): Payer: BLUE CROSS/BLUE SHIELD | Admitting: Internal Medicine

## 2017-07-25 VITALS — BP 138/68 | HR 64 | Temp 97.7°F | Resp 16 | Ht 72.0 in | Wt 187.0 lb

## 2017-07-25 DIAGNOSIS — D128 Benign neoplasm of rectum: Secondary | ICD-10-CM

## 2017-07-25 DIAGNOSIS — Z1212 Encounter for screening for malignant neoplasm of rectum: Secondary | ICD-10-CM | POA: Diagnosis not present

## 2017-07-25 DIAGNOSIS — Z1211 Encounter for screening for malignant neoplasm of colon: Secondary | ICD-10-CM

## 2017-07-25 MED ORDER — SODIUM CHLORIDE 0.9 % IV SOLN
500.0000 mL | INTRAVENOUS | Status: DC
Start: 2017-07-25 — End: 2018-01-04

## 2017-07-25 NOTE — Op Note (Signed)
[facility] Patient Name: Phillip Blair Procedure Date: 07/25/2017 10:41 AM MRN: [id number] Endoscopist: [name] , MD Age: 61 Referring MD:  Date of Birth: 20-Aug-1956 Gender: Male Account #: [id number] Procedure:                Colonoscopy Indications:              Screening for colorectal malignant neoplasm, Last                            colonoscopy: 2008 Medicines:                Propofol per Anesthesia, Monitored Anesthesia Care Procedure:                Pre-Anesthesia Assessment:                           - Prior to the procedure, a History and Physical                            was performed, and patient medications and                            allergies were reviewed. The patient's tolerance of                            previous anesthesia was also reviewed. The risks                            and benefits of the procedure and the sedation                            options and risks were discussed with the patient.                            All questions were answered, and informed consent                            was obtained. Prior Anticoagulants: The patient has                            taken no previous anticoagulant or antiplatelet                            agents. ASA Grade Assessment: III - A patient with                            severe systemic disease. After reviewing the risks                            and benefits, the patient was deemed in                            satisfactory condition to undergo the procedure.  After obtaining informed consent, the colonoscope                            was passed under direct vision. Throughout the                            procedure, the patient's blood pressure, pulse, and                            oxygen saturations were monitored continuously. The                            Colonoscope was introduced through the anus and                            advanced to  the the cecum, identified by                            appendiceal orifice and ileocecal valve. The                            colonoscopy was performed without difficulty. The                            patient tolerated the procedure well. The quality                            of the bowel preparation was good. The bowel                            preparation used was Miralax. The ileocecal valve,                            appendiceal orifice, and rectum were photographed. Scope In: 10:55:52 AM Scope Out: 11:06:55 AM Scope Withdrawal Time: 0 hours 8 minutes 41 seconds  Total Procedure Duration: 0 hours 11 minutes 3 seconds  Findings:                 The perianal and digital rectal examinations were                            normal.                           A 5 mm polyp was found in the rectum. The polyp was                            sessile. The polyp was removed with a cold snare.                            Resection and retrieval were complete. Verification                            of patient identification for the specimen was  done. Estimated blood loss was minimal.                           Scattered large-mouthed diverticula were found in                            the entire colon.                           The exam was otherwise without abnormality on                            direct and retroflexion views. Complications:            No immediate complications. Estimated Blood Loss:     Estimated blood loss was minimal. Impression:               - One 5 mm polyp in the rectum, removed with a cold                            snare. Resected and retrieved.                           - Diverticulosis in the entire examined colon.                           - The examination was otherwise normal on direct                            and retroflexion views. Recommendation:           - Patient has a contact number available for                             emergencies. The signs and symptoms of potential                            delayed complications were discussed with the                            patient. Return to normal activities tomorrow.                            Written discharge instructions were provided to the                            patient.                           - Resume previous diet.                           - Continue present medications.                           - Repeat colonoscopy is recommended. The  colonoscopy date will be determined after pathology                            results from today's exam become available for                            review. [name], MD 07/25/2017 11:12:49 AM This report has been signed electronically.

## 2017-07-25 NOTE — Progress Notes (Signed)
Report to PACU, RN, vss, BBS= Clear.  

## 2017-07-25 NOTE — Patient Instructions (Addendum)
I found and removed one small polyp that looks benign. You also have a condition called diverticulosis - common and not usually a problem. Please read the handout provided.  I will let you know pathology results and when to have another routine colonoscopy by mail and/or My Chart.  I appreciate the opportunity to care for you. [name], MD, FACG YOU HAD AN ENDOSCOPIC PROCEDURE TODAY AT [facility]:   Refer to the procedure report that was given to you for any specific questions about what was found during the examination.  If the procedure report does not answer your questions, please call your gastroenterologist to clarify.  If you requested that your care partner not be given the details of your procedure findings, then the procedure report has been included in a sealed envelope for you to review at your convenience later.  YOU SHOULD EXPECT: Some feelings of bloating in the abdomen. Passage of more gas than usual.  Walking can help get rid of the air that was put into your GI tract during the procedure and reduce the bloating. If you had a lower endoscopy (such as a colonoscopy or flexible sigmoidoscopy) you may notice spotting of blood in your stool or on the toilet paper. If you underwent a bowel prep for your procedure, you may not have a normal bowel movement for a few days.  Please Note:  You might notice some irritation and congestion in your nose or some drainage.  This is from the oxygen used during your procedure.  There is no need for concern and it should clear up in a day or so.  SYMPTOMS TO REPORT IMMEDIATELY:   Following lower endoscopy (colonoscopy or flexible sigmoidoscopy):  Excessive amounts of blood in the stool  Significant tenderness or worsening of abdominal pains  Swelling of the abdomen that is new, acute  Fever of 100F or higher  For urgent or emergent issues, a gastroenterologist can be reached at any hour by calling [phone]  [phone].   DIET:  We do recommend a small meal at first, but then you may proceed to your regular diet.  Drink plenty of fluids but you should avoid alcoholic beverages for 24 hours.  MEDICATIONS: Continue present medications.  Please see handouts given to you by your recovery nurse.  ACTIVITY:  You should plan to take it easy for the rest of today and you should NOT DRIVE or use heavy machinery until tomorrow (because of the sedation medicines used during the test).    FOLLOW UP: Our staff will call the number listed on your records the next business day following your procedure to check on you and address any questions or concerns that you may have regarding the information given to you following your procedure. If we do not reach you, we will leave a message.  However, if you are feeling well and you are not experiencing any problems, there is no need to return our call.  We will assume that you have returned to your regular daily activities without incident.  If any biopsies were taken you will be contacted by phone or by letter within the next 1-3 weeks.  Please call us at [phone] if you have not heard about the biopsies in 3 weeks.   Thank you for allowing [name] to provide for your healthcare needs today.  SIGNATURES/CONFIDENTIALITY: You and/or your care partner have signed paperwork which will be entered into your electronic medical record.  These signatures attest  to the fact that that the information above on your After Visit Summary has been reviewed and is understood.  Full responsibility of the confidentiality of this discharge information lies with you and/or your care-partner. 

## 2017-07-25 NOTE — Progress Notes (Signed)
Pt's states no medical or surgical changes since previsit or office visit. 

## 2017-07-25 NOTE — Progress Notes (Signed)
Called to room to assist during endoscopic procedure.  Patient ID and intended procedure confirmed with present staff. Received instructions for my participation in the procedure from the performing physician.  

## 2017-07-26 ENCOUNTER — Telehealth: Payer: Self-pay | Admitting: *Deleted

## 2017-07-26 NOTE — Telephone Encounter (Signed)
  Follow up Call-  Call back number 07/25/2017  Post procedure Call Back phone  # [phone]  Permission to leave phone message Yes  Some recent data might be hidden     Patient questions:  Do you have a fever, pain , or abdominal swelling? No. Pain Score  0 *  Have you tolerated food without any problems? Yes.    Have you been able to return to your normal activities? Yes.    Do you have any questions about your discharge instructions: Diet   No. Medications  No. Follow up visit  No.  Do you have questions or concerns about your Care? No.  Actions: * If pain score is 4 or above: No action needed, pain <4.

## 2017-07-30 ENCOUNTER — Encounter: Payer: Self-pay | Admitting: Internal Medicine

## 2017-07-30 DIAGNOSIS — Z86010 Personal history of colonic polyps: Secondary | ICD-10-CM | POA: Insufficient documentation

## 2017-07-30 DIAGNOSIS — Z860101 Personal history of adenomatous and serrated colon polyps: Secondary | ICD-10-CM | POA: Insufficient documentation

## 2017-07-30 NOTE — Progress Notes (Signed)
5 mm adenoma recall 2025 My Chart letter

## 2017-08-14 ENCOUNTER — Encounter (HOSPITAL_COMMUNITY)
Admission: RE | Admit: 2017-08-14 | Discharge: 2017-08-14 | Disposition: A | Discharge status: Home / Self Care | Payer: BLUE CROSS/BLUE SHIELD | Source: Ambulatory Visit | Attending: Nephrology | Admitting: Nephrology

## 2017-08-14 VITALS — BP 132/73 | HR 72 | Resp 16

## 2017-08-14 DIAGNOSIS — N185 Chronic kidney disease, stage 5: Secondary | ICD-10-CM

## 2017-08-14 DIAGNOSIS — N184 Chronic kidney disease, stage 4 (severe): Secondary | ICD-10-CM | POA: Diagnosis not present

## 2017-08-14 LAB — COMPREHENSIVE METABOLIC PANEL
ALK PHOS: 251 U/L — AB (ref 38–126)
ALT: 16 U/L — AB (ref 17–63)
AST: 12 U/L — ABNORMAL LOW (ref 15–41)
Albumin: 3.7 g/dL (ref 3.5–5.0)
Anion gap: 10 (ref 5–15)
BUN: 102 mg/dL — AB (ref 6–20)
CO2: 16 mmol/L — AB (ref 22–32)
CREATININE: 6.98 mg/dL — AB (ref 0.61–1.24)
Calcium: 10 mg/dL (ref 8.9–10.3)
Chloride: 112 mmol/L — ABNORMAL HIGH (ref 101–111)
GFR, EST AFRICAN AMERICAN: 9 mL/min — AB (ref >60)
GFR, EST NON AFRICAN AMERICAN: 8 mL/min — AB (ref >60)
GLUCOSE: 97 mg/dL (ref 65–99)
Potassium: 5 mmol/L (ref 3.5–5.1)
Sodium: 138 mmol/L (ref 135–145)
Total Bilirubin: 0.8 mg/dL (ref 0.3–1.2)
Total Protein: 6.8 g/dL (ref 6.5–8.1)

## 2017-08-14 LAB — CBC
HEMATOCRIT: 32 % — AB (ref 39.0–52.0)
Hemoglobin: 10.3 g/dL — ABNORMAL LOW (ref 13.0–17.0)
MCH: 29.3 pg (ref 26.0–34.0)
MCHC: 32.2 g/dL (ref 30.0–36.0)
MCV: 90.9 fL (ref 78.0–100.0)
Platelets: 230 10*3/uL (ref 150–400)
RBC: 3.52 MIL/uL — ABNORMAL LOW (ref 4.22–5.81)
RDW: 14.3 % (ref 11.5–15.5)
WBC: 6.2 10*3/uL (ref 4.0–10.5)

## 2017-08-14 LAB — PHOSPHORUS: Phosphorus: 6.1 mg/dL — ABNORMAL HIGH (ref 2.5–4.6)

## 2017-08-14 MED ORDER — EPOETIN ALFA 10000 UNIT/ML IJ SOLN
INTRAMUSCULAR | Status: AC
  Filled 2017-08-14: qty 1

## 2017-08-14 MED ORDER — EPOETIN ALFA 10000 UNIT/ML IJ SOLN
10000.0000 [IU] | INTRAMUSCULAR | Status: DC
  Administered 2017-08-14: 10000 [IU] via SUBCUTANEOUS

## 2017-08-23 ENCOUNTER — Other Ambulatory Visit: Payer: Self-pay

## 2017-09-11 ENCOUNTER — Encounter (HOSPITAL_COMMUNITY)
Admission: RE | Admit: 2017-09-11 | Discharge: 2017-09-11 | Disposition: A | Discharge status: Home / Self Care | Payer: BLUE CROSS/BLUE SHIELD | Source: Ambulatory Visit | Attending: Nephrology | Admitting: Nephrology

## 2017-09-11 VITALS — BP 124/69 | HR 67 | Temp 98.2°F | Resp 18

## 2017-09-11 DIAGNOSIS — N184 Chronic kidney disease, stage 4 (severe): Secondary | ICD-10-CM | POA: Insufficient documentation

## 2017-09-11 DIAGNOSIS — N185 Chronic kidney disease, stage 5: Secondary | ICD-10-CM

## 2017-09-11 LAB — IRON AND TIBC
IRON: 95 ug/dL (ref 45–182)
Saturation Ratios: 30 % (ref 17.9–39.5)
TIBC: 316 ug/dL (ref 250–450)
UIBC: 221 ug/dL

## 2017-09-11 LAB — CBC
HEMATOCRIT: 32.8 % — AB (ref 39.0–52.0)
Hemoglobin: 10.3 g/dL — ABNORMAL LOW (ref 13.0–17.0)
MCH: 29.1 pg (ref 26.0–34.0)
MCHC: 31.4 g/dL (ref 30.0–36.0)
MCV: 92.7 fL (ref 78.0–100.0)
Platelets: 216 10*3/uL (ref 150–400)
RBC: 3.54 MIL/uL — ABNORMAL LOW (ref 4.22–5.81)
RDW: 14.6 % (ref 11.5–15.5)
WBC: 6 10*3/uL (ref 4.0–10.5)

## 2017-09-11 LAB — COMPREHENSIVE METABOLIC PANEL
ALT: 12 U/L — AB (ref 17–63)
AST: 11 U/L — AB (ref 15–41)
Albumin: 3.6 g/dL (ref 3.5–5.0)
Alkaline Phosphatase: 259 U/L — ABNORMAL HIGH (ref 38–126)
Anion gap: 7 (ref 5–15)
BUN: 80 mg/dL — AB (ref 6–20)
CHLORIDE: 113 mmol/L — AB (ref 101–111)
CO2: 17 mmol/L — ABNORMAL LOW (ref 22–32)
CREATININE: 6.65 mg/dL — AB (ref 0.61–1.24)
Calcium: 9.8 mg/dL (ref 8.9–10.3)
GFR calc Af Amer: 9 mL/min — ABNORMAL LOW (ref >60)
GFR, EST NON AFRICAN AMERICAN: 8 mL/min — AB (ref >60)
Glucose, Bld: 89 mg/dL (ref 65–99)
Potassium: 5.6 mmol/L — ABNORMAL HIGH (ref 3.5–5.1)
Sodium: 137 mmol/L (ref 135–145)
Total Bilirubin: 0.5 mg/dL (ref 0.3–1.2)
Total Protein: 6.5 g/dL (ref 6.5–8.1)

## 2017-09-11 LAB — PHOSPHORUS: PHOSPHORUS: 5.2 mg/dL — AB (ref 2.5–4.6)

## 2017-09-11 LAB — FERRITIN: FERRITIN: 265 ng/mL (ref 24–336)

## 2017-09-11 MED ORDER — EPOETIN ALFA 10000 UNIT/ML IJ SOLN
INTRAMUSCULAR | Status: AC
  Filled 2017-09-11: qty 1

## 2017-09-11 MED ORDER — EPOETIN ALFA 10000 UNIT/ML IJ SOLN
10000.0000 [IU] | INTRAMUSCULAR | Status: DC
  Administered 2017-09-11: 10000 [IU] via SUBCUTANEOUS

## 2017-09-12 LAB — HEPATITIS B SURFACE ANTIGEN: HEP B S AG: NEGATIVE

## 2017-10-09 ENCOUNTER — Ambulatory Visit (HOSPITAL_COMMUNITY)
Admission: RE | Admit: 2017-10-09 | Discharge: 2017-10-09 | Disposition: A | Discharge status: Home / Self Care | Payer: BLUE CROSS/BLUE SHIELD | Source: Ambulatory Visit | Attending: Nephrology | Admitting: Nephrology

## 2017-10-09 VITALS — BP 128/67 | HR 80 | Temp 97.7°F | Resp 18

## 2017-10-09 DIAGNOSIS — Z5181 Encounter for therapeutic drug level monitoring: Secondary | ICD-10-CM | POA: Diagnosis not present

## 2017-10-09 DIAGNOSIS — N183 Chronic kidney disease, stage 3 (moderate): Secondary | ICD-10-CM | POA: Insufficient documentation

## 2017-10-09 DIAGNOSIS — D631 Anemia in chronic kidney disease: Secondary | ICD-10-CM | POA: Insufficient documentation

## 2017-10-09 DIAGNOSIS — N185 Chronic kidney disease, stage 5: Secondary | ICD-10-CM

## 2017-10-09 DIAGNOSIS — Z79899 Other long term (current) drug therapy: Secondary | ICD-10-CM | POA: Insufficient documentation

## 2017-10-09 LAB — COMPREHENSIVE METABOLIC PANEL
ALT: 12 U/L — ABNORMAL LOW (ref 17–63)
ANION GAP: 12 (ref 5–15)
AST: 10 U/L — AB (ref 15–41)
Albumin: 3.8 g/dL (ref 3.5–5.0)
Alkaline Phosphatase: 261 U/L — ABNORMAL HIGH (ref 38–126)
BILIRUBIN TOTAL: 0.5 mg/dL (ref 0.3–1.2)
BUN: 111 mg/dL — AB (ref 6–20)
CO2: 16 mmol/L — ABNORMAL LOW (ref 22–32)
Calcium: 10.1 mg/dL (ref 8.9–10.3)
Chloride: 111 mmol/L (ref 101–111)
Creatinine, Ser: 7.7 mg/dL — ABNORMAL HIGH (ref 0.61–1.24)
GFR, EST AFRICAN AMERICAN: 8 mL/min — AB (ref >60)
GFR, EST NON AFRICAN AMERICAN: 7 mL/min — AB (ref >60)
Glucose, Bld: 94 mg/dL (ref 65–99)
POTASSIUM: 5.3 mmol/L — AB (ref 3.5–5.1)
Sodium: 139 mmol/L (ref 135–145)
TOTAL PROTEIN: 7.1 g/dL (ref 6.5–8.1)

## 2017-10-09 LAB — CBC
HEMATOCRIT: 32.8 % — AB (ref 39.0–52.0)
HEMOGLOBIN: 10.4 g/dL — AB (ref 13.0–17.0)
MCH: 29 pg (ref 26.0–34.0)
MCHC: 31.7 g/dL (ref 30.0–36.0)
MCV: 91.4 fL (ref 78.0–100.0)
Platelets: 257 10*3/uL (ref 150–400)
RBC: 3.59 MIL/uL — AB (ref 4.22–5.81)
RDW: 14.4 % (ref 11.5–15.5)
WBC: 6.7 10*3/uL (ref 4.0–10.5)

## 2017-10-09 LAB — PHOSPHORUS: PHOSPHORUS: 6.5 mg/dL — AB (ref 2.5–4.6)

## 2017-10-09 MED ORDER — EPOETIN ALFA 10000 UNIT/ML IJ SOLN
INTRAMUSCULAR | Status: AC
Start: 2017-10-09 — End: 2017-10-09
  Filled 2017-10-09: qty 1

## 2017-10-09 MED ORDER — EPOETIN ALFA 10000 UNIT/ML IJ SOLN
10000.0000 [IU] | INTRAMUSCULAR | Status: DC
  Administered 2017-10-09: 10000 [IU] via SUBCUTANEOUS

## 2017-10-10 LAB — PTH, INTACT AND CALCIUM
Calcium, Total (PTH): 10.2 mg/dL (ref 8.6–10.2)
PTH: 1740 pg/mL — AB (ref 15–65)

## 2017-11-04 DIAGNOSIS — Z0279 Encounter for issue of other medical certificate: Secondary | ICD-10-CM | POA: Diagnosis not present

## 2017-11-06 ENCOUNTER — Encounter (HOSPITAL_COMMUNITY)
Admission: RE | Admit: 2017-11-06 | Discharge: 2017-11-06 | Disposition: A | Discharge status: Home / Self Care | Payer: BLUE CROSS/BLUE SHIELD | Source: Ambulatory Visit | Attending: Nephrology | Admitting: Nephrology

## 2017-11-06 VITALS — BP 130/70 | HR 83 | Temp 98.2°F

## 2017-11-06 DIAGNOSIS — N185 Chronic kidney disease, stage 5: Secondary | ICD-10-CM

## 2017-11-06 DIAGNOSIS — N184 Chronic kidney disease, stage 4 (severe): Secondary | ICD-10-CM | POA: Diagnosis not present

## 2017-11-06 LAB — RENAL FUNCTION PANEL
ALBUMIN: 3.6 g/dL (ref 3.5–5.0)
ANION GAP: 14 (ref 5–15)
BUN: 109 mg/dL — ABNORMAL HIGH (ref 6–20)
CALCIUM: 10.1 mg/dL (ref 8.9–10.3)
CO2: 16 mmol/L — AB (ref 22–32)
Chloride: 109 mmol/L (ref 101–111)
Creatinine, Ser: 7.47 mg/dL — ABNORMAL HIGH (ref 0.61–1.24)
GFR calc non Af Amer: 7 mL/min — ABNORMAL LOW (ref >60)
GFR, EST AFRICAN AMERICAN: 8 mL/min — AB (ref >60)
Glucose, Bld: 111 mg/dL — ABNORMAL HIGH (ref 65–99)
PHOSPHORUS: 5.2 mg/dL — AB (ref 2.5–4.6)
Potassium: 5 mmol/L (ref 3.5–5.1)
SODIUM: 139 mmol/L (ref 135–145)

## 2017-11-06 LAB — IRON AND TIBC
Iron: 87 ug/dL (ref 45–182)
Saturation Ratios: 26 % (ref 17.9–39.5)
TIBC: 340 ug/dL (ref 250–450)
UIBC: 253 ug/dL

## 2017-11-06 LAB — FERRITIN: FERRITIN: 275 ng/mL (ref 24–336)

## 2017-11-06 LAB — POCT HEMOGLOBIN-HEMACUE: Hemoglobin: 10.6 g/dL — ABNORMAL LOW (ref 13.0–17.0)

## 2017-11-06 MED ORDER — EPOETIN ALFA 10000 UNIT/ML IJ SOLN
10000.0000 [IU] | INTRAMUSCULAR | Status: DC
  Administered 2017-11-06: 10000 [IU] via SUBCUTANEOUS

## 2017-11-06 MED ORDER — EPOETIN ALFA 10000 UNIT/ML IJ SOLN
INTRAMUSCULAR | Status: AC
  Filled 2017-11-06: qty 1

## 2017-11-07 LAB — HEPATITIS B SURFACE ANTIGEN: Hepatitis B Surface Ag: NEGATIVE

## 2017-12-03 ENCOUNTER — Other Ambulatory Visit (HOSPITAL_COMMUNITY): Payer: Self-pay | Admitting: *Deleted

## 2017-12-04 ENCOUNTER — Encounter (HOSPITAL_COMMUNITY)
Admission: RE | Admit: 2017-12-04 | Discharge: 2017-12-04 | Disposition: A | Discharge status: Home / Self Care | Payer: BLUE CROSS/BLUE SHIELD | Source: Ambulatory Visit | Attending: Nephrology | Admitting: Nephrology

## 2017-12-04 VITALS — BP 131/69 | HR 78 | Temp 98.3°F | Resp 18

## 2017-12-04 DIAGNOSIS — N184 Chronic kidney disease, stage 4 (severe): Secondary | ICD-10-CM | POA: Insufficient documentation

## 2017-12-04 DIAGNOSIS — N185 Chronic kidney disease, stage 5: Secondary | ICD-10-CM

## 2017-12-04 LAB — POCT HEMOGLOBIN-HEMACUE: Hemoglobin: 10.3 g/dL — ABNORMAL LOW (ref 13.0–17.0)

## 2017-12-04 MED ORDER — EPOETIN ALFA 10000 UNIT/ML IJ SOLN
10000.0000 [IU] | INTRAMUSCULAR | Status: DC
  Administered 2017-12-04: 10000 [IU] via SUBCUTANEOUS

## 2017-12-04 MED ORDER — EPOETIN ALFA 10000 UNIT/ML IJ SOLN
INTRAMUSCULAR | Status: AC
  Administered 2017-12-04: 10000 [IU] via SUBCUTANEOUS
  Filled 2017-12-04: qty 1

## 2018-01-01 ENCOUNTER — Encounter (HOSPITAL_COMMUNITY): Payer: BLUE CROSS/BLUE SHIELD

## 2018-01-02 ENCOUNTER — Emergency Department (HOSPITAL_COMMUNITY): Payer: BLUE CROSS/BLUE SHIELD

## 2018-01-02 ENCOUNTER — Other Ambulatory Visit: Payer: Self-pay

## 2018-01-02 ENCOUNTER — Encounter (HOSPITAL_COMMUNITY): Payer: Self-pay

## 2018-01-02 ENCOUNTER — Encounter (HOSPITAL_COMMUNITY)
Admission: RE | Admit: 2018-01-02 | Discharge: 2018-01-02 | Disposition: A | Discharge status: Home / Self Care | Payer: BLUE CROSS/BLUE SHIELD | Source: Ambulatory Visit | Attending: Nephrology | Admitting: Nephrology

## 2018-01-02 ENCOUNTER — Inpatient Hospital Stay (HOSPITAL_COMMUNITY)
Admission: EM | Admit: 2018-01-02 | Discharge: 2018-01-04 | DRG: 380 | Disposition: A | Discharge status: Home / Self Care | Payer: BLUE CROSS/BLUE SHIELD | Attending: Internal Medicine | Admitting: Internal Medicine

## 2018-01-02 VITALS — BP 117/63 | HR 87 | Temp 97.6°F | Resp 18

## 2018-01-02 DIAGNOSIS — Z992 Dependence on renal dialysis: Secondary | ICD-10-CM | POA: Diagnosis not present

## 2018-01-02 DIAGNOSIS — Z91041 Radiographic dye allergy status: Secondary | ICD-10-CM | POA: Diagnosis not present

## 2018-01-02 DIAGNOSIS — Z79899 Other long term (current) drug therapy: Secondary | ICD-10-CM | POA: Diagnosis not present

## 2018-01-02 DIAGNOSIS — I120 Hypertensive chronic kidney disease with stage 5 chronic kidney disease or end stage renal disease: Secondary | ICD-10-CM | POA: Diagnosis present

## 2018-01-02 DIAGNOSIS — K222 Esophageal obstruction: Secondary | ICD-10-CM | POA: Diagnosis present

## 2018-01-02 DIAGNOSIS — D62 Acute posthemorrhagic anemia: Secondary | ICD-10-CM | POA: Diagnosis present

## 2018-01-02 DIAGNOSIS — G4733 Obstructive sleep apnea (adult) (pediatric): Secondary | ICD-10-CM | POA: Diagnosis present

## 2018-01-02 DIAGNOSIS — D649 Anemia, unspecified: Secondary | ICD-10-CM | POA: Diagnosis not present

## 2018-01-02 DIAGNOSIS — N184 Chronic kidney disease, stage 4 (severe): Secondary | ICD-10-CM | POA: Insufficient documentation

## 2018-01-02 DIAGNOSIS — I10 Essential (primary) hypertension: Secondary | ICD-10-CM | POA: Diagnosis present

## 2018-01-02 DIAGNOSIS — K449 Diaphragmatic hernia without obstruction or gangrene: Secondary | ICD-10-CM | POA: Diagnosis present

## 2018-01-02 DIAGNOSIS — K2100 Gastro-esophageal reflux disease with esophagitis, without bleeding: Secondary | ICD-10-CM

## 2018-01-02 DIAGNOSIS — E872 Acidosis: Secondary | ICD-10-CM | POA: Diagnosis present

## 2018-01-02 DIAGNOSIS — Z905 Acquired absence of kidney: Secondary | ICD-10-CM | POA: Diagnosis not present

## 2018-01-02 DIAGNOSIS — Z87442 Personal history of urinary calculi: Secondary | ICD-10-CM

## 2018-01-02 DIAGNOSIS — K2211 Ulcer of esophagus with bleeding: Secondary | ICD-10-CM | POA: Diagnosis present

## 2018-01-02 DIAGNOSIS — N185 Chronic kidney disease, stage 5: Secondary | ICD-10-CM | POA: Diagnosis not present

## 2018-01-02 DIAGNOSIS — E785 Hyperlipidemia, unspecified: Secondary | ICD-10-CM | POA: Diagnosis present

## 2018-01-02 DIAGNOSIS — Z7682 Awaiting organ transplant status: Secondary | ICD-10-CM

## 2018-01-02 DIAGNOSIS — N2581 Secondary hyperparathyroidism of renal origin: Secondary | ICD-10-CM | POA: Diagnosis present

## 2018-01-02 DIAGNOSIS — Z8719 Personal history of other diseases of the digestive system: Secondary | ICD-10-CM | POA: Diagnosis not present

## 2018-01-02 DIAGNOSIS — Q613 Polycystic kidney, unspecified: Secondary | ICD-10-CM | POA: Diagnosis not present

## 2018-01-02 DIAGNOSIS — D631 Anemia in chronic kidney disease: Secondary | ICD-10-CM | POA: Diagnosis present

## 2018-01-02 DIAGNOSIS — K922 Gastrointestinal hemorrhage, unspecified: Secondary | ICD-10-CM | POA: Diagnosis present

## 2018-01-02 DIAGNOSIS — M109 Gout, unspecified: Secondary | ICD-10-CM | POA: Diagnosis present

## 2018-01-02 DIAGNOSIS — K219 Gastro-esophageal reflux disease without esophagitis: Secondary | ICD-10-CM | POA: Diagnosis present

## 2018-01-02 DIAGNOSIS — N186 End stage renal disease: Secondary | ICD-10-CM | POA: Diagnosis present

## 2018-01-02 DIAGNOSIS — Z85528 Personal history of other malignant neoplasm of kidney: Secondary | ICD-10-CM | POA: Diagnosis not present

## 2018-01-02 DIAGNOSIS — Z1211 Encounter for screening for malignant neoplasm of colon: Secondary | ICD-10-CM

## 2018-01-02 LAB — RENAL FUNCTION PANEL
ALBUMIN: 3.1 g/dL — AB (ref 3.5–5.0)
Anion gap: 13 (ref 5–15)
BUN: 137 mg/dL — ABNORMAL HIGH (ref 6–20)
CALCIUM: 9.3 mg/dL (ref 8.9–10.3)
CO2: 13 mmol/L — AB (ref 22–32)
CREATININE: 7.4 mg/dL — AB (ref 0.61–1.24)
Chloride: 113 mmol/L — ABNORMAL HIGH (ref 101–111)
GFR calc non Af Amer: 7 mL/min — ABNORMAL LOW (ref >60)
GFR, EST AFRICAN AMERICAN: 8 mL/min — AB (ref >60)
Glucose, Bld: 113 mg/dL — ABNORMAL HIGH (ref 65–99)
PHOSPHORUS: 6 mg/dL — AB (ref 2.5–4.6)
Potassium: 4.8 mmol/L (ref 3.5–5.1)
SODIUM: 139 mmol/L (ref 135–145)

## 2018-01-02 LAB — POC OCCULT BLOOD, ED: FECAL OCCULT BLD: POSITIVE — AB

## 2018-01-02 LAB — COMPREHENSIVE METABOLIC PANEL
ALBUMIN: 3.4 g/dL — AB (ref 3.5–5.0)
ALK PHOS: 222 U/L — AB (ref 38–126)
ALT: 10 U/L — ABNORMAL LOW (ref 17–63)
ANION GAP: 12 (ref 5–15)
AST: 11 U/L — ABNORMAL LOW (ref 15–41)
BUN: 140 mg/dL — ABNORMAL HIGH (ref 6–20)
CO2: 13 mmol/L — AB (ref 22–32)
Calcium: 9.4 mg/dL (ref 8.9–10.3)
Chloride: 113 mmol/L — ABNORMAL HIGH (ref 101–111)
Creatinine, Ser: 7.49 mg/dL — ABNORMAL HIGH (ref 0.61–1.24)
GFR calc Af Amer: 8 mL/min — ABNORMAL LOW (ref >60)
GFR calc non Af Amer: 7 mL/min — ABNORMAL LOW (ref >60)
GLUCOSE: 103 mg/dL — AB (ref 65–99)
Potassium: 4.8 mmol/L (ref 3.5–5.1)
SODIUM: 138 mmol/L (ref 135–145)
Total Bilirubin: 0.3 mg/dL (ref 0.3–1.2)
Total Protein: 6.1 g/dL — ABNORMAL LOW (ref 6.5–8.1)

## 2018-01-02 LAB — ABO/RH: ABO/RH(D): O POS

## 2018-01-02 LAB — IRON AND TIBC
Iron: 100 ug/dL (ref 45–182)
Saturation Ratios: 33 % (ref 17.9–39.5)
TIBC: 300 ug/dL (ref 250–450)
UIBC: 200 ug/dL

## 2018-01-02 LAB — CBC
HEMATOCRIT: 18.1 % — AB (ref 39.0–52.0)
HEMOGLOBIN: 6 g/dL — AB (ref 13.0–17.0)
MCH: 29.9 pg (ref 26.0–34.0)
MCHC: 33.1 g/dL (ref 30.0–36.0)
MCV: 90 fL (ref 78.0–100.0)
Platelets: 180 10*3/uL (ref 150–400)
RBC: 2.01 MIL/uL — ABNORMAL LOW (ref 4.22–5.81)
RDW: 14.3 % (ref 11.5–15.5)
WBC: 8.6 10*3/uL (ref 4.0–10.5)

## 2018-01-02 LAB — FERRITIN: FERRITIN: 192 ng/mL (ref 24–336)

## 2018-01-02 LAB — POCT HEMOGLOBIN-HEMACUE: HEMOGLOBIN: 5.7 g/dL — AB (ref 13.0–17.0)

## 2018-01-02 LAB — PREPARE RBC (CROSSMATCH)

## 2018-01-02 MED ORDER — SODIUM BICARBONATE 650 MG PO TABS
1300.0000 mg | ORAL_TABLET | Freq: Two times a day (BID) | ORAL | Status: DC
  Administered 2018-01-02 – 2018-01-04 (×4): 1300 mg via ORAL
  Filled 2018-01-02 (×6): qty 2

## 2018-01-02 MED ORDER — PANTOPRAZOLE SODIUM 40 MG IV SOLR
40.0000 mg | INTRAVENOUS | Status: DC
  Administered 2018-01-02 – 2018-01-03 (×2): 40 mg via INTRAVENOUS
  Filled 2018-01-02 (×2): qty 40

## 2018-01-02 MED ORDER — GENTAMICIN SULFATE 0.1 % EX CREA
TOPICAL_CREAM | Freq: Three times a day (TID) | CUTANEOUS | Status: DC
  Administered 2018-01-03 (×2): via TOPICAL
  Filled 2018-01-02 (×2): qty 15

## 2018-01-02 MED ORDER — SODIUM CHLORIDE 0.9 % IV SOLN
Freq: Once | INTRAVENOUS | Status: AC
  Administered 2018-01-02: 14:00:00 via INTRAVENOUS

## 2018-01-02 MED ORDER — IPRATROPIUM BROMIDE 0.06 % NA SOLN
1.0000 | Freq: Three times a day (TID) | NASAL | Status: DC
  Administered 2018-01-02 – 2018-01-03 (×2): 1 via NASAL
  Filled 2018-01-02: qty 15

## 2018-01-02 MED ORDER — ALLOPURINOL 100 MG PO TABS
100.0000 mg | ORAL_TABLET | Freq: Every day | ORAL | Status: DC
  Administered 2018-01-02 – 2018-01-04 (×3): 100 mg via ORAL
  Filled 2018-01-02 (×5): qty 1

## 2018-01-02 MED ORDER — ACETAMINOPHEN 650 MG RE SUPP: 650.0000 mg | Freq: Four times a day (QID) | RECTAL | Status: DC | PRN

## 2018-01-02 MED ORDER — AMLODIPINE BESYLATE 10 MG PO TABS
10.0000 mg | ORAL_TABLET | Freq: Every morning | ORAL | Status: DC
  Administered 2018-01-03 – 2018-01-04 (×2): 10 mg via ORAL
  Filled 2018-01-02 (×3): qty 1

## 2018-01-02 MED ORDER — SODIUM CHLORIDE 0.9 % IV SOLN
500.0000 mL | INTRAVENOUS | Status: DC
  Administered 2018-01-02 – 2018-01-03 (×2): 500 mL via INTRAVENOUS

## 2018-01-02 MED ORDER — FUROSEMIDE 10 MG/ML IJ SOLN
80.0000 mg | Freq: Once | INTRAMUSCULAR | Status: AC
  Administered 2018-01-02: 80 mg via INTRAVENOUS
  Filled 2018-01-02: qty 8

## 2018-01-02 MED ORDER — FLUTICASONE PROPIONATE 50 MCG/ACT NA SUSP
1.0000 | Freq: Two times a day (BID) | NASAL | Status: DC | PRN
  Filled 2018-01-02: qty 16

## 2018-01-02 MED ORDER — LANTHANUM CARBONATE 500 MG PO CHEW
1000.0000 mg | CHEWABLE_TABLET | Freq: Three times a day (TID) | ORAL | Status: DC
  Administered 2018-01-03 – 2018-01-04 (×4): 1000 mg via ORAL
  Filled 2018-01-02 (×7): qty 2

## 2018-01-02 MED ORDER — ACETAMINOPHEN 325 MG PO TABS: 650.0000 mg | ORAL_TABLET | Freq: Four times a day (QID) | ORAL | Status: DC | PRN

## 2018-01-02 MED ORDER — AZELASTINE HCL 0.1 % NA SOLN
2.0000 | Freq: Two times a day (BID) | NASAL | Status: DC | PRN
  Filled 2018-01-02: qty 30

## 2018-01-02 MED ORDER — EPOETIN ALFA 10000 UNIT/ML IJ SOLN: 10000.0000 [IU] | INTRAMUSCULAR | Status: DC

## 2018-01-02 MED ORDER — HYDROCODONE-ACETAMINOPHEN 5-325 MG PO TABS
1.0000 | ORAL_TABLET | ORAL | Status: DC | PRN
  Administered 2018-01-03: 1 via ORAL
  Filled 2018-01-02: qty 1

## 2018-01-02 MED ORDER — FUROSEMIDE 40 MG PO TABS
40.0000 mg | ORAL_TABLET | Freq: Two times a day (BID) | ORAL | Status: DC
  Administered 2018-01-03 – 2018-01-04 (×3): 40 mg via ORAL
  Filled 2018-01-02 (×5): qty 1

## 2018-01-02 NOTE — ED Triage Notes (Addendum)
Pt came down from Medical day care was scheduled to have iron transfusion this morning and he was found to have hgb of 5.7. Pt is a kidney patient will be starting PD soon. Pt alert and oriented in triage, states he feels weak. Pt reports he did bowel prep on Monday and noted his stool was consistently dark.

## 2018-01-02 NOTE — ED Notes (Signed)
Portable xray at bedside.

## 2018-01-02 NOTE — ED Notes (Signed)
Clear Liquid Dinner Ordered @ 1635-per Lovena Le, RN-called by Levada Dy

## 2018-01-02 NOTE — Progress Notes (Addendum)
Hgb 5.7 via hemocue.  Pt reports peritoneal dialysis cath placed on Tuesday 4/9 with bleeding at site insertion, denies chest pain or SOB, reports fatique/weakness. MD notified  1015: take pt to ED, pt transported to ED via wheelchair

## 2018-01-02 NOTE — Consult Note (Addendum)
Consultation  Referring Provider: ER MD Venora Maples  MD Primary Care Physician:  [name], MD Primary Gastroenterologist:  Dr.Gessner  Reason for Consultation:  Melena, anemia  HPI: Phillip Blair is a 62 y.o. male, known to Dr. Carlean Purl from previous colonoscopies.  He was sent to the ER today after outpatient labs showed a hemoglobin of 5.7.  He had been complaining of generalized weakness and increasing fatigue over the past 5-7 days. Patient has chronic kidney disease, he is status post nephrectomy about 3 years ago with history of polycystic kidney disease.  He is currently on transplant list at [facility].  He has a dialysis graft in place, but has not started dialysis as yet. He just underwent peritoneal dialysis catheter placement on Tuesday of this week at [facility].  Preprocedure he did a bowel prep on Monday for 04/2018.  He noticed with the prep that he passed a lot of black tarry appearing stool.  He had not noticed any black stool prior to doing the prep or any bright red blood per rectum. He is not had any previous upper GI issues and no prior EGDs.  His last bowel movement was on Tuesday, 12/31/2017.  He says that was still black. He has not been on any blood thinners, no aspirin or NSAIDs.  He has been noticing some recent increase in heartburn and indigestion and has had some vague dysphasia intermittently.  He has no complaints of abdominal pain prior to the dialysis catheter placement.  He has been very sore since then and has had a lot of leakage from the surgical site.  He has had some nausea but no vomiting. His baseline hemoglobin is about 10.5. Iron studies were done today and within normal limits, ferritin 192, BUN is up to 140 today creatinine 7.49 Also note alk phos of 222. He had CT scan of the abdomen pelvis done in the ER today to rule out bowel perforation or bowel injury post peritoneal dialysis catheter placement.  He was noted to have multiple  complicated renal cysts, dialysis catheter in the right lower quadrant, there is no evidence of hematoma or bowel perforation.  He was noted to have a lytic appearing lesion in the right femoral neck.    Patient is chronically on any H2 blocker or PPI. Last colonoscopy was done in November 2018 with finding of one 5 mm polyp in the rectum which was a tubular adenoma, also noted to have scattered large mouth diverticuli throughout the colon.  Past Medical History:  Diagnosis Date  . Allergy   . Anemia    low iron  . Cancer ([place])    kidney  . Chronic kidney disease    denies  . Family history of kidney stone   . GERD (gastroesophageal reflux disease)    occ  . Gout   . Hypercalcemia   . Hyperlipidemia   . Hypertension    does not see a cardiologist  . Parathyroid adenoma   . Sleep apnea    no longer uses cpap, has lost weight    Past Surgical History:  Procedure Laterality Date  . AV FISTULA PLACEMENT Left 03/30/2016   Procedure: FIRST STAGE BASILIC VEIN TRANSPOSITION LEFT UPPER ARM;  Surgeon: Serafina Mitchell, MD;  Location: [place];  Service: Vascular;  Laterality: Left;  . BASCILIC VEIN TRANSPOSITION Left 06/13/2016   Procedure: SECOND STAGE BASILIC VEIN TRANSPOSITION;  Surgeon: Serafina Mitchell, MD;  Location: [place];  Service: Vascular;  Laterality: Left;  . COLONOSCOPY    . MINIMALLY INVASIVE RADIOACTIVE PARATHYROIDECTOMY N/A 05/04/2013   Procedure: PARATHYROIDECTOMY MINIMALLY INVASIVE;  Surgeon: Harl Bowie, MD;  Location: [place];  Service: General;  Laterality: N/A;  . MINIMALLY INVASIVE RADIOACTIVE PARATHYROIDECTOMY N/A 10/20/2013   Procedure: MINIMALLY INVASIVE PARATHYROIDECTOMY CONVERTED TO COMPLETE NECK EXPLORATION;  Surgeon: Harl Bowie, MD;  Location: [place];  Service: General;  Laterality: N/A;  . NASAL SEPTOPLASTY W/ TURBINOPLASTY    . ROBOT ASSISTED LAPAROSCOPIC NEPHRECTOMY Left 01/19/2015   Procedure: ROBOTIC ASSISTED LAPAROSCOPIC RADICAL NEPHRECTOMY AND  INTRAOPERATIVE ULTRASOUND ;  Surgeon: Alexis Frock, MD;  Location: [facility];  Service: Urology;  Laterality: Left;  Marland Kitchen VASECTOMY      Prior to Admission medications   Medication Sig Start Date End Date Taking? Authorizing Provider  allopurinol (ZYLOPRIM) 100 MG tablet Take 100 mg by mouth daily. 02/22/16   [provider]  amLODipine (NORVASC) 10 MG tablet Take 10 mg by mouth every morning.     [provider]  Azelastine HCl 0.15 % SOLN Place 2 Squirts into the nose 2 (two) times daily. 04/09/16   [provider]  cinacalcet (SENSIPAR) 30 MG tablet Take 30 mg by mouth daily.    [provider]  epoetin alfa (EPOGEN,PROCRIT) 18841 UNIT/ML injection 10,000 Units every 30 (thirty) days.    [provider]  furosemide (LASIX) 40 MG tablet Take 40 mg by mouth 2 (two) times daily. 05/28/15   [provider]  lanthanum (FOSRENOL) 1000 MG chewable tablet Chew 1,000 mg by mouth 2 (two) times daily with a meal.    [provider]  sodium bicarbonate 650 MG tablet Take 650 mg by mouth 2 (two) times daily. 04/29/15   [provider]    Current Facility-Administered Medications  Medication Dose Route Frequency Provider Last Rate Last Dose  . 0.9 %  sodium chloride infusion  500 mL Intravenous Continuous [name], MD       Current Outpatient Medications  Medication Sig Dispense Refill  . allopurinol (ZYLOPRIM) 100 MG tablet Take 100 mg by mouth daily.    Marland Kitchen amLODipine (NORVASC) 10 MG tablet Take 10 mg by mouth every morning.     . Azelastine HCl 0.15 % SOLN Place 2 Squirts into the nose 2 (two) times daily.    . cinacalcet (SENSIPAR) 30 MG tablet Take 30 mg by mouth daily.    Marland Kitchen epoetin alfa (EPOGEN,PROCRIT) 66063 UNIT/ML injection 10,000 Units every 30 (thirty) days.    . furosemide (LASIX) 40 MG tablet Take 40 mg by mouth 2 (two) times daily.  5  . lanthanum (FOSRENOL) 1000 MG chewable tablet Chew 1,000 mg by mouth 2 (two) times  daily with a meal.    . sodium bicarbonate 650 MG tablet Take 650 mg by mouth 2 (two) times daily.  6   Facility-Administered Medications Ordered in Other Encounters  Medication Dose Route Frequency Provider Last Rate Last Dose  . epoetin alfa (EPOGEN,PROCRIT) injection 10,000 Units  10,000 Units Subcutaneous Q28 days Donato Heinz, MD        Allergies as of 01/02/2018 - Review Complete 01/02/2018  Allergen Reaction Noted  . Ivp dye [iodinated diagnostic agents] Itching and Other (See Comments) 01/10/2015    Family History  Problem Relation Age of Onset  . Colon cancer Neg Hx     Social History   Socioeconomic History  . Marital status: Married    Spouse name: Not on file  .  Number of children: Not on file  . Years of education: Not on file  . Highest education level: Not on file  Occupational History  . Not on file  Social Needs  . Financial resource strain: Not on file  . Food insecurity:    Worry: Not on file    Inability: Not on file  . Transportation needs:    Medical: Not on file    Non-medical: Not on file  Tobacco Use  . Smoking status: Never Smoker  . Smokeless tobacco: Never Used  Substance and Sexual Activity  . Alcohol use: Yes    Alcohol/week: 1.2 oz    Types: 2 Glasses of wine per week    Comment: occasional  . Drug use: No  . Sexual activity: Not on file  Lifestyle  . Physical activity:    Days per week: Not on file    Minutes per session: Not on file  . Stress: Not on file  Relationships  . Social connections:    Talks on phone: Not on file    Gets together: Not on file    Attends religious service: Not on file    Active member of club or organization: Not on file    Attends meetings of clubs or organizations: Not on file    Relationship status: Not on file  . Intimate partner violence:    Fear of current or ex partner: Not on file    Emotionally abused: Not on file    Physically abused: Not on file    Forced sexual activity: Not on  file  Other Topics Concern  . Not on file  Social History Narrative  . Not on file    Review of Systems: Pertinent positive and negative review of systems were noted in the above HPI section.  All other review of systems was otherwise negative.Marland Kitchen  Physical Exam: Vital signs in last 24 hours: Temp:  [97.6 F (36.4 C)-98.6 F (37 C)] 97.6 F (36.4 C) (04/11 1404) Pulse Rate:  [82-93] 86 (04/11 1404) Resp:  [17-18] 17 (04/11 1404) BP: (111-143)/(63-84) 125/66 (04/11 1404) SpO2:  [97 %-100 %] 97 % (04/11 1404)   General:   Alert,  Well-developed, well-nourished, pleasant and cooperative in NAD, family at bedside Head:  Normocephalic and atraumatic. Eyes:  Sclera clear, no icterus.   Conjunctiva pale. Ears:  Normal auditory acuity. Nose:  No deformity, discharge,  or lesions. Mouth:  No deformity or lesions.   Neck:  Supple; no masses or thyromegaly. Lungs:  Clear throughout to auscultation.   No wheezes, crackles, or rhonchi. Heart:  Regular rate and rhythm; no murmurs, clicks, rubs,  or gallops. Abdomen:  Soft,tender mid and lower abdomen, incisional site covered, he is weeping serous appearing fluid, there is some erythema and mild induration of the lower abdominal wall, BS active,nonpalp mass or hsm.   Rectal:  Deferred  Msk:  Symmetrical without gross deformities. . Pulses:  Normal pulses noted. Extremities:  Without clubbing or edema. Neurologic:  Alert and  oriented x4;  grossly normal neurologically. Skin:  Intact without significant lesions or rashes.. Psych:  Alert and cooperative. Normal mood and affect.  Intake/Output from previous day: No intake/output data recorded. Intake/Output this shift: No intake/output data recorded.  Lab Results: Recent Labs    01/02/18 0948 01/02/18 1128  WBC  --  8.6  HGB 5.7* 6.0*  HCT  --  18.1*  PLT  --  180   BMET Recent Labs    01/02/18  3382 01/02/18 1128  NA 139 138  K 4.8 4.8  CL 113* 113*  CO2 13* 13*  GLUCOSE  113* 103*  BUN 137* 140*  CREATININE 7.40* 7.49*  CALCIUM 9.3 9.4   LFT Recent Labs    01/02/18 1128  PROT 6.1*  ALBUMIN 3.4*  AST 11*  ALT 10*  ALKPHOS 222*  BILITOT 0.3   PT/INR No results for input(s): LABPROT, INR in the last 72 hours. Hepatitis Panel No results for input(s): HEPBSAG, HCVAB, HEPAIGM, HEPBIGM in the last 72 hours.   IMPRESSION:  #29 62 year old African-American male admitted with profound weakness and fatigue over the past 5-7 days and finding of hemoglobin of 5.7 on an outpatient lab check. Patient reports large amount of black stool with his bowel prep on Monday, 12/30/2017, and one black stool the following day.  He has not had any bowel movement since. No prior history of upper GI issues or any prior GI bleed. He has had a 4 g drop in his hemoglobin over the past couple of weeks.  Suspect upper GI bleeding, rule out esophagitis, gastropathy or peptic ulcer disease. Patient may have also had some blood loss with his peritoneal dialysis catheter placement earlier this week.  #2 chronic kidney disease/renal failure.  Patient is status post nephrectomy for polycystic kidney disease about 3 years ago.  He is currently on the transplant list at [facility]. Not currently dialysis dependent Peritoneal dialysis catheter placed Tuesday, 12/31/2017  #3 history of adenomatous colon polyps-up-to-date with colonoscopy just done November 2018 For diverticulosis #5 hypertension #6.  Hyperlipidemia #7.  History of parathyroidectomy for adenoma #8 lytic lesion in the right femoral head noted on CT scan done earlier today-will need further workup #9 chronic anemia- secondary to chronic kidney disease  PLAN: Patient is receiving 2 units of packed RBCs today, transfuse to keep hemoglobin above 7 Will start low-dose IV PPI Clear liquids today, n.p.o. after midnight Patient has been scheduled for upper endoscopy with Dr. Lyndel Safe tomorrow morning.  Procedure was discussed in  detail with the patient including indications risks and benefits and he is agreeable to proceed.  Discussed question of need for antibiotics preprocedure given placement of peritoneal dialysis catheter earlier this week with Dr. Arty Baumgartner his nephrologist.  He does not feel abx necessary.   Amy Esterwood  01/02/2018, 3:20 PM   Attending physician's note   I have taken an interval history, reviewed the chart and examined the patient. I agree with the Advanced Practitioner's note, impression and recommendations.  Discussed in detail with patient and patient's family. 62 year old with melena, admission hemoglobin 5.7.  Patient with chronic renal failure due to PCKD, on transplant list at [facility], status post peritoneal dialysis catheter placement 12/31/2017, last colonoscopy November 2018. Plan-proceed with EGD in a.m.,  IV low-dose Protonix if okay with nephrology, agree with transfusion, trend CBC, may need hemodialysis or peritoneal dialysis during this admission.  Carmell Austria, MD

## 2018-01-02 NOTE — ED Provider Notes (Signed)
[facility] EMERGENCY DEPARTMENT Provider Note   CSN: [id number] Arrival date & time: 01/02/18  1019     History   Chief Complaint Chief Complaint  Patient presents with  . Abnormal Lab    HPI Phillip Blair is a 62 y.o. male presenting for evaluation of generalized weakness.  Patient states he was sent to the ER because his blood levels were low at 5.7.  He reports generalized weakness and tiredness for the past 5-7 days.  He recently had peritoneal dialysis catheter placement on Tuesday at [facility] for chronic kidney disease.  He denies significant pain since the surgery.  He states the nurse noted some leaking from 1 of the sites on his abdomen yesterday.  He did a bowel prep for the surgery, and since then has been having close to black stools.  He denies fevers, chest pain, shortness of breath, nausea, vomiting, or any urinary symptoms.  He denies dizziness, vision changes, or slurred speech. He has had no change in his medications recently.   HPI  Past Medical History:  Diagnosis Date  . Allergy   . Anemia    low iron  . Cancer ([place])    kidney  . Chronic kidney disease    denies  . Family history of kidney stone   . GERD (gastroesophageal reflux disease)    occ  . Gout   . Hypercalcemia   . Hyperlipidemia   . Hypertension    does not see a cardiologist  . Parathyroid adenoma   . Sleep apnea    no longer uses cpap, has lost weight    Patient Active Problem List   Diagnosis Date Noted  . Hx of adenomatous polyp of colon 07/30/2017  . Renal mass 01/19/2015  . Parathyroid adenoma 03/13/2013  . Pain in joint, shoulder region 02/25/2013  . Tendonitis of shoulder 02/25/2013  . Hypercalcemia 02/25/2013  . HLD (hyperlipidemia) 01/14/2013  . Chronic kidney disease, stage V ([place]) 08/11/2012  . SLEEP APNEA, OBSTRUCTIVE, SEVERE 06/15/2010  . HYPERSOMNIA, ASSOCIATED WITH SLEEP APNEA 05/11/2010  . GERD 10/28/2009  . GOUT 04/13/2008  .  HYPERLIPIDEMIA 05/02/2007  . HYPERTENSION 05/02/2007  . ALLERGIC RHINITIS 05/02/2007  . NEPHROLITHIASIS, HX OF 05/02/2007    Past Surgical History:  Procedure Laterality Date  . AV FISTULA PLACEMENT Left 03/30/2016   Procedure: FIRST STAGE BASILIC VEIN TRANSPOSITION LEFT UPPER ARM;  Surgeon: Serafina Mitchell, MD;  Location: [facility];  Service: Vascular;  Laterality: Left;  . BASCILIC VEIN TRANSPOSITION Left 06/13/2016   Procedure: SECOND STAGE BASILIC VEIN TRANSPOSITION;  Surgeon: Serafina Mitchell, MD;  Location: [place] [facility];  Service: Vascular;  Laterality: Left;  . COLONOSCOPY    . MINIMALLY INVASIVE RADIOACTIVE PARATHYROIDECTOMY N/A 05/04/2013   Procedure: PARATHYROIDECTOMY MINIMALLY INVASIVE;  Surgeon: Harl Bowie, MD;  Location: [place];  Service: General;  Laterality: N/A;  . MINIMALLY INVASIVE RADIOACTIVE PARATHYROIDECTOMY N/A 10/20/2013   Procedure: MINIMALLY INVASIVE PARATHYROIDECTOMY CONVERTED TO COMPLETE NECK EXPLORATION;  Surgeon: Harl Bowie, MD;  Location: [facility];  Service: General;  Laterality: N/A;  . NASAL SEPTOPLASTY W/ TURBINOPLASTY    . ROBOT ASSISTED LAPAROSCOPIC NEPHRECTOMY Left 01/19/2015   Procedure: ROBOTIC ASSISTED LAPAROSCOPIC RADICAL NEPHRECTOMY AND INTRAOPERATIVE ULTRASOUND ;  Surgeon: Alexis Frock, MD;  Location: [facility];  Service: Urology;  Laterality: Left;  Marland Kitchen VASECTOMY          Home Medications    Prior to Admission medications   Medication Sig Start Date End  Date Taking? Authorizing Provider  allopurinol (ZYLOPRIM) 100 MG tablet Take 100 mg by mouth daily. 02/22/16   [provider]  amLODipine (NORVASC) 10 MG tablet Take 10 mg by mouth every morning.     [provider]  Azelastine HCl 0.15 % SOLN Place 2 Squirts into the nose 2 (two) times daily. 04/09/16   [provider]  cinacalcet (SENSIPAR) 30 MG tablet Take 30 mg by mouth daily.    [provider]  epoetin alfa (EPOGEN,PROCRIT) 37902 UNIT/ML injection 10,000  Units every 30 (thirty) days.    [provider]  furosemide (LASIX) 40 MG tablet Take 40 mg by mouth 2 (two) times daily. 05/28/15   [provider]  lanthanum (FOSRENOL) 1000 MG chewable tablet Chew 1,000 mg by mouth 2 (two) times daily with a meal.    [provider]  sodium bicarbonate 650 MG tablet Take 650 mg by mouth 2 (two) times daily. 04/29/15   [provider]    Family History Family History  Problem Relation Age of Onset  . Colon cancer Neg Hx     Social History Social History   Tobacco Use  . Smoking status: Never Smoker  . Smokeless tobacco: Never Used  Substance Use Topics  . Alcohol use: Yes    Alcohol/week: 1.2 oz    Types: 2 Glasses of wine per week    Comment: occasional  . Drug use: No     Allergies   Ivp dye [iodinated diagnostic agents]   Review of Systems Review of Systems  Constitutional: Positive for chills (feels very cold). Negative for fever.  Gastrointestinal: Positive for abdominal pain (mild post-op pain) and blood in stool.  Neurological: Positive for weakness.  All other systems reviewed and are negative.    Physical Exam Updated Vital Signs BP 125/66   Pulse 86   Temp 97.6 F (36.4 C) (Oral)   Resp 17   SpO2 97%   Physical Exam  Constitutional: He is oriented to person, place, and time. He appears well-developed and well-nourished.  Pt appears dehydrated. Tremors- states they're from chills  HENT:  Head: Normocephalic and atraumatic.  Mouth/Throat: Mucous membranes are dry.  MM dry  Eyes: Pupils are equal, round, and reactive to light. Conjunctivae and EOM are normal.  Neck: Normal range of motion. Neck supple.  Cardiovascular: Normal rate, regular rhythm and intact distal pulses.  Pulmonary/Chest: Effort normal and breath sounds normal. No respiratory distress. He has no wheezes.  Abdominal: Soft. He exhibits no distension and no mass. There is no rebound and no guarding.  Erythema and  warmth of lower abd below surgical site. Incision sites without significant bleeding. No signs of purulent drainage. Clear drainage from umbilical site. PD cath in place. Mild TTP of suprapubic abd. BS normoactive x4. No rigidity, guarding, or distention.   Musculoskeletal: Normal range of motion.  Neurological: He is alert and oriented to person, place, and time.  Skin: Skin is warm and dry.  Psychiatric: He has a normal mood and affect.  Nursing note and vitals reviewed.    ED Treatments / Results  Labs (all labs ordered are listed, but only abnormal results are displayed) Labs Reviewed  COMPREHENSIVE METABOLIC PANEL - Abnormal; Notable for the following components:      Result Value   Chloride 113 (*)    CO2 13 (*)    Glucose, Bld 103 (*)    BUN 140 (*)    Creatinine, Ser 7.49 (*)  Total Protein 6.1 (*)    Albumin 3.4 (*)    AST 11 (*)    ALT 10 (*)    Alkaline Phosphatase 222 (*)    GFR calc non Af Amer 7 (*)    GFR calc Af Amer 8 (*)    All other components within normal limits  CBC - Abnormal; Notable for the following components:   RBC 2.01 (*)    Hemoglobin 6.0 (*)    HCT 18.1 (*)    All other components within normal limits  POC OCCULT BLOOD, ED - Abnormal; Notable for the following components:   Fecal Occult Bld POSITIVE (*)    All other components within normal limits  TYPE AND SCREEN  ABO/RH  PREPARE RBC (CROSSMATCH)    EKG None  Radiology Ct Abdomen Pelvis Wo Contrast  Result Date: 01/02/2018 CLINICAL DATA:  iron transfusion this morning and he was found to have hgb of 5.7. Pt is a kidney patient will be starting PD soon. Pt alert and oriented in triage, states he feels weak. Pt reports he did bowel prep on Monday and noted his stool was consistently dark, post op EXAM: CT ABDOMEN AND PELVIS WITHOUT CONTRAST TECHNIQUE: Multidetector CT imaging of the abdomen and pelvis was performed following the standard protocol without IV contrast. COMPARISON:   08/23/2016, 02/13/2016 and 10/26/2014. FINDINGS: Lower chest: Mild dependent atelectasis in the lower lobes. Bases otherwise clear. Hepatobiliary: No focal liver abnormality is seen. No gallstones, gallbladder wall thickening, or biliary dilatation. Pancreas: Unremarkable. No pancreatic ductal dilatation or surrounding inflammatory changes. Spleen: Normal in size without focal abnormality. Adrenals/Urinary Tract: No adrenal masses. Left kidney is been surgically removed. There are multiple right renal masses of varying attenuation consistent with a combination of simple and complicated cysts. Appearance is stable prior CT. No collecting system stones. No hydronephrosis. Right ureter normal in course and in caliber. Bladder is unremarkable. Stomach/Bowel: Stomach is within normal limits. Appendix appears normal. No evidence of bowel wall thickening, distention, or inflammatory changes. Vascular/Lymphatic: No significant vascular findings are present. No enlarged abdominal or pelvic lymph nodes. Reproductive: Prostate enlarged measuring 5.2 x 4.7 cm transversely. Other: Peritoneal dialysis catheter curls in the right lower quadrant. No abdominal wall hernia. No ascites. Musculoskeletal: No fracture or acute finding multiple Schmorl's nodes noted along the lumbar spine. Ill-defined lucent lesion noted in the right femoral neck. This is new since the CT dated 10/26/2014. This may reflect a brown tumor. It could reflect a plasmacytoma. IMPRESSION: 1. No acute abnormalities within the abdomen or pelvis. No evidence of hemorrhage. 2. Multiple right renal masses consistent with cysts and complicated cysts, unchanged from the most recent prior CT. 3. Lytic lesion in the right femoral neck. This is new since a CT scan dated 10/26/2014. Differential diagnosis includes a brown tumor, plasmacytoma or other lytic neoplastic lesion. 4. Peritoneal dialysis catheter curls in the right mid quadrant. Electronically Signed   By: Lajean Manes M.D.   On: 01/02/2018 14:41    Procedures .Critical Care Performed by: Franchot Heidelberg, PA-C Authorized by: Franchot Heidelberg, PA-C   Critical care provider statement:    Critical care time (minutes):  40   Critical care time was exclusive of:  Separately billable procedures and treating other patients and teaching time   Critical care was necessary to treat or prevent imminent or life-threatening deterioration of the following conditions:  Circulatory failure   Critical care was time spent personally by me on the following activities:  Development  of treatment plan with patient or surrogate, discussions with consultants, evaluation of patient's response to treatment, examination of patient, review of old charts, re-evaluation of patient's condition, pulse oximetry, ordering and review of radiographic studies, ordering and review of laboratory studies and ordering and performing treatments and interventions   I assumed direction of critical care for this patient from another provider in my specialty: no   Comments:     Pt with critically low hgb at 6, emergent blood transfusion given   (including critical care time)  Medications Ordered in ED Medications  0.9 %  sodium chloride infusion ( Intravenous New Bag/Given 01/02/18 1348)     Initial Impression / Assessment and Plan / ED Course  I have reviewed the triage vital signs and the nursing notes.  Pertinent labs & imaging results that were available during my care of the patient were reviewed by me and considered in my medical decision making (see chart for details).     Patient presenting for evaluation of symptomatic anemia.  Hemoglobin was found to be 5.7 this morning.  Patient reports a week of tiredness and weakness.  Physical exam reassuring, blood pressure and heart rate stable.  Patient is stable.  He appears dehydrated.  Recent surgery for PD cath placement and hernia repair.  Since then, patient has had black stools.   Concern for possible bowel injury during surgery.  Will obtain labs and CT abdomen for further evaluation. abd erythematous and warm, ?infection. Not currently on abx. Will finish workup before starting abx.  Labs show hemoglobin is 6.  2 units of blood ordered.  Kidney function patient's baseline.  Alk phos elevated, h/o similar.   CT abdomen does not show any sign of perforation or bowel injury.  No free air or free fluid. Shows new lesion in R femur- did not discuss with pt. Will consult with GI for GI bleed and anemia. Will call hospitalist for admission.   Discussed with [name] from [facility] who will follow the patient while he is in the hospital.  Discussed with hospitalist, requests EKG and chest x-ray to assess patient's volume status.  Patient to be admitted.  Final Clinical Impressions(s) / ED Diagnoses   Final diagnoses:  Gastrointestinal hemorrhage, unspecified gastrointestinal hemorrhage type  Symptomatic anemia    ED Discharge Orders    None       Franchot Heidelberg, PA-C 01/02/18 2143    Jola Schmidt, MD 01/02/18 2319

## 2018-01-02 NOTE — Consult Note (Signed)
Reason for Consult: CKD stage 5 and ABLA Referring Physician:  Venora Maples, MD  Phillip Blair is an 62 y.o. male.  HPI: Phillip Blair is a [name] with a past medical history significant for hypertension, nephrolithiasis and parathyroidectomy due to adenoma and a recent diagnosis of polycystic kidney disease. He has no family history of polycystic kidney disease on his dad's side and he has never been in touch with his mother, who they believe still lives in [place]. He was referred to our practice by Dr. Tresa Blair, his urologist, after he was evaluated for a large left renal mass that is approximately 9 cm and it was confirmed on an MRI in March 2016. It was confirmed to be Renal cell carcinoma and he underwent robotic nephrectomy and his creatinine rose from a baseline creatinine of 1.6 to 1.7 to 3.5 following surgery.  Unfortunately he has had progressive CKD over the last 3 years and is now stage 5 (see his trend in Scr below).  He is s/p PD catheter placement on 12/31/17 and was set up to start PD training later this month when he went to short stay for his Aranesp injection and was noted to have had a significant drop in his Hgb from 10.9 to 5.7.  He reports dark stools over the last week and had some bleeding at the PD exit site.  He also reported weakness and fatigue and was directed to the [facility] for further evaluation.  We were consulted to help further manage his CKD stage 5 not yet on renal replacement therapy.    He denies any anorexia, but has had some nausea without vomiting.  Other than feeling cold and weak, he was "okay".  He does have a functional LUE BVT that is mature and ready for use if needed.  Trend in Creatinine: Creatinine, Ser  Date/Time Value Ref Range Status  01/02/2018 11:28 AM 7.49 (H) 0.61 - 1.24 mg/dL Final  01/02/2018 09:39 AM 7.40 (H) 0.61 - 1.24 mg/dL Final  11/06/2017 10:13 AM 7.47 (H) 0.61 - 1.24 mg/dL Final  10/09/2017 09:22 AM 7.70 (H) 0.61 - 1.24 mg/dL Final   09/11/2017 09:18 AM 6.65 (H) 0.61 - 1.24 mg/dL Final  08/14/2017 09:05 AM 6.98 (H) 0.61 - 1.24 mg/dL Final  07/17/2017 09:20 AM 6.82 (H) 0.61 - 1.24 mg/dL Final  06/21/2017 08:48 AM 6.53 (H) 0.61 - 1.24 mg/dL Final  05/22/2017 09:15 AM 6.03 (H) 0.61 - 1.24 mg/dL Final  04/22/2017 09:46 AM 6.06 (H) 0.61 - 1.24 mg/dL Final  03/26/2017 09:00 AM 6.00 (H) 0.61 - 1.24 mg/dL Final  02/27/2017 09:11 AM 6.20 (H) 0.61 - 1.24 mg/dL Final  01/25/2017 10:56 AM 5.74 (H) 0.61 - 1.24 mg/dL Final  12/26/2016 09:00 AM 5.43 (H) 0.61 - 1.24 mg/dL Final  11/28/2016 08:59 AM 5.84 (H) 0.61 - 1.24 mg/dL Final  10/31/2016 09:13 AM 6.12 (H) 0.61 - 1.24 mg/dL Final  10/03/2016 09:30 AM 6.20 (H) 0.61 - 1.24 mg/dL Final  08/22/2016 09:00 AM 5.37 (H) 0.61 - 1.24 mg/dL Final  07/25/2016 09:06 AM 5.43 (H) 0.61 - 1.24 mg/dL Final  06/27/2016 11:49 AM 5.95 (H) 0.61 - 1.24 mg/dL Final  01/21/2015 04:50 AM 3.50 (H) 0.50 - 1.35 mg/dL Final  01/20/2015 05:25 AM 2.49 (H) 0.50 - 1.35 mg/dL Final  01/19/2015 03:37 PM 1.82 (H) 0.50 - 1.35 mg/dL Final  01/11/2015 02:58 PM 1.91 (H) 0.50 - 1.35 mg/dL Final  01/01/2014 10:18 AM 1.75 (H) 0.76 - 1.27 mg/dL Final  10/21/2013 05:39  AM 1.60 (H) 0.50 - 1.35 mg/dL Final  10/14/2013 08:35 AM 1.44 (H) 0.50 - 1.35 mg/dL Final  08/28/2013 09:54 AM 1.53 (H) 0.76 - 1.27 mg/dL Final  05/05/2013 04:55 AM 1.62 (H) 0.50 - 1.35 mg/dL Final  05/04/2013 03:09 PM 1.54 (H) 0.50 - 1.35 mg/dL Final  04/24/2013 09:31 AM 1.56 (H) 0.50 - 1.35 mg/dL Final  08/04/2012 08:39 AM 1.6 (H) 0.4 - 1.5 mg/dL Final  08/03/2011 08:34 AM 1.5 0.4 - 1.5 mg/dL Final  06/03/2009 08:52 AM 1.3 0.4 - 1.5 mg/dL Final  05/06/2007 09:17 AM 1.2 0.4 - 1.5 mg/dL Final    PMH:   Past Medical History:  Diagnosis Date  . Allergy   . Anemia    low iron  . Cancer ([place])    kidney  . Chronic kidney disease    denies  . Family history of kidney stone   . GERD (gastroesophageal reflux disease)    occ  . Gout   .  Hypercalcemia   . Hyperlipidemia   . Hypertension    does not see a cardiologist  . Parathyroid adenoma   . Sleep apnea    no longer uses cpap, has lost weight    PSH:   Past Surgical History:  Procedure Laterality Date  . AV FISTULA PLACEMENT Left 03/30/2016   Procedure: FIRST STAGE BASILIC VEIN TRANSPOSITION LEFT UPPER ARM;  Surgeon: Phillip Mitchell, MD;  Location: [place];  Service: Vascular;  Laterality: Left;  . BASCILIC VEIN TRANSPOSITION Left 06/13/2016   Procedure: SECOND STAGE BASILIC VEIN TRANSPOSITION;  Surgeon: Phillip Mitchell, MD;  Location: [place];  Service: Vascular;  Laterality: Left;  . COLONOSCOPY    . MINIMALLY INVASIVE RADIOACTIVE PARATHYROIDECTOMY N/A 05/04/2013   Procedure: PARATHYROIDECTOMY MINIMALLY INVASIVE;  Surgeon: Phillip Bowie, MD;  Location: [place];  Service: General;  Laterality: N/A;  . MINIMALLY INVASIVE RADIOACTIVE PARATHYROIDECTOMY N/A 10/20/2013   Procedure: MINIMALLY INVASIVE PARATHYROIDECTOMY CONVERTED TO COMPLETE NECK EXPLORATION;  Surgeon: Phillip Bowie, MD;  Location: [place];  Service: General;  Laterality: N/A;  . NASAL SEPTOPLASTY W/ TURBINOPLASTY    . ROBOT ASSISTED LAPAROSCOPIC NEPHRECTOMY Left 01/19/2015   Procedure: ROBOTIC ASSISTED LAPAROSCOPIC RADICAL NEPHRECTOMY AND INTRAOPERATIVE ULTRASOUND ;  Surgeon: Phillip Frock, MD;  Location: [facility];  Service: Urology;  Laterality: Left;  Marland Kitchen VASECTOMY      Allergies:  Allergies  Allergen Reactions  . Ivp Dye [Iodinated Diagnostic Agents] Itching and Other (See Comments)    Peeling of skin    Medications:   Prior to Admission medications   Medication Sig Start Date End Date Taking? Authorizing Provider  allopurinol (ZYLOPRIM) 100 MG tablet Take 100 mg by mouth daily. 02/22/16   [provider]  amLODipine (NORVASC) 10 MG tablet Take 10 mg by mouth every morning.     [provider]  Azelastine HCl 0.15 % SOLN Place 2 Squirts into the nose 2 (two) times daily. 04/09/16    [provider]  cinacalcet (SENSIPAR) 30 MG tablet Take 30 mg by mouth daily.    [provider]  epoetin alfa (EPOGEN,PROCRIT) 03474 UNIT/ML injection 10,000 Units every 30 (thirty) days.    [provider]  furosemide (LASIX) 40 MG tablet Take 40 mg by mouth 2 (two) times daily. 05/28/15   [provider]  lanthanum (FOSRENOL) 1000 MG chewable tablet Chew 1,000 mg by mouth 2 (two) times daily with a meal.    [provider]  sodium bicarbonate 650 MG tablet  Take 650 mg by mouth 2 (two) times daily. 04/29/15   [provider]    Inpatient medications:   Discontinued Meds:  There are no discontinued medications.  Social History:  reports that he has never smoked. He has never used smokeless tobacco. He reports that he drinks about 1.2 oz of alcohol per week. He reports that he does not use drugs.  Family History:   Family History  Problem Relation Age of Onset  . Colon cancer Neg Hx     Pertinent items are noted in HPI. Weight change:  No intake or output data in the 24 hours ending 01/02/18 1510 BP 125/66   Pulse 86   Temp 97.6 F (36.4 C) (Oral)   Resp 17   SpO2 97%  Vitals:   01/02/18 1315 01/02/18 1330 01/02/18 1348 01/02/18 1404  BP: 115/70 111/73 127/74 125/66  Pulse: 87 82 92 86  Resp:   18 17  Temp:   97.7 F (36.5 C) 97.6 F (36.4 C)  TempSrc:   Oral Oral  SpO2: 99% 98% 98% 97%     General appearance: alert, cooperative and no distress Head: Normocephalic, without obvious abnormality, atraumatic Eyes: negative findings: lids and lashes normal, conjunctivae and sclerae normal and corneas clear Resp: clear to auscultation bilaterally Cardio: regular rate and rhythm, S1, S2 normal, no murmur, click, rub or gallop GI: +BS, mildly tender to palpation, PD catheter in place in periumbilical region with some clear drainage from insertion site Extremities: edema trace pretibial  and LUE BVT +T/B Neuro: awake, alert,  oriented x 3, no asterixis  Labs: Basic Metabolic Panel: Recent Labs  Lab 01/02/18 0939 01/02/18 1128  NA 139 138  K 4.8 4.8  CL 113* 113*  CO2 13* 13*  GLUCOSE 113* 103*  BUN 137* 140*  CREATININE 7.40* 7.49*  ALBUMIN 3.1* 3.4*  CALCIUM 9.3 9.4  PHOS 6.0*  --    Liver Function Tests: Recent Labs  Lab 01/02/18 0939 01/02/18 1128  AST  --  11*  ALT  --  10*  ALKPHOS  --  222*  BILITOT  --  0.3  PROT  --  6.1*  ALBUMIN 3.1* 3.4*   No results for input(s): LIPASE, AMYLASE in the last 168 hours. No results for input(s): AMMONIA in the last 168 hours. CBC: Recent Labs  Lab 01/02/18 0948 01/02/18 1128  WBC  --  8.6  HGB 5.7* 6.0*  HCT  --  18.1*  MCV  --  90.0  PLT  --  180   PT/INR: @LABRCNTIP (inr:5) Cardiac Enzymes: )No results for input(s): CKTOTAL, CKMB, CKMBINDEX, TROPONINI in the last 168 hours. CBG: No results for input(s): GLUCAP in the last 168 hours.  Iron Studies:  Recent Labs  Lab 01/02/18 0939  IRON 100  TIBC 300  FERRITIN 192    Xrays/Other Studies: Ct Abdomen Pelvis Wo Contrast  Result Date: 01/02/2018 CLINICAL DATA:  iron transfusion this morning and he was found to have hgb of 5.7. Pt is a kidney patient will be starting PD soon. Pt alert and oriented in triage, states he feels weak. Pt reports he did bowel prep on Monday and noted his stool was consistently dark, post op EXAM: CT ABDOMEN AND PELVIS WITHOUT CONTRAST TECHNIQUE: Multidetector CT imaging of the abdomen and pelvis was performed following the standard protocol without IV contrast. COMPARISON:  08/23/2016, 02/13/2016 and 10/26/2014. FINDINGS: Lower chest: Mild dependent atelectasis in the lower lobes. Bases otherwise clear. Hepatobiliary: No focal liver  abnormality is seen. No gallstones, gallbladder wall thickening, or biliary dilatation. Pancreas: Unremarkable. No pancreatic ductal dilatation or surrounding inflammatory changes. Spleen: Normal in size without focal abnormality.  Adrenals/Urinary Tract: No adrenal masses. Left kidney is been surgically removed. There are multiple right renal masses of varying attenuation consistent with a combination of simple and complicated cysts. Appearance is stable prior CT. No collecting system stones. No hydronephrosis. Right ureter normal in course and in caliber. Bladder is unremarkable. Stomach/Bowel: Stomach is within normal limits. Appendix appears normal. No evidence of bowel wall thickening, distention, or inflammatory changes. Vascular/Lymphatic: No significant vascular findings are present. No enlarged abdominal or pelvic lymph nodes. Reproductive: Prostate enlarged measuring 5.2 x 4.7 cm transversely. Other: Peritoneal dialysis catheter curls in the right lower quadrant. No abdominal wall hernia. No ascites. Musculoskeletal: No fracture or acute finding multiple Schmorl's nodes noted along the lumbar spine. Ill-defined lucent lesion noted in the right femoral neck. This is new since the CT dated 10/26/2014. This may reflect a brown tumor. It could reflect a plasmacytoma. IMPRESSION: 1. No acute abnormalities within the abdomen or pelvis. No evidence of hemorrhage. 2. Multiple right renal masses consistent with cysts and complicated cysts, unchanged from the most recent prior CT. 3. Lytic lesion in the right femoral neck. This is new since a CT scan dated 10/26/2014. Differential diagnosis includes a brown tumor, plasmacytoma or other lytic neoplastic lesion. 4. Peritoneal dialysis catheter curls in the right mid quadrant. Electronically Signed   By: Lajean Manes M.D.   On: 01/02/2018 14:41     Assessment/Plan: 1.  ABLA, symptomatic- marked Hgb drop and likely with UGI bleed.  GI has been consulted and planning for EGD tomorrow.  He is currently receiving 1 out of 2 units of PRBC's over 3 hours.  Ok to give Lasix 80mg  IV between units. 2. CKD stage 5- s/p PD catheter placement and pending PD training.  Currently with only mild nausea,  no other uremic symptoms.  Will try to hold off on initiating HD if possible and have him start PD training in 2 weeks, however AVF is mature and can be used if needed.  He is active on the transplant list at [facility] and has several potential living donors.  3. Secondary HPTH - He also has had some hypercalcemia related to parathyroid adenomas and has undergone 2 parathyroidectomies in the past.  Will need to start sensipar once he starts PD (his insurance would not approve it prior to PD) and may require another parathyroidectomy.  He has also been evaluated for transplant at [facility] and has several potential donors. 4. HTN- stable 5. Vascular access- he underwent left basilic vein transposition on 03/30/16 by Dr. Trula Slade and had his second stage procedure on 06/13/16. It is mature and ready for use if needed. 6. Lytic lesions of right femoral neck- will check SPEP/UPEP  7. OSA- on CPAP at 12 8. Metabolic acidosis due to #2 on sodium bicarb    Governor Rooks Mabel Roll 01/02/2018, 3:10 PM

## 2018-01-02 NOTE — ED Notes (Signed)
This RN accompanying patient to CT.

## 2018-01-02 NOTE — H&P (View-Only) (Signed)
Consultation  Referring Provider: ER MD Venora Maples  MD Primary Care Physician:  [name], MD Primary Gastroenterologist:  Dr.Gessner  Reason for Consultation:  Melena, anemia  HPI: Phillip Blair is a 62 y.o. male, known to Dr. Carlean Purl from previous colonoscopies.  He was sent to the ER today after outpatient labs showed a hemoglobin of 5.7.  He had been complaining of generalized weakness and increasing fatigue over the past 5-7 days. Patient has chronic kidney disease, he is status post nephrectomy about 3 years ago with history of polycystic kidney disease.  He is currently on [facility] list [facility].  He has a dialysis graft in place, but has not started dialysis as yet. He just underwent peritoneal dialysis catheter placement on Tuesday of this week at [facility].  Preprocedure he did a bowel prep on Monday for 04/2018.  He noticed with the prep that he passed a lot of black tarry appearing stool.  He had not noticed any black stool prior to doing the prep or any bright red blood per rectum. He is not had any previous upper GI issues and no prior EGDs.  His last bowel movement was on Tuesday, 12/31/2017.  He says that was still black. He has not been on any blood thinners, no aspirin or NSAIDs.  He has been noticing some recent increase in heartburn and indigestion and has had some vague dysphasia intermittently.  He has no complaints of abdominal pain prior to the dialysis catheter placement.  He has been very sore since then and has had a lot of leakage from the surgical site.  He has had some nausea but no vomiting. His baseline hemoglobin is about 10.5. Iron studies were done today and within normal limits, ferritin 192, BUN is up to 140 today creatinine 7.49 Also note alk phos of 222. He had CT scan of the abdomen pelvis done in the ER today to rule out bowel perforation or bowel injury post peritoneal dialysis catheter placement.  He was noted to have multiple  complicated renal cysts, dialysis catheter in the right lower quadrant, there is no evidence of hematoma or bowel perforation.  He was noted to have a lytic appearing lesion in the right femoral neck.    Patient is chronically on any H2 blocker or PPI. Last colonoscopy was done in November 2018 with finding of one 5 mm polyp in the rectum which was a tubular adenoma, also noted to have scattered large mouth diverticuli throughout the colon.  Past Medical History:  Diagnosis Date  . Allergy   . Anemia    low iron  . Cancer ([place])    kidney  . Chronic kidney disease    denies  . Family history of kidney stone   . GERD (gastroesophageal reflux disease)    occ  . Gout   . Hypercalcemia   . Hyperlipidemia   . Hypertension    does not see a cardiologist  . Parathyroid adenoma   . Sleep apnea    no longer uses cpap, has lost weight    Past Surgical History:  Procedure Laterality Date  . AV FISTULA PLACEMENT Left 03/30/2016   Procedure: FIRST STAGE BASILIC VEIN TRANSPOSITION LEFT UPPER ARM;  Surgeon: Serafina Mitchell, MD;  Location: [place];  Service: Vascular;  Laterality: Left;  . BASCILIC VEIN TRANSPOSITION Left 06/13/2016   Procedure: SECOND STAGE BASILIC VEIN TRANSPOSITION;  Surgeon: Serafina Mitchell, MD;  Location: [place];  Service: Vascular;  Laterality: Left;  . COLONOSCOPY    . MINIMALLY INVASIVE RADIOACTIVE PARATHYROIDECTOMY N/A 05/04/2013   Procedure: PARATHYROIDECTOMY MINIMALLY INVASIVE;  Surgeon: Harl Bowie, MD;  Location: [place];  Service: General;  Laterality: N/A;  . MINIMALLY INVASIVE RADIOACTIVE PARATHYROIDECTOMY N/A 10/20/2013   Procedure: MINIMALLY INVASIVE PARATHYROIDECTOMY CONVERTED TO COMPLETE NECK EXPLORATION;  Surgeon: Harl Bowie, MD;  Location: [place];  Service: General;  Laterality: N/A;  . NASAL SEPTOPLASTY W/ TURBINOPLASTY    . ROBOT ASSISTED LAPAROSCOPIC NEPHRECTOMY Left 01/19/2015   Procedure: ROBOTIC ASSISTED LAPAROSCOPIC RADICAL NEPHRECTOMY AND  INTRAOPERATIVE ULTRASOUND ;  Surgeon: Alexis Frock, MD;  Location: [facility];  Service: Urology;  Laterality: Left;  Marland Kitchen VASECTOMY      Prior to Admission medications   Medication Sig Start Date End Date Taking? Authorizing Provider  allopurinol (ZYLOPRIM) 100 MG tablet Take 100 mg by mouth daily. 02/22/16   [provider]  amLODipine (NORVASC) 10 MG tablet Take 10 mg by mouth every morning.     [provider]  Azelastine HCl 0.15 % SOLN Place 2 Squirts into the nose 2 (two) times daily. 04/09/16   [provider]  cinacalcet (SENSIPAR) 30 MG tablet Take 30 mg by mouth daily.    [provider]  epoetin alfa (EPOGEN,PROCRIT) 93235 UNIT/ML injection 10,000 Units every 30 (thirty) days.    [provider]  furosemide (LASIX) 40 MG tablet Take 40 mg by mouth 2 (two) times daily. 05/28/15   [provider]  lanthanum (FOSRENOL) 1000 MG chewable tablet Chew 1,000 mg by mouth 2 (two) times daily with a meal.    [provider]  sodium bicarbonate 650 MG tablet Take 650 mg by mouth 2 (two) times daily. 04/29/15   [provider]    Current Facility-Administered Medications  Medication Dose Route Frequency Provider Last Rate Last Dose  . 0.9 %  sodium chloride infusion  500 mL Intravenous Continuous [name], MD       Current Outpatient Medications  Medication Sig Dispense Refill  . allopurinol (ZYLOPRIM) 100 MG tablet Take 100 mg by mouth daily.    Marland Kitchen amLODipine (NORVASC) 10 MG tablet Take 10 mg by mouth every morning.     . Azelastine HCl 0.15 % SOLN Place 2 Squirts into the nose 2 (two) times daily.    . cinacalcet (SENSIPAR) 30 MG tablet Take 30 mg by mouth daily.    Marland Kitchen epoetin alfa (EPOGEN,PROCRIT) 57322 UNIT/ML injection 10,000 Units every 30 (thirty) days.    . furosemide (LASIX) 40 MG tablet Take 40 mg by mouth 2 (two) times daily.  5  . lanthanum (FOSRENOL) 1000 MG chewable tablet Chew 1,000 mg by mouth 2 (two) times  daily with a meal.    . sodium bicarbonate 650 MG tablet Take 650 mg by mouth 2 (two) times daily.  6   Facility-Administered Medications Ordered in Other Encounters  Medication Dose Route Frequency Provider Last Rate Last Dose  . epoetin alfa (EPOGEN,PROCRIT) injection 10,000 Units  10,000 Units Subcutaneous Q28 days Donato Heinz, MD        Allergies as of 01/02/2018 - Review Complete 01/02/2018  Allergen Reaction Noted  . Ivp dye [iodinated diagnostic agents] Itching and Other (See Comments) 01/10/2015    Family History  Problem Relation Age of Onset  . Colon cancer Neg Hx     Social History   Socioeconomic History  . Marital status: Married    Spouse name: Not on file  .  Number of children: Not on file  . Years of education: Not on file  . Highest education level: Not on file  Occupational History  . Not on file  Social Needs  . Financial resource strain: Not on file  . Food insecurity:    Worry: Not on file    Inability: Not on file  . Transportation needs:    Medical: Not on file    Non-medical: Not on file  Tobacco Use  . Smoking status: Never Smoker  . Smokeless tobacco: Never Used  Substance and Sexual Activity  . Alcohol use: Yes    Alcohol/week: 1.2 oz    Types: 2 Glasses of wine per week    Comment: occasional  . Drug use: No  . Sexual activity: Not on file  Lifestyle  . Physical activity:    Days per week: Not on file    Minutes per session: Not on file  . Stress: Not on file  Relationships  . Social connections:    Talks on phone: Not on file    Gets together: Not on file    Attends religious service: Not on file    Active member of club or organization: Not on file    Attends meetings of clubs or organizations: Not on file    Relationship status: Not on file  . Intimate partner violence:    Fear of current or ex partner: Not on file    Emotionally abused: Not on file    Physically abused: Not on file    Forced sexual activity: Not on  file  Other Topics Concern  . Not on file  Social History Narrative  . Not on file    Review of Systems: Pertinent positive and negative review of systems were noted in the above HPI section.  All other review of systems was otherwise negative.Marland Kitchen  Physical Exam: Vital signs in last 24 hours: Temp:  [97.6 F (36.4 C)-98.6 F (37 C)] 97.6 F (36.4 C) (04/11 1404) Pulse Rate:  [82-93] 86 (04/11 1404) Resp:  [17-18] 17 (04/11 1404) BP: (111-143)/(63-84) 125/66 (04/11 1404) SpO2:  [97 %-100 %] 97 % (04/11 1404)   General:   Alert,  Well-developed, well-nourished, pleasant and cooperative in NAD, family at bedside Head:  Normocephalic and atraumatic. Eyes:  Sclera clear, no icterus.   Conjunctiva pale. Ears:  Normal auditory acuity. Nose:  No deformity, discharge,  or lesions. Mouth:  No deformity or lesions.   Neck:  Supple; no masses or thyromegaly. Lungs:  Clear throughout to auscultation.   No wheezes, crackles, or rhonchi. Heart:  Regular rate and rhythm; no murmurs, clicks, rubs,  or gallops. Abdomen:  Soft,tender mid and lower abdomen, incisional site covered, he is weeping serous appearing fluid, there is some erythema and mild induration of the lower abdominal wall, BS active,nonpalp mass or hsm.   Rectal:  Deferred  Msk:  Symmetrical without gross deformities. . Pulses:  Normal pulses noted. Extremities:  Without clubbing or edema. Neurologic:  Alert and  oriented x4;  grossly normal neurologically. Skin:  Intact without significant lesions or rashes.. Psych:  Alert and cooperative. Normal mood and affect.  Intake/Output from previous day: No intake/output data recorded. Intake/Output this shift: No intake/output data recorded.  Lab Results: Recent Labs    01/02/18 0948 01/02/18 1128  WBC  --  8.6  HGB 5.7* 6.0*  HCT  --  18.1*  PLT  --  180   BMET Recent Labs    01/02/18  3500 01/02/18 1128  NA 139 138  K 4.8 4.8  CL 113* 113*  CO2 13* 13*  GLUCOSE  113* 103*  BUN 137* 140*  CREATININE 7.40* 7.49*  CALCIUM 9.3 9.4   LFT Recent Labs    01/02/18 1128  PROT 6.1*  ALBUMIN 3.4*  AST 11*  ALT 10*  ALKPHOS 222*  BILITOT 0.3   PT/INR No results for input(s): LABPROT, INR in the last 72 hours. Hepatitis Panel No results for input(s): HEPBSAG, HCVAB, HEPAIGM, HEPBIGM in the last 72 hours.   IMPRESSION:  #70 62 year old African-American male admitted with profound weakness and fatigue over the past 5-7 days and finding of hemoglobin of 5.7 on an outpatient lab check. Patient reports large amount of black stool with his bowel prep on Monday, 12/30/2017, and one black stool the following day.  He has not had any bowel movement since. No prior history of upper GI issues or any prior GI bleed. He has had a 4 g drop in his hemoglobin over the past couple of weeks.  Suspect upper GI bleeding, rule out esophagitis, gastropathy or peptic ulcer disease. Patient may have also had some blood loss with his peritoneal dialysis catheter placement earlier this week.  #2 chronic kidney disease/renal failure.  Patient is status post nephrectomy for polycystic kidney disease about 3 years ago.  He is currently on the transplant list at [facility]. Not currently dialysis dependent Peritoneal dialysis catheter placed Tuesday, 12/31/2017  #3 history of adenomatous colon polyps-up-to-date with colonoscopy just done November 2018 For diverticulosis #5 hypertension #6.  Hyperlipidemia #7.  History of parathyroidectomy for adenoma #8 lytic lesion in the right femoral head noted on CT scan done earlier today-will need further workup #9 chronic anemia- secondary to chronic kidney disease  PLAN: Patient is receiving 2 units of packed RBCs today, transfuse to keep hemoglobin above 7 Will start low-dose IV PPI Clear liquids today, n.p.o. after midnight Patient has been scheduled for upper endoscopy with Dr. Lyndel Safe tomorrow morning.  Procedure was discussed in  detail with the patient including indications risks and benefits and he is agreeable to proceed.  Discussed question of need for antibiotics preprocedure given placement of peritoneal dialysis catheter earlier this week with Dr. Arty Baumgartner his nephrologist.  He does not feel abx necessary.   Amy Esterwood  01/02/2018, 3:20 PM   Attending physician's note   I have taken an interval history, reviewed the chart and examined the patient. I agree with the Advanced Practitioner's note, impression and recommendations.  Discussed in detail with patient and patient's family. 62 year old with melena, admission hemoglobin 5.7.  Patient with chronic renal failure due to PCKD, on transplant list at [facility], status post peritoneal dialysis catheter placement 12/31/2017, last colonoscopy November 2018. Plan-proceed with EGD in a.m.,  IV low-dose Protonix if okay with nephrology, agree with transfusion, trend CBC, may need hemodialysis or peritoneal dialysis during this admission.  Carmell Austria, MD

## 2018-01-02 NOTE — ED Notes (Signed)
Patient signed informed consent for blood transfusion on Epic. Pt verbalized understanding of receiving blood transfusion and denies questions at this time.

## 2018-01-02 NOTE — H&P (Signed)
History and Physical:    Phillip Blair   WJX:[id number] DOB: 1956-04-17 DOA: 01/02/2018  Referring MD/provider: PA sophia  PCP: [name], MD   Patient coming from: Home  Chief Complaint: black stools and low hemoglobin.  History of Present Illness:   Phillip Blair is an 62 y.o. male with past medical history significant for end-stage renal disease who was in his usual state of good health until 5 days ago when he started on a bowel prep prior to peritoneal dialysis catheter placement. Patient notes that at previous bowel preps the stool had run clear however this time the stool became dark, not black. Patient underwent dialysis catheter placement without difficulty 2 days ago. He continued to have black stools that appear tarry.in retrospect he does think he has felt more tired than usual, and also feels that he has been more cold than usual. His wife notes that he had an episode of presyncope 2 days ago prior to going into the hospital however it was attributed to dehydration from bowel prep. 2 day patient went to get EPO injection and routine laboratory data revealed hemoglobin of 5.8. Patient was sent to ED for likely GI bleed.   ED Course:  The patient was noted to have black tarry guaiac positive stool. He was noted to be hemodynamically stable. He was started on 2 unit PRBC transfusion. He was seen by renal and GI consultation. Plan is for EGD in a.m.  ROS:   ROS   Review of Systems: General: No fever, chills, weight changes Skin: No rashes, lesions, wounds Respiratory: No cough,, shortness of breath, hemoptysis Cardiovascular: No palpitations, chest pain GI: No nausea, vomiting, diarrhea, constipation GU: No dysuria, increased frequency CNS: No numbness, dizziness, headache Musculoskeletal: No back pain, joint pain Blood/lymphatics: No easy bruising, bleeding Mood/affect: No anxiety/depression    Past Medical History:   Past Medical History:    Diagnosis Date  . Allergy   . Anemia    low iron  . Cancer ([place])    kidney  . Chronic kidney disease    denies  . Family history of kidney stone   . GERD (gastroesophageal reflux disease)    occ  . Gout   . Hypercalcemia   . Hyperlipidemia   . Hypertension    does not see a cardiologist  . Parathyroid adenoma   . Sleep apnea    no longer uses cpap, has lost weight    Past Surgical History:   Past Surgical History:  Procedure Laterality Date  . AV FISTULA PLACEMENT Left 03/30/2016   Procedure: FIRST STAGE BASILIC VEIN TRANSPOSITION LEFT UPPER ARM;  Surgeon: Serafina Mitchell, MD;  Location: [facility];  Service: Vascular;  Laterality: Left;  . BASCILIC VEIN TRANSPOSITION Left 06/13/2016   Procedure: SECOND STAGE BASILIC VEIN TRANSPOSITION;  Surgeon: Serafina Mitchell, MD;  Location: [facility];  Service: Vascular;  Laterality: Left;  . COLONOSCOPY    . MINIMALLY INVASIVE RADIOACTIVE PARATHYROIDECTOMY N/A 05/04/2013   Procedure: PARATHYROIDECTOMY MINIMALLY INVASIVE;  Surgeon: Harl Bowie, MD;  Location: [facility];  Service: General;  Laterality: N/A;  . MINIMALLY INVASIVE RADIOACTIVE PARATHYROIDECTOMY N/A 10/20/2013   Procedure: MINIMALLY INVASIVE PARATHYROIDECTOMY CONVERTED TO COMPLETE NECK EXPLORATION;  Surgeon: Harl Bowie, MD;  Location: [place] [facility];  Service: General;  Laterality: N/A;  . NASAL SEPTOPLASTY W/ TURBINOPLASTY    . ROBOT ASSISTED LAPAROSCOPIC NEPHRECTOMY Left 01/19/2015   Procedure: ROBOTIC ASSISTED LAPAROSCOPIC RADICAL NEPHRECTOMY AND INTRAOPERATIVE ULTRASOUND ;  Surgeon: Alexis Frock, MD;  Location: [facility];  Service: Urology;  Laterality: Left;  Marland Kitchen VASECTOMY      Social History:   Social History   Socioeconomic History  . Marital status: Married    Spouse name: Not on file  . Number of children: Not on file  . Years of education: Not on file  . Highest education level: Not on file  Occupational History  . Not on file  Social Needs  . Financial resource  strain: Not on file  . Food insecurity:    Worry: Not on file    Inability: Not on file  . Transportation needs:    Medical: Not on file    Non-medical: Not on file  Tobacco Use  . Smoking status: Never Smoker  . Smokeless tobacco: Never Used  Substance and Sexual Activity  . Alcohol use: Yes    Alcohol/week: 1.2 oz    Types: 2 Glasses of wine per week    Comment: occasional  . Drug use: No  . Sexual activity: Not on file  Lifestyle  . Physical activity:    Days per week: Not on file    Minutes per session: Not on file  . Stress: Not on file  Relationships  . Social connections:    Talks on phone: Not on file    Gets together: Not on file    Attends religious service: Not on file    Active member of club or organization: Not on file    Attends meetings of clubs or organizations: Not on file    Relationship status: Not on file  . Intimate partner violence:    Fear of current or ex partner: Not on file    Emotionally abused: Not on file    Physically abused: Not on file    Forced sexual activity: Not on file  Other Topics Concern  . Not on file  Social History Narrative  . Not on file    Allergies   Ivp dye [iodinated diagnostic agents] and Iodine-131  Family history:   Family History  Problem Relation Age of Onset  . Colon cancer Neg Hx     Current Medications:   Prior to Admission medications   Medication Sig Start Date End Date Taking? Authorizing Provider  allopurinol (ZYLOPRIM) 100 MG tablet Take 100 mg by mouth daily. 02/22/16  Yes [provider]  amLODipine (NORVASC) 10 MG tablet Take 10 mg by mouth every morning.    Yes [provider]  Azelastine HCl 0.15 % SOLN Place 2 sprays into the nose 2 (two) times daily as needed (for allergies).  04/09/16  Yes [provider]  epoetin alfa (EPOGEN,PROCRIT) 63875 UNIT/ML injection 10,000 Units every 30 (thirty) days.   Yes [provider]  fluticasone (FLONASE) 50 MCG/ACT  nasal spray Place 1 spray into the nose 2 (two) times daily as needed for allergies or rhinitis.    Yes [provider]  furosemide (LASIX) 40 MG tablet Take 40 mg by mouth 2 (two) times daily. 05/28/15  Yes [provider]  gentamicin cream (GARAMYCIN) 0.1 % APPLY SMALL AMOUNT TO PD CATHETER EXIT SITE DAILY AFTER CARE 12/06/17  Yes [provider]  HYDROcodone-acetaminophen (NORCO/VICODIN) 5-325 MG tablet Take 1 tablet by mouth every 4 (four) hours as needed for moderate pain.   Yes [provider]  ipratropium (ATROVENT) 0.06 % nasal spray Instill 2 sprays into each nostril three times a day as needed for a runny nose  12/25/17  Yes [provider]  lanthanum (FOSRENOL) 1000 MG chewable tablet Chew 1,000 mg by mouth 3 (three) times daily with meals.    Yes [provider]  sodium bicarbonate 650 MG tablet Take 1,300 mg by mouth 2 (two) times daily.  04/29/15  Yes [provider]    Physical Exam:   Vitals:   01/02/18 1730 01/02/18 1830 01/02/18 1849 01/02/18 1906  BP: 127/72 126/72 126/72 116/66  Pulse: 92 99 94 96  Resp: 16 19 17 17   Temp:   97.9 F (36.6 C) 97.8 F (36.6 C)  TempSrc:   Oral   SpO2: 98% 98% 97% 100%     Physical Exam: Blood pressure 116/66, pulse 96, temperature 97.8 F (36.6 C), resp. rate 17, SpO2 100 %. Gen: pleasant well appearing male in no distress chatting with large family at bedside. Eyes: Sclerae anicteric. Conjunctiva mildly injected. Chest: Moderately good air entry bilaterally with no adventitious sounds.  CV: Distant, regular, 2/6 systolic murmur with radiation to the axilla. Abdomen: NABS, soft, nondistended, nontender. No tenderness to light or deep palpation. No rebound, no guarding. Extremities: No edema.  Skin: Warm and dry. No rashes, lesions or wounds. Neuro: Alert and oriented times 3; grossly nonfocal. Psych: Patient is cooperative, logical and coherent with appropriate mood and  affect.  Data Review:    Labs: Basic Metabolic Panel: Recent Labs  Lab 01/02/18 0939 01/02/18 1128  NA 139 138  K 4.8 4.8  CL 113* 113*  CO2 13* 13*  GLUCOSE 113* 103*  BUN 137* 140*  CREATININE 7.40* 7.49*  CALCIUM 9.3 9.4  PHOS 6.0*  --    Liver Function Tests: Recent Labs  Lab 01/02/18 0939 01/02/18 1128  AST  --  11*  ALT  --  10*  ALKPHOS  --  222*  BILITOT  --  0.3  PROT  --  6.1*  ALBUMIN 3.1* 3.4*   No results for input(s): LIPASE, AMYLASE in the last 168 hours. No results for input(s): AMMONIA in the last 168 hours. CBC: Recent Labs  Lab 01/02/18 0948 01/02/18 1128  WBC  --  8.6  HGB 5.7* 6.0*  HCT  --  18.1*  MCV  --  90.0  PLT  --  180   Cardiac Enzymes: No results for input(s): CKTOTAL, CKMB, CKMBINDEX, TROPONINI in the last 168 hours.  BNP (last 3 results) No results for input(s): PROBNP in the last 8760 hours. CBG: No results for input(s): GLUCAP in the last 168 hours.  Urinalysis    Component Value Date/Time   COLORURINE yellow 05/06/2007 0905   APPEARANCEUR Clear 05/06/2007 0905   LABSPEC 1.015 05/06/2007 0905   PHURINE 6.5 05/06/2007 0905   HGBUR 2+ 05/06/2007 0905   BILIRUBINUR neg 04/02/2013 0921   PROTEINUR neg 04/02/2013 0921   UROBILINOGEN negative 04/02/2013 0921   UROBILINOGEN 0.2 05/06/2007 0905   NITRITE neg 04/02/2013 0921   NITRITE negative 05/06/2007 0905   LEUKOCYTESUR Negative 04/02/2013 0921      Radiographic Studies: Ct Abdomen Pelvis Wo Contrast  Result Date: 01/02/2018 CLINICAL DATA:  iron transfusion this morning and he was found to have hgb of 5.7. Pt is a kidney patient will be starting PD soon. Pt alert and oriented in triage, states he feels weak. Pt reports he did bowel prep on Monday and noted his stool was consistently dark, post op EXAM: CT ABDOMEN AND PELVIS WITHOUT CONTRAST TECHNIQUE: Multidetector CT imaging of the abdomen and pelvis was performed  following the standard protocol without IV  contrast. COMPARISON:  08/23/2016, 02/13/2016 and 10/26/2014. FINDINGS: Lower chest: Mild dependent atelectasis in the lower lobes. Bases otherwise clear. Hepatobiliary: No focal liver abnormality is seen. No gallstones, gallbladder wall thickening, or biliary dilatation. Pancreas: Unremarkable. No pancreatic ductal dilatation or surrounding inflammatory changes. Spleen: Normal in size without focal abnormality. Adrenals/Urinary Tract: No adrenal masses. Left kidney is been surgically removed. There are multiple right renal masses of varying attenuation consistent with a combination of simple and complicated cysts. Appearance is stable prior CT. No collecting system stones. No hydronephrosis. Right ureter normal in course and in caliber. Bladder is unremarkable. Stomach/Bowel: Stomach is within normal limits. Appendix appears normal. No evidence of bowel wall thickening, distention, or inflammatory changes. Vascular/Lymphatic: No significant vascular findings are present. No enlarged abdominal or pelvic lymph nodes. Reproductive: Prostate enlarged measuring 5.2 x 4.7 cm transversely. Other: Peritoneal dialysis catheter curls in the right lower quadrant. No abdominal wall hernia. No ascites. Musculoskeletal: No fracture or acute finding multiple Schmorl's nodes noted along the lumbar spine. Ill-defined lucent lesion noted in the right femoral neck. This is new since the CT dated 10/26/2014. This may reflect a brown tumor. It could reflect a plasmacytoma. IMPRESSION: 1. No acute abnormalities within the abdomen or pelvis. No evidence of hemorrhage. 2. Multiple right renal masses consistent with cysts and complicated cysts, unchanged from the most recent prior CT. 3. Lytic lesion in the right femoral neck. This is new since a CT scan dated 10/26/2014. Differential diagnosis includes a brown tumor, plasmacytoma or other lytic neoplastic lesion. 4. Peritoneal dialysis catheter curls in the right mid quadrant.  Electronically Signed   By: Lajean Manes M.D.   On: 01/02/2018 14:41   Dg Chest Portable 1 View  Result Date: 01/02/2018 CLINICAL DATA:  Low hemoglobin EXAM: PORTABLE CHEST 1 VIEW COMPARISON:  08/23/2016 FINDINGS: The heart size and mediastinal contours are within normal limits. Angular opacity in the right infrahilar lung, possible atelectasis. Postsurgical changes at the thoracic inlet. IMPRESSION: No acute infiltrate or effusion. Probable right infrahilar atelectasis. Electronically Signed   By: Donavan Foil M.D.   On: 01/02/2018 16:15    EKG: Independently reviewed. Sinus rhythm at 91. Normal intervals. Normal axis. No acute ST-T wave changes.   Assessment/Plan:   Principal Problem:   Acute upper GI bleed Active Problems:   GOUT   Essential hypertension   GERD   Chronic kidney disease, stage V ([place])   MELENA Patient with melena 4-5 days. He is hemodynamically stable. He is receiving 2 units PRBC. Plan is for endoscopy in the morning. Patient is on IV pantoprazole.  ESRD Need for urgent dialysis at present. Patient does make urine so should be able to tolerate fluid load with blood without difficulty. Followed by Dr.Coldanado.  HTN Patient's blood pressure is normal even though he has been taking his antihypertensives on top of melena. I will continue his antihypertensives.  GERD IV PPI as above.   Other information:   DVT prophylaxis: Lovenox ordered. Code Status: Full code. Family Communication: wife and large family is at bedside  Disposition Plan: home Consults called: renal and GI Admission status: Inpatient   The medical decision making on this patient was of high complexity and the patient is at high risk for clinical deterioration, therefore this is a level 3 visit.   Dewaine Oats Tublu Hagen Bohorquez [facility]  If 7PM-7AM, please contact night-coverage [email or web address] Password [facility] 01/02/2018, 7:26 PM

## 2018-01-02 NOTE — ED Notes (Signed)
Dinner tray delivered.

## 2018-01-03 ENCOUNTER — Inpatient Hospital Stay (HOSPITAL_COMMUNITY): Payer: BLUE CROSS/BLUE SHIELD | Admitting: Certified Registered Nurse Anesthetist

## 2018-01-03 ENCOUNTER — Encounter (HOSPITAL_COMMUNITY): Payer: Self-pay

## 2018-01-03 ENCOUNTER — Other Ambulatory Visit: Payer: Self-pay

## 2018-01-03 ENCOUNTER — Encounter (HOSPITAL_COMMUNITY)
Admission: EM | Disposition: A | Discharge status: Home / Self Care | Payer: Self-pay | Source: Home / Self Care | Attending: Internal Medicine

## 2018-01-03 DIAGNOSIS — I10 Essential (primary) hypertension: Secondary | ICD-10-CM

## 2018-01-03 DIAGNOSIS — K2100 Gastro-esophageal reflux disease with esophagitis, without bleeding: Secondary | ICD-10-CM

## 2018-01-03 DIAGNOSIS — K449 Diaphragmatic hernia without obstruction or gangrene: Secondary | ICD-10-CM

## 2018-01-03 DIAGNOSIS — K922 Gastrointestinal hemorrhage, unspecified: Secondary | ICD-10-CM

## 2018-01-03 HISTORY — PX: ESOPHAGOGASTRODUODENOSCOPY (EGD) WITH PROPOFOL: SHX5813

## 2018-01-03 LAB — CBC
HCT: 23.4 % — ABNORMAL LOW (ref 39.0–52.0)
HEMOGLOBIN: 7.7 g/dL — AB (ref 13.0–17.0)
MCH: 29.2 pg (ref 26.0–34.0)
MCHC: 32.9 g/dL (ref 30.0–36.0)
MCV: 88.6 fL (ref 78.0–100.0)
PLATELETS: 162 10*3/uL (ref 150–400)
RBC: 2.64 MIL/uL — AB (ref 4.22–5.81)
RDW: 14.6 % (ref 11.5–15.5)
WBC: 7.6 10*3/uL (ref 4.0–10.5)

## 2018-01-03 LAB — PROTEIN ELECTROPHORESIS, SERUM
A/G RATIO SPE: 1.2 (ref 0.7–1.7)
ALBUMIN ELP: 3.1 g/dL (ref 2.9–4.4)
ALPHA-2-GLOBULIN: 0.7 g/dL (ref 0.4–1.0)
Alpha-1-Globulin: 0.3 g/dL (ref 0.0–0.4)
BETA GLOBULIN: 1 g/dL (ref 0.7–1.3)
GLOBULIN, TOTAL: 2.5 g/dL (ref 2.2–3.9)
Gamma Globulin: 0.6 g/dL (ref 0.4–1.8)
Total Protein ELP: 5.6 g/dL — ABNORMAL LOW (ref 6.0–8.5)

## 2018-01-03 LAB — PTH, INTACT AND CALCIUM
Calcium, Total (PTH): 9.1 mg/dL (ref 8.6–10.2)
PTH: 1844 pg/mL — ABNORMAL HIGH (ref 15–65)

## 2018-01-03 LAB — KAPPA/LAMBDA LIGHT CHAINS
KAPPA, LAMDA LIGHT CHAIN RATIO: 1.23 (ref 0.26–1.65)
Kappa free light chain: 51.7 mg/L — ABNORMAL HIGH (ref 3.3–19.4)
LAMDA FREE LIGHT CHAINS: 42 mg/L — AB (ref 5.7–26.3)

## 2018-01-03 LAB — BASIC METABOLIC PANEL
ANION GAP: 13 (ref 5–15)
BUN: 117 mg/dL — ABNORMAL HIGH (ref 6–20)
CHLORIDE: 114 mmol/L — AB (ref 101–111)
CO2: 14 mmol/L — ABNORMAL LOW (ref 22–32)
CREATININE: 7.02 mg/dL — AB (ref 0.61–1.24)
Calcium: 10.1 mg/dL (ref 8.9–10.3)
GFR calc non Af Amer: 7 mL/min — ABNORMAL LOW (ref >60)
GFR, EST AFRICAN AMERICAN: 9 mL/min — AB (ref >60)
Glucose, Bld: 122 mg/dL — ABNORMAL HIGH (ref 65–99)
Potassium: 4.8 mmol/L (ref 3.5–5.1)
SODIUM: 141 mmol/L (ref 135–145)

## 2018-01-03 LAB — HIV ANTIBODY (ROUTINE TESTING W REFLEX): HIV Screen 4th Generation wRfx: NONREACTIVE

## 2018-01-03 LAB — PREPARE RBC (CROSSMATCH)

## 2018-01-03 LAB — HEMOGLOBIN AND HEMATOCRIT, BLOOD
HCT: 26.4 % — ABNORMAL LOW (ref 39.0–52.0)
Hemoglobin: 8.7 g/dL — ABNORMAL LOW (ref 13.0–17.0)

## 2018-01-03 LAB — HEPATITIS B SURFACE ANTIGEN: Hepatitis B Surface Ag: NEGATIVE

## 2018-01-03 LAB — IMMUNOFIXATION, URINE

## 2018-01-03 SURGERY — ESOPHAGOGASTRODUODENOSCOPY (EGD) WITH PROPOFOL
Anesthesia: Monitor Anesthesia Care

## 2018-01-03 MED ORDER — FUROSEMIDE 10 MG/ML IJ SOLN
80.0000 mg | Freq: Once | INTRAMUSCULAR | Status: AC
  Administered 2018-01-03: 80 mg via INTRAVENOUS
  Filled 2018-01-03: qty 8

## 2018-01-03 MED ORDER — PROPOFOL 10 MG/ML IV BOLUS
INTRAVENOUS | Status: DC | PRN
  Administered 2018-01-03: 75 mg via INTRAVENOUS

## 2018-01-03 MED ORDER — SODIUM CHLORIDE 0.9 % IV SOLN
Freq: Once | INTRAVENOUS | Status: AC
  Administered 2018-01-03: 14:00:00 via INTRAVENOUS

## 2018-01-03 SURGICAL SUPPLY — 15 items

## 2018-01-03 NOTE — Progress Notes (Signed)
PROGRESS NOTE    Phillip Blair  HUD:[id number] DOB: 12-05-1955 DOA: 01/02/2018 PCP: Phillip Shanks, MD    Brief Narrative:  62 year old male who presented with hematochezia.  He does have a significant past medical history of end-stage renal disease, who was receiving a bowel prep about 5 days ago in preparation for peritoneal dialysis catheter placement.  He noted dark stool during the bowel prep, associated with generalized weakness and one episode of presyncope.  Outpatient workup with blood count showed a hemoglobin of 5.8.  On the initial physical examination blood pressure 127/72, heart rate 92, respiratory 16, temperature 97.9, saturation 97%.  Moist mucous membranes, positive pallor, lungs clear to auscultation bilaterally, heart S1-S2 present rhythmic, abdomen soft nontender, no lower extremity edema.  Sodium 138, potassium 4.8, chloride 113, bicarb 13, glucose 103, BUN 140, creatinine 7.49, white count 8.6, hemoglobin 6.0, hematocrit 18.1, platelets 180.  CT of the abdomen no acute abnormalities, the pole right renal masses consistent with cysts and complicated cysts, incidental lytic lesion in the right femoral neck.  Chest x-ray negative for infiltrates.  EKG with normal sinus rhythm normal axis and normal intervals.  Patient was admitted to the hospital with a working diagnosis of a symptomatic anemia due to acute blood loss due to upper GI bleed.    Assessment & Plan:   Principal Problem:   Acute upper GI bleed Active Problems:   GOUT   Essential hypertension   GERD   Chronic kidney disease, stage V ([facility])   Gastrointestinal hemorrhage   Hiatal hernia with gastroesophageal reflux disease and esophagitis  1. Symptomatic anemia. Hb and hct have improved after prbc transfusion, egd with erosive esophagitis, will continue to follow cell count in am, advance diet as tolerated.   2. Upper GI Bleed. Continue antiacid therapy, advance diet as tolerated.   3. End stage renal  disease. Patient will need peritoneal dialysis catheter flushes and dressing changes per nephrology. Patient euvolemic, tolerated prbc transfusion, will continue loop diuretics to keep fluid balance. Continue fosrenol and sodium bicarbonate.   4. HTN. Continue blood pressure control with amlodipine.   DVT prophylaxis: scd  Code Status:  full Family Communication: I spoke with patient's family at the bedside and all questions were addressed.  Disposition Plan: home when stable hb and hct   Consultants:   Nephrology   GI  Procedures:   Upper endoscopy   Antimicrobials:       Subjective: Patient is feeling better, positive abdominal pain at the lower quadrant, no nausea or vomiting, no further hematemesis, melena or hematochezia. No chest pain.   Objective: Vitals:   01/03/18 0446 01/03/18 0934 01/03/18 1033 01/03/18 1040  BP: 129/80 (!) 147/92 (!) 106/59 (!) 128/58  Pulse: 88 97 93 87  Resp: 16 14 18 16   Temp: 97.8 F (36.6 C) 98.3 F (36.8 C) 98.3 F (36.8 C)   TempSrc: Oral Oral Oral   SpO2: 99% 100% 100% 98%  Weight:      Height:        Intake/Output Summary (Last 24 hours) at 01/03/2018 1108 Last data filed at 01/03/2018 0449 Gross per 24 hour  Intake 472.33 ml  Output 1850 ml  Net -1377.67 ml   Filed Weights   01/02/18 2157  Weight: 87.7 kg (193 lb 4.8 oz)    Examination:   General: Not in pain or dyspnea, deconditioned Neurology: Awake and alert, non focal  E ENT: positive pallor, no icterus, oral mucosa moist Cardiovascular: No JVD.  S1-S2 present, rhythmic, no gallops, rubs, or murmurs. No lower extremity edema. Pulmonary: decreased breath sounds bilaterally, adequate air movement, no wheezing, rhonchi or rales. Gastrointestinal. Abdomen with no organomegaly, no rebound or guarding. Dressing in place, peritoneal catheter in place. Mild tender to palpation.  Skin. No rashes Musculoskeletal: no joint deformities     Data Reviewed: I have  personally reviewed following labs and imaging studies  CBC: Recent Labs  Lab 01/02/18 0948 01/02/18 1128 01/02/18 2350  WBC  --  8.6 7.6  HGB 5.7* 6.0* 7.7*  HCT  --  18.1* 23.4*  MCV  --  90.0 88.6  PLT  --  180 354   Basic Metabolic Panel: Recent Labs  Lab 01/02/18 0939 01/02/18 1128  NA 139 138  K 4.8 4.8  CL 113* 113*  CO2 13* 13*  GLUCOSE 113* 103*  BUN 137* 140*  CREATININE 7.40* 7.49*  CALCIUM 9.3  9.1 9.4  PHOS 6.0*  --    GFR: Estimated Creatinine Clearance: 11.2 mL/min (A) (by C-G formula based on SCr of 7.49 mg/dL (H)). Liver Function Tests: Recent Labs  Lab 01/02/18 0939 01/02/18 1128  AST  --  11*  ALT  --  10*  ALKPHOS  --  222*  BILITOT  --  0.3  PROT  --  6.1*  ALBUMIN 3.1* 3.4*   No results for input(s): LIPASE, AMYLASE in the last 168 hours. No results for input(s): AMMONIA in the last 168 hours. Coagulation Profile: No results for input(s): INR, PROTIME in the last 168 hours. Cardiac Enzymes: No results for input(s): CKTOTAL, CKMB, CKMBINDEX, TROPONINI in the last 168 hours. BNP (last 3 results) No results for input(s): PROBNP in the last 8760 hours. HbA1C: No results for input(s): HGBA1C in the last 72 hours. CBG: No results for input(s): GLUCAP in the last 168 hours. Lipid Profile: No results for input(s): CHOL, HDL, LDLCALC, TRIG, CHOLHDL, LDLDIRECT in the last 72 hours. Thyroid Function Tests: No results for input(s): TSH, T4TOTAL, FREET4, T3FREE, THYROIDAB in the last 72 hours. Anemia Panel: Recent Labs    01/02/18 0939  FERRITIN 192  TIBC 300  IRON 100      Radiology Studies: I have reviewed all of the imaging during this hospital visit personally     Scheduled Meds: . allopurinol  100 mg Oral Daily  . amLODipine  10 mg Oral q morning - 10a  . furosemide  40 mg Oral BID  . gentamicin cream   Topical TID  . ipratropium  1 spray Each Nare TID  . lanthanum  1,000 mg Oral TID WC  . pantoprazole (PROTONIX) IV  40  mg Intravenous Q24H  . sodium bicarbonate  1,300 mg Oral BID   Continuous Infusions: . sodium chloride Stopped (01/03/18 1048)     LOS: 1 day        [name] Gerome Apley, MD [facility] Pager [phone]

## 2018-01-03 NOTE — Anesthesia Postprocedure Evaluation (Signed)
Anesthesia Post Note  Patient: BLAYZE HAEN  Procedure(s) Performed: ESOPHAGOGASTRODUODENOSCOPY (EGD) WITH PROPOFOL (N/A )     Patient location during evaluation: Endoscopy Anesthesia Type: MAC Level of consciousness: awake and sedated Pain management: pain level controlled Vital Signs Assessment: post-procedure vital signs reviewed and stable Respiratory status: spontaneous breathing, nonlabored ventilation, respiratory function stable and patient connected to nasal cannula oxygen Cardiovascular status: stable and blood pressure returned to baseline Postop Assessment: no apparent nausea or vomiting Anesthetic complications: no    Last Vitals:  Vitals:   01/03/18 1040 01/03/18 1116  BP: (!) 128/58 127/86  Pulse: 87 93  Resp: 16 19  Temp:  36.5 C  SpO2: 98% 100%    Last Pain:  Vitals:   01/03/18 1116  TempSrc: Oral  PainSc:                  Jaycelyn Orrison,JAMES TERRILL

## 2018-01-03 NOTE — Care Management Note (Addendum)
Case Management Note  Patient Details  Name: Phillip Blair MRN: [id number] Date of Birth: 06/22/1956  Subjective/Objective:  History of CHF due to [facility] currently on transplant list at [facility], s/p PD catheter placement 12/31/2017, nephrectomy 3 years ago, HTN, hyperlipidemia.  Admiited with melena, hgb 5.7.               Action/Plan: PCP noted.  Prior to admission patient lived at home with wife.  NCM will continue to follow for discharge needs.  Expected Discharge Date:    To be determined             Expected Discharge Plan:  Home/Self Care  Discharge planning Services  CM Consult  Status of Service:  In process, will continue to follow  Kristen Cardinal, RN  Nurse case [place] 01/03/2018, 11:52 AM

## 2018-01-03 NOTE — Op Note (Addendum)
[facility] Patient Name: Phillip Blair Procedure Date : 01/03/2018 MRN: [id number] Attending MD: Jackquline Denmark MD, MD Date of Birth: [birth year] CSN: [id number] Age: 62 Admit Type: Inpatient Procedure:                Upper GI endoscopy Indications:              Suspected upper gastrointestinal bleeding, Heartburn Providers:                Jackquline Denmark MD, MD, Vista Lawman, RN, Charolette Child, Technician, Cira Servant, CRNA Referring MD:              Medicines:                Monitored Anesthesia Care Complications:            No immediate complications. Estimated Blood Loss:     Estimated blood loss: none. Procedure:                Pre-Anesthesia Assessment:                           - Prior to the procedure, a History and Physical                            was performed, and patient medications and                            allergies were reviewed. The patient is competent.                            The risks and benefits of the procedure and the                            sedation options and risks were discussed with the                            patient. All questions were answered and informed                            consent was obtained. Patient identification and                            proposed procedure were verified by the physician                            in the pre-procedure area in the procedure room.                            Mental Status Examination: alert and oriented.                            Prophylactic Antibiotics: The patient does not  require prophylactic antibiotics. Prior                            Anticoagulants: The patient has taken no previous                            anticoagulant or antiplatelet agents. ASA Grade                            Assessment: III - A patient with severe systemic                            disease. After reviewing the risks and benefits,                       the patient was deemed in satisfactory condition to                            undergo the procedure. The anesthesia plan was to                            use monitored anesthesia care (MAC). Immediately                            prior to administration of medications, the patient                            was re-assessed for adequacy to receive sedatives.                            The heart rate, respiratory rate, oxygen                            saturations, blood pressure, adequacy of pulmonary                            ventilation, and response to care were monitored                            throughout the procedure. The physical status of                            the patient was re-assessed after the procedure.                           After obtaining informed consent, the endoscope was                            passed under direct vision. Throughout the                            procedure, the patient's blood pressure, pulse, and  oxygen saturations were monitored continuously. The                            EG-[id number] ([id number]) scope was introduced through the                            mouth, and advanced to the second part of duodenum.                            The upper GI endoscopy was accomplished without                            difficulty. The patient tolerated the procedure                            well. Scope In: Scope Out: Findings:      A 3 cm hiatal hernia with erosive esophagitis with a linear erosions.       Wide-open Schatzki's ring was noted.      The exam was otherwise without abnormality. No active bleeding.  Impression:               - Erosive esophagitis LA Grade B.                           - Hiatal Hernia.                           - Wide-open Schatzki's ring was noted. Recommendation:           - Return patient to hospital ward for ongoing care.                           - Patient has a contact  number available for                            emergencies. The signs and symptoms of potential                            delayed complications were discussed with the                            patient. Return to normal activities tomorrow.                            Written discharge instructions were provided to the                            patient.                           - Resume previous diet.                           - Use Protonix (pantoprazole) 20 mg PO daily for 12  weeks (low dose due to CRI).  Procedure Code(s):        --- Professional ---                           [phone], Esophagogastroduodenoscopy, flexible,                            transoral; diagnostic, including collection of                            specimen(s) by brushing or washing, when performed                            (separate procedure) Diagnosis Code(s):        --- Professional ---                           K44.9, Diaphragmatic hernia without obstruction or                            gangrene                           R12, Heartburn CPT copyright 2017 [employer]. All rights reserved. The codes documented in this report are preliminary and upon coder review may  be revised to meet current compliance requirements. Jackquline Denmark MD, MD 01/03/2018 10:35:09 AM This report has been signed electronically. Number of Addenda: 0

## 2018-01-03 NOTE — Progress Notes (Addendum)
PD catheter dressing changed.  Site WNL 3 days post op.  Some tenderness noted.  Peritoneal space flushed 3 times with aprox 231mL of 1.5% Ca dialysate.  Fluid return clear with very slight pink tinge for all flushed.  Catheter capped, clamped, and secured on patient.  Will continue to monitor.

## 2018-01-03 NOTE — Progress Notes (Signed)
Patient ID: Phillip Blair, male   DOB: 09-09-1956, 62 y.o.   MRN: [id number] [facility] Progress Note   Assessment/ Plan:   1.  Symptomatic acute blood loss anemia: Appears consistent to upper GI bleed from what appears to be erosive esophagitis based on endoscopy from today.  Clinically doing better and will transfuse 1 more unit today to get him >8 g/dL as well as give him his dose of Aranesp. 2.  Chronic kidney disease stage V: Elevated BUN likely from recent upper GI bleed-he does not have any uremic symptoms or signs to prompt need for dialysis. 3.  Recent PD catheter placement with repair of abdominal wall hernia: Will order for PD catheter flush and dressing changes. 4.  Hypertension: Blood pressures appear to be within acceptable range, continue to monitor  for adjustment of therapy. 5.  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  Ongoing monitoring for Sensipar.  Subjective:   Reports to be feeling better, some oozing around recent surgical sites/abdominal wall reported.  Denies any hematemesis.   Objective:   BP 127/86 (BP Location: Right Arm)   Pulse 93   Temp 97.7 F (36.5 C) (Oral)   Resp 19   Ht 6' (1.829 m)   Wt 87.7 kg (193 lb 4.8 oz)   SpO2 100%   BMI 26.22 kg/m   Intake/Output Summary (Last 24 hours) at 01/03/2018 1213 Last data filed at 01/03/2018 1132 Gross per 24 hour  Intake 712.33 ml  Output 2050 ml  Net -1337.67 ml   Weight change:   Physical Exam: Gen: Comfortably resting in bed, wife at bedside CVS: Pulse regular rhythm, normal rate, S1 and S2 normal Resp: Clear to auscultation, no rales/retractions or rhonchi Abd: Soft, obese, blood stained dressings specifically over the hernia repair incision Ext: Trace pretibial edema, palpable thrill left brachial basilic fistula  Imaging: Ct Abdomen Pelvis Wo Contrast  Result Date: 01/02/2018 CLINICAL  DATA:  iron transfusion this morning and he was found to have hgb of 5.7. Pt is a kidney patient will be starting PD soon. Pt alert and oriented in triage, states he feels weak. Pt reports he did bowel prep on Monday and noted his stool was consistently dark, post op EXAM: CT ABDOMEN AND PELVIS WITHOUT CONTRAST TECHNIQUE: Multidetector CT imaging of the abdomen and pelvis was performed following the standard protocol without IV contrast. COMPARISON:  08/23/2016, 02/13/2016 and 10/26/2014. FINDINGS: Lower chest: Mild dependent atelectasis in the lower lobes. Bases otherwise clear. Hepatobiliary: No focal liver abnormality is seen. No gallstones, gallbladder wall thickening, or biliary dilatation. Pancreas: Unremarkable. No pancreatic ductal dilatation or surrounding inflammatory changes. Spleen: Normal in size without focal abnormality. Adrenals/Urinary Tract: No adrenal masses. Left kidney is been surgically removed. There are multiple right renal masses of varying attenuation consistent with a combination of simple and complicated cysts. Appearance is stable prior CT. No collecting system stones. No hydronephrosis. Right ureter normal in course and in caliber. Bladder is unremarkable. Stomach/Bowel: Stomach is within normal limits. Appendix appears normal. No evidence of bowel wall thickening, distention, or inflammatory changes. Vascular/Lymphatic: No significant vascular findings are present. No enlarged abdominal or pelvic lymph nodes. Reproductive: Prostate enlarged measuring 5.2 x 4.7 cm transversely. Other: Peritoneal dialysis catheter curls in the right lower quadrant. No abdominal wall hernia. No ascites. Musculoskeletal: No fracture or acute finding multiple Schmorl's nodes noted along the lumbar spine. Ill-defined lucent lesion noted in the right femoral neck.  This is new since the CT dated 10/26/2014. This may reflect a brown tumor. It could reflect a plasmacytoma. IMPRESSION: 1. No acute abnormalities  within the abdomen or pelvis. No evidence of hemorrhage. 2. Multiple right renal masses consistent with cysts and complicated cysts, unchanged from the most recent prior CT. 3. Lytic lesion in the right femoral neck. This is new since a CT scan dated 10/26/2014. Differential diagnosis includes a brown tumor, plasmacytoma or other lytic neoplastic lesion. 4. Peritoneal dialysis catheter curls in the right mid quadrant. Electronically Signed   By: Lajean Manes M.D.   On: 01/02/2018 14:41   Dg Chest Portable 1 View  Result Date: 01/02/2018 CLINICAL DATA:  Low hemoglobin EXAM: PORTABLE CHEST 1 VIEW COMPARISON:  08/23/2016 FINDINGS: The heart size and mediastinal contours are within normal limits. Angular opacity in the right infrahilar lung, possible atelectasis. Postsurgical changes at the thoracic inlet. IMPRESSION: No acute infiltrate or effusion. Probable right infrahilar atelectasis. Electronically Signed   By: Donavan Foil M.D.   On: 01/02/2018 16:15    Labs: BMET Recent Labs  Lab 01/02/18 0939 01/02/18 1128  NA 139 138  K 4.8 4.8  CL 113* 113*  CO2 13* 13*  GLUCOSE 113* 103*  BUN 137* 140*  CREATININE 7.40* 7.49*  CALCIUM 9.3  9.1 9.4  PHOS 6.0*  --    CBC Recent Labs  Lab 01/02/18 0948 01/02/18 1128 01/02/18 2350  WBC  --  8.6 7.6  HGB 5.7* 6.0* 7.7*  HCT  --  18.1* 23.4*  MCV  --  90.0 88.6  PLT  --  180 162    Medications:    . allopurinol  100 mg Oral Daily  . amLODipine  10 mg Oral q morning - 10a  . furosemide  40 mg Oral BID  . gentamicin cream   Topical TID  . ipratropium  1 spray Each Nare TID  . lanthanum  1,000 mg Oral TID WC  . pantoprazole (PROTONIX) IV  40 mg Intravenous Q24H  . sodium bicarbonate  1,300 mg Oral BID   Elmarie Shiley, MD 01/03/2018, 12:13 PM

## 2018-01-03 NOTE — Transfer of Care (Signed)
Immediate Anesthesia Transfer of Care Note  Patient: Phillip Blair  Procedure(s) Performed: ESOPHAGOGASTRODUODENOSCOPY (EGD) WITH PROPOFOL (N/A )  Patient Location: PACU and Endoscopy Unit  Anesthesia Type:MAC  Level of Consciousness: drowsy and patient cooperative  Airway & Oxygen Therapy: Patient Spontanous Breathing and Patient connected to nasal cannula oxygen  Post-op Assessment: Report given to RN and Post -op Vital signs reviewed and stable  Post vital signs: Reviewed and stable  Last Vitals:  Vitals Value Taken Time  BP    Temp    Pulse    Resp    SpO2      Last Pain:  Vitals:   01/03/18 0934  TempSrc: Oral  PainSc: 5          Complications: No apparent anesthesia complications

## 2018-01-03 NOTE — Interval H&P Note (Signed)
History and Physical Interval Note:  01/03/2018 9:59 AM  Phillip Blair  has presented today for surgery, with the diagnosis of melena, anemia  The various methods of treatment have been discussed with the patient and family. After consideration of risks, benefits and other options for treatment, the patient has consented to  Procedure(s): ESOPHAGOGASTRODUODENOSCOPY (EGD) WITH PROPOFOL (N/A) as a surgical intervention .  The patient's history has been reviewed, patient examined, no change in status, stable for surgery.  I have reviewed the patient's chart and labs.  Questions were answered to the patient's satisfaction.     Jackquline Denmark

## 2018-01-03 NOTE — Anesthesia Preprocedure Evaluation (Signed)
Anesthesia Evaluation  Patient identified by MRN, date of birth, ID band Patient awake    Reviewed: Allergy & Precautions, NPO status , Patient's Chart, lab work & pertinent test results  History of Anesthesia Complications Negative for: history of anesthetic complications  Airway Mallampati: II  TM Distance: >3 FB Neck ROM: Full    Dental  (+) Teeth Intact   Pulmonary sleep apnea ,    breath sounds clear to auscultation       Cardiovascular hypertension,  Rhythm:Regular Rate:Normal     Neuro/Psych    GI/Hepatic GERD  ,GI blood loss   Endo/Other    Renal/GU Renal disease     Musculoskeletal   Abdominal   Peds  Hematology   Anesthesia Other Findings   Reproductive/Obstetrics                             Anesthesia Physical Anesthesia Plan  ASA: III  Anesthesia Plan: MAC   Post-op Pain Management:    Induction: Intravenous  PONV Risk Score and Plan: 2 and Treatment may vary due to age or medical condition  Airway Management Planned: Natural Airway and Nasal Cannula  Additional Equipment:   Intra-op Plan:   Post-operative Plan:   Informed Consent: I have reviewed the patients History and Physical, chart, labs and discussed the procedure including the risks, benefits and alternatives for the proposed anesthesia with the patient or authorized representative who has indicated his/her understanding and acceptance.     Plan Discussed with: CRNA  Anesthesia Plan Comments:         Anesthesia Quick Evaluation

## 2018-01-04 DIAGNOSIS — D649 Anemia, unspecified: Secondary | ICD-10-CM

## 2018-01-04 LAB — BASIC METABOLIC PANEL
Anion gap: 10 (ref 5–15)
BUN: 114 mg/dL — AB (ref 6–20)
CO2: 17 mmol/L — ABNORMAL LOW (ref 22–32)
Calcium: 9.8 mg/dL (ref 8.9–10.3)
Chloride: 114 mmol/L — ABNORMAL HIGH (ref 101–111)
Creatinine, Ser: 7.1 mg/dL — ABNORMAL HIGH (ref 0.61–1.24)
GFR calc Af Amer: 9 mL/min — ABNORMAL LOW (ref >60)
GFR calc non Af Amer: 7 mL/min — ABNORMAL LOW (ref >60)
Glucose, Bld: 102 mg/dL — ABNORMAL HIGH (ref 65–99)
POTASSIUM: 4.4 mmol/L (ref 3.5–5.1)
SODIUM: 141 mmol/L (ref 135–145)

## 2018-01-04 LAB — CBC
HCT: 27.4 % — ABNORMAL LOW (ref 39.0–52.0)
Hemoglobin: 9 g/dL — ABNORMAL LOW (ref 13.0–17.0)
MCH: 28.8 pg (ref 26.0–34.0)
MCHC: 32.8 g/dL (ref 30.0–36.0)
MCV: 87.8 fL (ref 78.0–100.0)
PLATELETS: 174 10*3/uL (ref 150–400)
RBC: 3.12 MIL/uL — ABNORMAL LOW (ref 4.22–5.81)
RDW: 15.7 % — AB (ref 11.5–15.5)
WBC: 7 10*3/uL (ref 4.0–10.5)

## 2018-01-04 LAB — TYPE AND SCREEN
ABO/RH(D): O POS
Antibody Screen: NEGATIVE
UNIT DIVISION: 0
Unit division: 0
Unit division: 0

## 2018-01-04 LAB — BPAM RBC
Blood Product Expiration Date: 201905022359
Blood Product Expiration Date: 201905022359
Blood Product Expiration Date: 201905032359
ISSUE DATE / TIME: 201904111840
ISSUE DATE / TIME: 201904111328
ISSUE DATE / TIME: 201904121353
UNIT TYPE AND RH: 5100
Unit Type and Rh: 5100
Unit Type and Rh: 5100

## 2018-01-04 MED ORDER — DARBEPOETIN ALFA 60 MCG/0.3ML IJ SOSY
60.0000 ug | PREFILLED_SYRINGE | INTRAMUSCULAR | Status: DC
  Administered 2018-01-04: 60 ug via SUBCUTANEOUS
  Filled 2018-01-04 (×2): qty 0.3

## 2018-01-04 MED ORDER — RANITIDINE HCL 150 MG PO TABS: 150.0000 mg | ORAL_TABLET | Freq: Two times a day (BID) | ORAL | 0 refills | Status: DC

## 2018-01-04 MED ORDER — PANTOPRAZOLE SODIUM 40 MG PO TBEC
40.0000 mg | DELAYED_RELEASE_TABLET | Freq: Every day | ORAL | Status: DC
  Administered 2018-01-04: 40 mg via ORAL
  Filled 2018-01-04: qty 1

## 2018-01-04 NOTE — Discharge Summary (Addendum)
Physician Discharge Summary  Phillip Blair EZM:[id number] DOB: 1956-08-24 DOA: 01/02/2018  PCP: [name], MD  Admit date: 01/02/2018 Discharge date: 01/04/2018  Admitted From: Home Disposition:  home  Recommendations for Outpatient Follow-up and new medication changes:  1. Follow up with PCP in 1 week 2. Patient has been placed on ranitidine for acid suppression.  3. Patient received Aranesp in the hospital.   Home Health: no   Equipment/Devices: no    Discharge Condition: stable  CODE STATUS: full  Diet recommendation: Heart healthy and renal preudent  Brief/Interim Summary: 62 year old male who presented with hematochezia.  He does have a significant past medical history of end-stage renal disease, who was receiving a bowel prep about 5 days ago in preparation for peritoneal dialysis catheter placement.  He noted dark stool during the bowel prep, associated with generalized weakness and one episode of presyncope.  Outpatient workup with blood count showed a hemoglobin of 5.8.  On the initial physical examination blood pressure 127/72, heart rate 92, respiratory rate 16, temperature 97.9, oxygen saturation 97%.  Moist mucous membranes, positive pallor, lungs clear to auscultation bilaterally, heart S1-S2 present and rhythmic, abdomen soft nontender, no lower extremity edema.  Sodium 138, potassium 4.8, chloride 113, bicarb 13, glucose 103, BUN 140, creatinine 7.49, white count 8.6, hemoglobin 6.0, hematocrit 18.1, platelets 180.  CT of the abdomen no acute abnormalities, the pole right renal masses consistent with cysts and complicated cysts, incidental lytic lesion in the right femoral neck.  Chest x-ray negative for infiltrates.  EKG with normal sinus rhythm normal axis and normal intervals.  Patient was admitted to the hospital with a working diagnosis of a symptomatic anemia due to acute blood loss due to upper GI bleed.   1.  Symptomatic anemia.  Patient was admitted to the  medical ward, he was placed on a remote telemetry monitor, received 2 units packed red blood cells with good toleration, his hemoglobin has remained stable, discharge 9.0, hematocrit 27.4.  No signs of further bleeding.  2.  Upper GI bleed due to ulcerative esophagitis, complicated with acute blood loss.  Patient was kept nothing by mouth, received IV proton pump inhibitors, evaluation by gastroenterology, he underwent upper endoscopy showing erosive esophagitis, hiatal hernia and a wide open Schatzki's ring.  Diet was advanced with good toleration, patient will be discharged on ranitidine, currenlty will avoid chronic proton pump inhibitors due to decreased GFR and risk of worsening renal failure.   3.  End-stage renal disease with anemia of chronic renal disease and secondary hyperparathyroidism..  Patient remained euvolemic, he received IV furosemide after blood transfusion, no signs of volume overload.  Patient will continue Fosrenol and sodium bicarbonate, continue loop diuretics.  Follow-up as an outpatient on calcium and phosphate levels.  Patient is known to have history of parathyroid adenoma status post 2 parathyroidectomies in the past.  PD catheter was flushed, follow as an outpatient.  Discharge sodium 141, potassium 4.4, chloride 114, bicarb 17, BUN 114, creatinine 7.10.   4.  Hypertension.  Continue amlodipine for blood pressure control.   Discharge Diagnoses:  Principal Problem:   Acute upper GI bleed Active Problems:   GOUT   Essential hypertension   GERD   Chronic kidney disease, stage V (HCC)   Gastrointestinal hemorrhage   Hiatal hernia with gastroesophageal reflux disease and esophagitis    Discharge Instructions   Allergies as of 01/04/2018      Reactions   Ivp Dye [iodinated Diagnostic Agents] Itching, Other (  See Comments)   SKIN PEELS   Iodine-131 Itching, Rash      Medication List    TAKE these medications   allopurinol 100 MG tablet Commonly known as:   ZYLOPRIM Take 100 mg by mouth daily.   amLODipine 10 MG tablet Commonly known as:  NORVASC Take 10 mg by mouth every morning.   Azelastine HCl 0.15 % Soln Place 2 sprays into the nose 2 (two) times daily as needed (for allergies).   epoetin alfa 10000 UNIT/ML injection Commonly known as:  EPOGEN,PROCRIT 10,000 Units every 30 (thirty) days.   fluticasone 50 MCG/ACT nasal spray Commonly known as:  FLONASE Place 1 spray into the nose 2 (two) times daily as needed for allergies or rhinitis.   furosemide 40 MG tablet Commonly known as:  LASIX Take 40 mg by mouth 2 (two) times daily.   gentamicin cream 0.1 % Commonly known as:  GARAMYCIN APPLY SMALL AMOUNT TO PD CATHETER EXIT SITE DAILY AFTER CARE   HYDROcodone-acetaminophen 5-325 MG tablet Commonly known as:  NORCO/VICODIN Take 1 tablet by mouth every 4 (four) hours as needed for moderate pain.   ipratropium 0.06 % nasal spray Commonly known as:  ATROVENT Instill 2 sprays into each nostril three times a day as needed for a runny nose   lanthanum 1000 MG chewable tablet Commonly known as:  FOSRENOL Chew 1,000 mg by mouth 3 (three) times daily with meals.   ranitidine 150 MG tablet Commonly known as:  ZANTAC Take 1 tablet (150 mg total) by mouth 2 (two) times daily.   sodium bicarbonate 650 MG tablet Take 1,300 mg by mouth 2 (two) times daily.       Allergies  Allergen Reactions  . Ivp Dye [Iodinated Diagnostic Agents] Itching and Other (See Comments)    SKIN PEELS  . Iodine-131 Itching and Rash    Consultations:  Nephrology   Gastroenterology    Procedures/Studies: Ct Abdomen Pelvis Wo Contrast  Result Date: 01/02/2018 CLINICAL DATA:  iron transfusion this morning and he was found to have hgb of 5.7. Pt is a kidney patient will be starting PD soon. Pt alert and oriented in triage, states he feels weak. Pt reports he did bowel prep on Monday and noted his stool was consistently dark, post op EXAM: CT ABDOMEN  AND PELVIS WITHOUT CONTRAST TECHNIQUE: Multidetector CT imaging of the abdomen and pelvis was performed following the standard protocol without IV contrast. COMPARISON:  08/23/2016, 02/13/2016 and 10/26/2014. FINDINGS: Lower chest: Mild dependent atelectasis in the lower lobes. Bases otherwise clear. Hepatobiliary: No focal liver abnormality is seen. No gallstones, gallbladder wall thickening, or biliary dilatation. Pancreas: Unremarkable. No pancreatic ductal dilatation or surrounding inflammatory changes. Spleen: Normal in size without focal abnormality. Adrenals/Urinary Tract: No adrenal masses. Left kidney is been surgically removed. There are multiple right renal masses of varying attenuation consistent with a combination of simple and complicated cysts. Appearance is stable prior CT. No collecting system stones. No hydronephrosis. Right ureter normal in course and in caliber. Bladder is unremarkable. Stomach/Bowel: Stomach is within normal limits. Appendix appears normal. No evidence of bowel wall thickening, distention, or inflammatory changes. Vascular/Lymphatic: No significant vascular findings are present. No enlarged abdominal or pelvic lymph nodes. Reproductive: Prostate enlarged measuring 5.2 x 4.7 cm transversely. Other: Peritoneal dialysis catheter curls in the right lower quadrant. No abdominal wall hernia. No ascites. Musculoskeletal: No fracture or acute finding multiple Schmorl's nodes noted along the lumbar spine. Ill-defined lucent lesion noted in the right femoral  neck. This is new since the CT dated 10/26/2014. This may reflect a brown tumor. It could reflect a plasmacytoma. IMPRESSION: 1. No acute abnormalities within the abdomen or pelvis. No evidence of hemorrhage. 2. Multiple right renal masses consistent with cysts and complicated cysts, unchanged from the most recent prior CT. 3. Lytic lesion in the right femoral neck. This is new since a CT scan dated 10/26/2014. Differential diagnosis  includes a brown tumor, plasmacytoma or other lytic neoplastic lesion. 4. Peritoneal dialysis catheter curls in the right mid quadrant. Electronically Signed   By: Lajean Manes M.D.   On: 01/02/2018 14:41   Dg Chest Portable 1 View  Result Date: 01/02/2018 CLINICAL DATA:  Low hemoglobin EXAM: PORTABLE CHEST 1 VIEW COMPARISON:  08/23/2016 FINDINGS: The heart size and mediastinal contours are within normal limits. Angular opacity in the right infrahilar lung, possible atelectasis. Postsurgical changes at the thoracic inlet. IMPRESSION: No acute infiltrate or effusion. Probable right infrahilar atelectasis. Electronically Signed   By: Donavan Foil M.D.   On: 01/02/2018 16:15       Subjective: Patient is feeling better, no abdominal pain, no nausea or vomiting, no fever or chills, no hematemesis or melena.   Discharge Exam: Vitals:   01/04/18 0556 01/04/18 1013  BP: 126/70 120/80  Pulse: 91 98  Resp:  18  Temp: 98.1 F (36.7 C) 98.4 F (36.9 C)  SpO2: 98% 100%   Vitals:   01/03/18 1730 01/03/18 2014 01/04/18 0556 01/04/18 1013  BP: 132/74 116/68 126/70 120/80  Pulse: 84 81 91 98  Resp: 18   18  Temp: 98.2 F (36.8 C) 97.6 F (36.4 C) 98.1 F (36.7 C) 98.4 F (36.9 C)  TempSrc: Oral Oral Oral Oral  SpO2:  98% 98% 100%  Weight:  87.6 kg (193 lb 2 oz)    Height:        General: Not in pain or dyspnea.  Neurology: Awake and alert, non focal  E ENT: mild pallor, no icterus, oral mucosa moist Cardiovascular: No JVD. S1-S2 present, rhythmic, no gallops, rubs, or murmurs. No lower extremity edema. Pulmonary: vesicular breath sounds bilaterally, adequate air movement, no wheezing, rhonchi or rales. Gastrointestinal. Abdomen with mild distention, no organomegaly, non tender, no rebound or guarding. PD catheter in place.  Skin. No rashes Musculoskeletal: no joint deformities Surgical wound with dressing in place.    The results of significant diagnostics from this  hospitalization (including imaging, microbiology, ancillary and laboratory) are listed below for reference.     Microbiology: No results found for this or any previous visit (from the past 240 hour(s)).   Labs: BNP (last 3 results) No results for input(s): BNP in the last 8760 hours. Basic Metabolic Panel: Recent Labs  Lab 01/02/18 0939 01/02/18 1128 01/03/18 1323 01/04/18 0805  NA 139 138 141 141  K 4.8 4.8 4.8 4.4  CL 113* 113* 114* 114*  CO2 13* 13* 14* 17*  GLUCOSE 113* 103* 122* 102*  BUN 137* 140* 117* 114*  CREATININE 7.40* 7.49* 7.02* 7.10*  CALCIUM 9.3  9.1 9.4 10.1 9.8  PHOS 6.0*  --   --   --    Liver Function Tests: Recent Labs  Lab 01/02/18 0939 01/02/18 1128  AST  --  11*  ALT  --  10*  ALKPHOS  --  222*  BILITOT  --  0.3  PROT  --  6.1*  ALBUMIN 3.1* 3.4*   No results for input(s): LIPASE, AMYLASE in the last  168 hours. No results for input(s): AMMONIA in the last 168 hours. CBC: Recent Labs  Lab 01/02/18 0948 01/02/18 1128 01/02/18 2350 01/03/18 1323 01/04/18 0805  WBC  --  8.6 7.6  --  7.0  HGB 5.7* 6.0* 7.7* 8.7* 9.0*  HCT  --  18.1* 23.4* 26.4* 27.4*  MCV  --  90.0 88.6  --  87.8  PLT  --  180 162  --  174   Cardiac Enzymes: No results for input(s): CKTOTAL, CKMB, CKMBINDEX, TROPONINI in the last 168 hours. BNP: Invalid input(s): POCBNP CBG: No results for input(s): GLUCAP in the last 168 hours. D-Dimer No results for input(s): DDIMER in the last 72 hours. Hgb A1c No results for input(s): HGBA1C in the last 72 hours. Lipid Profile No results for input(s): CHOL, HDL, LDLCALC, TRIG, CHOLHDL, LDLDIRECT in the last 72 hours. Thyroid function studies No results for input(s): TSH, T4TOTAL, T3FREE, THYROIDAB in the last 72 hours.  Invalid input(s): FREET3 Anemia work up Recent Labs    01/02/18 0939  FERRITIN 192  TIBC 300  IRON 100   Urinalysis    Component Value Date/Time   COLORURINE yellow 05/06/2007 0905   APPEARANCEUR  Clear 05/06/2007 0905   LABSPEC 1.015 05/06/2007 0905   PHURINE 6.5 05/06/2007 0905   HGBUR 2+ 05/06/2007 0905   BILIRUBINUR neg 04/02/2013 0921   PROTEINUR neg 04/02/2013 0921   UROBILINOGEN negative 04/02/2013 0921   UROBILINOGEN 0.2 05/06/2007 0905   NITRITE neg 04/02/2013 0921   NITRITE negative 05/06/2007 0905   LEUKOCYTESUR Negative 04/02/2013 0921   Sepsis Labs Invalid input(s): PROCALCITONIN,  WBC,  LACTICIDVEN Microbiology No results found for this or any previous visit (from the past 240 hour(s)).   Time coordinating discharge: 45 minutes  SIGNED:   Tawni Millers, MD  [facility] 01/04/2018, 2:12 PM Pager [phone]  If 7PM-7AM, please contact night-coverage [email or web address] Password [email or web address] ]

## 2018-01-04 NOTE — Progress Notes (Signed)
Late Entry: New Admission Note:  Arrival Method: Via wheelchair from ED. Mental Orientation: Alert & Oriented x4 Telemetry: N/A Assessment: Completed Skin: Refer to flowsheet. IV: Right AC infusing RBCs  Pain: 0/10 Tubes: PD cath in LLQ of abdomen. Safety Measures: Safety Fall Prevention Plan discussed with patient. Admission: Completed [facility] [place] Orientation: Patient has been orientated to the room, unit and the staff. Family: Daughter at bedside.   Orders have been reviewed and implemented. Will continue to monitor the patient. Call light has been placed within reach.   Vassie Moselle, RN  Phone Number: [phone]

## 2018-01-04 NOTE — Progress Notes (Signed)
Phillip Blair to be D/C'd Home per MD order.  Discussed prescriptions and follow up appointments with the patient. Prescriptions given to patient, medication list explained in detail. Pt verbalized understanding.  Allergies as of 01/04/2018      Reactions   Ivp Dye [iodinated Diagnostic Agents] Itching, Other (See Comments)   SKIN PEELS   Iodine-131 Itching, Rash      Medication List    TAKE these medications   allopurinol 100 MG tablet Commonly known as:  ZYLOPRIM Take 100 mg by mouth daily.   amLODipine 10 MG tablet Commonly known as:  NORVASC Take 10 mg by mouth every morning.   Azelastine HCl 0.15 % Soln Place 2 sprays into the nose 2 (two) times daily as needed (for allergies).   epoetin alfa 10000 UNIT/ML injection Commonly known as:  EPOGEN,PROCRIT 10,000 Units every 30 (thirty) days.   fluticasone 50 MCG/ACT nasal spray Commonly known as:  FLONASE Place 1 spray into the nose 2 (two) times daily as needed for allergies or rhinitis.   furosemide 40 MG tablet Commonly known as:  LASIX Take 40 mg by mouth 2 (two) times daily.   gentamicin cream 0.1 % Commonly known as:  GARAMYCIN APPLY SMALL AMOUNT TO PD CATHETER EXIT SITE DAILY AFTER CARE   HYDROcodone-acetaminophen 5-325 MG tablet Commonly known as:  NORCO/VICODIN Take 1 tablet by mouth every 4 (four) hours as needed for moderate pain.   ipratropium 0.06 % nasal spray Commonly known as:  ATROVENT Instill 2 sprays into each nostril three times a day as needed for a runny nose   lanthanum 1000 MG chewable tablet Commonly known as:  FOSRENOL Chew 1,000 mg by mouth 3 (three) times daily with meals.   ranitidine 150 MG tablet Commonly known as:  ZANTAC Take 1 tablet (150 mg total) by mouth 2 (two) times daily.   sodium bicarbonate 650 MG tablet Take 1,300 mg by mouth 2 (two) times daily.       Vitals:   01/04/18 0556 01/04/18 1013  BP: 126/70 120/80  Pulse: 91 98  Resp:  18  Temp: 98.1 F (36.7  C) 98.4 F (36.9 C)  SpO2: 98% 100%    Skin clean, dry and intact without evidence of skin break down, no evidence of skin tears noted. IV catheter discontinued intact. Site without signs and symptoms of complications. Dressing and pressure applied. Pt denies pain at this time. No complaints noted.  An After Visit Summary was printed and given to the patient. Patient escorted via [place], and D/C home via private auto.  Dixie Dials RN, BSN

## 2018-01-04 NOTE — Evaluation (Signed)
Physical Therapy Evaluation and Discharge Patient Details Name: Phillip Blair MRN: [id number] DOB: 1955-12-05 Today's Date: 01/04/2018   History of Present Illness  Pt is a 62 y/o male admitted secondary to Upper GI bleed. Pt is s/p EGD on 4/12. Pt with recent PD catheter placement. CT of abdomen revealed multiple R renal mass and lytic lesion to R femoral neck.  PMH includes CKD, CHF, ESRD s/p PD catheter placement on 4/9, and HTN.   Clinical Impression  Patient evaluated by Physical Therapy with no further acute PT needs identified. All education has been completed and the patient has no further questions. Pt overall steady with gait and stair navigation requiring supervision for safety. Pt reports he is at baseline level of function and is ready to go home. See below for any follow-up Physical Therapy or equipment needs. PT is signing off. Thank you for this referral.     Follow Up Recommendations No PT follow up    Equipment Recommendations  None recommended by PT    Recommendations for Other Services       Precautions / Restrictions Precautions Precautions: None Restrictions Weight Bearing Restrictions: No      Mobility  Bed Mobility               General bed mobility comments: Standing in room with wife upon entry.   Transfers                 General transfer comment: Standing in room with wife upon entry   Ambulation/Gait Ambulation/Gait assistance: Supervision Ambulation Distance (Feet): 150 Feet Assistive device: None Gait Pattern/deviations: Step-through pattern;Decreased stride length Gait velocity: WFL Gait velocity interpretation: >2.62 ft/sec, indicative of community ambulatory General Gait Details: Good gait speed and overall steady with ambulation. No LOB noted and pt reports he is at baseline for ambulation.   Stairs Stairs: Yes Stairs assistance: Supervision Stair Management: One rail Right;Alternating pattern;Forwards Number of  Stairs: 10 General stair comments: Safe stair navigation. No LOB noted. Supervision for safety.   Wheelchair Mobility    Modified Rankin (Stroke Patients Only)       Balance Overall balance assessment: No apparent balance deficits (not formally assessed)                                           Pertinent Vitals/Pain Pain Assessment: Faces Faces Pain Scale: Hurts a little bit Pain Location: abdomen  Pain Descriptors / Indicators: Sore Pain Intervention(s): Limited activity within patient's tolerance;Monitored during session;Repositioned    Home Living Family/patient expects to be discharged to:: Private residence Living Arrangements: Spouse/significant other;Children Available Help at Discharge: Family Type of Home: House Home Access: Stairs to enter Entrance Stairs-Rails: Right Entrance Stairs-Number of Steps: 3 Home Layout: Two level Home Equipment: None      Prior Function Level of Independence: Independent               Hand Dominance        Extremity/Trunk Assessment   Upper Extremity Assessment Upper Extremity Assessment: Overall WFL for tasks assessed    Lower Extremity Assessment Lower Extremity Assessment: Overall WFL for tasks assessed    Cervical / Trunk Assessment Cervical / Trunk Assessment: Normal  Communication   Communication: No difficulties  Cognition Arousal/Alertness: Awake/alert Behavior During Therapy: WFL for tasks assessed/performed Overall Cognitive Status: Within Functional Limits for tasks assessed  General Comments General comments (skin integrity, edema, etc.): Pt's wife present during session.     Exercises     Assessment/Plan    PT Assessment Patent does not need any further PT services  PT Problem List         PT Treatment Interventions      PT Goals (Current goals can be found in the Care Plan section)  Acute Rehab PT  Goals Patient Stated Goal: to go home  PT Goal Formulation: With patient Time For Goal Achievement: 01/04/18 Potential to Achieve Goals: Good    Frequency     Barriers to discharge        Co-evaluation               AM-PAC PT "6 Clicks" Daily Activity  Outcome Measure Difficulty turning over in bed (including adjusting bedclothes, sheets and blankets)?: None Difficulty moving from lying on back to sitting on the side of the bed? : None Difficulty sitting down on and standing up from a chair with arms (e.g., wheelchair, bedside commode, etc,.)?: None Help needed moving to and from a bed to chair (including a wheelchair)?: None Help needed walking in hospital room?: None Help needed climbing 3-5 steps with a railing? : None 6 Click Score: 24    End of Session Equipment Utilized During Treatment: Gait belt Activity Tolerance: Patient tolerated treatment well Patient left: with family/visitor present;Other (comment)(standing in room ) Nurse Communication: Mobility status PT Visit Diagnosis: Other abnormalities of gait and mobility (R26.89)    Time: 6659-9357 PT Time Calculation (min) (ACUTE ONLY): 10 min   Charges:   PT Evaluation $PT Eval Low Complexity: 1 Low     PT G Codes:        Leighton Ruff, PT, DPT  Acute Rehabilitation Services  Pager: [phone]   Rudean Hitt 01/04/2018, 9:11 AM

## 2018-01-04 NOTE — Progress Notes (Signed)
Patient ID: Phillip Blair, male   DOB: 1956/07/28, 62 y.o.   MRN: [id number] [facility] Progress Note   Assessment/ Plan:   1.  Symptomatic acute blood loss anemia: Appears consistent to upper GI bleed from what appears to be erosive esophagitis based on endoscopy yesterday.  Hemoglobin improved overnight with PRBC transfusion and without any additional overt loss-appears stable enough for discharge home today.  Will give Aranesp today prior to discharge. 2.  Chronic kidney disease stage V: Elevated BUN likely from recent upper GI bleed-he does not have any uremic symptoms or signs to prompt need for dialysis. 3.  Recent PD catheter placement with repair of abdominal wall hernia: Will touch base with home therapies nursing staff to decide on appropriate timing for PD catheter flush/dressing change. 4.  Hypertension: Blood pressures appear to be within acceptable range, continue to monitor  for adjustment of therapy. 5.  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  Ongoing monitoring for Sensipar.  Subjective:   Reports to be feeling better, some oozing around recent surgical sites/abdominal wall reported.  Denies any hematemesis.   Objective:   BP 126/70 (BP Location: Right Arm)   Pulse 91   Temp 98.1 F (36.7 C) (Oral)   Resp 18   Ht 6' (1.829 m)   Wt 87.6 kg (193 lb 2 oz)   SpO2 98%   BMI 26.19 kg/m   Intake/Output Summary (Last 24 hours) at 01/04/2018 1008 Last data filed at 01/04/2018 0930 Gross per 24 hour  Intake 1138 ml  Output 1350 ml  Net -212 ml   Weight change: -0.08 kg (-2.8 oz)  Physical Exam: Gen: Comfortably resting in bed, wife at bedside CVS: Pulse regular rhythm, normal rate, S1 and S2 normal Resp: Clear to auscultation, no rales/retractions or rhonchi Abd: Soft, obese, with clean abdominal dressings Ext: Trace pretibial edema, palpable thrill left brachial  basilic fistula  Imaging: Ct Abdomen Pelvis Wo Contrast  Result Date: 01/02/2018 CLINICAL DATA:  iron transfusion this morning and he was found to have hgb of 5.7. Pt is a kidney patient will be starting PD soon. Pt alert and oriented in triage, states he feels weak. Pt reports he did bowel prep on Monday and noted his stool was consistently dark, post op EXAM: CT ABDOMEN AND PELVIS WITHOUT CONTRAST TECHNIQUE: Multidetector CT imaging of the abdomen and pelvis was performed following the standard protocol without IV contrast. COMPARISON:  08/23/2016, 02/13/2016 and 10/26/2014. FINDINGS: Lower chest: Mild dependent atelectasis in the lower lobes. Bases otherwise clear. Hepatobiliary: No focal liver abnormality is seen. No gallstones, gallbladder wall thickening, or biliary dilatation. Pancreas: Unremarkable. No pancreatic ductal dilatation or surrounding inflammatory changes. Spleen: Normal in size without focal abnormality. Adrenals/Urinary Tract: No adrenal masses. Left kidney is been surgically removed. There are multiple right renal masses of varying attenuation consistent with a combination of simple and complicated cysts. Appearance is stable prior CT. No collecting system stones. No hydronephrosis. Right ureter normal in course and in caliber. Bladder is unremarkable. Stomach/Bowel: Stomach is within normal limits. Appendix appears normal. No evidence of bowel wall thickening, distention, or inflammatory changes. Vascular/Lymphatic: No significant vascular findings are present. No enlarged abdominal or pelvic lymph nodes. Reproductive: Prostate enlarged measuring 5.2 x 4.7 cm transversely. Other: Peritoneal dialysis catheter curls in the right lower quadrant. No abdominal wall hernia. No ascites. Musculoskeletal: No fracture or acute finding multiple Schmorl's nodes noted along the lumbar spine. Ill-defined  lucent lesion noted in the right femoral neck. This is new since the CT dated 10/26/2014. This may  reflect a brown tumor. It could reflect a plasmacytoma. IMPRESSION: 1. No acute abnormalities within the abdomen or pelvis. No evidence of hemorrhage. 2. Multiple right renal masses consistent with cysts and complicated cysts, unchanged from the most recent prior CT. 3. Lytic lesion in the right femoral neck. This is new since a CT scan dated 10/26/2014. Differential diagnosis includes a brown tumor, plasmacytoma or other lytic neoplastic lesion. 4. Peritoneal dialysis catheter curls in the right mid quadrant. Electronically Signed   By: Lajean Manes M.D.   On: 01/02/2018 14:41   Dg Chest Portable 1 View  Result Date: 01/02/2018 CLINICAL DATA:  Low hemoglobin EXAM: PORTABLE CHEST 1 VIEW COMPARISON:  08/23/2016 FINDINGS: The heart size and mediastinal contours are within normal limits. Angular opacity in the right infrahilar lung, possible atelectasis. Postsurgical changes at the thoracic inlet. IMPRESSION: No acute infiltrate or effusion. Probable right infrahilar atelectasis. Electronically Signed   By: Donavan Foil M.D.   On: 01/02/2018 16:15    Labs: BMET Recent Labs  Lab 01/02/18 0939 01/02/18 1128 01/03/18 1323  NA 139 138 141  K 4.8 4.8 4.8  CL 113* 113* 114*  CO2 13* 13* 14*  GLUCOSE 113* 103* 122*  BUN 137* 140* 117*  CREATININE 7.40* 7.49* 7.02*  CALCIUM 9.3  9.1 9.4 10.1  PHOS 6.0*  --   --    CBC Recent Labs  Lab 01/02/18 1128 01/02/18 2350 01/03/18 1323 01/04/18 0805  WBC 8.6 7.6  --  7.0  HGB 6.0* 7.7* 8.7* 9.0*  HCT 18.1* 23.4* 26.4* 27.4*  MCV 90.0 88.6  --  87.8  PLT 180 162  --  174    Medications:    . allopurinol  100 mg Oral Daily  . amLODipine  10 mg Oral q morning - 10a  . darbepoetin (ARANESP) injection - NON-DIALYSIS  60 mcg Subcutaneous Q Sat-1800  . furosemide  40 mg Oral BID  . gentamicin cream   Topical TID  . ipratropium  1 spray Each Nare TID  . lanthanum  1,000 mg Oral TID WC  . pantoprazole (PROTONIX) IV  40 mg Intravenous Q24H  .  sodium bicarbonate  1,300 mg Oral BID   Elmarie Shiley, MD 01/04/2018, 10:08 AM

## 2018-01-04 NOTE — Progress Notes (Addendum)
Patient ID: Phillip Blair, male   DOB: October 19, 1955, 62 y.o.   MRN: [id number]     Progress Note   Subjective    Feels pretty good- ate solid food, no BM's. Abdomen a little sore -improving HGB up to 9.0    Objective   Vital signs in last 24 hours: Temp:  [97.6 F (36.4 C)-98.4 F (36.9 C)] 98.4 F (36.9 C) (04/13 1013) Pulse Rate:  [81-105] 98 (04/13 1013) Resp:  [18-20] 18 (04/13 1013) BP: (116-138)/(68-80) 120/80 (04/13 1013) SpO2:  [98 %-100 %] 100 % (04/13 1013) Weight:  [193 lb 2 oz (87.6 kg)] 193 lb 2 oz (87.6 kg) (04/12 2014) Last BM Date: (PTA) General:    AA male in NAD Heart:  Regular rate and rhythm; no murmurs Lungs: Respirations even and unlabored, lungs CTA bilaterally Abdomen:  Soft, nontender , incision covered ,some serous fluid under dressing, BS+ Extremities:  Without edema. Neurologic:  Alert and oriented,  grossly normal neurologically. Psych:  Cooperative. Normal mood and affect.  Intake/Output from previous day: 04/12 0701 - 04/13 0700 In: 898 [P.O.:480; Blood:418] Out: 1350 [Urine:1350] Intake/Output this shift: Total I/O In: 240 [P.O.:240] Out: -   Lab Results: Recent Labs    01/02/18 1128 01/02/18 2350 01/03/18 1323 01/04/18 0805  WBC 8.6 7.6  --  7.0  HGB 6.0* 7.7* 8.7* 9.0*  HCT 18.1* 23.4* 26.4* 27.4*  PLT 180 162  --  174   BMET Recent Labs    01/02/18 1128 01/03/18 1323 01/04/18 0805  NA 138 141 141  K 4.8 4.8 4.4  CL 113* 114* 114*  CO2 13* 14* 17*  GLUCOSE 103* 122* 102*  BUN 140* 117* 114*  CREATININE 7.49* 7.02* 7.10*  CALCIUM 9.4 10.1 9.8   LFT Recent Labs    01/02/18 1128  PROT 6.1*  ALBUMIN 3.4*  AST 11*  ALT 10*  ALKPHOS 222*  BILITOT 0.3   PT/INR No results for input(s): LABPROT, INR in the last 72 hours.  Studies/Results: Ct Abdomen Pelvis Wo Contrast  Result Date: 01/02/2018 CLINICAL DATA:  iron transfusion this morning and he was found to have hgb of 5.7. Pt is a kidney patient will be  starting PD soon. Pt alert and oriented in triage, states he feels weak. Pt reports he did bowel prep on Monday and noted his stool was consistently dark, post op EXAM: CT ABDOMEN AND PELVIS WITHOUT CONTRAST TECHNIQUE: Multidetector CT imaging of the abdomen and pelvis was performed following the standard protocol without IV contrast. COMPARISON:  08/23/2016, 02/13/2016 and 10/26/2014. FINDINGS: Lower chest: Mild dependent atelectasis in the lower lobes. Bases otherwise clear. Hepatobiliary: No focal liver abnormality is seen. No gallstones, gallbladder wall thickening, or biliary dilatation. Pancreas: Unremarkable. No pancreatic ductal dilatation or surrounding inflammatory changes. Spleen: Normal in size without focal abnormality. Adrenals/Urinary Tract: No adrenal masses. Left kidney is been surgically removed. There are multiple right renal masses of varying attenuation consistent with a combination of simple and complicated cysts. Appearance is stable prior CT. No collecting system stones. No hydronephrosis. Right ureter normal in course and in caliber. Bladder is unremarkable. Stomach/Bowel: Stomach is within normal limits. Appendix appears normal. No evidence of bowel wall thickening, distention, or inflammatory changes. Vascular/Lymphatic: No significant vascular findings are present. No enlarged abdominal or pelvic lymph nodes. Reproductive: Prostate enlarged measuring 5.2 x 4.7 cm transversely. Other: Peritoneal dialysis catheter curls in the right lower quadrant. No abdominal wall hernia. No ascites. Musculoskeletal: No fracture or acute finding  multiple Schmorl's nodes noted along the lumbar spine. Ill-defined lucent lesion noted in the right femoral neck. This is new since the CT dated 10/26/2014. This may reflect a brown tumor. It could reflect a plasmacytoma. IMPRESSION: 1. No acute abnormalities within the abdomen or pelvis. No evidence of hemorrhage. 2. Multiple right renal masses consistent with  cysts and complicated cysts, unchanged from the most recent prior CT. 3. Lytic lesion in the right femoral neck. This is new since a CT scan dated 10/26/2014. Differential diagnosis includes a brown tumor, plasmacytoma or other lytic neoplastic lesion. 4. Peritoneal dialysis catheter curls in the right mid quadrant. Electronically Signed   By: Lajean Manes M.D.   On: 01/02/2018 14:41   Dg Chest Portable 1 View  Result Date: 01/02/2018 CLINICAL DATA:  Low hemoglobin EXAM: PORTABLE CHEST 1 VIEW COMPARISON:  08/23/2016 FINDINGS: The heart size and mediastinal contours are within normal limits. Angular opacity in the right infrahilar lung, possible atelectasis. Postsurgical changes at the thoracic inlet. IMPRESSION: No acute infiltrate or effusion. Probable right infrahilar atelectasis. Electronically Signed   By: Donavan Foil M.D.   On: 01/02/2018 16:15       Assessment / Plan:    #1 62 yo male with acute on chronic anemia, and black stool  Prior to admit EGD yesterday showed erosive esophagitis as etiology for acute bleed  Patient has not manifested any further active bleeding and has remained stable.  #2 chronic kidney disease stage IV-patient is on transplant list [facility], he is now status post peritoneal dialysis catheter placement last week at [facility] 3 status post ventral hernia repair-that same time his catheter placement #4 lytic lesion noted on CT in the right femoral neck, new since February 2016-rule out brown tumor, plasmacytoma or other lytic neoplasm Will need to be addressed as an outpatient    Plan; Patient is stable from a GI perspective for discharge to home today Discussed antireflux diet, and elevation of the head of the bed with the patient and his wife. Please discharge on Protonix 20 mg p.o. every morning AC breakfast.  He should be treated at least 3 months benefit from longer term either PPI or H2 blocker.   Follow-up hemoglobin in 1 week Patient does not need  acute GI follow-up, but we are happy to see him on an as-needed basis.     Contact  Amy [place], P.A.-C               [phone]     Attending physician's note   I have taken an interval history, reviewed the chart and examined the patient. I agree with the Advanced Practitioner's note, impression and recommendations.  Discussed with patient's family.  Plan to continue Protonix 20 mg (due to CRI) once a day for now.  Trend CBC as outpatient.  Follow-up in the GI clinic as an outpatient.  Will sign off for now.  Carmell Austria, MD

## 2018-04-30 ENCOUNTER — Other Ambulatory Visit (HOSPITAL_COMMUNITY): Payer: Self-pay | Admitting: Surgery

## 2018-04-30 DIAGNOSIS — N2581 Secondary hyperparathyroidism of renal origin: Secondary | ICD-10-CM

## 2018-05-29 ENCOUNTER — Encounter (HOSPITAL_COMMUNITY)
Admission: RE | Admit: 2018-05-29 | Discharge: 2018-05-29 | Disposition: A | Discharge status: Home / Self Care | Payer: BLUE CROSS/BLUE SHIELD | Source: Ambulatory Visit | Attending: Surgery | Admitting: Surgery

## 2018-05-29 ENCOUNTER — Ambulatory Visit (HOSPITAL_COMMUNITY): Payer: BLUE CROSS/BLUE SHIELD

## 2018-05-29 DIAGNOSIS — N2581 Secondary hyperparathyroidism of renal origin: Secondary | ICD-10-CM | POA: Diagnosis not present

## 2018-05-29 MED ORDER — TECHNETIUM TC 99M SESTAMIBI - CARDIOLITE
20.0000 | Freq: Once | INTRAVENOUS | Status: AC | PRN
  Administered 2018-05-29: 09:00:00 20 via INTRAVENOUS

## 2018-07-04 ENCOUNTER — Encounter: Payer: Self-pay | Admitting: Internal Medicine

## 2018-07-04 ENCOUNTER — Ambulatory Visit (INDEPENDENT_AMBULATORY_CARE_PROVIDER_SITE_OTHER): Payer: BLUE CROSS/BLUE SHIELD | Admitting: Internal Medicine

## 2018-07-04 VITALS — BP 94/60 | HR 72 | Ht 72.0 in | Wt 211.2 lb

## 2018-07-04 DIAGNOSIS — R1032 Left lower quadrant pain: Secondary | ICD-10-CM

## 2018-07-04 NOTE — Patient Instructions (Addendum)
  Glad your better.     Follow up with Dr. Carlean Purl as needed.     I appreciate the opportunity to care for you. Silvano Rusk, MD, [facility]

## 2018-07-04 NOTE — Progress Notes (Signed)
Phillip Blair 62 y.o. Oct 30, 1955 [id number]  Assessment & Plan:   Encounter Diagnosis  Name Primary?  Marland Kitchen LLQ pain Yes    I am suspicious that the peritoneal dialysis catheter was probably causing this pain but fortunately it was short-lived and he seems okay now.  Will monitor I do not think there is any need for imaging or other work-up at this time.  I appreciate the opportunity to care for this patient. I will send a copy to Dr. Donato Heinz CC: Jacelyn Grip Edwyna Shell, MD   Subjective:   Chief Complaint: Left lower quadrant pain  HPI The patient is here at the request of Dr. [name] because of left lower quadrant pain.  A few weeks ago he had a very sharp left lower quadrant pain, he spoke to Dr. Arlyce Dice who suggested he try some MiraLAX though the patient was not constipated he has been better since with only twinges of mild pain.  Recent history he had peritoneal dialysis catheter and umbilical hernia repair at [facility] in April, right after that he noticed melena or around that time was found to be significantly anemic had an EGD with grade B esophagitis.  I had performed a colonoscopy in 2018 with a polyp that was a small adenoma and he had pandiverticulosis. His left kidney has been resected he has right cyst with his polycystic disease.  As far as he knows he has not had any infection in his peritoneal dialysate and that is going well at this point.  He feels comfortable at this time.  No bowel habit changes rectal bleeding etc.  Allergies  Allergen Reactions  . Ivp Dye [Iodinated Diagnostic Agents] Itching and Other (See Comments)    SKIN PEELS  . Iodine-131 Itching and Rash   Current Meds  Medication Sig  . allopurinol (ZYLOPRIM) 100 MG tablet Take 100 mg by mouth daily.  Marland Kitchen amLODipine (NORVASC) 10 MG tablet Take 10 mg by mouth every morning.   . Azelastine HCl 0.15 % SOLN Place 2 sprays into the nose 2 (two) times daily as needed (for allergies).   Marland Kitchen b  complex-vitamin c-folic acid (NEPHRO-VITE) 0.8 MG TABS tablet Take 1 tablet by mouth daily.  . calcitRIOL (ROCALTROL) 0.5 MCG capsule Take 0.5 mcg by mouth daily.  . cinacalcet (SENSIPAR) 90 MG tablet Take 90 mg by mouth at bedtime.  . famotidine (PEPCID) 10 MG tablet Take 10 mg by mouth 2 (two) times daily.  . fluticasone (FLONASE) 50 MCG/ACT nasal spray Place 1 spray into the nose 2 (two) times daily as needed for allergies or rhinitis.   Marland Kitchen gentamicin cream (GARAMYCIN) 0.1 % APPLY SMALL AMOUNT TO PD CATHETER EXIT SITE DAILY AFTER CARE  . ipratropium (ATROVENT) 0.06 % nasal spray Instill 2 sprays into each nostril three times a day as needed for a runny nose  . lanthanum (FOSRENOL) 1000 MG chewable tablet Chew 1,000 mg by mouth 3 (three) times daily with meals.    Past Medical History:  Diagnosis Date  . Allergy   . Anemia    low iron  . Cancer ([place])    kidney  . Chronic kidney disease    denies  . Family history of kidney stone   . GERD (gastroesophageal reflux disease)    occ  . Gout   . Hypercalcemia   . Hyperlipidemia   . Hypertension    does not see a cardiologist  . Parathyroid adenoma   . Sleep apnea  no longer uses cpap, has lost weight   Past Surgical History:  Procedure Laterality Date  . AV FISTULA PLACEMENT Left 03/30/2016   Procedure: FIRST STAGE BASILIC VEIN TRANSPOSITION LEFT UPPER ARM;  Surgeon: Serafina Mitchell, MD;  Location: [facility];  Service: Vascular;  Laterality: Left;  . BASCILIC VEIN TRANSPOSITION Left 06/13/2016   Procedure: SECOND STAGE BASILIC VEIN TRANSPOSITION;  Surgeon: Serafina Mitchell, MD;  Location: [place];  Service: Vascular;  Laterality: Left;  . COLONOSCOPY    . ESOPHAGOGASTRODUODENOSCOPY (EGD) WITH PROPOFOL N/A 01/03/2018   Procedure: ESOPHAGOGASTRODUODENOSCOPY (EGD) WITH PROPOFOL;  Surgeon: Jackquline Denmark, MD;  Location: [facility] ENDOSCOPY;  Service: Endoscopy;  Laterality: N/A;  . MINIMALLY INVASIVE RADIOACTIVE PARATHYROIDECTOMY N/A 05/04/2013    Procedure: PARATHYROIDECTOMY MINIMALLY INVASIVE;  Surgeon: Harl Bowie, MD;  Location: [place];  Service: General;  Laterality: N/A;  . MINIMALLY INVASIVE RADIOACTIVE PARATHYROIDECTOMY N/A 10/20/2013   Procedure: MINIMALLY INVASIVE PARATHYROIDECTOMY CONVERTED TO COMPLETE NECK EXPLORATION;  Surgeon: Harl Bowie, MD;  Location: [place];  Service: General;  Laterality: N/A;  . NASAL SEPTOPLASTY W/ TURBINOPLASTY    . ROBOT ASSISTED LAPAROSCOPIC NEPHRECTOMY Left 01/19/2015   Procedure: ROBOTIC ASSISTED LAPAROSCOPIC RADICAL NEPHRECTOMY AND INTRAOPERATIVE ULTRASOUND ;  Surgeon: Alexis Frock, MD;  Location: [facility];  Service: Urology;  Laterality: Left;  Marland Kitchen VASECTOMY     Social History   Social History Narrative   Patient is married, he has 2 children he works in Therapist, art   Occasional alcohol never smoker no drug use   Mother is alive father is deceased   Review of Systems See HPI  Objective:   Physical Exam BP 94/60   Pulse 72   Ht 6' (1.829 m)   Wt 211 lb 4 oz (95.8 kg)   BMI 28.65 kg/m  Well-appearing [place] Asian man no acute distress Eyes are anicteric The abdomen is distended  with peritoneal dialysate fluid and well, there is a catheter implanted and entering the left upper quadrant.  The abdomen is nontender without mass-effect. Well-healed surgical scars in the mid abdomen no hernias Mood and affect are appropriate He is alert and oriented x3

## 2018-07-29 ENCOUNTER — Ambulatory Visit: Payer: BLUE CROSS/BLUE SHIELD

## 2018-07-29 ENCOUNTER — Encounter: Payer: BLUE CROSS/BLUE SHIELD | Attending: Internal Medicine | Admitting: Dietician

## 2018-07-29 ENCOUNTER — Encounter: Payer: Self-pay | Admitting: Dietician

## 2018-07-29 DIAGNOSIS — E669 Obesity, unspecified: Secondary | ICD-10-CM | POA: Diagnosis not present

## 2018-07-29 DIAGNOSIS — E119 Type 2 diabetes mellitus without complications: Secondary | ICD-10-CM | POA: Insufficient documentation

## 2018-07-29 DIAGNOSIS — Z713 Dietary counseling and surveillance: Secondary | ICD-10-CM | POA: Insufficient documentation

## 2018-07-29 NOTE — Progress Notes (Signed)
Patient was seen on 07/29/18 for the first of a series of three diabetes self-management courses at the [facility].  Patient Education Plan per assessed needs and concerns is to attend three course education program for Diabetes Self Management Education.  The following learning objectives were met by the patient during this class:  Describe diabetes  State some common risk factors for diabetes  Defines the role of glucose and insulin  Identifies type of diabetes and pathophysiology  Describe the relationship between diabetes and cardiovascular risk  State the members of the Healthcare Team  States the rationale for glucose monitoring  State when to test glucose  State their individual Target Range  State the importance of logging glucose readings  Describe how to interpret glucose readings  Identifies A1C target  Explain the correlation between A1c and eAG values  State symptoms and treatment of high blood glucose  State symptoms and treatment of low blood glucose  Explain proper technique for glucose testing  Identifies proper sharps disposal  Handouts given during class include:  Living Well with Diabetes  Carb Counting and Meal Planning book  Meal Plan Card  Meal planning worksheet  Low Sodium Flavoring Tips  Types of Fats  The diabetes portion plate  E0F to eAG Conversion Chart  Diabetes Recommended Care Schedule  Support Group  Diabetes Success Plan  Core Class Satisfaction Survey   Follow-Up Plan:  Attend core 2

## 2018-08-05 ENCOUNTER — Ambulatory Visit: Payer: BLUE CROSS/BLUE SHIELD

## 2018-08-05 ENCOUNTER — Encounter: Payer: Self-pay | Admitting: Dietician

## 2018-08-05 ENCOUNTER — Encounter: Payer: BLUE CROSS/BLUE SHIELD | Admitting: Dietician

## 2018-08-05 DIAGNOSIS — E119 Type 2 diabetes mellitus without complications: Secondary | ICD-10-CM

## 2018-08-05 NOTE — Progress Notes (Signed)
Patient was seen on 08/05/18 for the second of a series of three diabetes self-management courses at the [facility]. The following learning objectives were met by the patient during this class:   Describe the role of different macronutrients on glucose  Explain how carbohydrates affect blood glucose  State what foods contain the most carbohydrates  Demonstrate carbohydrate counting  Demonstrate how to read Nutrition Facts food label  Describe effects of various fats on heart health  Describe the importance of good nutrition for health and healthy eating strategies  Describe techniques for managing your shopping, cooking and meal planning  List strategies to follow meal plan when dining out  Describe the effects of alcohol on glucose and how to use it safely  Goals:  Follow Diabetes Meal Plan as instructed  Aim to spread carbs evenly throughout the day  Aim for 3 meals per day and snacks as needed Include lean protein foods to meals/snacks  Monitor glucose levels as instructed by your doctor   Follow-Up Plan:  Attend Core 3  Work towards following your personal food plan.

## 2018-08-12 ENCOUNTER — Ambulatory Visit: Payer: BLUE CROSS/BLUE SHIELD

## 2018-08-12 ENCOUNTER — Encounter: Payer: BLUE CROSS/BLUE SHIELD | Admitting: Dietician

## 2018-08-12 ENCOUNTER — Encounter: Payer: Self-pay | Admitting: Dietician

## 2018-08-12 DIAGNOSIS — E119 Type 2 diabetes mellitus without complications: Secondary | ICD-10-CM | POA: Diagnosis not present

## 2018-08-12 NOTE — Progress Notes (Signed)
Patient was seen on 08/12/18 for the third of a series of three diabetes self-management courses at the [facility].   Phillip Blair the amount of activity recommended for healthy living . Describe activities suitable for individual needs . Identify ways to regularly incorporate activity into daily life . Identify barriers to activity and ways to over come these barriers  Identify diabetes medications being personally used and their primary action for lowering glucose and possible side effects . Describe role of stress on blood glucose and develop strategies to address psychosocial issues . Identify diabetes complications and ways to prevent them  Explain how to manage diabetes during illness . Evaluate success in meeting personal goal . Establish 2-3 goals that they will plan to diligently work on  Goals:   I will count my carb choices at most meals and snacks   I will be active 30 minutes or more 5 times a week  Your patient has identified these potential barriers to change:  Motivation  Your patient has identified their diabetes self-care support plan as  Family Support On-line Resources   Plan:  Attend Support Group as desired

## 2018-08-27 ENCOUNTER — Ambulatory Visit (HOSPITAL_COMMUNITY)
Admission: RE | Admit: 2018-08-27 | Discharge: 2018-08-27 | Disposition: A | Discharge status: Home / Self Care | Payer: BLUE CROSS/BLUE SHIELD | Source: Ambulatory Visit | Attending: Surgery | Admitting: Surgery

## 2018-08-27 DIAGNOSIS — E042 Nontoxic multinodular goiter: Secondary | ICD-10-CM | POA: Diagnosis not present

## 2018-08-27 DIAGNOSIS — N2581 Secondary hyperparathyroidism of renal origin: Secondary | ICD-10-CM | POA: Insufficient documentation

## 2018-08-28 ENCOUNTER — Ambulatory Visit: Payer: BLUE CROSS/BLUE SHIELD

## 2018-09-04 ENCOUNTER — Ambulatory Visit: Payer: BLUE CROSS/BLUE SHIELD

## 2018-09-11 ENCOUNTER — Ambulatory Visit: Payer: BLUE CROSS/BLUE SHIELD

## 2018-09-26 ENCOUNTER — Ambulatory Visit: Payer: Self-pay | Admitting: Surgery

## 2018-10-02 ENCOUNTER — Ambulatory Visit (HOSPITAL_COMMUNITY)
Admission: RE | Admit: 2018-10-02 | Discharge: 2018-10-02 | Disposition: A | Discharge status: Home / Self Care | Payer: 59 | Source: Ambulatory Visit | Attending: Urology | Admitting: Urology

## 2018-10-02 ENCOUNTER — Other Ambulatory Visit (HOSPITAL_COMMUNITY): Payer: Self-pay | Admitting: Urology

## 2018-10-02 DIAGNOSIS — C642 Malignant neoplasm of left kidney, except renal pelvis: Secondary | ICD-10-CM | POA: Diagnosis present

## 2018-10-02 DIAGNOSIS — Z905 Acquired absence of kidney: Secondary | ICD-10-CM | POA: Insufficient documentation

## 2019-04-12 ENCOUNTER — Encounter (HOSPITAL_COMMUNITY): Payer: Self-pay | Admitting: Surgery

## 2019-04-12 DIAGNOSIS — N2581 Secondary hyperparathyroidism of renal origin: Secondary | ICD-10-CM | POA: Diagnosis present

## 2019-04-12 NOTE — H&P (Signed)
General Surgery [facility]  Phillip Blair DOB: 09-Aug-1956 Married / Language: English / Race: Black or African American Male  History of Present Illness  The patient is a 63 year old male who presents with secondary hyperparathyroidism.  CHIEF COMPLAINT: persistent secondary hyperparathyroidism  Patient returns for scheduled follow-up and to discuss operative intervention for persistent secondary hyperparathyroidism. Patient has end-stage renal disease and is currently on peritoneal dialysis. He was evaluated in January and scheduled for surgery but due to the virus pandemic his procedure was postponed. Patient now returns to discuss surgery. He has had 2 prior operations by my partner, Dr. Nedra Hai. He has had removal of 3 parathyroid glands. A normal left superior gland was left in situ. Diagnostic studies including a nuclear medicine parathyroid scan and an ultrasound examination demonstrated a 12 mm nodule in the lateral right thyroid lobe consistent with hypercellular parathyroid tissue. Patient returns to discuss surgery for removal of this parathyroid tissue in hopes of correcting his secondary hyperparathyroidism.   Problem List/Past Medical PERITONEAL DIALYSIS CATHETER IN PLACE (Z99.2)  HISTORY OF RENAL CELL CARCINOMA (Z85.528)  HISTORY OF PARATHYROID SURGERY (Z98.890)  SECONDARY HYPERPARATHYROIDISM, RENAL (N25.81)   Past Surgical History Colon Polyp Removal - Colonoscopy  Dialysis Shunt / Fistula  Nephrectomy  Left. Thyroid Surgery  Vasectomy   Diagnostic Studies History  Colonoscopy  within last year  Allergies  Dyes  Allergies Reconciled   Medication History  Lanthanum Carbonate (1000MG  Tablet Chewable, Oral) Active. Cinacalcet HCl (90MG  Tablet, Oral) Active. Allopurinol (100MG  Tablet, Oral) Active. AmLODIPine Besylate (10MG  Tablet, Oral) Active. Nephro-Vite (0.8MG  Tablet, Oral) Active. Gentamicin Sulfate  (0.1% Cream, External) Active. Ipratropium Bromide (0.06% Solution, Nasal) Active. Fosrenol (1000MG  Tablet Chewable, Oral) Active. Omeprazole (20MG  Capsule ER, Oral) Active. Medications Reconciled  Social History  Caffeine use  Coffee, Tea. Tobacco use  Never smoker.  Other Problems  Chronic Renal Failure Syndrome  Gastroesophageal Reflux Disease  High blood pressure   Vitals  Weight: 221 lb Height: 72in Body Surface Area: 2.22 m Body Mass Index: 29.97 kg/m  Temp.: 98.34F  Pulse: 77 (Regular)  BP: 142/74(Sitting, Left Arm, Standard)  Physical Exam   See vital signs recorded above  GENERAL APPEARANCE Development: normal Nutritional status: normal Gross deformities: none  SKIN Rash, lesions, ulcers: none Induration, erythema: none Nodules: none palpable  EYES Conjunctiva and lids: normal Pupils: equal and reactive Iris: normal bilaterally  EARS, NOSE, MOUTH, THROAT External ears: no lesion or deformity External nose: no lesion or deformity Hearing: grossly normal Lips: no lesion or deformity Dentition: normal for age Oral mucosa: moist  NECK Symmetric: yes Trachea: midline Thyroid: no palpable nodules in the thyroid bed Well-healed anterior cervical incision. Voice quality is normal.  CHEST Respiratory effort: normal Retraction or accessory muscle use: no Breath sounds: normal bilaterally Rales, rhonchi, wheeze: none  CARDIOVASCULAR Auscultation: regular rhythm, normal rate Murmurs: none Pulses: carotid and radial pulse 2+ palpable Lower extremity edema: none Lower extremity varicosities: none  MUSCULOSKELETAL Station and gait: normal Digits and nails: no clubbing or cyanosis Muscle strength: grossly normal all extremities Range of motion: grossly normal all extremities Deformity: none  LYMPHATIC Cervical: none palpable Supraclavicular: none palpable  PSYCHIATRIC Oriented to person, place, and time: yes Mood and  affect: normal for situation Judgment and insight: appropriate for situation    Assessment & Plan  SECONDARY HYPERPARATHYROIDISM, RENAL (N25.81) HISTORY OF PARATHYROID SURGERY (O29.476)  Patient returns to discuss surgery for persistent secondary hyperparathyroidism. Imaging studies indicate a 12  mm area of tissue in the lateral right thyroid lobe consistent with hypercellular parathyroid tissue. The patient and I discussed the operative approach to this area. We discussed removing just a nodule versus having to remove the entire right thyroid lobe. We discussed the risks and benefits of each procedure including the potential need for supplemental thyroid hormone and the potential for injury to recurrent laryngeal nerve. We discussed the location of the surgical incision. We discussed the hospital stay to be anticipated. The patient understands and wishes to proceed with surgery in the near future.  The risks and benefits of the procedure have been discussed at length with the patient. The patient understands the proposed procedure, potential alternative treatments, and the course of recovery to be expected. All of the patient's questions have been answered at this time. The patient wishes to proceed with surgery.  Phillip Blair, [place] [facility] Office: [phone]

## 2019-04-13 ENCOUNTER — Encounter (HOSPITAL_COMMUNITY): Payer: Self-pay

## 2019-04-13 ENCOUNTER — Ambulatory Visit: Payer: Self-pay | Admitting: Surgery

## 2019-04-13 ENCOUNTER — Other Ambulatory Visit (HOSPITAL_COMMUNITY)
Admission: RE | Admit: 2019-04-13 | Discharge: 2019-04-13 | Disposition: A | Discharge status: Home / Self Care | Payer: 59 | Source: Ambulatory Visit | Attending: Surgery | Admitting: Surgery

## 2019-04-13 DIAGNOSIS — Z1159 Encounter for screening for other viral diseases: Secondary | ICD-10-CM | POA: Insufficient documentation

## 2019-04-13 NOTE — Patient Instructions (Signed)
DUE TO COVID-19 ONLY ONE VISITOR IS ALLOWED IN THE HOSPITAL AT THIS TIME   COVID SWAB TESTING MUST BE COMPLETED ON: April 13, 2019 (Must self quarantine after testing. Follow instructions on handout.)   Your procedure is scheduled on: Thursday, April 16, 2019   Surgery Time:  9:30AM-11:30AM   Report to [facility]  Entrance    Report to admitting at 7:30 AM   Call this number if you have problems the morning of surgery [phone]   Do not eat food or drink liquids :After Midnight.   Brush your teeth the morning of surgery.   Do NOT smoke after Midnight   Take these medicines the morning of surgery with A SIP OF WATER: Amlodipine, Pantoprazole, Allopurinol   Use inhaler and nasal spray per normal routine              You may not have any metal on your body including jewelry, and body piercings             Do not wear lotions, powders, perfumes/cologne, or deodorant                           Men may shave face and neck.   Do not bring valuables to the hospital. [place].   Contacts, dentures or bridgework may not be worn into surgery.   Bring small overnight bag day of surgery.    Special Instructions: Bring a copy of your healthcare power of attorney and living will documents         the day of surgery if you haven't scanned them in before.              Please read over the following fact sheets you were given:  [facility] - Preparing for Surgery Before surgery, you can play an important role.  Because skin is not sterile, your skin needs to be as free of germs as possible.  You can reduce the number of germs on your skin by washing with CHG (chlorahexidine gluconate) soap before surgery.  CHG is an antiseptic cleaner which kills germs and bonds with the skin to continue killing germs even after washing. Please DO NOT use if you have an allergy to CHG or antibacterial soaps.  If your skin becomes  reddened/irritated stop using the CHG and inform your nurse when you arrive at Short Stay. Do not shave (including legs and underarms) for at least 48 hours prior to the first CHG shower.  You may shave your face/neck.  Please follow these instructions carefully:  1.  Shower with CHG Soap the night before surgery and the  morning of surgery.  2.  If you choose to wash your hair, wash your hair first as usual with your normal  shampoo.  3.  After you shampoo, rinse your hair and body thoroughly to remove the shampoo.                             4.  Use CHG as you would any other liquid soap.  You can apply chg directly to the skin and wash.  Gently with a scrungie or clean washcloth.  5.  Apply the CHG Soap to your body ONLY FROM THE NECK DOWN.   Do   not  use on face/ open                           Wound or open sores. Avoid contact with eyes, ears mouth and   genitals (private parts).                       Wash face,  Genitals (private parts) with your normal soap.             6.  Wash thoroughly, paying special attention to the area where your    surgery  will be performed.  7.  Thoroughly rinse your body with warm water from the neck down.  8.  DO NOT shower/wash with your normal soap after using and rinsing off the CHG Soap.                9.  Pat yourself dry with a clean towel.            10.  Wear clean pajamas.            11.  Place clean sheets on your bed the night of your first shower and do not  sleep with pets. Day of Surgery : Do not apply any lotions/deodorants the morning of surgery.  Please wear clean clothes to the hospital/surgery center.  FAILURE TO FOLLOW THESE INSTRUCTIONS MAY RESULT IN THE CANCELLATION OF YOUR SURGERY  PATIENT SIGNATURE_________________________________  NURSE SIGNATURE__________________________________  ________________________________________________________________________

## 2019-04-13 NOTE — Progress Notes (Signed)
Spoke with Mardene Celeste at Dr. Gala Lewandowsky office to request per op orders.  Sent a fax to [facility] to request EKG tracing   The following are in care everywhere: EKG 10/29/2018 Stress test 02/25/2019 Cardiac cath 02/06/2019 CXR 10/29/2018

## 2019-04-14 ENCOUNTER — Encounter (HOSPITAL_COMMUNITY)
Admission: RE | Admit: 2019-04-14 | Discharge: 2019-04-14 | Disposition: A | Discharge status: Home / Self Care | Payer: 59 | Source: Ambulatory Visit | Attending: Family Medicine | Admitting: Family Medicine

## 2019-04-14 HISTORY — DX: Malignant neoplasm of unspecified kidney, except renal pelvis: C64.9

## 2019-04-14 LAB — SARS CORONAVIRUS 2 (TAT 6-24 HRS): SARS Coronavirus 2: NEGATIVE

## 2019-04-14 NOTE — Pre-Procedure Instructions (Signed)
Phillip Blair  04/14/2019      [id number] - [place] [address], [address] [place] [address] Phone: [phone] Fax: [phone]    Your procedure is scheduled on April 16, 2019.  Report to [facility] Entrance "A" at 730 AM.  Call this number if you have problems the morning of surgery:  [phone]   Call [phone] if you have any questions prior to your surgery date Monday-Friday 8am-4pm    Remember:  Do not eat or drink after midnight.    Take these medicines the morning of surgery with A SIP OF WATER  Allopurinol (zyloprim) Amlodipine (norvasc) Wixela Inhaler Atrovent Nasal spray Lanthanum Pantoprazole (protonix) Sodium Bicabonate  Bring inhalers with you  Beginning now, STOP taking any Aspirin (unless otherwise instructed by your surgeon), Aleve, Naproxen, Ibuprofen, Motrin, Advil, Goody's, BC's, all herbal medications, fish oil, and all vitamins    Day of surgery:  Do not wear jewelry  Do not wear lotions, powders, or colognes, or deodorant.  Men may shave face and neck.  Do not bring valuables to the hospital   [facility] is not responsible for any belongings or valuables. IF you are a smoker, DO NOT Smoke 24 hours prior to surgery   IF you wear a CPAP at night please bring your mask, tubing, and machine the morning of surgery    Remember that you must have someone to transport you home after your surgery, and remain with you for 24 hours if you are discharged the same day.  Contacts, dentures or bridgework may not be worn into surgery.  Leave your suitcase in the car.  After surgery it may be brought to your room.  For patients admitted to the hospital, discharge time will be determined by your treatment team.  Patients discharged the day of surgery will not be allowed to drive home.    [facility]- Preparing For Surgery  Before surgery, you can play an important role. Because skin is not sterile, your skin  needs to be as free of germs as possible. You can reduce the number of germs on your skin by washing with CHG (chlorahexidine gluconate) Soap before surgery.  CHG is an antiseptic cleaner which kills germs and bonds with the skin to continue killing germs even after washing.    Oral Hygiene is also important to reduce your risk of infection.  Remember - BRUSH YOUR TEETH THE MORNING OF SURGERY WITH YOUR REGULAR TOOTHPASTE  Please do not use if you have an allergy to CHG or antibacterial soaps. If your skin becomes reddened/irritated stop using the CHG.  Do not shave (including legs and underarms) for at least 48 hours prior to first CHG shower. It is OK to shave your face.  Please follow these instructions carefully.   1. Shower the NIGHT BEFORE SURGERY and the MORNING OF SURGERY with CHG.   2. If you chose to wash your hair, wash your hair first as usual with your normal shampoo.  3. After you shampoo, rinse your hair and body thoroughly to remove the shampoo.  4. Use CHG as you would any other liquid soap. You can apply CHG directly to the skin and wash gently with a scrungie or a clean washcloth.   5. Apply the CHG Soap to your body ONLY FROM THE NECK DOWN.  Do not use on open wounds or open sores. Avoid contact with your eyes, ears, mouth and genitals (private parts). Wash Face  and genitals (private parts)  with your normal soap.  6. Wash thoroughly, paying special attention to the area where your surgery will be performed.  7. Thoroughly rinse your body with warm water from the neck down.  8. DO NOT shower/wash with your normal soap after using and rinsing off the CHG Soap.  9. Pat yourself dry with a CLEAN TOWEL.  10. Wear CLEAN PAJAMAS to bed the night before surgery, wear comfortable clothes the morning of surgery  11. Place CLEAN SHEETS on your bed the night of your first shower and DO NOT SLEEP WITH PETS.  Day of Surgery:  Do not apply any deodorants/lotions.  Please wear  clean clothes to the hospital/surgery center.   Remember to brush your teeth WITH YOUR REGULAR TOOTHPASTE.  Please read over the following fact sheets that you were given.

## 2019-04-15 ENCOUNTER — Ambulatory Visit (HOSPITAL_COMMUNITY)
Admission: RE | Admit: 2019-04-15 | Discharge: 2019-04-15 | Disposition: A | Discharge status: Home / Self Care | Payer: 59 | Source: Ambulatory Visit | Attending: Anesthesiology | Admitting: Anesthesiology

## 2019-04-15 ENCOUNTER — Encounter (HOSPITAL_COMMUNITY): Payer: Self-pay

## 2019-04-15 ENCOUNTER — Other Ambulatory Visit: Payer: Self-pay

## 2019-04-15 ENCOUNTER — Encounter (HOSPITAL_COMMUNITY)
Admission: RE | Admit: 2019-04-15 | Discharge: 2019-04-15 | Disposition: A | Discharge status: Home / Self Care | Payer: 59 | Source: Ambulatory Visit | Attending: Neurosurgery | Admitting: Neurosurgery

## 2019-04-15 DIAGNOSIS — Z01818 Encounter for other preprocedural examination: Secondary | ICD-10-CM | POA: Insufficient documentation

## 2019-04-15 HISTORY — DX: Atherosclerotic heart disease of native coronary artery without angina pectoris: I25.10

## 2019-04-15 HISTORY — DX: End stage renal disease: N18.6

## 2019-04-15 LAB — BASIC METABOLIC PANEL
Anion gap: 16 — ABNORMAL HIGH (ref 5–15)
BUN: 72 mg/dL — ABNORMAL HIGH (ref 8–23)
CO2: 25 mmol/L (ref 22–32)
Calcium: 10.8 mg/dL — ABNORMAL HIGH (ref 8.9–10.3)
Chloride: 101 mmol/L (ref 98–111)
Creatinine, Ser: 11.75 mg/dL — ABNORMAL HIGH (ref 0.61–1.24)
GFR calc Af Amer: 5 mL/min — ABNORMAL LOW (ref >60)
GFR calc non Af Amer: 4 mL/min — ABNORMAL LOW (ref >60)
Glucose, Bld: 130 mg/dL — ABNORMAL HIGH (ref 70–99)
Potassium: 4 mmol/L (ref 3.5–5.1)
Sodium: 142 mmol/L (ref 135–145)

## 2019-04-15 LAB — CBC
HCT: 33.7 % — ABNORMAL LOW (ref 39.0–52.0)
Hemoglobin: 10.6 g/dL — ABNORMAL LOW (ref 13.0–17.0)
MCH: 30.7 pg (ref 26.0–34.0)
MCHC: 31.5 g/dL (ref 30.0–36.0)
MCV: 97.7 fL (ref 80.0–100.0)
Platelets: 270 10*3/uL (ref 150–400)
RBC: 3.45 MIL/uL — ABNORMAL LOW (ref 4.22–5.81)
RDW: 15.2 % (ref 11.5–15.5)
WBC: 9.3 10*3/uL (ref 4.0–10.5)
nRBC: 0.3 % — ABNORMAL HIGH (ref 0.0–0.2)

## 2019-04-15 NOTE — Progress Notes (Addendum)
  Coronavirus Screening Tested negative for COVID on 04/13/19 Have you experienced the following symptoms:  Cough yes/no: No Fever (>100.20F)  yes/no: No Runny nose yes/no: No Sore throat yes/no: No Difficulty breathing/shortness of breath  yes/no: No Have you or a family member traveled in the last 14 days and where? yes/no: No  PCP - Dr. Yaakov Guthrie  Cardiologist - Dr. Barnie Mort [facility])  [facility]  Chest x-ray - today  EKG -10-29-18 (CE)-tracing requested Records requested from cardiology office at [facility]. Stress Test - 03/07/19  ECHO - 10/18  Cardiac Cath - 02/06/19  AICD-denies PM-denies LOOP-denies  Sleep Study - Y CPAP - Stopped using CPAP 2-3 yrs back.  LABS-CBC,BMP James,PA aware of abnormal labs. ASA-denies  ERAS-NA  HA1C-denies Fasting Blood Sugar -  Checks Blood Sugar _0____ times a day  Anesthesia-Y. Cardiac history.Pt on peritoneal dialysis at home daily.  Pt denies having chest pain,palpitations, sob, or fever at this time. All instructions explained to the pt, with a verbal understanding of the material. Pt agrees to go over the instructions while at home for a better understanding. Pt also instructed to self quarantine after being tested for COVID-19. The opportunity to ask questions was provided.

## 2019-04-15 NOTE — Progress Notes (Addendum)
Anesthesia Chart Review:  Case: [id number] Date/Time: 04/16/19 0915   Procedures:      NECK EXPLORATION AND PARATHYROIDECTOMY (N/A )     POSSIBLE RIGHT THYROID LOBECTOMY (Right )   Anesthesia type: General   Pre-op diagnosis: SECONDARY HYPERPARATHYROIDISM   Location: [facility] OR ROOM 08 / [place] [facility]   Surgeon: Armandina Gemma, MD      DISCUSSION: Patient is a 63 year old male scheduled for the above procedure.  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  He has ongoing renal transplant evaluation with [facility]. He recently had cardiac testing as outlined below. Although 01/2019 cardiac cath showed occluded RCA, no PCI recommended at that time, and he was cleared to undergo future renal transplant. He had a follow-up stress test on 02/25/19 (no details regarding why) and results were equivocal but categorized as "low" risk. Upcoming encounters in [place] suggest he has a coronary angiogram scheduled for 05/08/19, but otherwise no details known. Due to this, I called and spoke with nurse Faith at cardiologist Dr. Ferdinand Lango' office regarding surgery plans and inquiring about any additional testing that may be needed prior to parathyroidectomy. She communicated with Dr. Ferdinand Lango, who wrote, "Yes, he is cleared for surgery from a cardiac perspective." (Copy on hard chart.)  04/13/2019 presurgical COVID-19 test was negative. He denied SOB, chest pain, fever at PAT RN interview. Based on available information, I would anticipate that he can proceed as planned if no acute changes. He is for ISTAT4 or equivalent on  arrival. Reviewed cardiac testing/clearance with anesthesiologist Albertha Ghee, MD.    VS: BP 128/68   Pulse 93   Temp 36.8 C (Temporal)   Resp 20   Ht 6' (1.829 m)   Wt 99.5 kg   SpO2 98%   BMI 29.74 kg/m     PROVIDERS: [name], MD is PCP - Barbaraann Boys, MD is cardiologist ([place]) - Donato Heinz, MD is nephrologist - Urologist is with [facility]   LABS: Preoperative labs noted. Cr 11.75. Ca 10.8. K 4.0. H/H 10.6/33.7, PLT 270. He will get ISTAT4 or equivalent on the day of surgery given ESRD.  (all labs ordered are listed, but only abnormal results are displayed)  Labs Reviewed  BASIC METABOLIC PANEL - Abnormal; Notable for the following components:      Result Value   Glucose, Bld 130 (*)    BUN 72 (*)    Creatinine, Ser 11.75 (*)    Calcium 10.8 (*)    GFR calc non Af Amer 4 (*)    GFR calc Af Amer 5 (*)    Anion gap 16 (*)    All other components within normal limits  CBC - Abnormal; Notable for the following components:   RBC 3.45 (*)    Hemoglobin 10.6 (*)    HCT 33.7 (*)    nRBC 0.3 (*)    All other components within normal limits     IMAGES: CXR 04/15/19: Report still PENDING as of 04/15/19 5:04 PM.   EKG: 10/29/18: Tracing from [facility] on hard chart.  Result Narrative: Ventricular Rate          71    BPM  Atrial Rate            71    BPM          P-R Interval            184    ms          QRS Duration            76    ms          Q-T Interval            424    ms          QTC                460    ms          P Axis               65    degrees        R Axis               67    degrees        T Axis               43    degrees        Sinus rhythm Normal ECG When compared with ECG of 09-May-2017  10:59, No significant change was found Confirmed by HAISTY, DR. W. K. ([id number]) on 10/29/2018 1:18:00 PM    CV: Nuclear stress test 02/25/19 [facility]): IMPRESSION: 1. Decreased uptake along the inferior wall on the stress images. Findings are equivocal for reversibility. Inferior wall findings could be related to diaphragmatic attenuation but uncertain. 2. Normal left ventricular wall motion. 3. Left ventricular ejection fraction is 71%. 4. Non invasive risk stratification*: Low 2012 Appropriate Use Criteria for Coronary Revascularization Focused Update: J Am Coll Cardiol. 9242;68(3):419-622. http://content.airportbarriers.com.aspx?articleid=1201161   Cardiac cath 02/06/19 [facility]): Result Narrative Non-obstructive coronary artery disease. LCP for positive stress test (LV dilation post stress) as part of renal  transplant workup. Of note, patient not having chest pain or dyspnea on  exertion.  Right radial artery  Mild non obstructive disease on left side.  100% mid RCA with left to right collaterals.  No indication for PCI at this time.  Patient with no cardiac contraindications for continuing towards renal  transplant.    Stress Echo 11/27/18 [facility] CE): STRESS ECHO There was decrease in left ventricular function post exercise. Positive  exercise echocardiography for inducible ischemia at heart rate achieved.   Echo 11/27/18 [facility]): SUMMARY The left ventricular size is normal. Mild left ventricular hypertrophy Left ventricular systolic function is normal. LV ejection fraction = 60-65%. Left ventricular filling pattern is prolonged relaxation. The right ventricle is normal size. The right ventricular systolic function is normal. The left atrium is mildly dilated. There is mild mitral regurgitation. No significant stenosis seen There was insufficient TR detected to calculate RV systolic pressure. Estimated right atrial pressure is 5 mmHg.Marland Kitchen There is no pericardial  effusion. Probably no significant change in comparison with the prior study noted  Carotid Duplex 11/27/18 [facility]):  Impression: Right: Less than 21% diameter reduction of the right proximal ICA. No evidence of hemodynamically significant ICA stenosis. The right vertebral artery flow is antegrade. Left: Less than 21% diameter reduction of the left proximal ICA. No evidence of hemodynamically significant ICA stenosis. The left vertebral artery flow is antegrade.    Past Medical History:  Diagnosis Date  . Allergy   .  Anemia    low iron  . Chronic kidney disease    denies  . Coronary artery disease    02/06/19: Mild non-obstructive disease on left, 100% mid RCA with left-to-right collaterals. No PCI. [facility])  . ESRD (end stage renal disease) ([place])    s/p LUE basilic vein transpositiono 2017; s/p peritoneal dialysis catheter 12/31/17  . Family history of kidney stone   . GERD (gastroesophageal reflux disease)    occ  . Gout   . Hypercalcemia   . Hyperlipidemia   . Hypertension    does not see a cardiologist  . Parathyroid adenoma   . Renal cancer ([place])    kidney  . Sleep apnea    no longer uses cpap, has lost weight    Past Surgical History:  Procedure Laterality Date  . AV FISTULA PLACEMENT Left 03/30/2016   Procedure: FIRST STAGE BASILIC VEIN TRANSPOSITION LEFT UPPER ARM;  Surgeon: Serafina Mitchell, MD;  Location: [place];  Service: Vascular;  Laterality: Left;  . BASCILIC VEIN TRANSPOSITION Left 06/13/2016   Procedure: SECOND STAGE BASILIC VEIN TRANSPOSITION;  Surgeon: Serafina Mitchell, MD;  Location: [place];  Service: Vascular;  Laterality: Left;  . COLONOSCOPY    . ESOPHAGOGASTRODUODENOSCOPY (EGD) WITH PROPOFOL N/A 01/03/2018   Procedure: ESOPHAGOGASTRODUODENOSCOPY (EGD) WITH PROPOFOL;  Surgeon: Jackquline Denmark, MD;  Location: [facility] ENDOSCOPY;  Service: Endoscopy;  Laterality: N/A;  . MINIMALLY INVASIVE RADIOACTIVE PARATHYROIDECTOMY N/A 05/04/2013   Procedure: PARATHYROIDECTOMY MINIMALLY  INVASIVE;  Surgeon: Harl Bowie, MD;  Location: [place];  Service: General;  Laterality: N/A;  . MINIMALLY INVASIVE RADIOACTIVE PARATHYROIDECTOMY N/A 10/20/2013   Procedure: MINIMALLY INVASIVE PARATHYROIDECTOMY CONVERTED TO COMPLETE NECK EXPLORATION;  Surgeon: Harl Bowie, MD;  Location: [place];  Service: General;  Laterality: N/A;  . NASAL SEPTOPLASTY W/ TURBINOPLASTY    . ROBOT ASSISTED LAPAROSCOPIC NEPHRECTOMY Left 01/19/2015   Procedure: ROBOTIC ASSISTED LAPAROSCOPIC RADICAL NEPHRECTOMY AND INTRAOPERATIVE ULTRASOUND ;  Surgeon: Alexis Frock, MD;  Location: [facility];  Service: Urology;  Laterality: Left;  Marland Kitchen VASECTOMY      MEDICATIONS: . allopurinol (ZYLOPRIM) 100 MG tablet  . amLODipine (NORVASC) 10 MG tablet  . b complex-vitamin c-folic acid (NEPHRO-VITE) 0.8 MG TABS tablet  . ferric citrate (AURYXIA) 1 GM 210 MG(Fe) tablet  . Fluticasone-Salmeterol (WIXELA INHUB) 250-50 MCG/DOSE AEPB  . ipratropium (ATROVENT) 0.06 % nasal spray  . lanthanum (FOSRENOL) 1000 MG chewable tablet  . pantoprazole (PROTONIX) 40 MG tablet  . rosuvastatin (CRESTOR) 5 MG tablet  . sodium bicarbonate 325 MG tablet   No current facility-administered medications for this encounter.      Myra Gianotti, PA-C Surgical Short Stay/Anesthesiology [facility] Phone [phone] [facility] Phone [phone] 04/15/2019 5:04 PM

## 2019-04-15 NOTE — Anesthesia Preprocedure Evaluation (Addendum)
Anesthesia Evaluation  Patient identified by MRN, date of birth, ID band Patient awake    Reviewed: Allergy & Precautions, NPO status , Patient's Chart, lab work & pertinent test results  Airway Mallampati: II  TM Distance: >3 FB Neck ROM: Full    Dental  (+) Teeth Intact, Dental Advisory Given   Pulmonary    breath sounds clear to auscultation       Cardiovascular hypertension,  Rhythm:Regular Rate:Normal     Neuro/Psych    GI/Hepatic   Endo/Other    Renal/GU      Musculoskeletal   Abdominal   Peds  Hematology   Anesthesia Other Findings   Reproductive/Obstetrics                            Anesthesia Physical Anesthesia Plan  ASA: III  Anesthesia Plan: General   Post-op Pain Management:    Induction: Intravenous  PONV Risk Score and Plan: Ondansetron and Dexamethasone  Airway Management Planned: Oral ETT  Additional Equipment:   Intra-op Plan:   Post-operative Plan: Extubation in OR  Informed Consent: I have reviewed the patients History and Physical, chart, labs and discussed the procedure including the risks, benefits and alternatives for the proposed anesthesia with the patient or authorized representative who has indicated his/her understanding and acceptance.       Plan Discussed with: CRNA and Anesthesiologist  Anesthesia Plan Comments: (PAT note written 04/15/2019 by Myra Gianotti, PA-C. )       Anesthesia Quick Evaluation

## 2019-04-16 ENCOUNTER — Encounter (HOSPITAL_COMMUNITY): Payer: Self-pay

## 2019-04-16 ENCOUNTER — Ambulatory Visit (HOSPITAL_COMMUNITY): Payer: 59 | Admitting: Physician Assistant

## 2019-04-16 ENCOUNTER — Encounter (HOSPITAL_COMMUNITY)
Admission: AD | Disposition: A | Discharge status: Home / Self Care | Payer: Self-pay | Source: Ambulatory Visit | Attending: Surgery

## 2019-04-16 ENCOUNTER — Ambulatory Visit (HOSPITAL_COMMUNITY): Payer: 59 | Admitting: Certified Registered"

## 2019-04-16 ENCOUNTER — Inpatient Hospital Stay (HOSPITAL_COMMUNITY)
Admission: AD | Admit: 2019-04-16 | Discharge: 2019-04-18 | DRG: 674 | Disposition: A | Discharge status: Home / Self Care | Payer: 59 | Attending: Surgery | Admitting: Surgery

## 2019-04-16 DIAGNOSIS — N186 End stage renal disease: Secondary | ICD-10-CM | POA: Diagnosis present

## 2019-04-16 DIAGNOSIS — Z992 Dependence on renal dialysis: Secondary | ICD-10-CM | POA: Diagnosis not present

## 2019-04-16 DIAGNOSIS — D351 Benign neoplasm of parathyroid gland: Secondary | ICD-10-CM | POA: Diagnosis present

## 2019-04-16 DIAGNOSIS — Z7951 Long term (current) use of inhaled steroids: Secondary | ICD-10-CM

## 2019-04-16 DIAGNOSIS — K219 Gastro-esophageal reflux disease without esophagitis: Secondary | ICD-10-CM | POA: Diagnosis present

## 2019-04-16 DIAGNOSIS — G4733 Obstructive sleep apnea (adult) (pediatric): Secondary | ICD-10-CM | POA: Diagnosis present

## 2019-04-16 DIAGNOSIS — M109 Gout, unspecified: Secondary | ICD-10-CM | POA: Diagnosis present

## 2019-04-16 DIAGNOSIS — D649 Anemia, unspecified: Secondary | ICD-10-CM | POA: Diagnosis present

## 2019-04-16 DIAGNOSIS — I120 Hypertensive chronic kidney disease with stage 5 chronic kidney disease or end stage renal disease: Secondary | ICD-10-CM | POA: Diagnosis present

## 2019-04-16 DIAGNOSIS — Z905 Acquired absence of kidney: Secondary | ICD-10-CM | POA: Diagnosis not present

## 2019-04-16 DIAGNOSIS — Z79899 Other long term (current) drug therapy: Secondary | ICD-10-CM

## 2019-04-16 DIAGNOSIS — Z85528 Personal history of other malignant neoplasm of kidney: Secondary | ICD-10-CM | POA: Diagnosis not present

## 2019-04-16 DIAGNOSIS — I2510 Atherosclerotic heart disease of native coronary artery without angina pectoris: Secondary | ICD-10-CM | POA: Diagnosis present

## 2019-04-16 DIAGNOSIS — E785 Hyperlipidemia, unspecified: Secondary | ICD-10-CM | POA: Diagnosis present

## 2019-04-16 DIAGNOSIS — N2581 Secondary hyperparathyroidism of renal origin: Secondary | ICD-10-CM | POA: Diagnosis present

## 2019-04-16 HISTORY — PX: PARATHYROIDECTOMY: SHX19

## 2019-04-16 HISTORY — PX: THYROID LOBECTOMY: SHX420

## 2019-04-16 LAB — POCT I-STAT 4, (NA,K, GLUC, HGB,HCT)
Glucose, Bld: 107 mg/dL — ABNORMAL HIGH (ref 70–99)
HCT: 32 % — ABNORMAL LOW (ref 39.0–52.0)
Hemoglobin: 10.9 g/dL — ABNORMAL LOW (ref 13.0–17.0)
Potassium: 4.2 mmol/L (ref 3.5–5.1)
Sodium: 137 mmol/L (ref 135–145)

## 2019-04-16 SURGERY — PARATHYROIDECTOMY
Anesthesia: General | Site: Neck | Laterality: Right

## 2019-04-16 MED ORDER — HYDROCODONE-ACETAMINOPHEN 5-325 MG PO TABS
ORAL_TABLET | ORAL | Status: AC
  Filled 2019-04-16: qty 2

## 2019-04-16 MED ORDER — MIDAZOLAM HCL 2 MG/2ML IJ SOLN
INTRAMUSCULAR | Status: AC
  Filled 2019-04-16: qty 2

## 2019-04-16 MED ORDER — MIDAZOLAM HCL 5 MG/5ML IJ SOLN
INTRAMUSCULAR | Status: DC | PRN
  Administered 2019-04-16: 1 mg via INTRAVENOUS

## 2019-04-16 MED ORDER — EPHEDRINE 5 MG/ML INJ
INTRAVENOUS | Status: AC
  Filled 2019-04-16: qty 10

## 2019-04-16 MED ORDER — DEXAMETHASONE SODIUM PHOSPHATE 10 MG/ML IJ SOLN
INTRAMUSCULAR | Status: DC | PRN
  Administered 2019-04-16: 5 mg via INTRAVENOUS

## 2019-04-16 MED ORDER — TRAMADOL HCL 50 MG PO TABS: 50.0000 mg | ORAL_TABLET | Freq: Two times a day (BID) | ORAL | Status: DC | PRN

## 2019-04-16 MED ORDER — PROPOFOL 10 MG/ML IV BOLUS
INTRAVENOUS | Status: AC
  Filled 2019-04-16: qty 20

## 2019-04-16 MED ORDER — PROPOFOL 10 MG/ML IV BOLUS
INTRAVENOUS | Status: DC | PRN
  Administered 2019-04-16: 30 mg via INTRAVENOUS
  Administered 2019-04-16: 50 mg via INTRAVENOUS
  Administered 2019-04-16: 30 mg via INTRAVENOUS
  Administered 2019-04-16: 170 mg via INTRAVENOUS

## 2019-04-16 MED ORDER — IPRATROPIUM BROMIDE 0.06 % NA SOLN
2.0000 | Freq: Four times a day (QID) | NASAL | Status: DC
  Administered 2019-04-18 (×2): 2 via NASAL
  Filled 2019-04-16: qty 15

## 2019-04-16 MED ORDER — SODIUM BICARBONATE 650 MG PO TABS
650.0000 mg | ORAL_TABLET | Freq: Two times a day (BID) | ORAL | Status: DC
  Administered 2019-04-16 – 2019-04-18 (×5): 650 mg via ORAL
  Filled 2019-04-16 (×6): qty 1

## 2019-04-16 MED ORDER — DEXAMETHASONE SODIUM PHOSPHATE 10 MG/ML IJ SOLN
INTRAMUSCULAR | Status: AC
  Filled 2019-04-16: qty 1

## 2019-04-16 MED ORDER — FENTANYL CITRATE (PF) 250 MCG/5ML IJ SOLN
INTRAMUSCULAR | Status: AC
  Filled 2019-04-16: qty 5

## 2019-04-16 MED ORDER — LIDOCAINE 2% (20 MG/ML) 5 ML SYRINGE
INTRAMUSCULAR | Status: DC | PRN
  Administered 2019-04-16: 60 mg via INTRAVENOUS
  Administered 2019-04-16: 40 mg via INTRAVENOUS

## 2019-04-16 MED ORDER — ONDANSETRON 4 MG PO TBDP: 4.0000 mg | ORAL_TABLET | Freq: Four times a day (QID) | ORAL | Status: DC | PRN

## 2019-04-16 MED ORDER — FENTANYL CITRATE (PF) 100 MCG/2ML IJ SOLN
INTRAMUSCULAR | Status: DC | PRN
  Administered 2019-04-16: 25 ug via INTRAVENOUS
  Administered 2019-04-16: 50 ug via INTRAVENOUS
  Administered 2019-04-16: 25 ug via INTRAVENOUS
  Administered 2019-04-16: 50 ug via INTRAVENOUS
  Administered 2019-04-16: 25 ug via INTRAVENOUS

## 2019-04-16 MED ORDER — CALCITRIOL 0.5 MCG PO CAPS
1.0000 ug | ORAL_CAPSULE | Freq: Every day | ORAL | Status: DC
  Administered 2019-04-16 – 2019-04-18 (×3): 1 ug via ORAL
  Filled 2019-04-16 (×3): qty 2

## 2019-04-16 MED ORDER — SODIUM CHLORIDE 0.9 % IV SOLN
INTRAVENOUS | Status: DC | PRN
  Administered 2019-04-16: 40 ug/min via INTRAVENOUS

## 2019-04-16 MED ORDER — CHLORHEXIDINE GLUCONATE CLOTH 2 % EX PADS: 6.0000 | MEDICATED_PAD | Freq: Once | CUTANEOUS | Status: DC

## 2019-04-16 MED ORDER — CALCIUM CARBONATE 1250 (500 CA) MG PO TABS
500.0000 mg | ORAL_TABLET | Freq: Two times a day (BID) | ORAL | Status: DC
  Administered 2019-04-16: 500 mg via ORAL
  Filled 2019-04-16: qty 1

## 2019-04-16 MED ORDER — BUPIVACAINE HCL (PF) 0.25 % IJ SOLN
INTRAMUSCULAR | Status: AC
  Filled 2019-04-16: qty 30

## 2019-04-16 MED ORDER — HEPARIN 1000 UNIT/ML FOR PERITONEAL DIALYSIS
INTRAPERITONEAL | Status: DC | PRN
  Filled 2019-04-16: qty 5000

## 2019-04-16 MED ORDER — ACETAMINOPHEN 650 MG RE SUPP: 650.0000 mg | Freq: Four times a day (QID) | RECTAL | Status: DC | PRN

## 2019-04-16 MED ORDER — PANTOPRAZOLE SODIUM 40 MG PO TBEC
40.0000 mg | DELAYED_RELEASE_TABLET | Freq: Every day | ORAL | Status: DC
  Administered 2019-04-17 – 2019-04-18 (×2): 40 mg via ORAL
  Filled 2019-04-16 (×2): qty 1

## 2019-04-16 MED ORDER — CEFAZOLIN SODIUM-DEXTROSE 2-4 GM/100ML-% IV SOLN
2.0000 g | INTRAVENOUS | Status: AC
  Administered 2019-04-16: 2 g via INTRAVENOUS
  Filled 2019-04-16: qty 100

## 2019-04-16 MED ORDER — OXYCODONE HCL 5 MG/5ML PO SOLN: 5.0000 mg | Freq: Once | ORAL | Status: DC | PRN

## 2019-04-16 MED ORDER — HYDROMORPHONE HCL 1 MG/ML IJ SOLN
1.0000 mg | INTRAMUSCULAR | Status: DC | PRN
  Administered 2019-04-16: 1 mg via INTRAVENOUS
  Filled 2019-04-16: qty 1

## 2019-04-16 MED ORDER — FENTANYL CITRATE (PF) 100 MCG/2ML IJ SOLN: 25.0000 ug | INTRAMUSCULAR | Status: DC | PRN

## 2019-04-16 MED ORDER — SODIUM CHLORIDE 0.9 % IV SOLN: INTRAVENOUS | Status: DC

## 2019-04-16 MED ORDER — HYDROCODONE-ACETAMINOPHEN 5-325 MG PO TABS
1.0000 | ORAL_TABLET | ORAL | Status: DC | PRN
  Administered 2019-04-16 – 2019-04-17 (×4): 2 via ORAL
  Administered 2019-04-18: 1 via ORAL
  Filled 2019-04-16 (×2): qty 2
  Filled 2019-04-16: qty 1
  Filled 2019-04-16: qty 2

## 2019-04-16 MED ORDER — ONDANSETRON HCL 4 MG/2ML IJ SOLN
INTRAMUSCULAR | Status: AC
  Filled 2019-04-16: qty 2

## 2019-04-16 MED ORDER — ALLOPURINOL 100 MG PO TABS
100.0000 mg | ORAL_TABLET | Freq: Every day | ORAL | Status: DC
  Administered 2019-04-17 – 2019-04-18 (×2): 100 mg via ORAL
  Filled 2019-04-16 (×2): qty 1

## 2019-04-16 MED ORDER — OXYCODONE HCL 5 MG PO TABS: 5.0000 mg | ORAL_TABLET | Freq: Once | ORAL | Status: DC | PRN

## 2019-04-16 MED ORDER — ONDANSETRON HCL 4 MG/2ML IJ SOLN
INTRAMUSCULAR | Status: DC | PRN
  Administered 2019-04-16: 4 mg via INTRAVENOUS

## 2019-04-16 MED ORDER — AMLODIPINE BESYLATE 10 MG PO TABS
10.0000 mg | ORAL_TABLET | Freq: Every morning | ORAL | Status: DC
  Administered 2019-04-17 – 2019-04-18 (×2): 10 mg via ORAL
  Filled 2019-04-16 (×2): qty 1

## 2019-04-16 MED ORDER — DELFLEX-LC/1.5% DEXTROSE 344 MOSM/L IP SOLN: INTRAPERITONEAL | Status: DC

## 2019-04-16 MED ORDER — HEPARIN 1000 UNIT/ML FOR PERITONEAL DIALYSIS: 500.0000 [IU] | INTRAMUSCULAR | Status: DC | PRN

## 2019-04-16 MED ORDER — ONDANSETRON HCL 4 MG/2ML IJ SOLN: 4.0000 mg | Freq: Once | INTRAMUSCULAR | Status: DC | PRN

## 2019-04-16 MED ORDER — GENTAMICIN SULFATE 0.1 % EX CREA
1.0000 "application " | TOPICAL_CREAM | Freq: Every day | CUTANEOUS | Status: DC
  Administered 2019-04-18: 1 via TOPICAL
  Filled 2019-04-16: qty 15

## 2019-04-16 MED ORDER — EPHEDRINE SULFATE-NACL 50-0.9 MG/10ML-% IV SOSY
PREFILLED_SYRINGE | INTRAVENOUS | Status: DC | PRN
  Administered 2019-04-16: 10 mg via INTRAVENOUS

## 2019-04-16 MED ORDER — SUCCINYLCHOLINE CHLORIDE 200 MG/10ML IV SOSY
PREFILLED_SYRINGE | INTRAVENOUS | Status: DC | PRN
  Administered 2019-04-16: 140 mg via INTRAVENOUS

## 2019-04-16 MED ORDER — 0.9 % SODIUM CHLORIDE (POUR BTL) OPTIME
TOPICAL | Status: DC | PRN
  Administered 2019-04-16: 1000 mL

## 2019-04-16 MED ORDER — ACETAMINOPHEN 325 MG PO TABS: 650.0000 mg | ORAL_TABLET | Freq: Four times a day (QID) | ORAL | Status: DC | PRN

## 2019-04-16 MED ORDER — CEFAZOLIN SODIUM-DEXTROSE 2-4 GM/100ML-% IV SOLN
INTRAVENOUS | Status: AC
  Filled 2019-04-16: qty 100

## 2019-04-16 MED ORDER — ONDANSETRON HCL 4 MG/2ML IJ SOLN: 4.0000 mg | Freq: Four times a day (QID) | INTRAMUSCULAR | Status: DC | PRN

## 2019-04-16 MED ORDER — MOMETASONE FURO-FORMOTEROL FUM 200-5 MCG/ACT IN AERO
2.0000 | INHALATION_SPRAY | Freq: Two times a day (BID) | RESPIRATORY_TRACT | Status: DC
  Administered 2019-04-16 – 2019-04-18 (×4): 2 via RESPIRATORY_TRACT
  Filled 2019-04-16: qty 8.8

## 2019-04-16 MED ORDER — SODIUM CHLORIDE 0.9 % IV SOLN
INTRAVENOUS | Status: DC | PRN
  Administered 2019-04-16 (×2): via INTRAVENOUS

## 2019-04-16 SURGICAL SUPPLY — 67 items
APL PRP STRL LF DISP 70% ISPRP (MISCELLANEOUS) ×2
ATTRACTOMAT 16X20 MAGNETIC DRP (DRAPES) ×3 IMPLANT
BLADE CLIPPER SURG (BLADE) IMPLANT
BLADE SURG 10 STRL SS (BLADE) ×3 IMPLANT
BLADE SURG 15 STRL LF DISP TIS (BLADE) ×2 IMPLANT
BLADE SURG 15 STRL SS (BLADE) ×3
CANISTER SUCT 3000ML PPV (MISCELLANEOUS) ×3 IMPLANT
CHLORAPREP W/TINT 10.5 ML (MISCELLANEOUS) ×3 IMPLANT
CHLORAPREP W/TINT 26 (MISCELLANEOUS) ×3 IMPLANT
CLIP VESOCCLUDE MED 24/CT (CLIP) ×3 IMPLANT
CLIP VESOCCLUDE MED 6/CT (CLIP) ×3 IMPLANT
CLIP VESOCCLUDE SM WIDE 24/CT (CLIP) ×3 IMPLANT
CLIP VESOCCLUDE SM WIDE 6/CT (CLIP) ×4 IMPLANT
CONT SPEC 4OZ CLIKSEAL STRL BL (MISCELLANEOUS) ×3 IMPLANT
COVER SURGICAL LIGHT HANDLE (MISCELLANEOUS) ×3 IMPLANT
COVER WAND RF STERILE (DRAPES) ×3 IMPLANT
DRAPE LAPAROTOMY 100X72 PEDS (DRAPES) ×3 IMPLANT
DRAPE UTILITY XL STRL (DRAPES) ×3 IMPLANT
ELECT CAUTERY BLADE 6.4 (BLADE) ×3 IMPLANT
ELECT REM PT RETURN 9FT ADLT (ELECTROSURGICAL) ×3
ELECTRODE REM PT RTRN 9FT ADLT (ELECTROSURGICAL) ×2 IMPLANT
GAUZE 4X4 16PLY RFD (DISPOSABLE) ×3 IMPLANT
GAUZE SPONGE 2X2 8PLY STRL LF (GAUZE/BANDAGES/DRESSINGS) ×2 IMPLANT
GAUZE SPONGE 4X4 12PLY STRL (GAUZE/BANDAGES/DRESSINGS) ×3 IMPLANT
GLOVE BIO SURGEON STRL SZ7.5 (GLOVE) ×1 IMPLANT
GLOVE BIOGEL PI IND STRL 6.5 (GLOVE) IMPLANT
GLOVE BIOGEL PI IND STRL 7.5 (GLOVE) IMPLANT
GLOVE BIOGEL PI INDICATOR 6.5 (GLOVE) ×1
GLOVE BIOGEL PI INDICATOR 7.5 (GLOVE) ×1
GLOVE SS BIOGEL STRL SZ 6 (GLOVE) IMPLANT
GLOVE SS BIOGEL STRL SZ 6.5 (GLOVE) IMPLANT
GLOVE SUPERSENSE BIOGEL SZ 6 (GLOVE) ×1
GLOVE SUPERSENSE BIOGEL SZ 6.5 (GLOVE) ×1
GLOVE SURG ORTHO 8.0 STRL STRW (GLOVE) ×3 IMPLANT
GOWN STRL REUS W/ TWL LRG LVL3 (GOWN DISPOSABLE) ×2 IMPLANT
GOWN STRL REUS W/ TWL XL LVL3 (GOWN DISPOSABLE) ×2 IMPLANT
GOWN STRL REUS W/TWL LRG LVL3 (GOWN DISPOSABLE) ×3
GOWN STRL REUS W/TWL XL LVL3 (GOWN DISPOSABLE) ×3
HEMOSTAT ARISTA ABSORB 3G PWDR (HEMOSTASIS) IMPLANT
HEMOSTAT SURGICEL 2X4 FIBR (HEMOSTASIS) ×3 IMPLANT
ILLUMINATOR WAVEGUIDE N/F (MISCELLANEOUS) ×3 IMPLANT
KIT BASIN OR (CUSTOM PROCEDURE TRAY) ×3 IMPLANT
KIT TURNOVER KIT B (KITS) ×3 IMPLANT
NDL HYPO 25GX1X1/2 BEV (NEEDLE) ×2 IMPLANT
NEEDLE HYPO 25GX1X1/2 BEV (NEEDLE) ×3 IMPLANT
NS IRRIG 1000ML POUR BTL (IV SOLUTION) ×3 IMPLANT
PACK SURGICAL SETUP 50X90 (CUSTOM PROCEDURE TRAY) ×3 IMPLANT
PAD ARMBOARD 7.5X6 YLW CONV (MISCELLANEOUS) ×3 IMPLANT
PENCIL BUTTON HOLSTER BLD 10FT (ELECTRODE) ×3 IMPLANT
POSITIONER HEAD DONUT 9IN (MISCELLANEOUS) ×3 IMPLANT
SHEARS HARMONIC 9CM CVD (BLADE) ×3 IMPLANT
SPECIMEN JAR MEDIUM (MISCELLANEOUS) IMPLANT
SPONGE GAUZE 2X2 STER 10/PKG (GAUZE/BANDAGES/DRESSINGS) ×1
SPONGE INTESTINAL PEANUT (DISPOSABLE) ×3 IMPLANT
STRIP CLOSURE SKIN 1/2X4 (GAUZE/BANDAGES/DRESSINGS) ×3 IMPLANT
SUT MNCRL AB 4-0 PS2 18 (SUTURE) ×3 IMPLANT
SUT SILK 2 0 (SUTURE)
SUT SILK 2-0 18XBRD TIE 12 (SUTURE) ×2 IMPLANT
SUT SILK 3 0 (SUTURE)
SUT SILK 3-0 18XBRD TIE 12 (SUTURE) IMPLANT
SUT VIC AB 3-0 SH 18 (SUTURE) ×4 IMPLANT
SYR BULB 3OZ (MISCELLANEOUS) ×3 IMPLANT
SYR CONTROL 10ML LL (SYRINGE) ×3 IMPLANT
TAPE CLOTH SURG 4X10 WHT LF (GAUZE/BANDAGES/DRESSINGS) ×1 IMPLANT
TOWEL GREEN STERILE (TOWEL DISPOSABLE) ×3 IMPLANT
TOWEL GREEN STERILE FF (TOWEL DISPOSABLE) ×3 IMPLANT
TUBE CONNECTING 12X1/4 (SUCTIONS) ×3 IMPLANT

## 2019-04-16 NOTE — Anesthesia Procedure Notes (Signed)
Procedure Name: Intubation Date/Time: 04/16/2019 9:39 AM Performed by: Gwyndolyn Saxon, CRNA Pre-anesthesia Checklist: Patient identified, Emergency Drugs available, Suction available and Patient being monitored Patient Re-evaluated:Patient Re-evaluated prior to induction Oxygen Delivery Method: Circle system utilized Preoxygenation: Pre-oxygenation with 100% oxygen Induction Type: IV induction and Rapid sequence Laryngoscope Size: Miller and 3 Grade View: Grade I Tube type: Oral Tube size: 7.5 mm Number of attempts: 1 Airway Equipment and Method: Patient positioned with wedge pillow and Stylet Placement Confirmation: ETT inserted through vocal cords under direct vision,  positive ETCO2 and breath sounds checked- equal and bilateral Secured at: 23 cm Tube secured with: Tape Dental Injury: Teeth and Oropharynx as per pre-operative assessment

## 2019-04-16 NOTE — Consult Note (Signed)
Renal Service Consult Note [facility]  Phillip Blair 04/16/2019 Sol Blazing Requesting Physician:  Dr Harlow Asa  Reason for Consult:  ESRD s/o parathyroidectomy HPI: The patient is a 63 y.o. year-old with hx of OSA, renal cancer, HTN, HL, gout, CAD and ESRD on CCPD admitted for parathyroidectomy which was done today. We are asked to see for ESRD.    Pt had prior parathyroidectomy done by Dr Ninfa Linden w/ removal of 3 glands, the left superior gland was left in situ.  Nuclear med scan and [name] work-up recently showed 1.2 cm nodule in R thyroid lobe c/w parathyroid tissue. In OR today the R thyroid lobe appeared to have a dominant nodule in the inf pole and a smaller one lat/ post.  The R thyroid lobe was dissected and sent to pathology. The R carotid sheath area was investigated, and a 2.5 cm mass was found which had features of a large parathyoid adenoma.  The mass was dissected and sent off to pathology where frozen specimen was c/w parathyroid tissue.   Pt started PD in early 2019, he does the procedure himself.  Lives at home w/ his wife.  Has back up AVF but has never used it , in L arm.      ROS  denies CP  no joint pain   no HA  no blurry vision  no rash  no diarrhea  no nausea/ vomiting     Past Medical History  Past Medical History:  Diagnosis Date  . Allergy   . Anemia    low iron  . Chronic kidney disease    denies  . Coronary artery disease    02/06/19: Mild non-obstructive disease on left, 100% mid RCA with left-to-right collaterals. No PCI. [facility])  . ESRD (end stage renal disease) ([place])    s/p LUE basilic vein transpositiono 2017; s/p peritoneal dialysis catheter 12/31/17  . Family history of kidney stone   . GERD (gastroesophageal reflux disease)    occ  . Gout   . Hypercalcemia   . Hyperlipidemia   . Hypertension    does not see a cardiologist  . Parathyroid adenoma   . Renal cancer ([place])    kidney  . Sleep apnea    no longer uses  cpap, has lost weight   Past Surgical History  Past Surgical History:  Procedure Laterality Date  . AV FISTULA PLACEMENT Left 03/30/2016   Procedure: FIRST STAGE BASILIC VEIN TRANSPOSITION LEFT UPPER ARM;  Surgeon: Serafina Mitchell, MD;  Location: [place];  Service: Vascular;  Laterality: Left;  . BASCILIC VEIN TRANSPOSITION Left 06/13/2016   Procedure: SECOND STAGE BASILIC VEIN TRANSPOSITION;  Surgeon: Serafina Mitchell, MD;  Location: [place];  Service: Vascular;  Laterality: Left;  . COLONOSCOPY    . ESOPHAGOGASTRODUODENOSCOPY (EGD) WITH PROPOFOL N/A 01/03/2018   Procedure: ESOPHAGOGASTRODUODENOSCOPY (EGD) WITH PROPOFOL;  Surgeon: Jackquline Denmark, MD;  Location: [facility];  Service: Endoscopy;  Laterality: N/A;  . MINIMALLY INVASIVE RADIOACTIVE PARATHYROIDECTOMY N/A 05/04/2013   Procedure: PARATHYROIDECTOMY MINIMALLY INVASIVE;  Surgeon: Harl Bowie, MD;  Location: [place];  Service: General;  Laterality: N/A;  . MINIMALLY INVASIVE RADIOACTIVE PARATHYROIDECTOMY N/A 10/20/2013   Procedure: MINIMALLY INVASIVE PARATHYROIDECTOMY CONVERTED TO COMPLETE NECK EXPLORATION;  Surgeon: Harl Bowie, MD;  Location: [place];  Service: General;  Laterality: N/A;  . NASAL SEPTOPLASTY W/ TURBINOPLASTY    . PARATHYROIDECTOMY  04/16/2019  . ROBOT ASSISTED LAPAROSCOPIC NEPHRECTOMY Left 01/19/2015   Procedure: ROBOTIC ASSISTED LAPAROSCOPIC  RADICAL NEPHRECTOMY AND INTRAOPERATIVE ULTRASOUND ;  Surgeon: Alexis Frock, MD;  Location: [facility];  Service: Urology;  Laterality: Left;  [facility] VASECTOMY     Family History  Family History  Problem Relation Age of Onset  . Colon cancer Neg Hx   . Rectal cancer Neg Hx   . Stomach cancer Neg Hx   . Liver cancer Neg Hx   . Esophageal cancer Neg Hx    Social History  reports that he has never smoked. He has never used smokeless tobacco. He reports current alcohol use of about 2.0 standard drinks of alcohol per week. He reports that he does not use drugs. Allergies  Allergies   Allergen Reactions  . Ivp Dye [Iodinated Diagnostic Agents] Itching and Other (See Comments)    SKIN PEELS  . Iodine-131 Itching and Rash   Home medications Prior to Admission medications   Medication Sig Start Date End Date Taking? Authorizing Provider  allopurinol (ZYLOPRIM) 100 MG tablet Take 100 mg by mouth daily. 02/22/16  Yes [provider]  amLODipine (NORVASC) 10 MG tablet Take 10 mg by mouth every morning.    Yes [provider]  b complex-vitamin c-folic acid (NEPHRO-VITE) 0.8 MG TABS tablet Take 1 tablet by mouth daily. 05/30/18  Yes [provider]  ferric citrate (AURYXIA) 1 GM 210 MG(Fe) tablet Take 210 mg by mouth 3 (three) times daily with meals.   Yes [provider]  Fluticasone-Salmeterol (WIXELA INHUB) 250-50 MCG/DOSE AEPB Inhale 1 puff into the lungs 2 (two) times daily.   Yes [provider]  ipratropium (ATROVENT) 0.06 % nasal spray Place 2 sprays into both nostrils 4 (four) times daily.  12/25/17  Yes [provider]  lanthanum (FOSRENOL) 1000 MG chewable tablet Chew 1,000 mg by mouth 3 (three) times daily with meals.    Yes [provider]  pantoprazole (PROTONIX) 40 MG tablet Take 40 mg by mouth daily.   Yes [provider]  rosuvastatin (CRESTOR) 5 MG tablet Take 5 mg by mouth every Monday, Wednesday, and Friday.   Yes [provider]  sodium bicarbonate 325 MG tablet Take 650 mg by mouth 2 (two) times daily.   Yes [provider]   Liver Function Tests No results for input(s): AST, ALT, ALKPHOS, BILITOT, PROT, ALBUMIN in the last 168 hours. No results for input(s): LIPASE, AMYLASE in the last 168 hours. CBC Recent Labs  Lab 04/15/19 1140 04/16/19 0809  WBC 9.3  --   HGB 10.6* 10.9*  HCT 33.7* 32.0*  MCV 97.7  --   PLT 270  --    Basic Metabolic Panel Recent Labs  Lab 04/15/19 1140 04/16/19 0809  NA 142 137  K 4.0 4.2  CL 101  --   CO2 25  --   GLUCOSE 130* 107*   BUN 72*  --   CREATININE 11.75*  --   CALCIUM 10.8*  --    Iron/TIBC/Ferritin/ %Sat    Component Value Date/Time   IRON 100 01/02/2018 0939   TIBC 300 01/02/2018 0939   FERRITIN 192 01/02/2018 0939   IRONPCTSAT 33 01/02/2018 0939    Vitals:   04/16/19 1210 04/16/19 1215 04/16/19 1230 04/16/19 1251  BP: 125/68 128/79 131/76 130/78  Pulse: 85 85 85 86  Resp: 20 20 17 18   Temp:  98 F (36.7 C)  98.4 F (36.9 C)  TempSrc:    Oral  SpO2: 94% 98% 100% 98%  Weight:  Height:        Exam Gen alert, obese, pleasant man No rash, cyanosis or gangrene Sclera anicteric, throat clear   No jvd or bruits, dressing ant neck Chest clear bilat RRR no MRG Abd soft ntnd no mass or ascites +bs, LLQ PD cath clean dressing GU normal male MS no joint effusions or deformity Ext no LE edema, no wounds or ulcers  Neuro is alert, Ox 3 , nf    Home meds:  - amlodipine 10 qd  - sod bicarb bid/ pantoprazole 40 qd/ lanthanum 1gm tid/ ferr citrate ac tid/ allopurinol 100 qd  - fluticasone - salmeterol 25-50 bid  - prn's/ vitamins/ supplements     PD orders (Dr [name]): 5 exchanges overnight, no pause or day bag, all 1.5% pt requests tonight, dwell 1h 81min approx, volume 2000 cc    Assessment/ Plan: 1. SP parathroidectomy: hx of prior surgeries for the same. Watch closely for hypocalcemia, bid renal panels ordered.  Ca normal today.  2. ESRD on PD: vol is normla on exam, orders as above, started 2019 3. Hx renal cancer 4. HTN 5. H/o CAD 6. Anemia ckd- Hb 10.9, no need esa   Kelly Splinter  MD 04/16/2019, 2:30 PM

## 2019-04-16 NOTE — Transfer of Care (Signed)
Immediate Anesthesia Transfer of Care Note  Patient: Phillip Blair  Procedure(s) Performed: NECK EXPLORATION AND PARATHYROIDECTOMY (N/A Neck) RIGHT THYROID LOBECTOMY (Right Neck)  Patient Location: PACU  Anesthesia Type:General  Level of Consciousness: drowsy  Airway & Oxygen Therapy: Patient Spontanous Breathing and Patient connected to face mask oxygen  Post-op Assessment: Report given to RN and Post -op Vital signs reviewed and stable  Post vital signs: Reviewed and stable  Last Vitals:  Vitals Value Taken Time  BP 121/78 04/16/19 1155  Temp    Pulse    Resp 22 04/16/19 1155  SpO2      Last Pain:  Vitals:   04/16/19 0750  TempSrc: Oral  PainSc: 0-No pain         Complications: No apparent anesthesia complications

## 2019-04-16 NOTE — Op Note (Signed)
Operative Note  Pre-operative Diagnosis: Secondary hyperparathyroidism, end-stage renal disease  Post-operative Diagnosis: Same  Surgeon:  Armandina Gemma, MD  Assistant:  Sharyn Dross, RNFA   Procedure: Neck exploration, right thyroid lobectomy, excision of parathyroid adenoma from right carotid sheath  Anesthesia: General  Estimated Blood Loss: 50 cc  Drains: None         Specimen: Right thyroid lobe and right parathyroid to pathology for review  Indications:  Patient has end-stage renal disease and is currently on peritoneal dialysis. Phillip Blair was evaluated in January and scheduled for surgery but due to the virus pandemic his procedure was postponed. Patient now returns to discuss surgery. Phillip Blair has had 2 prior operations by my partner, Dr. Nedra Hai. Phillip Blair has had removal of 3 parathyroid glands. A normal left superior gland was left in situ. Diagnostic studies including a nuclear medicine parathyroid scan and an ultrasound examination demonstrated a 12 mm nodule in the lateral right thyroid lobe consistent with hypercellular parathyroid tissue. Patient returns to discuss surgery for removal of this parathyroid tissue in hopes of correcting his secondary hyperparathyroidism.  Procedure Details:  The patient was seen in the pre-op holding area. The risks, benefits, complications, treatment options, and expected outcomes were previously discussed with the patient. The patient agreed with the proposed plan and has signed the informed consent form.  The patient was brought to the operating room by the surgical team, identified as Phillip Blair and the procedure verified. A "time out" was completed and the above information confirmed.  Patient was positioned on the operating room table following induction of general anesthesia.  After ascertaining that an adequate level of anesthesia been achieved, patient was prepped and draped in the usual aseptic fashion.  Previous cervical incision is  reopened with a #15 blade and dissection carried through subcutaneous tissues and scar tissue down to the musculature.  Subplatysmal flaps are developed cephalad and caudad from the thyroid notch to the sternal notch.  A [place] self-retaining retractors placed for exposure.  Strap muscles are incised in the midline and reflected to the right.  There are dense adhesions to the right thyroid lobe.  Right thyroid lobe appears to contain a dominant nodule in the inferior pole and a smaller nodule located laterally and posteriorly.  There is significant scar tissue surrounding the gland from prior surgery.  With some difficulty, the gland is dissected out.  Vascular structures are divided between small and medium ligaclips.  Superior pole vessels are taken down and divided between ligaclips with the harmonic scalpel.  Gland is rolled anteriorly.  Small vessels are divided between ligaclips with the harmonic scalpel.  Gland is rolled anteriorly up and onto the trachea from which it is excised using the electrocautery.  Isthmus is mobilized to the midline and then divided.  The right thyroid lobe is sectioned on the table.  There is an indistinct mass in the inferior pole.  There is a smaller nodule laterally which on sectioning contains gelatinous material consistent with a colloid nodule.  The entire right lobe is submitted fresh to pathology for review.  I discussed the case with the pathologist by telephone at this time.  Further dissection in the right neck is carried out down to the precervical fascia.  There is no sign of enlarged parathyroid tissue.  Next the right carotid sheath is opened.  Carotid artery appears normal.  Sheath is open for several centimeters.  Palpation lateral to the carotid artery shows a nodular mass.  This is  then dissected out.  At this point we identified a 2-1/2 cm gray-colored mass consistent with a large parathyroid adenoma.  It is situated lateral and posterior to the carotid  artery against the jugular vein.  Vascular structures are divided between ligaclips with the harmonic scalpel.  Mass is excised off of the jugular vein in its entirety and then submitted to pathology for frozen section biopsy.  Pathology confirms hypercellular parathyroid tissue consistent with parathyroid adenoma.  Good hemostasis is achieved throughout the neck.  Neck is irrigated with warm saline.  Fibrillar is placed throughout the operative field.  Strap muscles are reapproximated in midline of interrupted 3-0 Vicryl sutures.  Platysma was closed with interrupted 3-0 Vicryl sutures.  Skin is anesthetized with local anesthetic and then closed with a running 4-0 Monocryl subcuticular suture.  Wound is washed and dried and Dermabond is applied.  Patient is awakened from anesthesia and taken to the recovery room in stable condition.  The patient tolerated the procedure well.   Armandina Gemma, MD [facility] Office: [phone]

## 2019-04-16 NOTE — OR Nursing (Signed)
Dr. Harlow Asa spoke with pathologist @ 1100 about requests for test to be run on thyroid lobe.  He does not need a frozen at this time.

## 2019-04-16 NOTE — Interval H&P Note (Signed)
History and Physical Interval Note:  04/16/2019 8:49 AM  Phillip Blair  has presented today for surgery, with the diagnosis of SECONDARY HYPERPARATHYROIDISM.  The various methods of treatment have been discussed with the patient and family. After consideration of risks, benefits and other options for treatment, the patient has consented to    Procedure(s): NECK EXPLORATION AND PARATHYROIDECTOMY (N/A) POSSIBLE RIGHT THYROID LOBECTOMY (Right) as a surgical intervention.    The patient's history has been reviewed, patient examined, no change in status, stable for surgery.  I have reviewed the patient's chart and labs.  Questions were answered to the patient's satisfaction.    Armandina Gemma, [facility] Office: [place]

## 2019-04-16 NOTE — Anesthesia Postprocedure Evaluation (Signed)
Anesthesia Post Note  Patient: Phillip Blair  Procedure(s) Performed: NECK EXPLORATION AND PARATHYROIDECTOMY (N/A Neck) RIGHT THYROID LOBECTOMY (Right Neck)     Patient location during evaluation: PACU Anesthesia Type: General Level of consciousness: awake and alert Pain management: pain level controlled Vital Signs Assessment: post-procedure vital signs reviewed and stable Respiratory status: spontaneous breathing, nonlabored ventilation, respiratory function stable and patient connected to nasal cannula oxygen Cardiovascular status: blood pressure returned to baseline and stable Postop Assessment: no apparent nausea or vomiting Anesthetic complications: no    Last Vitals:  Vitals:   04/16/19 1251 04/16/19 1456  BP: 130/78 128/76  Pulse: 86 90  Resp: 18 13  Temp: 36.9 C 36.5 C  SpO2: 98% 95%    Last Pain:  Vitals:   04/16/19 1535  TempSrc:   PainSc: 8                  Aundria Bitterman COKER

## 2019-04-17 ENCOUNTER — Encounter (HOSPITAL_COMMUNITY): Payer: Self-pay | Admitting: Surgery

## 2019-04-17 LAB — RENAL FUNCTION PANEL
Albumin: 3 g/dL — ABNORMAL LOW (ref 3.5–5.0)
Albumin: 3.4 g/dL — ABNORMAL LOW (ref 3.5–5.0)
Anion gap: 16 — ABNORMAL HIGH (ref 5–15)
Anion gap: 16 — ABNORMAL HIGH (ref 5–15)
BUN: 76 mg/dL — ABNORMAL HIGH (ref 8–23)
BUN: 80 mg/dL — ABNORMAL HIGH (ref 8–23)
CO2: 22 mmol/L (ref 22–32)
CO2: 24 mmol/L (ref 22–32)
Calcium: 8.7 mg/dL — ABNORMAL LOW (ref 8.9–10.3)
Calcium: 8.5 mg/dL — ABNORMAL LOW (ref 8.9–10.3)
Chloride: 99 mmol/L (ref 98–111)
Chloride: 97 mmol/L — ABNORMAL LOW (ref 98–111)
Creatinine, Ser: 10.89 mg/dL — ABNORMAL HIGH (ref 0.61–1.24)
Creatinine, Ser: 11.05 mg/dL — ABNORMAL HIGH (ref 0.61–1.24)
GFR calc Af Amer: 5 mL/min — ABNORMAL LOW (ref >60)
GFR calc Af Amer: 5 mL/min — ABNORMAL LOW (ref >60)
GFR calc non Af Amer: 4 mL/min — ABNORMAL LOW (ref >60)
GFR calc non Af Amer: 4 mL/min — ABNORMAL LOW (ref >60)
Glucose, Bld: 144 mg/dL — ABNORMAL HIGH (ref 70–99)
Glucose, Bld: 150 mg/dL — ABNORMAL HIGH (ref 70–99)
Phosphorus: 5.8 mg/dL — ABNORMAL HIGH (ref 2.5–4.6)
Phosphorus: 5.8 mg/dL — ABNORMAL HIGH (ref 2.5–4.6)
Potassium: 4.3 mmol/L (ref 3.5–5.1)
Potassium: 3.6 mmol/L (ref 3.5–5.1)
Sodium: 137 mmol/L (ref 135–145)
Sodium: 137 mmol/L (ref 135–145)

## 2019-04-17 MED ORDER — CALCIUM CARBONATE 1250 (500 CA) MG PO TABS
1000.0000 mg | ORAL_TABLET | Freq: Two times a day (BID) | ORAL | Status: DC
  Administered 2019-04-17: 1000 mg via ORAL
  Filled 2019-04-17: qty 1

## 2019-04-17 MED ORDER — PRO-STAT SUGAR FREE PO LIQD
30.0000 mL | Freq: Two times a day (BID) | ORAL | Status: DC
  Administered 2019-04-17 – 2019-04-18 (×3): 30 mL via ORAL
  Filled 2019-04-17 (×3): qty 30

## 2019-04-17 MED ORDER — CALCIUM CARBONATE 1250 (500 CA) MG PO TABS
3.0000 | ORAL_TABLET | Freq: Three times a day (TID) | ORAL | Status: DC
  Administered 2019-04-17 – 2019-04-18 (×5): 1500 mg via ORAL
  Filled 2019-04-17 (×6): qty 2

## 2019-04-17 MED ORDER — CALCIUM CARBONATE 1250 (500 CA) MG PO TABS: 1000.0000 mg | ORAL_TABLET | Freq: Two times a day (BID) | ORAL | Status: DC

## 2019-04-17 NOTE — Progress Notes (Addendum)
Phillip Blair Progress Note   Subjective:  Seen in room. No CP, dyspnea, or neck pain. CCPD just finishing up - went ok, but he is concerned that still has some fluid in abdomen, he tried manual drain. D/w RN who will try to reposition him and manually drain again when she disconnects him. Ca this morning 8.7.   Objective Vitals:   04/16/19 2145 04/17/19 0026 04/17/19 0458 04/17/19 0810  BP:  118/73 109/76   Pulse: 79 96 84   Resp: 18 18 18    Temp:  97.9 F (36.6 C) 98.2 F (36.8 C)   TempSrc:  Oral Oral   SpO2: 94% 97% 96% 94%  Weight:      Height:       Physical Exam General: Well appearing man, NAD. Bandage to anterior throat. Heart: RRR; no murmur Lungs: CTA Abdomen: soft, non-tender, ?distended Extremities: No LE edema Dialysis Access: PD cath in L abdomen.  Additional Objective Labs: Basic Metabolic Panel: Recent Labs  Lab 04/15/19 1140 04/16/19 0809 04/17/19 0446  NA 142 137 137  K 4.0 4.2 4.3  CL 101  --  99  CO2 25  --  22  GLUCOSE 130* 107* 144*  BUN 72*  --  76*  CREATININE 11.75*  --  10.89*  CALCIUM 10.8*  --  8.7*  PHOS  --   --  5.8*   Liver Function Tests: Recent Labs  Lab 04/17/19 0446  ALBUMIN 3.0*   CBC: Recent Labs  Lab 04/15/19 1140 04/16/19 0809  WBC 9.3  --   HGB 10.6* 10.9*  HCT 33.7* 32.0*  MCV 97.7  --   PLT 270  --    Studies/Results: Dg Chest 2 View  Result Date: 04/15/2019 CLINICAL DATA:  Preop for parathyroidectomy. History of hypertension. EXAM: CHEST - 2 VIEW COMPARISON:  04/07/2019 FINDINGS: Cardiac silhouette is normal in size and configuration. No mediastinal or hilar masses. No evidence of adenopathy. Clear lungs.  No pleural effusion or pneumothorax. Skeletal structures are intact. IMPRESSION: No active cardiopulmonary disease. Electronically Signed   By: Lajean Manes M.D.   On: 04/15/2019 17:27   Medications: . sodium chloride    . dialysis solution 1.5% low-MG/low-CA     . allopurinol  100 mg  Oral Daily  . amLODipine  10 mg Oral q morning - 10a  . calcitRIOL  1 mcg Oral Daily  . calcium carbonate  1,000 mg of elemental calcium Oral BID WC  . gentamicin cream  1 application Topical Daily  . ipratropium  2 spray Each Nare QID  . mometasone-formoterol  2 puff Inhalation BID  . pantoprazole  40 mg Oral Daily  . sodium bicarbonate  650 mg Oral BID   PD orders (Dr [name]): 5 exchanges overnight, no pause or day bag, all 1.5% pt requests tonight, dwell 1h 71min approx, volume 2000 cc  Assessment/Plan: 1. Secondary hyperparathyroidism: S/p repeat parathyroidectomy 04/16/19 (prev surgeries 2014, 2015). Ca dropped 10.8 -> 8.7 (expected). Started calcitriol 84mcg QD and Tums BID yesterday - increase Tums to 3/meals and QHS (as binder and Ca supp). Started 1 ug calcitriol qd as well.  Repeat Ca this afternoon and tomorrow morning. 2. ESRD on PD: CCPD nightly - keeping same orders, 1.5% fluid tonight since appetite still low. 3. HTN/volume: BP controlled, euvolemic for now. 4. Anemia: Hgb 10.9 - monitor. 5. CAD 6. Hx RCC 7. Nutrition: Add protein supplements since appetite low.   Veneta Penton, PA-C 04/17/2019, 11:33 AM  [place]  [facility] Pager: [phone]  Pt seen, examined and agree w assess/plan as above with additions as indicated.  [facility] 04/17/2019, 4:14 PM

## 2019-04-17 NOTE — Progress Notes (Signed)
Assessment & Plan: POD#1 - status post neck exploration, right thyroid lobectomy, excision of ectopic parathyroid gland from right carotid sheath  Advanced to regular renal diet  Pain controlled with hydrocodone tab  Peritoneal dialysis  Appreciate nephrology consultation and management  Anticipate discharge home tomorrow        Armandina Gemma, MD       [facility]       Office: [phone]   Chief Complaint: Hyperparathyroidism  Subjective: Patient up in room, ambulating, no complaints.  Diet advanced.  Peritoneal dialysis this evening.  Objective: Vital signs in last 24 hours: Temp:  [97.9 F (36.6 C)-98.4 F (36.9 C)] 97.9 F (36.6 C) (07/24 1513) Pulse Rate:  [79-97] 79 (07/24 1513) Resp:  [17-18] 18 (07/24 1513) BP: (109-127)/(64-81) 122/76 (07/24 1513) SpO2:  [94 %-98 %] 98 % (07/24 1513) Weight:  [103.3 kg] 103.3 kg (07/24 1600)    Intake/Output from previous day: 07/23 0701 - 07/24 0700 In: 10569 [I.V.:600] Out: 10490 [Urine:275; Blood:50] Intake/Output this shift: Total I/O In: -  Out: 100 [Urine:100]  Physical Exam: HEENT - sclerae clear, mucous membranes moist Neck - wound dry and intact; Steristrips in place; mild STS; voice good Chest - clear bilaterally Cor - RRR Ext - no edema, non-tender Neuro - alert & oriented, no focal deficits  Lab Results:  Recent Labs    04/15/19 1140 04/16/19 0809  WBC 9.3  --   HGB 10.6* 10.9*  HCT 33.7* 32.0*  PLT 270  --    BMET Recent Labs    04/15/19 1140 04/16/19 0809 04/17/19 0446  NA 142 137 137  K 4.0 4.2 4.3  CL 101  --  99  CO2 25  --  22  GLUCOSE 130* 107* 144*  BUN 72*  --  76*  CREATININE 11.75*  --  10.89*  CALCIUM 10.8*  --  8.7*   PT/INR No results for input(s): LABPROT, INR in the last 72 hours. Comprehensive Metabolic Panel:    Component Value Date/Time   NA 137 04/17/2019 0446   NA 137 04/16/2019 0809   NA 141 01/01/2014 1018   NA 142 08/28/2013 0954    K 4.3 04/17/2019 0446   K 4.2 04/16/2019 0809   CL 99 04/17/2019 0446   CL 101 04/15/2019 1140   CO2 22 04/17/2019 0446   CO2 25 04/15/2019 1140   BUN 76 (H) 04/17/2019 0446   BUN 72 (H) 04/15/2019 1140   BUN 25 (H) 01/01/2014 1018   BUN 32 (H) 08/28/2013 0954   CREATININE 10.89 (H) 04/17/2019 0446   CREATININE 11.75 (H) 04/15/2019 1140   CREATININE 1.58 (H) 05/18/2013 0938   CREATININE 1.65 (H) 01/14/2013 1410   GLUCOSE 144 (H) 04/17/2019 0446   GLUCOSE 107 (H) 04/16/2019 0809   CALCIUM 8.7 (L) 04/17/2019 0446   CALCIUM 10.8 (H) 04/15/2019 1140   CALCIUM 9.1 01/02/2018 0939   CALCIUM 10.2 10/09/2017 0922   AST 11 (L) 01/02/2018 1128   AST 10 (L) 10/09/2017 0922   ALT 10 (L) 01/02/2018 1128   ALT 12 (L) 10/09/2017 0922   ALKPHOS 222 (H) 01/02/2018 1128   ALKPHOS 261 (H) 10/09/2017 0922   BILITOT 0.3 01/02/2018 1128   BILITOT 0.5 10/09/2017 0922   PROT 6.1 (L) 01/02/2018 1128   PROT 7.1 10/09/2017 0922   PROT 6.5 01/01/2014 1018   PROT 6.6 08/28/2013 0954   ALBUMIN 3.0 (L) 04/17/2019 0446   ALBUMIN 3.4 (L) 01/02/2018  1128   ALBUMIN 4.3 01/01/2014 1018   ALBUMIN 4.5 08/28/2013 0954    Studies/Results: No results found.    Armandina Gemma 04/17/2019  Patient ID: Harold Barban, male   DOB: [birth year], 63 y.o.   MRN: [id number]

## 2019-04-17 NOTE — Plan of Care (Signed)
  Problem: Pain Managment: Goal: General experience of comfort will improve Outcome: Progressing   Problem: Safety: Goal: Ability to remain free from injury will improve Outcome: Progressing   Problem: Skin Integrity: Goal: Risk for impaired skin integrity will decrease Outcome: Progressing   

## 2019-04-18 LAB — RENAL FUNCTION PANEL
Albumin: 3.1 g/dL — ABNORMAL LOW (ref 3.5–5.0)
Anion gap: 16 — ABNORMAL HIGH (ref 5–15)
BUN: 83 mg/dL — ABNORMAL HIGH (ref 8–23)
CO2: 23 mmol/L (ref 22–32)
Calcium: 9 mg/dL (ref 8.9–10.3)
Chloride: 97 mmol/L — ABNORMAL LOW (ref 98–111)
Creatinine, Ser: 11.11 mg/dL — ABNORMAL HIGH (ref 0.61–1.24)
GFR calc Af Amer: 5 mL/min — ABNORMAL LOW (ref >60)
GFR calc non Af Amer: 4 mL/min — ABNORMAL LOW (ref >60)
Glucose, Bld: 164 mg/dL — ABNORMAL HIGH (ref 70–99)
Phosphorus: 5.1 mg/dL — ABNORMAL HIGH (ref 2.5–4.6)
Potassium: 3.8 mmol/L (ref 3.5–5.1)
Sodium: 136 mmol/L (ref 135–145)

## 2019-04-18 MED ORDER — HYDROCODONE-ACETAMINOPHEN 5-325 MG PO TABS: 1.0000 | ORAL_TABLET | ORAL | 0 refills | Status: DC | PRN

## 2019-04-18 MED ORDER — CALCIUM CARBONATE 1250 (500 CA) MG PO TABS: 1.0000 | ORAL_TABLET | Freq: Three times a day (TID) | ORAL | 0 refills | Status: DC

## 2019-04-18 MED ORDER — CALCITRIOL 0.5 MCG PO CAPS: 1.0000 ug | ORAL_CAPSULE | Freq: Every day | ORAL | 0 refills | Status: DC

## 2019-04-18 NOTE — Discharge Summary (Addendum)
Physician Discharge Summary  Patient ID: Phillip Blair MRN: [id number] DOB/AGE: [id number] 63 y.o.  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 Harlow Asa.  Observed two nights.  Incision healing ok and ready to go home  Consults: renal  Significant Diagnostic Studies: path pending    Discharge Exam: Blood pressure 117/80, pulse 80, temperature 98.2 F (36.8 C), temperature source Oral, resp. rate 16, height 6' (1.829 m), weight 100 kg, SpO2 96 %. Incisions covered with steristrips  Disposition:    Allergies as of 04/18/2019      Reactions   Ivp Dye [iodinated Diagnostic Agents] Itching, Other (See Comments)   SKIN PEELS   Iodine-131 Itching, Rash      Medication List    TAKE these medications   allopurinol 100 MG tablet Commonly known as: ZYLOPRIM Take 100 mg by mouth daily.   amLODipine 10 MG tablet Commonly known as: NORVASC Take 10 mg by mouth every morning.   Auryxia 1 GM 210 MG(Fe) tablet Generic drug: ferric citrate Take 210 mg by mouth 3 (three) times daily with meals.   b complex-vitamin c-folic acid 0.8 MG Tabs tablet Take 1 tablet by mouth daily.   calcitRIOL 0.5 MCG capsule Commonly known as: ROCALTROL Take 2 capsules (1 mcg total) by mouth daily.   calcium carbonate 1250 (500 Ca) MG tablet Commonly known as: OS-CAL - dosed in mg of elemental calcium Take 1 tablet (500 mg of elemental calcium total) by mouth 4 (four) times daily - after meals and at bedtime.   HYDROcodone-acetaminophen 5-325 MG tablet Commonly known as:  NORCO/VICODIN Take 1-2 tablets by mouth every 4 (four) hours as needed for moderate pain.   ipratropium 0.06 % nasal spray Commonly known as: ATROVENT Place 2 sprays into both nostrils 4 (four) times daily.   lanthanum 1000 MG chewable tablet Commonly known as: FOSRENOL Chew 1,000 mg by mouth 3 (three) times daily with meals.   pantoprazole 40 MG tablet Commonly known as: PROTONIX Take 40 mg by mouth daily.   rosuvastatin 5 MG tablet Commonly known as: CRESTOR Take 5 mg by mouth every Monday, Wednesday, and Friday.   sodium bicarbonate 325 MG tablet Take 650 mg by mouth 2 (two) times daily.   Wixela Inhub 250-50 MCG/DOSE Aepb Generic drug: Fluticasone-Salmeterol Inhale 1 puff into the lungs 2 (two) times daily.        Signed: Pedro Earls 04/18/2019, 8:48 AM

## 2019-04-18 NOTE — Discharge Instructions (Signed)
Parathyroidectomy  A parathyroidectomy is a surgery to remove one or more parathyroid glands. These glands are in the neck. Each gland is very small, about the size of a pea. Most people have four parathyroid glands. The glands produce parathyroid hormone, which helps to control the level of calcium in the body. You may have a parathyroidectomy if your body produces too much parathyroid hormone (hyperparathyroidism). This usually occurs when one or more of your parathyroid glands becomes enlarged from a type of noncancerous tumor (adenoma). Tell a health care provider about:  Any allergies you have.  All medicines you are taking, including vitamins, herbs, eye drops, creams, and over-the-counter medicines.  Any problems you or family members have had with anesthetic medicines.  Any blood disorders you have.  Any surgeries you have had.  Any medical conditions you have.  Whether you are pregnant or may be pregnant. What are the risks? Generally, this is a safe procedure. However, problems may occur, including:  Bleeding.  Infection.  Allergic reactions to medicines.  Damage to the nerves of your voice box (larynx). This can be temporary or long-term (rare).  Damage to nearby structures and organs, such as the skin (scarring), surrounding blood vessels, and nerves in the neck.  Hoarseness. This usually resolves in 24-48 hours.  A condition in which your body does not make enough parathyroid hormone (hypoparathyroidism). This is rare.  Difficulty breathing. This is rare. What happens before the procedure? Staying hydrated Follow instructions from your health care provider about hydration, which may include:  Up to 2 hours before the procedure - you may continue to drink clear liquids, such as water, clear fruit juice, black coffee, and plain tea. Eating and drinking restrictions Follow instructions from your health care provider about eating and drinking, which may  include:  8 hours before the procedure - stop eating heavy meals or foods such as meat, fried foods, or fatty foods.  6 hours before the procedure - stop eating light meals or foods, such as toast or cereal.  6 hours before the procedure - stop drinking milk or drinks that contain milk.  2 hours before the procedure - stop drinking clear liquids. Medicines Ask your health care provider about:  Changing or stopping your regular medicines. This is especially important if you are taking diabetes medicines or blood thinners.  Taking medicines such as aspirin and ibuprofen. These medicines can thin your blood. Do not take these medicines unless your health care provider tells you to take them.  Taking over-the-counter medicines, vitamins, herbs, and supplements. General instructions  You may be asked to shower with a germ-killing soap.  Plan to have someone take you home from the hospital or clinic.  Plan to have a responsible adult care for you for at least 24 hours after you leave the hospital or clinic. This is important. What happens during the procedure?  To lower your risk of infection: ? Your health care team will wash or sanitize their hands. ? Hair may be removed from the surgical area. ? Your skin will be washed with soap.  An IV will be inserted into one of your veins.  You will be given one or more of the following: ? A medicine to help you relax (sedative). ? A medicine to make you fall asleep (general anesthetic).  An incision will be made according to the type of parathyroidectomy procedure you are having. There are four methods that may be used: ? Open surgery. A single incision will  be made in the center of your neck. The incision will be about 2-4 inches long. ? Minimally invasive surgery. A small incision will be made in the side of your neck. This incision will be about 1-2 inches long. Before the procedure, you might be given an injection of a type of medicine  that will help the surgeon to locate the gland. ? Video-assisted surgery. Two small incisions will be made in your neck. One incision is for the instruments that will be used to remove the gland. The other incision is for a tiny camera that will help the surgeon to see inside your neck. ? Endoscopic surgery. An incision will be made just above your collarbone. A small, flexible tube (endoscope) will be inserted through this incision.  Your health care provider may monitor laryngeal nerve function during the procedure for safety reasons.  The gland or glands that are causing problems will be removed.  The incisions will be closed using stitches (sutures) or other methods. The sutures will often be hidden under the skin. The procedure may vary among health care providers and hospitals. What happens after the procedure?  Your blood pressure, heart rate, breathing rate, and blood oxygen level will be monitored until the medicines you were given have worn off.  You will be given pain medicine as needed.  Your provider will check your ability to talk and swallow after the procedure.  You will gradually start to drink liquids and have soft foods as tolerated.  Your blood will be tested to check the calcium level in your body.  Do not drive for 24 hours if you were given a sedative during your procedure. Summary  The parathyroid glands are located in the neck and produce parathyroid hormone, which helps to control the level of calcium in the body.  A parathyroidectomy is a surgery to remove one or more parathyroid glands.  You may have a parathyroidectomy if your body produces too much parathyroid hormone (hyperparathyroidism).  There are four surgical methods that may be used for a parathyroidectomy: open, minimally invasive, video-assisted, and endoscopic.  Generally, this is a safe procedure. However, problems may occur, including bleeding, infection, and a hoarse or weak voice. This  information is not intended to replace advice given to you by your health care provider. Make sure you discuss any questions you have with your health care provider. Document Released: 12/07/2008 Document Revised: 08/23/2017 Document Reviewed: 07/16/2017 Elsevier Patient Education  2020 [employer].  Peritoneal Dialysis Information  Peritoneal dialysis is a procedure that filters your blood. You may have this procedure if your kidneys are not working well. You can perform peritoneal dialysis yourself, or a machine can do it for you at night when you sleep.  Tell a health care provider about: Any allergies you have. All medicines you are taking, including vitamins, herbs, eye drops, creams, and over-the-counter medicines. Any problems you or family members have had with anesthetic medicines. Any blood disorders you have. Any surgeries you have. Any medical conditions you have. Whether you are pregnant or may be pregnant. What are the risks? Generally, peritoneal dialysis is safe. However, problems may occur, including: Infection in the lining of your abdomen (peritoneum). This is the most common problem. Infection in the area where the catheter was inserted. Discomfort in the area where the catheter was inserted. Weakened abdominal muscles. This may lead to a hernia, which can cause problems if left untreated. What happens before treatment? It is important to safely prepare for  treatment and take steps to prevent infection. Your health care provider will teach you how to prepare for a dialysis session. Preparation may involve: Closing doors and windows in the room where dialysis will be performed. Making sure to wash your hands before and during treatment. Anyone that touches you or the equipment should also wash his or her hands often. Putting on a mask. Making sure that tubing and equipment are germ-free (sterile). Checking the bag of fluid (dialysate) you will use during the session,  to make sure it is sealed and free of germs (uncontaminated). What happens during treatment? At the start of a session, your abdomen is filled with dialysate. The dialysate pulls waste, salt, and extra water through the peritoneum. At the end of the session, the dialysate, plus all the waste it pulled from your blood, is drained from your body. There are two kinds of peritoneal dialysis: Continuous cycling peritoneal dialysis (CCPD). In this type, a machine called a cycler fills and drains your abdomen (performs exchanges) for you while you sleep. Continuous ambulatory peritoneal dialysis (CAPD). In this type, you perform exchanges for yourself up to 5 times a day. Each exchange takes about 30-40 minutes. The amount of time the dialysate stays in your body (the dwell) usually varies from 1.5-3 hours. You may go about your day normally between exchanges. Some people need to have a combination of CAPD and CCPD. What can I expect after treatment? You may need to have lab work or other tests done to check on how well the dialysis is working. Change the bandage (dressing) around your permanent catheter as directed by your health care provider. Keep the dressing clean and dry. Weigh yourself after the treatment and write down your weight. Follow these instructions at home: Eating and drinking Follow your health care provider's instructions about diet. You should follow a diet plan that includes: Nutritional counseling with a dietitian. Vitamin supplements. High-quality proteins, such as meat, poultry, fish, and eggs. Most people on peritoneal dialysis need to eat a high-protein diet because protein is lost during the dialysis exchange. Preventing constipation Avoid becoming constipated. Constipation prevents dialysate from draining well. To prevent constipation: Eat foods that are high in fiber, such as fresh fruits and vegetables, whole grains, and beans. Limit foods that are high in fat and processed  sugars, such as fried and sweet foods. Be active. Go to the restroom when you feel that you need to. Do not hold it in. Take over-the-counter or prescription medicines such as laxatives only if your health care provider recommends them. General instructions Keep a strict schedule. Dialysis must be done every day. Do not skip a day or an exchange. Make time for each exchange. Always keep the dialysate bags and other supplies in a cool, clean, and dry place. Take over-the-counter and prescription medicines only as told by your health care provider. Weigh yourself every day. Sudden weight gain may be a sign of a problem. Keep all follow-up visits as told by your health care provider. This is important. Where to find more information You can find more information about peritoneal dialysis treatment from: National [employer] and [employer] and Kid[employer] Diseases: [employer] [place]: www.kidney.org/atoz/content/peritoneal Contact a health care provider if: You have a fever or chills. You feel nauseous or you vomit. You have diarrhea. You have any problems with an exchange. Your blood pressure increases. You suddenly gain weight or feel short of breath. The catheter seems loose or feels like it is coming  out. The fluid that has drained from your abdomen is pinkish or reddish. Women having their menstrual period do not need to seek medical care if the fluid is only a little pink or red. There are white strands in the dialysate that are large enough to get stuck in your tubing or catheter. Get help right away if: The area around the catheter swells or becomes red, tender, or painful. There is pus coming from the catheter area. The fluid that has drained from your abdomen is cloudy. You feel more abdominal pain or discomfort. Summary Peritoneal dialysis is a procedure that filters your blood.  You may have this procedure if your kidneys are not working well. CAPD and CCPD are the two kinds of peritoneal dialysis. Your health care provider will help you decide which kind is best for you. The main risks of peritoneal dialysis are infection and weakened abdominal muscles, which may lead to a hernia. This information is not intended to replace advice given to you by your health care provider. Make sure you discuss any questions you have with your health care provider. Document Released: 07/08/2009 Document Revised: 01/01/2019 Document Reviewed: 12/20/2016 Elsevier Patient Education  2020 [employer].

## 2019-04-18 NOTE — Progress Notes (Signed)
Discharged home today, wife driving patient  Home. Personal belongings,discharged instructions given to patient. Advised to pick up medications called in to pharmacy of choice.

## 2019-04-19 LAB — PARATHYROID HORMONE, INTACT (NO CA): PTH: 387 pg/mL — ABNORMAL HIGH (ref 15–65)

## 2019-05-11 HISTORY — PX: CARDIAC CATHETERIZATION: SHX172

## 2019-06-17 ENCOUNTER — Other Ambulatory Visit: Payer: Self-pay | Admitting: Nephrology

## 2019-06-17 ENCOUNTER — Ambulatory Visit
Admission: RE | Admit: 2019-06-17 | Discharge: 2019-06-17 | Disposition: A | Discharge status: Home / Self Care | Payer: Medicare Other | Source: Ambulatory Visit | Attending: Nephrology | Admitting: Nephrology

## 2019-06-17 DIAGNOSIS — R053 Chronic cough: Secondary | ICD-10-CM

## 2019-06-17 DIAGNOSIS — R05 Cough: Secondary | ICD-10-CM

## 2019-06-17 DIAGNOSIS — E8779 Other fluid overload: Secondary | ICD-10-CM

## 2019-06-30 ENCOUNTER — Other Ambulatory Visit: Payer: Self-pay

## 2019-06-30 ENCOUNTER — Ambulatory Visit (INDEPENDENT_AMBULATORY_CARE_PROVIDER_SITE_OTHER): Payer: 59 | Admitting: Critical Care Medicine

## 2019-06-30 ENCOUNTER — Encounter: Payer: Self-pay | Admitting: Critical Care Medicine

## 2019-06-30 VITALS — BP 120/74 | HR 91 | Temp 98.4°F | Ht 72.0 in | Wt 218.0 lb

## 2019-06-30 DIAGNOSIS — R05 Cough: Secondary | ICD-10-CM

## 2019-06-30 DIAGNOSIS — K219 Gastro-esophageal reflux disease without esophagitis: Secondary | ICD-10-CM

## 2019-06-30 DIAGNOSIS — R059 Cough, unspecified: Secondary | ICD-10-CM

## 2019-06-30 MED ORDER — ALBUTEROL SULFATE HFA 108 (90 BASE) MCG/ACT IN AERS: 2.0000 | INHALATION_SPRAY | Freq: Four times a day (QID) | RESPIRATORY_TRACT | 2 refills | Status: DC | PRN

## 2019-06-30 NOTE — Progress Notes (Signed)
Synopsis: Referred in October 2020 for chronic cough by [name], MD.   Subjective:   PATIENT ID: Phillip Blair GENDER: male DOB: 1955/12/30, MRN: [id number]  Chief Complaint  Patient presents with   Consult    Referred by PCP for chronic cough. Non-productive cough. Cough is forcefull to the point the patient feels like he will throw up.     Phillip Blair is a 63 year old gentleman with a history of polycystic kidney disease on peritoneal dialysis who presents for evaluation of cough for the last 3 to 4 months.  His cough is mostly dry, occasionally productive of sputum.  His his sputum is white or brown.  He describes his coughing episodes as severe and violent, sometimes making him feel as though he is about to vomit.  These episodes can happen at irregular intervals, sometimes hours apart.  He was treated with Ladona Ridgel and an antibiotic when this started, which did not resolve his symptoms.  He has had chest x-rays, which were unrevealing.  He does not have symptoms when sleeping overnight, but does first thing in the morning and throughout the day.  He noticed some benefit when his fluid removal with dialysis was increased several weeks ago, but had no benefit when he was started on Advair, which she has subsequently stopped.  He has a history of heartburn which resolved on omeprazole, but his cough persisted.  He is not on an ACE inhibitor and has not been for a while.  He has no history of asthma, although his son has asthma.  There is no family history of chronic lung disease.  He denies fevers, chills, sweats, unplanned weight loss, change in appetite.  He has no seasonal allergies or rhinorrhea, but occasionally complains of postnasal drip.  He has tried azelastine in the past.  He is a never smoker/vaper.  He is undergoing evaluation for kidney transplant; he is having repeat heart catheterization to address a CTO in late October.      Past Medical History:    Diagnosis Date   Allergy    Anemia    low iron   Chronic kidney disease    denies   Coronary artery disease    02/06/19: Mild non-obstructive disease on left, 100% mid RCA with left-to-right collaterals. No PCI. [facility])   ESRD (end stage renal disease) ([place])    s/p LUE basilic vein transpositiono 2017; s/p peritoneal dialysis catheter 12/31/17   Family history of kidney stone    GERD (gastroesophageal reflux disease)    occ   Gout    Hypercalcemia    Hyperlipidemia    Hypertension    does not see a cardiologist   Parathyroid adenoma    Polycystic kidney disease    Renal cancer ([place])    kidney   Sleep apnea    no longer uses cpap, has lost weight     Family History  Problem Relation Age of Onset   Polycystic kidney disease Mother    Blindness Father        passed from [place]    Colon cancer Neg Hx    Rectal cancer Neg Hx    Stomach cancer Neg Hx    Liver cancer Neg Hx    Esophageal cancer Neg Hx      Past Surgical History:  Procedure Laterality Date   AV FISTULA PLACEMENT Left 03/30/2016   Procedure: FIRST STAGE BASILIC VEIN TRANSPOSITION LEFT UPPER ARM;  Surgeon: Butch Penny  Trula Slade, MD;  Location: [place];  Service: Vascular;  Laterality: Left;   [place] Left 06/13/2016   Procedure: SECOND STAGE BASILIC VEIN TRANSPOSITION;  Surgeon: Serafina Mitchell, MD;  Location: [facility];  Service: Vascular;  Laterality: Left;   COLONOSCOPY     ESOPHAGOGASTRODUODENOSCOPY (EGD) WITH PROPOFOL N/A 01/03/2018   Procedure: ESOPHAGOGASTRODUODENOSCOPY (EGD) WITH PROPOFOL;  Surgeon: Jackquline Denmark, MD;  Location: [facility] ENDOSCOPY;  Service: Endoscopy;  Laterality: N/A;   MINIMALLY INVASIVE RADIOACTIVE PARATHYROIDECTOMY N/A 05/04/2013   Procedure: PARATHYROIDECTOMY MINIMALLY INVASIVE;  Surgeon: Harl Bowie, MD;  Location: [place];  Service: General;  Laterality: N/A;   MINIMALLY INVASIVE RADIOACTIVE PARATHYROIDECTOMY N/A 10/20/2013    Procedure: MINIMALLY INVASIVE PARATHYROIDECTOMY CONVERTED TO COMPLETE NECK EXPLORATION;  Surgeon: Harl Bowie, MD;  Location: [place];  Service: General;  Laterality: N/A;   NASAL SEPTOPLASTY W/ TURBINOPLASTY     PARATHYROIDECTOMY  04/16/2019   PARATHYROIDECTOMY N/A 04/16/2019   Procedure: NECK EXPLORATION AND PARATHYROIDECTOMY;  Surgeon: Armandina Gemma, MD;  Location: [place];  Service: General;  Laterality: N/A;   ROBOT ASSISTED LAPAROSCOPIC NEPHRECTOMY Left 01/19/2015   Procedure: ROBOTIC ASSISTED LAPAROSCOPIC RADICAL NEPHRECTOMY AND INTRAOPERATIVE ULTRASOUND ;  Surgeon: Alexis Frock, MD;  Location: [facility];  Service: Urology;  Laterality: Left;   THYROID LOBECTOMY Right 04/16/2019   Procedure: RIGHT THYROID LOBECTOMY;  Surgeon: Armandina Gemma, MD;  Location: [facility];  Service: General;  Laterality: Right;   VASECTOMY      Social History   Socioeconomic History   Marital status: Married    Spouse name: Not on file   Number of children: 2   Years of education: Not on file   Highest education level: Not on file  Occupational History   Occupation: customer service  Social Needs   Financial resource strain: Not on file   Food insecurity    Worry: Not on file    Inability: Not on file   Transportation needs    Medical: Not on file    Non-medical: Not on file  Tobacco Use   Smoking status: Never Smoker   Smokeless tobacco: Never Used  Substance and Sexual Activity   Alcohol use: Yes    Alcohol/week: 2.0 standard drinks    Types: 2 Glasses of wine per week    Comment: occasional   Drug use: No   Sexual activity: Not on file  Lifestyle   Physical activity    Days per week: Not on file    Minutes per session: Not on file   Stress: Not on file  Relationships   Social connections    Talks on phone: Not on file    Gets together: Not on file    Attends religious service: Not on file    Active member of club or organization: Not on file    Attends meetings of  clubs or organizations: Not on file    Relationship status: Not on file   Intimate partner violence    Fear of current or ex partner: Not on file    Emotionally abused: Not on file    Physically abused: Not on file    Forced sexual activity: Not on file  Other Topics Concern   Not on file  Social History Narrative   Patient is married, he has 2 children he works in Therapist, art   Occasional alcohol never smoker no drug use     Allergies  Allergen Reactions   Ivp Dye [Iodinated Diagnostic Agents] Itching and Other (  See Comments)    SKIN PEELS   Iodine-131 Itching and Rash     Immunization History  Administered Date(s) Administered   Influenza Split 07/09/2011, 08/11/2012   Influenza Whole 07/26/2010   Influenza-Unspecified 08/28/2013   Pneumococcal Polysaccharide-23 01/20/2015   Tdap 08/10/2011    Outpatient Medications Prior to Visit  Medication Sig Dispense Refill   allopurinol (ZYLOPRIM) 100 MG tablet Take 100 mg by mouth daily.     amLODipine (NORVASC) 10 MG tablet Take 10 mg by mouth every morning.      b complex-vitamin c-folic acid (NEPHRO-VITE) 0.8 MG TABS tablet Take 1 tablet by mouth daily.  3   BRILINTA 90 MG TABS tablet PLEASE SEE ATTACHED FOR DETAILED DIRECTIONS     calcitRIOL (ROCALTROL) 0.5 MCG capsule Take 2 capsules (1 mcg total) by mouth daily. 30 capsule 0   calcium carbonate (OS-CAL - DOSED IN MG OF ELEMENTAL CALCIUM) 1250 (500 Ca) MG tablet Take 1 tablet (500 mg of elemental calcium total) by mouth 4 (four) times daily - after meals and at bedtime. 60 tablet 0   ferric citrate (AURYXIA) 1 GM 210 MG(Fe) tablet Take 210 mg by mouth 3 (three) times daily with meals.     Fluticasone-Salmeterol (WIXELA INHUB) 250-50 MCG/DOSE AEPB Inhale 1 puff into the lungs 2 (two) times daily.     HYDROcodone-acetaminophen (NORCO/VICODIN) 5-325 MG tablet Take 1-2 tablets by mouth every 4 (four) hours as needed for moderate pain. 30 tablet 0    ipratropium (ATROVENT) 0.06 % nasal spray Place 2 sprays into both nostrils 4 (four) times daily.   2   lanthanum (FOSRENOL) 1000 MG chewable tablet Chew 1,000 mg by mouth 3 (three) times daily with meals.      pantoprazole (PROTONIX) 40 MG tablet Take 40 mg by mouth daily.     rosuvastatin (CRESTOR) 5 MG tablet Take 5 mg by mouth every Monday, Wednesday, and Friday.     sodium bicarbonate 325 MG tablet Take 650 mg by mouth 2 (two) times daily.     No facility-administered medications prior to visit.     Review of Systems  Constitutional: Negative for chills, fever and malaise/fatigue.       Planned weight loss, no change in appetite.  HENT: Negative for congestion and sore throat.   Eyes: Negative for blurred vision and double vision.  Respiratory: Positive for cough and sputum production. Negative for shortness of breath.   Cardiovascular: Negative for chest pain, palpitations and leg swelling.  Gastrointestinal: Negative for diarrhea, heartburn, nausea and vomiting.  Genitourinary: Negative for dysuria.  Musculoskeletal: Negative for joint pain and myalgias.  Skin: Negative for rash.  Neurological: Negative for sensory change and weakness.       Dizziness when standing up from squatting  Endo/Heme/Allergies: Negative.   Psychiatric/Behavioral: Negative.      Objective:   Vitals:   06/30/19 1557  BP: 120/74  Pulse: 91  Temp: 98.4 F (36.9 C)  TempSrc: Temporal  SpO2: 100%  Weight: 218 lb (98.9 kg)  Height: 6' (1.829 m)   100% on  RA BMI Readings from Last 3 Encounters:  06/30/19 29.57 kg/m  04/18/19 29.90 kg/m  04/15/19 29.74 kg/m   Wt Readings from Last 3 Encounters:  06/30/19 218 lb (98.9 kg)  04/18/19 220 lb 7.4 oz (100 kg)  04/15/19 219 lb 4.8 oz (99.5 kg)    Physical Exam Vitals signs reviewed.  Constitutional:      General: He is not in acute distress.  Appearance: Normal appearance. He is obese. He is not ill-appearing.  HENT:     Head:  Normocephalic and atraumatic.     Nose:     Comments: Deferred due to masking requirement.    Mouth/Throat:     Comments: Deferred due to masking requirement. Eyes:     General: No scleral icterus. Neck:     Musculoskeletal: Neck supple.  Cardiovascular:     Rate and Rhythm: Normal rate and regular rhythm.     Heart sounds: No murmur.  Pulmonary:     Comments: Breathing comfortably on room air, no conversational dyspnea.  Occasional paroxysms of dry cough.  Clear to auscultation bilaterally.  Coughing associated with deep breathing. Abdominal:     General: There is no distension.     Palpations: Abdomen is soft.     Tenderness: There is no abdominal tenderness.     Comments: PD access on the left  Musculoskeletal:        General: No swelling or deformity.  Lymphadenopathy:     Cervical: No cervical adenopathy.  Skin:    General: Skin is warm and dry.     Findings: No rash.  Neurological:     General: No focal deficit present.     Mental Status: He is alert.     Motor: No weakness.     Coordination: Coordination normal.  Psychiatric:        Mood and Affect: Mood normal.        Behavior: Behavior normal.      CBC    Component Value Date/Time   WBC 9.3 04/15/2019 1140   RBC 3.45 (L) 04/15/2019 1140   HGB 10.9 (L) 04/16/2019 0809   HCT 32.0 (L) 04/16/2019 0809   PLT 270 04/15/2019 1140   MCV 97.7 04/15/2019 1140   MCH 30.7 04/15/2019 1140   MCHC 31.5 04/15/2019 1140   RDW 15.2 04/15/2019 1140   LYMPHSABS 2.4 08/04/2012 0839   MONOABS 0.7 08/04/2012 0839   EOSABS 0.4 08/04/2012 0839   BASOSABS 0.0 08/04/2012 0839     Chest Imaging- films reviewed: CXR 06/17/2019, reviewed- increased opacification bilateral lower lobes, especially left lower lobe adjacent to the cardiac apex.  CT abdomen 12/2017-lower lobe scarring bilaterally  Pulmonary Functions Testing Results: No flowsheet data found.   Echocardiogram: LVEF 54%, no diastolic dysfunction, normal wall  motion.  Normal RV.  Mildly dilated left atrium but normal right atrium.  Heart Catheterization August 2020: CTO RCA, able to be fully intervened upon.  Planning repeat cath in the future. 02/06/2019- nonobstructive coronary artery disease, 100% mid RCA CTO with left-to-right collaterals. Planning for repeat heart cath 07/17/2019 Assessment & Plan:     ICD-10-CM   1. Gastroesophageal reflux disease without esophagitis  K21.9   2. Cough  R05 albuterol (VENTOLIN HFA) 108 (90 Base) MCG/ACT inhaler    CT Chest Wo Contrast    Respiratory or Resp and Sputum Culture    Fungus Culture with Smear     MYCOBACTERIA, CULTURE, WITH FLUOROCHROME SMEAR   Chronic cough of unclear etiology.  Did not improve when his heartburn improved on a PPI.  No ACE inhibitor.  This is been going on for several months, which is worrisome as he is going through renal transplant work-up, although without B symptoms chronic infection is less likely.  Low suspicion for asthma with no improvement on LABA-ICS. -Chest CT to evaluate bilateral lower lobes -Sputum cultures -Trial of albuterol in the morning and PRN. -Continue PPI and  dietary changes for GERD -We will consider starting Flonase at next visit -After witnessing cough paroxysms, I do not think he would be able to make it through pulmonary function studies at this time. -If other work-up is unrevealing, we will need to investigate if his cough began after his parathyroid surgery, in which case it may be best managed with amitriptyline or gabapentin. Cough has been described after thyroid surgeries  ([email or web address]) and associated with parathyroid adenomas.  Cough is also been described as a presenting symptom with ectopic lung calcification related hyperparathyroidism.  GERD -Continue PPI  Obesity -Recommend modest weight loss as a long-term goal  RTC in 4 to 6 weeks after chest CT.    Current Outpatient  Medications:    allopurinol (ZYLOPRIM) 100 MG tablet, Take 100 mg by mouth daily., Disp: , Rfl:    amLODipine (NORVASC) 10 MG tablet, Take 10 mg by mouth every morning. , Disp: , Rfl:    b complex-vitamin c-folic acid (NEPHRO-VITE) 0.8 MG TABS tablet, Take 1 tablet by mouth daily., Disp: , Rfl: 3   BRILINTA 90 MG TABS tablet, PLEASE SEE ATTACHED FOR DETAILED DIRECTIONS, Disp: , Rfl:    calcitRIOL (ROCALTROL) 0.5 MCG capsule, Take 2 capsules (1 mcg total) by mouth daily., Disp: 30 capsule, Rfl: 0   calcium carbonate (OS-CAL - DOSED IN MG OF ELEMENTAL CALCIUM) 1250 (500 Ca) MG tablet, Take 1 tablet (500 mg of elemental calcium total) by mouth 4 (four) times daily - after meals and at bedtime., Disp: 60 tablet, Rfl: 0   ferric citrate (AURYXIA) 1 GM 210 MG(Fe) tablet, Take 210 mg by mouth 3 (three) times daily with meals., Disp: , Rfl:    Fluticasone-Salmeterol (WIXELA INHUB) 250-50 MCG/DOSE AEPB, Inhale 1 puff into the lungs 2 (two) times daily., Disp: , Rfl:    HYDROcodone-acetaminophen (NORCO/VICODIN) 5-325 MG tablet, Take 1-2 tablets by mouth every 4 (four) hours as needed for moderate pain., Disp: 30 tablet, Rfl: 0   ipratropium (ATROVENT) 0.06 % nasal spray, Place 2 sprays into both nostrils 4 (four) times daily. , Disp: , Rfl: 2   lanthanum (FOSRENOL) 1000 MG chewable tablet, Chew 1,000 mg by mouth 3 (three) times daily with meals. , Disp: , Rfl:    pantoprazole (PROTONIX) 40 MG tablet, Take 40 mg by mouth daily., Disp: , Rfl:    rosuvastatin (CRESTOR) 5 MG tablet, Take 5 mg by mouth every Monday, Wednesday, and Friday., Disp: , Rfl:    sodium bicarbonate 325 MG tablet, Take 650 mg by mouth 2 (two) times daily., Disp: , Rfl:    albuterol (VENTOLIN HFA) 108 (90 Base) MCG/ACT inhaler, Inhale 2 puffs into the lungs every 6 (six) hours as needed for wheezing or shortness of breath (cough)., Disp: 6.7 g, Rfl: 2   Julian Hy, DO [facility] 06/30/2019 5:28 PM

## 2019-06-30 NOTE — Patient Instructions (Addendum)
Thank you for visiting Dr. Carlis Abbott at [facility]. We recommend the following: Orders Placed This Encounter  Procedures  . Respiratory or Resp and Sputum Culture  . Fungus Culture with Smear  .  MYCOBACTERIA, CULTURE, WITH FLUOROCHROME SMEAR  . CT Chest Wo Contrast   Orders Placed This Encounter  Procedures  . Respiratory or Resp and Sputum Culture    Standing Status:   Future    Standing Expiration Date:   06/29/2020  . Fungus Culture with Smear    Standing Status:   Future    Standing Expiration Date:   06/29/2020  .  MYCOBACTERIA, CULTURE, WITH FLUOROCHROME SMEAR    Standing Status:   Future    Standing Expiration Date:   12/28/2020  . CT Chest Wo Contrast    In next few weeks    Standing Status:   Future    Standing Expiration Date:   08/29/2020    Order Specific Question:   ** REASON FOR EXAM (FREE TEXT)    Answer:   pre-transplant evaluation of cough    Order Specific Question:   Preferred imaging location?    Answer:   [facility]    Order Specific Question:   Radiology Contrast Protocol - do NOT remove file path    Answer:   [email or web address]    Meds ordered this encounter  Medications  . albuterol (VENTOLIN HFA) 108 (90 Base) MCG/ACT inhaler    Sig: Inhale 2 puffs into the lungs every 6 (six) hours as needed for wheezing or shortness of breath (cough).    Dispense:  6.7 g    Refill:  2    Return in about 4 weeks (around 07/28/2019).    Please do your part to reduce the spread of COVID-19.

## 2019-06-30 NOTE — Progress Notes (Signed)
   Subjective:    Patient ID: Phillip Blair, male    DOB: [birth year], 63 y.o.   MRN: [id number]  HPI    Review of Systems  Constitutional: Negative for fever and unexpected weight change.  HENT: Negative for congestion, dental problem, ear pain, nosebleeds, postnasal drip, rhinorrhea, sinus pressure, sneezing, sore throat and trouble swallowing.   Eyes: Negative for redness and itching.  Respiratory: Positive for cough. Negative for chest tightness, shortness of breath and wheezing.   Cardiovascular: Negative for palpitations and leg swelling.  Gastrointestinal: Negative for nausea and vomiting.  Genitourinary: Negative for dysuria.  Musculoskeletal: Negative for joint swelling.  Skin: Negative for rash.  Allergic/Immunologic: Negative.  Negative for environmental allergies, food allergies and immunocompromised state.  Neurological: Negative for headaches.  Hematological: Does not bruise/bleed easily.  Psychiatric/Behavioral: Negative for dysphoric mood. The patient is not nervous/anxious.        Objective:   Physical Exam        Assessment & Plan:

## 2019-07-01 ENCOUNTER — Other Ambulatory Visit: Payer: Medicare Other

## 2019-07-01 DIAGNOSIS — R059 Cough, unspecified: Secondary | ICD-10-CM

## 2019-07-01 DIAGNOSIS — R05 Cough: Secondary | ICD-10-CM

## 2019-07-03 ENCOUNTER — Ambulatory Visit
Admission: RE | Admit: 2019-07-03 | Discharge: 2019-07-03 | Disposition: A | Discharge status: Home / Self Care | Payer: Medicare Other | Source: Ambulatory Visit | Attending: Critical Care Medicine | Admitting: Critical Care Medicine

## 2019-07-03 DIAGNOSIS — R059 Cough, unspecified: Secondary | ICD-10-CM

## 2019-07-03 DIAGNOSIS — R05 Cough: Secondary | ICD-10-CM

## 2019-07-07 ENCOUNTER — Other Ambulatory Visit: Payer: Medicare Other

## 2019-07-07 ENCOUNTER — Telehealth: Payer: Self-pay | Admitting: Critical Care Medicine

## 2019-07-07 DIAGNOSIS — R911 Solitary pulmonary nodule: Secondary | ICD-10-CM

## 2019-07-07 NOTE — Telephone Encounter (Signed)
I followed up with Mr. Bargar regarding his CT results- no cause of infection identified. 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   Albuterol has been helping his cough some, but it recurs after a few hours. He will restart his Advair BID and start flonase 2 sprays b/l once daily.  Julian Hy, DO 07/07/19 11:28 AM [facility]

## 2019-07-21 ENCOUNTER — Ambulatory Visit (INDEPENDENT_AMBULATORY_CARE_PROVIDER_SITE_OTHER): Payer: 59 | Admitting: Critical Care Medicine

## 2019-07-21 ENCOUNTER — Other Ambulatory Visit: Payer: Self-pay

## 2019-07-21 ENCOUNTER — Encounter: Payer: Self-pay | Admitting: Critical Care Medicine

## 2019-07-21 VITALS — BP 122/66 | HR 94 | Temp 97.6°F | Ht 72.0 in | Wt 213.4 lb

## 2019-07-21 DIAGNOSIS — R059 Cough, unspecified: Secondary | ICD-10-CM

## 2019-07-21 DIAGNOSIS — R05 Cough: Secondary | ICD-10-CM

## 2019-07-21 MED ORDER — FLUTICASONE-SALMETEROL 250-50 MCG/DOSE IN AEPB: 1.0000 | INHALATION_SPRAY | Freq: Two times a day (BID) | RESPIRATORY_TRACT | 5 refills | Status: DC

## 2019-07-21 NOTE — Patient Instructions (Addendum)
Thank you for visiting Dr. Carlis Abbott at [facility]. We recommend the following: No orders of the defined types were placed in this encounter.  No orders of the defined types were placed in this encounter.   Meds ordered this encounter  Medications  . Fluticasone-Salmeterol (ADVAIR DISKUS) 250-50 MCG/DOSE AEPB    Sig: Inhale 1 puff into the lungs 2 (two) times daily.    Dispense:  60 each    Refill:  5    Keep taking omeprazole and nasal spray daily.  Return in about 4 weeks (around 08/18/2019).    Please do your part to reduce the spread of COVID-19.

## 2019-07-21 NOTE — Progress Notes (Signed)
Synopsis: Referred in October 2020 for chronic cough by [name], MD.   Subjective:   PATIENT ID: Phillip Blair GENDER: male DOB: 1956/08/27, MRN: [id number]  Chief Complaint  Patient presents with  . Follow-up    review sputum samples and ct chest.  cough is unchanged, worse qhs and qam, prod with darker mucus qam that lightens through the day.     Phillip Blair is a 63 year old gentleman with a history of polycystic kidney disease, end-stage renal disease on peritoneal dialysis, and he is being evaluated for renal transplant at [facility].  He presents for follow-up of frequent coughing.  Since he was last seen he has been using Advair twice daily (125-50), omeprazole once daily, and started Flonase daily.  His cough is mildly improved.  It is intermittent, and there are several hours throughout the day where it does not cause problems.  His cough does not awaken him at night, but once he is awake he starts coughing.  He recently had conscious sedation for right heart catheterization, and noted that he did not cough at all.  He occasionally has thin sputum production, but not purulent sputum.  His cough has progressively worsened over time.  His heartburn has been better controlled since taking omeprazole, and this has improved his cough.  Intranasal steroids have improved his nasal congestion, but not change his cough.  He notices that he coughs more when his dialysate is instilled, and he always coughs after he drains.  He has had his seasonal flu shot this year.  He has had 3 parathyroid surgeries in the past, one most recently a few months ago.  He is following up with his surgeon soon due to having a weak voice, which he was warned may be a side effect of his surgery.  One of the parathyroid glands that was resected was close to his jugular vein.  He does not note that his cough was new immediately after any 1 of these surgeries.      OV 06/30/2019: Phillip Blair is a  63 year old gentleman with a history of polycystic kidney disease on peritoneal dialysis who presents for evaluation of cough for the last 3 to 4 months.  His cough is mostly dry, occasionally productive of sputum.  His his sputum is white or brown.  He describes his coughing episodes as severe and violent, sometimes making him feel as though he is about to vomit.  These episodes can happen at irregular intervals, sometimes hours apart.  He was treated with Ladona Ridgel and an antibiotic when this started, which did not resolve his symptoms.  He has had chest x-rays, which were unrevealing.  He does not have symptoms when sleeping overnight, but does first thing in the morning and throughout the day.  He noticed some benefit when his fluid removal with dialysis was increased several weeks ago, but had no benefit when he was started on Advair, which she has subsequently stopped.  He has a history of heartburn which resolved on omeprazole, but his cough persisted.  He is not on an ACE inhibitor and has not been for a while.  He has no history of asthma, although his son has asthma.  There is no family history of chronic lung disease.  He denies fevers, chills, sweats, unplanned weight loss, change in appetite.  He has no seasonal allergies or rhinorrhea, but occasionally complains of postnasal drip.  He has tried azelastine in the past.  He is a  never smoker/vaper.  He is undergoing evaluation for kidney transplant; he is having repeat heart catheterization to address a CTO in late October.    Past Medical History:  Diagnosis Date  . Allergy   . Anemia    low iron  . Chronic kidney disease    denies  . Coronary artery disease    02/06/19: Mild non-obstructive disease on left, 100% mid RCA with left-to-right collaterals. No PCI. [facility])  . ESRD (end stage renal disease) ([place])    s/p LUE basilic vein transpositiono 2017; s/p peritoneal dialysis catheter 12/31/17  . Family history of kidney stone   . GERD  (gastroesophageal reflux disease)    occ  . Gout   . Hypercalcemia   . Hyperlipidemia   . Hypertension    does not see a cardiologist  . Parathyroid adenoma   . Polycystic kidney disease   . Renal cancer ([place])    kidney  . Sleep apnea    no longer uses cpap, has lost weight     Family History  Problem Relation Age of Onset  . Polycystic kidney disease Mother   . Blindness Father        passed from [place]  . Asthma Child   . Colon cancer Neg Hx   . Rectal cancer Neg Hx   . Stomach cancer Neg Hx   . Liver cancer Neg Hx   . Esophageal cancer Neg Hx      Past Surgical History:  Procedure Laterality Date  . AV FISTULA PLACEMENT Left 03/30/2016   Procedure: FIRST STAGE BASILIC VEIN TRANSPOSITION LEFT UPPER ARM;  Surgeon: Serafina Mitchell, MD;  Location: [place];  Service: Vascular;  Laterality: Left;  . BASCILIC VEIN TRANSPOSITION Left 06/13/2016   Procedure: SECOND STAGE BASILIC VEIN TRANSPOSITION;  Surgeon: Serafina Mitchell, MD;  Location: [place];  Service: Vascular;  Laterality: Left;  . COLONOSCOPY    . ESOPHAGOGASTRODUODENOSCOPY (EGD) WITH PROPOFOL N/A 01/03/2018   Procedure: ESOPHAGOGASTRODUODENOSCOPY (EGD) WITH PROPOFOL;  Surgeon: Jackquline Denmark, MD;  Location: [facility] ENDOSCOPY;  Service: Endoscopy;  Laterality: N/A;  . MINIMALLY INVASIVE RADIOACTIVE PARATHYROIDECTOMY N/A 05/04/2013   Procedure: PARATHYROIDECTOMY MINIMALLY INVASIVE;  Surgeon: Harl Bowie, MD;  Location: [place];  Service: General;  Laterality: N/A;  . MINIMALLY INVASIVE RADIOACTIVE PARATHYROIDECTOMY N/A 10/20/2013   Procedure: MINIMALLY INVASIVE PARATHYROIDECTOMY CONVERTED TO COMPLETE NECK EXPLORATION;  Surgeon: Harl Bowie, MD;  Location: [place];  Service: General;  Laterality: N/A;  . NASAL SEPTOPLASTY W/ TURBINOPLASTY    . PARATHYROIDECTOMY  04/16/2019  . PARATHYROIDECTOMY N/A 04/16/2019   Procedure: NECK EXPLORATION AND PARATHYROIDECTOMY;  Surgeon: Armandina Gemma, MD;  Location: [place];  Service: General;   Laterality: N/A;  . ROBOT ASSISTED LAPAROSCOPIC NEPHRECTOMY Left 01/19/2015   Procedure: ROBOTIC ASSISTED LAPAROSCOPIC RADICAL NEPHRECTOMY AND INTRAOPERATIVE ULTRASOUND ;  Surgeon: Alexis Frock, MD;  Location: [facility];  Service: Urology;  Laterality: Left;  . THYROID LOBECTOMY Right 04/16/2019   Procedure: RIGHT THYROID LOBECTOMY;  Surgeon: Armandina Gemma, MD;  Location: [place];  Service: General;  Laterality: Right;  Marland Kitchen VASECTOMY      Social History   Socioeconomic History  . Marital status: Married    Spouse name: Not on file  . Number of children: 2  . Years of education: Not on file  . Highest education level: Not on file  Occupational History  . Occupation: Therapist, art  Social Needs  . Financial resource strain: Not on file  . Food insecurity  Worry: Not on file    Inability: Not on file  . Transportation needs    Medical: Not on file    Non-medical: Not on file  Tobacco Use  . Smoking status: Never Smoker  . Smokeless tobacco: Never Used  Substance and Sexual Activity  . Alcohol use: Yes    Alcohol/week: 2.0 standard drinks    Types: 2 Glasses of wine per week    Comment: occasional  . Drug use: No  . Sexual activity: Not on file  Lifestyle  . Physical activity    Days per week: Not on file    Minutes per session: Not on file  . Stress: Not on file  Relationships  . Social Herbalist on phone: Not on file    Gets together: Not on file    Attends religious service: Not on file    Active member of club or organization: Not on file    Attends meetings of clubs or organizations: Not on file    Relationship status: Not on file  . Intimate partner violence    Fear of current or ex partner: Not on file    Emotionally abused: Not on file    Physically abused: Not on file    Forced sexual activity: Not on file  Other Topics Concern  . Not on file  Social History Narrative   Patient is married, he has 2 children he works in Therapist, art    Occasional alcohol never smoker no drug use     Allergies  Allergen Reactions  . Ivp Dye [Iodinated Diagnostic Agents] Itching and Other (See Comments)    SKIN PEELS  . Iodine-131 Itching and Rash     Immunization History  Administered Date(s) Administered  . Influenza Split 07/09/2011, 08/11/2012  . Influenza Whole 07/26/2010  . Influenza,inj,Quad PF,6+ Mos 06/15/2019  . Influenza-Unspecified 08/28/2013  . Pneumococcal Conjugate-13 02/12/2018  . Pneumococcal Polysaccharide-23 01/20/2015  . Tdap 08/10/2011    Outpatient Medications Prior to Visit  Medication Sig Dispense Refill  . albuterol (VENTOLIN HFA) 108 (90 Base) MCG/ACT inhaler Inhale 2 puffs into the lungs every 6 (six) hours as needed for wheezing or shortness of breath (cough). 6.7 g 2  . allopurinol (ZYLOPRIM) 100 MG tablet Take 100 mg by mouth daily.    Marland Kitchen amLODipine (NORVASC) 10 MG tablet Take 10 mg by mouth every morning.     Marland Kitchen b complex-vitamin c-folic acid (NEPHRO-VITE) 0.8 MG TABS tablet Take 1 tablet by mouth daily.  3  . BRILINTA 90 MG TABS tablet Take 90 mg by mouth 2 (two) times daily.     . calcitRIOL (ROCALTROL) 0.5 MCG capsule Take 2 capsules (1 mcg total) by mouth daily. 30 capsule 0  . Calcium Acetate 667 MG TABS Take 1 tablet by mouth 3 (three) times daily with meals.    . ferric citrate (AURYXIA) 1 GM 210 MG(Fe) tablet Take 210 mg by mouth 3 (three) times daily with meals.    . fluticasone (FLONASE) 50 MCG/ACT nasal spray Place 1 spray into both nostrils daily.    Marland Kitchen omeprazole (PRILOSEC) 20 MG capsule Take 20 mg by mouth daily.    . rosuvastatin (CRESTOR) 5 MG tablet Take 5 mg by mouth every Monday, Wednesday, and Friday.    . sodium bicarbonate 325 MG tablet Take 650 mg by mouth 2 (two) times daily.    . pantoprazole (PROTONIX) 40 MG tablet Take 40 mg by mouth daily.    Marland Kitchen  calcium carbonate (OS-CAL - DOSED IN MG OF ELEMENTAL CALCIUM) 1250 (500 Ca) MG tablet Take 1 tablet (500 mg of elemental calcium  total) by mouth 4 (four) times daily - after meals and at bedtime. (Patient not taking: Reported on 07/21/2019) 60 tablet 0  . Fluticasone-Salmeterol (WIXELA INHUB) 250-50 MCG/DOSE AEPB Inhale 1 puff into the lungs 2 (two) times daily.    Marland Kitchen HYDROcodone-acetaminophen (NORCO/VICODIN) 5-325 MG tablet Take 1-2 tablets by mouth every 4 (four) hours as needed for moderate pain. (Patient not taking: Reported on 07/21/2019) 30 tablet 0  . ipratropium (ATROVENT) 0.06 % nasal spray Place 2 sprays into both nostrils 4 (four) times daily.   2  . lanthanum (FOSRENOL) 1000 MG chewable tablet Chew 1,000 mg by mouth 3 (three) times daily with meals.      No facility-administered medications prior to visit.     Review of Systems  Constitutional: Negative for chills and fever.  HENT: Negative for congestion.   Respiratory: Positive for cough. Negative for hemoptysis and shortness of breath.   Cardiovascular: Negative for chest pain and leg swelling.  Gastrointestinal: Negative for heartburn, nausea and vomiting.  Endo/Heme/Allergies: Negative for environmental allergies.     Objective:   Vitals:   07/21/19 1349  BP: 122/66  Pulse: 94  Temp: 97.6 F (36.4 C)  TempSrc: Oral  SpO2: 98%  Weight: 213 lb 6.4 oz (96.8 kg)  Height: 6' (1.829 m)   98% on  RA BMI Readings from Last 3 Encounters:  07/21/19 28.94 kg/m  06/30/19 29.57 kg/m  04/18/19 29.90 kg/m   Wt Readings from Last 3 Encounters:  07/21/19 213 lb 6.4 oz (96.8 kg)  06/30/19 218 lb (98.9 kg)  04/18/19 220 lb 7.4 oz (100 kg)    Physical Exam Vitals signs reviewed.  Constitutional:      Appearance: Normal appearance. He is not ill-appearing or diaphoretic.  HENT:     Head: Normocephalic and atraumatic.     Nose:     Comments: Deferred due to masking requirement.    Mouth/Throat:     Comments: Deferred due to masking requirement. Eyes:     General: No scleral icterus. Neck:     Musculoskeletal: Neck supple.  Cardiovascular:      Rate and Rhythm: Normal rate and regular rhythm.     Heart sounds: No murmur.  Pulmonary:     Comments: Breathing comfortably on room air, infrequent dry cough.  Clear to auscultation bilaterally. Abdominal:     General: There is no distension.     Palpations: Abdomen is soft.     Tenderness: There is no abdominal tenderness.  Lymphadenopathy:     Cervical: No cervical adenopathy.  Skin:    General: Skin is warm and dry.     Findings: No rash.  Neurological:     General: No focal deficit present.     Mental Status: He is alert.     Motor: No weakness.     Coordination: Coordination normal.  Psychiatric:        Mood and Affect: Mood normal.        Behavior: Behavior normal.      CBC    Component Value Date/Time   WBC 9.3 04/15/2019 1140   RBC 3.45 (L) 04/15/2019 1140   HGB 10.9 (L) 04/16/2019 0809   HCT 32.0 (L) 04/16/2019 0809   PLT 270 04/15/2019 1140   MCV 97.7 04/15/2019 1140   MCH 30.7 04/15/2019 1140   MCHC 31.5 04/15/2019 1140  RDW 15.2 04/15/2019 1140   LYMPHSABS 2.4 08/04/2012 0839   MONOABS 0.7 08/04/2012 0839   EOSABS 0.4 08/04/2012 0839   BASOSABS 0.0 08/04/2012 0839   Micro: 07/01/2019 respiratory culture-normal flora 07/01/2019 sputum fungus-NGTD 07/01/2019 sputum AFB- NGTD  04/13/2019 Covid negative  Chest Imaging- films reviewed: CXR 06/17/2019, reviewed- increased opacification bilateral lower lobes, especially left lower lobe adjacent to the cardiac apex. CT abdomen 12/2017-lower lobe scarring bilaterally CT chest 07/03/2019-mild groundglass opacities bilateral bases.  Mild airway thickening.  Patulous esophagus.  No significant mediastinal or hilar adenopathy.  Tiny centrilobular nodules throughout.  Pulmonary Functions Testing Results: No flowsheet data found.   Echocardiogram: LVEF 16%, no diastolic dysfunction, normal wall motion.  Normal RV.  Mildly dilated left atrium but normal right atrium.  Heart Catheterization August 2020: CTO  RCA, able to be fully intervened upon.  Planning repeat cath in the future. 02/06/2019- nonobstructive coronary artery disease, 100% mid RCA CTO with left-to-right collaterals. Planning for repeat heart cath 07/17/2019 Assessment & Plan:     ICD-10-CM   1. Cough  R05 Fluticasone-Salmeterol (ADVAIR DISKUS) 250-50 MCG/DOSE AEPB     Groundglass nodule -Follow up CT scan 6 months (previously ordered)  Chronic cough of unclear etiology.  He has minimally improved on LABA-ICS, PPI, Flonase.  Not on an ACE inhibitor.  Question if this is related to a neurogenic source due to previous surgeries.  His association with peritoneal dialysis raises questions of if he could have diaphragmatic irritation or translocation of fluid.  Although bronchiectasis is more commonly seen in those with autosomal- dominant PCKD, his CT rules this out.   -Sputum cultures (AFB & fungal) pending- at this point less likely to explain his symptoms. -Continue Advair twice daily- increase to moderate dose; rinse mouth after every use -Continue PPI and dietary changes for GERD -Continue Flonase once daily -I have asked him to discuss this at his upcoming visit with the surgeon to determine if it is possible that it could be related.  ([email or web address]) and associated with parathyroid adenomas.   -Cough is also been described as a presenting symptom with ectopic lung calcification related hyperparathyroidism.  GERD -Continue PPI  Obesity -Recommend modest weight loss as a long-term goal  RTC in about 1 month.   Current Outpatient Medications:  .  albuterol (VENTOLIN HFA) 108 (90 Base) MCG/ACT inhaler, Inhale 2 puffs into the lungs every 6 (six) hours as needed for wheezing or shortness of breath (cough)., Disp: 6.7 g, Rfl: 2 .  allopurinol (ZYLOPRIM) 100 MG tablet, Take 100 mg by mouth daily., Disp: , Rfl:  .  amLODipine (NORVASC) 10 MG tablet, Take 10 mg by mouth  every morning. , Disp: , Rfl:  .  b complex-vitamin c-folic acid (NEPHRO-VITE) 0.8 MG TABS tablet, Take 1 tablet by mouth daily., Disp: , Rfl: 3 .  BRILINTA 90 MG TABS tablet, Take 90 mg by mouth 2 (two) times daily. , Disp: , Rfl:  .  calcitRIOL (ROCALTROL) 0.5 MCG capsule, Take 2 capsules (1 mcg total) by mouth daily., Disp: 30 capsule, Rfl: 0 .  Calcium Acetate 667 MG TABS, Take 1 tablet by mouth 3 (three) times daily with meals., Disp: , Rfl:  .  ferric citrate (AURYXIA) 1 GM 210 MG(Fe) tablet, Take 210 mg by mouth 3 (three) times daily with meals., Disp: , Rfl:  .  fluticasone (FLONASE) 50 MCG/ACT nasal spray, Place 1 spray into both nostrils daily., Disp: , Rfl:  .  omeprazole (PRILOSEC) 20 MG capsule,  Take 20 mg by mouth daily., Disp: , Rfl:  .  rosuvastatin (CRESTOR) 5 MG tablet, Take 5 mg by mouth every Monday, Wednesday, and Friday., Disp: , Rfl:  .  sodium bicarbonate 325 MG tablet, Take 650 mg by mouth 2 (two) times daily., Disp: , Rfl:  .  Fluticasone-Salmeterol (ADVAIR DISKUS) 250-50 MCG/DOSE AEPB, Inhale 1 puff into the lungs 2 (two) times daily., Disp: 60 each, Rfl: 5   Julian Hy, DO [facility] 07/21/2019 6:05 PM

## 2019-08-14 LAB — MYCOBACTERIA,CULT W/FLUOROCHROME SMEAR
MICRO NUMBER:: 965287
SMEAR:: NONE SEEN
SPECIMEN QUALITY:: ADEQUATE

## 2019-08-14 LAB — FUNGUS CULTURE W SMEAR
MICRO NUMBER:: 965286
SMEAR:: NONE SEEN
SPECIMEN QUALITY:: ADEQUATE

## 2019-08-14 LAB — RESPIRATORY CULTURE OR RESPIRATORY AND SPUTUM CULTURE
MICRO NUMBER:: 965288
RESULT:: NORMAL
SPECIMEN QUALITY:: ADEQUATE

## 2019-08-18 ENCOUNTER — Encounter: Payer: Self-pay | Admitting: Critical Care Medicine

## 2019-08-18 ENCOUNTER — Ambulatory Visit (INDEPENDENT_AMBULATORY_CARE_PROVIDER_SITE_OTHER): Payer: 59 | Admitting: Critical Care Medicine

## 2019-08-18 ENCOUNTER — Other Ambulatory Visit: Payer: Self-pay

## 2019-08-18 DIAGNOSIS — E669 Obesity, unspecified: Secondary | ICD-10-CM | POA: Diagnosis not present

## 2019-08-18 DIAGNOSIS — R05 Cough: Secondary | ICD-10-CM

## 2019-08-18 DIAGNOSIS — K219 Gastro-esophageal reflux disease without esophagitis: Secondary | ICD-10-CM | POA: Diagnosis not present

## 2019-08-18 DIAGNOSIS — R053 Chronic cough: Secondary | ICD-10-CM

## 2019-08-18 NOTE — Progress Notes (Signed)
Synopsis: Referred in October 2020 for chronic cough by [name], MD.   Subjective:   PATIENT ID: Phillip Blair GENDER: male DOB: [birth year], MRN: [id number]  Chief Complaint  Patient presents with  . Consult    Patient was seen recently for a cough. Patient states that the cough is still the same. Patient got a cath for hemodialisis yesterday.     I connected with  Phillip Blair on 08/18/19 by a video enabled telemedicine application and verified that I am speaking with the correct person using two identifiers.   I discussed the limitations of evaluation and management by telemedicine. The patient expressed understanding and agreed to proceed.      Phillip Blair is following up today for his chronic cough.  His nephrologist is concerned this is due to his peritoneal dialysis.  He had a tunneled dialysis catheter placed yesterday and he is going to do 13 treatments of HD over about a month to take a break from PD to determine if this improves his cough.  He has followed up with his surgeon who agreed it is possible that his cough could be related to his parathyroid surgeries in the past.  He admits that he has had some issues with swallowing, feeling as though he is about to choke since his surgery last summer.  If his cough resolves with HD, he will undergo fistula placement sometime in 2021.  Since starting the higher dose Advair he has not noticed an improvement in his cough.  He continues to take omeprazole and Flonase.  The Flonase has helped some with his congestion that he has in the morning and evening.  No fever, chills, or other new symptoms.  ROS otherwise negative          OV 07/21/2019: Phillip Blair is a 63 year old gentleman with a history of polycystic kidney disease, end-stage renal disease on peritoneal dialysis, and he is being evaluated for renal transplant at [facility].  He presents for follow-up of frequent coughing.  Since he was last  seen he has been using Advair twice daily (125-50), omeprazole once daily, and started Flonase daily.  His cough is mildly improved.  It is intermittent, and there are several hours throughout the day where it does not cause problems.  His cough does not awaken him at night, but once he is awake he starts coughing.  He recently had conscious sedation for right heart catheterization, and noted that he did not cough at all.  He occasionally has thin sputum production, but not purulent sputum.  His cough has progressively worsened over time.  His heartburn has been better controlled since taking omeprazole, and this has improved his cough.  Intranasal steroids have improved his nasal congestion, but not change his cough.  He notices that he coughs more when his dialysate is instilled, and he always coughs after he drains.  He has had his seasonal flu shot this year.  He has had 3 parathyroid surgeries in the past, one most recently a few months ago.  He is following up with his surgeon soon due to having a weak voice, which he was warned may be a side effect of his surgery.  One of the parathyroid glands that was resected was close to his jugular vein.  He does not note that his cough was new immediately after any 1 of these surgeries   OV 06/30/2019: Phillip Blair is a 63 year old gentleman with a history of polycystic kidney  disease on peritoneal dialysis who presents for evaluation of cough for the last 3 to 4 months.  His cough is mostly dry, occasionally productive of sputum.  His his sputum is white or brown.  He describes his coughing episodes as severe and violent, sometimes making him feel as though he is about to vomit.  These episodes can happen at irregular intervals, sometimes hours apart.  He was treated with Ladona Ridgel and an antibiotic when this started, which did not resolve his symptoms.  He has had chest x-rays, which were unrevealing.  He does not have symptoms when sleeping overnight, but  does first thing in the morning and throughout the day.  He noticed some benefit when his fluid removal with dialysis was increased several weeks ago, but had no benefit when he was started on Advair, which she has subsequently stopped.  He has a history of heartburn which resolved on omeprazole, but his cough persisted.  He is not on an ACE inhibitor and has not been for a while.  He has no history of asthma, although his son has asthma.  There is no family history of chronic lung disease.  He denies fevers, chills, sweats, unplanned weight loss, change in appetite.  He has no seasonal allergies or rhinorrhea, but occasionally complains of postnasal drip.  He has tried azelastine in the past.  He is a never smoker/vaper.  He is undergoing evaluation for kidney transplant; he is having repeat heart catheterization to address a CTO in late October.    Past Medical History:  Diagnosis Date  . Allergy   . Anemia    low iron  . Chronic kidney disease    denies  . Coronary artery disease    02/06/19: Mild non-obstructive disease on left, 100% mid RCA with left-to-right collaterals. No PCI. [facility])  . ESRD (end stage renal disease) ([place])    s/p LUE basilic vein transpositiono 2017; s/p peritoneal dialysis catheter 12/31/17  . Family history of kidney stone   . GERD (gastroesophageal reflux disease)    occ  . Gout   . Hypercalcemia   . Hyperlipidemia   . Hypertension    does not see a cardiologist  . Parathyroid adenoma   . Polycystic kidney disease   . Renal cancer ([place])    kidney  . Sleep apnea    no longer uses cpap, has lost weight     Family History  Problem Relation Age of Onset  . Polycystic kidney disease Mother   . Blindness Father        passed from [place]  . Asthma Child   . Colon cancer Neg Hx   . Rectal cancer Neg Hx   . Stomach cancer Neg Hx   . Liver cancer Neg Hx   . Esophageal cancer Neg Hx      Past Surgical History:  Procedure Laterality Date  . AV FISTULA  PLACEMENT Left 03/30/2016   Procedure: FIRST STAGE BASILIC VEIN TRANSPOSITION LEFT UPPER ARM;  Surgeon: Serafina Mitchell, MD;  Location: [place];  Service: Vascular;  Laterality: Left;  . BASCILIC VEIN TRANSPOSITION Left 06/13/2016   Procedure: SECOND STAGE BASILIC VEIN TRANSPOSITION;  Surgeon: Serafina Mitchell, MD;  Location: [place];  Service: Vascular;  Laterality: Left;  . COLONOSCOPY    . ESOPHAGOGASTRODUODENOSCOPY (EGD) WITH PROPOFOL N/A 01/03/2018   Procedure: ESOPHAGOGASTRODUODENOSCOPY (EGD) WITH PROPOFOL;  Surgeon: Jackquline Denmark, MD;  Location: [facility] ENDOSCOPY;  Service: Endoscopy;  Laterality: N/A;  . MINIMALLY INVASIVE RADIOACTIVE  PARATHYROIDECTOMY N/A 05/04/2013   Procedure: PARATHYROIDECTOMY MINIMALLY INVASIVE;  Surgeon: Harl Bowie, MD;  Location: [place];  Service: General;  Laterality: N/A;  . MINIMALLY INVASIVE RADIOACTIVE PARATHYROIDECTOMY N/A 10/20/2013   Procedure: MINIMALLY INVASIVE PARATHYROIDECTOMY CONVERTED TO COMPLETE NECK EXPLORATION;  Surgeon: Harl Bowie, MD;  Location: [place];  Service: General;  Laterality: N/A;  . NASAL SEPTOPLASTY W/ TURBINOPLASTY    . PARATHYROIDECTOMY  04/16/2019  . PARATHYROIDECTOMY N/A 04/16/2019   Procedure: NECK EXPLORATION AND PARATHYROIDECTOMY;  Surgeon: Armandina Gemma, MD;  Location: [place];  Service: General;  Laterality: N/A;  . ROBOT ASSISTED LAPAROSCOPIC NEPHRECTOMY Left 01/19/2015   Procedure: ROBOTIC ASSISTED LAPAROSCOPIC RADICAL NEPHRECTOMY AND INTRAOPERATIVE ULTRASOUND ;  Surgeon: Alexis Frock, MD;  Location: [facility];  Service: Urology;  Laterality: Left;  . THYROID LOBECTOMY Right 04/16/2019   Procedure: RIGHT THYROID LOBECTOMY;  Surgeon: Armandina Gemma, MD;  Location: [place];  Service: General;  Laterality: Right;  Marland Kitchen VASECTOMY      Social History   Socioeconomic History  . Marital status: Married    Spouse name: Not on file  . Number of children: 2  . Years of education: Not on file  . Highest education level: Not on file   Occupational History  . Occupation: Therapist, art  Social Needs  . Financial resource strain: Not on file  . Food insecurity    Worry: Not on file    Inability: Not on file  . Transportation needs    Medical: Not on file    Non-medical: Not on file  Tobacco Use  . Smoking status: Never Smoker  . Smokeless tobacco: Never Used  Substance and Sexual Activity  . Alcohol use: Yes    Alcohol/week: 2.0 standard drinks    Types: 2 Glasses of wine per week    Comment: occasional  . Drug use: No  . Sexual activity: Not on file  Lifestyle  . Physical activity    Days per week: Not on file    Minutes per session: Not on file  . Stress: Not on file  Relationships  . Social Herbalist on phone: Not on file    Gets together: Not on file    Attends religious service: Not on file    Active member of club or organization: Not on file    Attends meetings of clubs or organizations: Not on file    Relationship status: Not on file  . Intimate partner violence    Fear of current or ex partner: Not on file    Emotionally abused: Not on file    Physically abused: Not on file    Forced sexual activity: Not on file  Other Topics Concern  . Not on file  Social History Narrative   Patient is married, he has 2 children he works in Therapist, art   Occasional alcohol never smoker no drug use     Allergies  Allergen Reactions  . Ivp Dye [Iodinated Diagnostic Agents] Itching and Other (See Comments)    SKIN PEELS  . Iodine-131 Itching and Rash     Immunization History  Administered Date(s) Administered  . Influenza Split 07/09/2011, 08/11/2012  . Influenza Whole 07/26/2010  . Influenza,inj,Quad PF,6+ Mos 06/15/2019  . Influenza-Unspecified 08/28/2013  . Pneumococcal Conjugate-13 02/12/2018  . Pneumococcal Polysaccharide-23 01/20/2015  . Tdap 08/10/2011    Outpatient Medications Prior to Visit  Medication Sig Dispense Refill  . albuterol (VENTOLIN HFA) 108 (90 Base)  MCG/ACT inhaler Inhale 2  puffs into the lungs every 6 (six) hours as needed for wheezing or shortness of breath (cough). 6.7 g 2  . allopurinol (ZYLOPRIM) 100 MG tablet Take 100 mg by mouth daily.    Marland Kitchen amLODipine (NORVASC) 10 MG tablet Take 10 mg by mouth every morning.     Marland Kitchen aspirin EC 81 MG tablet Take 81 mg by mouth daily.    Marland Kitchen b complex-vitamin c-folic acid (NEPHRO-VITE) 0.8 MG TABS tablet Take 1 tablet by mouth daily.  3  . BRILINTA 90 MG TABS tablet Take 90 mg by mouth 2 (two) times daily.     . calcitRIOL (ROCALTROL) 0.5 MCG capsule Take 2 capsules (1 mcg total) by mouth daily. 30 capsule 0  . Calcium Acetate 667 MG TABS Take 1 tablet by mouth 3 (three) times daily with meals.    . ferric citrate (AURYXIA) 1 GM 210 MG(Fe) tablet Take 210 mg by mouth 3 (three) times daily with meals.    . fluticasone (FLONASE) 50 MCG/ACT nasal spray Place 1 spray into both nostrils daily.    . Fluticasone-Salmeterol (ADVAIR DISKUS) 250-50 MCG/DOSE AEPB Inhale 1 puff into the lungs 2 (two) times daily. 60 each 5  . levothyroxine (SYNTHROID) 25 MCG tablet Take 25 mcg by mouth daily before breakfast.    . omeprazole (PRILOSEC) 20 MG capsule Take 20 mg by mouth daily.    . rosuvastatin (CRESTOR) 5 MG tablet Take 5 mg by mouth every Monday, Wednesday, and Friday.    . sodium bicarbonate 325 MG tablet Take 650 mg by mouth 2 (two) times daily.     No facility-administered medications prior to visit.     Review of Systems  Constitutional: Negative for chills and fever.  HENT: Negative for congestion.   Respiratory: Positive for cough. Negative for hemoptysis and shortness of breath.   Cardiovascular: Negative for chest pain and leg swelling.  Gastrointestinal: Negative for heartburn, nausea and vomiting.  Endo/Heme/Allergies: Negative for environmental allergies.     Objective:   There were no vitals filed for this visit.   on  RA BMI Readings from Last 3 Encounters:  07/21/19 28.94 kg/m  06/30/19  29.57 kg/m  04/18/19 29.90 kg/m   Wt Readings from Last 3 Encounters:  07/21/19 213 lb 6.4 oz (96.8 kg)  06/30/19 218 lb (98.9 kg)  04/18/19 220 lb 7.4 oz (100 kg)    Physical Exam Physical exam limited by televisit.  Intact comprehension, normal speech, answering questions appropriately.  Breathing did not sound labored.  CBC    Component Value Date/Time   WBC 9.3 04/15/2019 1140   RBC 3.45 (L) 04/15/2019 1140   HGB 10.9 (L) 04/16/2019 0809   HCT 32.0 (L) 04/16/2019 0809   PLT 270 04/15/2019 1140   MCV 97.7 04/15/2019 1140   MCH 30.7 04/15/2019 1140   MCHC 31.5 04/15/2019 1140   RDW 15.2 04/15/2019 1140   LYMPHSABS 2.4 08/04/2012 0839   MONOABS 0.7 08/04/2012 0839   EOSABS 0.4 08/04/2012 0839   BASOSABS 0.0 08/04/2012 0839   Micro: 07/01/2019 respiratory culture-normal flora 07/01/2019 sputum fungus-negative 07/01/2019 sputum AFB-negative  04/13/2019 Covid negative  Chest Imaging- films reviewed: CXR 06/17/2019, reviewed- increased opacification bilateral lower lobes, especially left lower lobe adjacent to the cardiac apex.  CT abdomen 12/2017-lower lobe scarring bilaterally  CT chest 07/03/2019-mild groundglass opacities bilateral bases.  Mild airway thickening.  Patulous esophagus.  No significant mediastinal or hilar adenopathy.  Tiny centrilobular nodules throughout.  Pulmonary Functions Testing Results: No flowsheet  data found.   Echocardiogram: LVEF 11%, no diastolic dysfunction, normal wall motion.  Normal RV.  Mildly dilated left atrium but normal right atrium.  Heart Catheterization August 2020: CTO RCA, able to be fully intervened upon.  Planning repeat cath in the future. 02/06/2019- nonobstructive coronary artery disease, 100% mid RCA CTO with left-to-right collaterals. Planning for repeat heart cath 07/17/2019 Assessment & Plan:     ICD-10-CM   1. Gastroesophageal reflux disease without esophagitis  K21.9   2. Chronic cough  R05      Groundglass  nodule -Follow up CT scan 6 months (previously ordered).  Chronic cough of unclear etiology.  He has minimally improved on LABA-ICS, PPI, Flonase.  Not on an ACE inhibitor.  Question if this is related to a neurogenic source due to previous surgeries.  His association with peritoneal dialysis raises questions of if he could have diaphragmatic irritation or translocation of fluid.  Although bronchiectasis is more commonly seen in those with autosomal- dominant PCKD, his CT rules this out.   -Agree with trial of HD rather than PD -If symptoms persist in a month, will refer for ENT evaluation and pursue swallow evaluation to assess for nerve damage related to parathyroid surgeries -Okay to stop Advair -Continue omeprazole, Flonase  GERD -Continue PPI  Obesity -Recommend modest weight loss as a long-term goal  Follow-up 4 weeks via televisit.   Current Outpatient Medications:  .  albuterol (VENTOLIN HFA) 108 (90 Base) MCG/ACT inhaler, Inhale 2 puffs into the lungs every 6 (six) hours as needed for wheezing or shortness of breath (cough)., Disp: 6.7 g, Rfl: 2 .  allopurinol (ZYLOPRIM) 100 MG tablet, Take 100 mg by mouth daily., Disp: , Rfl:  .  amLODipine (NORVASC) 10 MG tablet, Take 10 mg by mouth every morning. , Disp: , Rfl:  .  aspirin EC 81 MG tablet, Take 81 mg by mouth daily., Disp: , Rfl:  .  b complex-vitamin c-folic acid (NEPHRO-VITE) 0.8 MG TABS tablet, Take 1 tablet by mouth daily., Disp: , Rfl: 3 .  BRILINTA 90 MG TABS tablet, Take 90 mg by mouth 2 (two) times daily. , Disp: , Rfl:  .  calcitRIOL (ROCALTROL) 0.5 MCG capsule, Take 2 capsules (1 mcg total) by mouth daily., Disp: 30 capsule, Rfl: 0 .  Calcium Acetate 667 MG TABS, Take 1 tablet by mouth 3 (three) times daily with meals., Disp: , Rfl:  .  ferric citrate (AURYXIA) 1 GM 210 MG(Fe) tablet, Take 210 mg by mouth 3 (three) times daily with meals., Disp: , Rfl:  .  fluticasone (FLONASE) 50 MCG/ACT nasal spray, Place 1 spray  into both nostrils daily., Disp: , Rfl:  .  Fluticasone-Salmeterol (ADVAIR DISKUS) 250-50 MCG/DOSE AEPB, Inhale 1 puff into the lungs 2 (two) times daily., Disp: 60 each, Rfl: 5 .  levothyroxine (SYNTHROID) 25 MCG tablet, Take 25 mcg by mouth daily before breakfast., Disp: , Rfl:  .  omeprazole (PRILOSEC) 20 MG capsule, Take 20 mg by mouth daily., Disp: , Rfl:  .  rosuvastatin (CRESTOR) 5 MG tablet, Take 5 mg by mouth every Monday, Wednesday, and Friday., Disp: , Rfl:  .  sodium bicarbonate 325 MG tablet, Take 650 mg by mouth 2 (two) times daily., Disp: , Rfl:    Julian Hy, DO [facility] 08/18/2019 2:01 PM

## 2019-08-18 NOTE — Progress Notes (Signed)
Spoke with pt and notified of results per Dr. Carlis Abbott. Pt verbalized understanding and denied any questions.

## 2019-09-17 ENCOUNTER — Ambulatory Visit: Payer: Medicare Other | Admitting: Critical Care Medicine

## 2019-09-23 ENCOUNTER — Encounter: Payer: Self-pay | Admitting: Critical Care Medicine

## 2019-09-23 ENCOUNTER — Other Ambulatory Visit: Payer: Self-pay | Admitting: Nephrology

## 2019-09-23 ENCOUNTER — Ambulatory Visit (INDEPENDENT_AMBULATORY_CARE_PROVIDER_SITE_OTHER): Payer: 59 | Admitting: Critical Care Medicine

## 2019-09-23 ENCOUNTER — Other Ambulatory Visit: Payer: Self-pay

## 2019-09-23 ENCOUNTER — Ambulatory Visit
Admission: RE | Admit: 2019-09-23 | Discharge: 2019-09-23 | Disposition: A | Discharge status: Home / Self Care | Payer: 59 | Source: Ambulatory Visit | Attending: Nephrology | Admitting: Nephrology

## 2019-09-23 DIAGNOSIS — R05 Cough: Secondary | ICD-10-CM

## 2019-09-23 DIAGNOSIS — K219 Gastro-esophageal reflux disease without esophagitis: Secondary | ICD-10-CM

## 2019-09-23 DIAGNOSIS — R2242 Localized swelling, mass and lump, left lower limb: Secondary | ICD-10-CM

## 2019-09-23 DIAGNOSIS — R053 Chronic cough: Secondary | ICD-10-CM

## 2019-09-23 NOTE — Patient Instructions (Addendum)
Thank you for visiting Dr. Carlis Abbott at [facility]. We recommend the following:  Voice rest: minimize coughing and singing for 2-3 days as much as possible. Drink hot, soothing liquids, try to suppress cough with cough drops or cough medicine for a few days. Use your flonase for these few days to try to minimize throat irritation.    Return in about 4 weeks (around 10/21/2019). - televisit    Please do your part to reduce the spread of COVID-19.

## 2019-09-23 NOTE — Progress Notes (Signed)
Synopsis: Referred in October 2020 for chronic cough by [name], MD.   Subjective:   PATIENT ID: Phillip Blair GENDER: male DOB: 1955/09/28, MRN: [id number]  Chief Complaint  Patient presents with  . Follow-up    Patient states that his cough has got better since he switched to hemo dialysis. Cough is sometimes productive with white/brown sputum.   Virtual Visit via Telephone Note  I connected with  [name] on 09/23/19 at 11:00 AM EST by telephone and verified that I am speaking with the correct person using two identifiers.  Location: Patient: Home Provider: [facility] - [address], [place], [place], [place] [address]    I discussed the limitations, risks, security and privacy concerns of performing an evaluation and management service by telephone and the availability of in person appointments. I also discussed with the patient that there may be a patient responsible charge related to this service. The patient expressed understanding and agreed to proceed.  Patient consented to consult via telephone: Yes People present and their role in pt care: Pt   I discussed the assessment and treatment plan with the patient. The patient was provided an opportunity to ask questions and all were answered. The patient agreed with the plan and demonstrated an understanding of the instructions.   The patient was advised to call back or seek an in-person evaluation if the symptoms worsen or if the condition fails to improve as anticipated.  I provided 10 minutes of non-face-to-face time during this encounter.   Mr. Phillip Blair is a 63 year old gentleman with history of end-stage renal disease recently switched from PD to HD.  He is following up today for chronic cough.  Previous work-up including cultures and chest CT have been normal.  The switch from PD to HD has improved his cough significantly.  He only infrequently has episodes of coughing, several  hours apart rather than every few minutes.  He has continued to have a tiny amount of white or brown sputum forcing in the morning, which has been going on throughout.  He otherwise has a dry cough.  He has some nasal congestion, and has been using his Flonase less frequently last 2 weeks.  He denies sinus headaches or postnasal drip.  He has had some ongoing throat irritation, and he notices coughing more when he has been talking a lot. Overall his throat is less sore than when his cough was more frequent.      OV 08/18/2019: Mr. Phillip Blair is following up today for his chronic cough.  His nephrologist is concerned this is due to his peritoneal dialysis.  He had a tunneled dialysis catheter placed yesterday and he is going to do 13 treatments of HD over about a month to take a break from PD to determine if this improves his cough.  He has followed up with his surgeon who agreed it is possible that his cough could be related to his parathyroid surgeries in the past.  He admits that he has had some issues with swallowing, feeling as though he is about to choke since his surgery last summer.  If his cough resolves with HD, he will undergo fistula placement sometime in 2021.  Since starting the higher dose Advair he has not noticed an improvement in his cough.  He continues to take omeprazole and Flonase.  The Flonase has helped some with his congestion that he has in the morning and evening.  No fever, chills, or other  new symptoms.       OV 07/21/2019: Mr. Phillip Blair is a 63 year old gentleman with a history of polycystic kidney disease, end-stage renal disease on peritoneal dialysis, and he is being evaluated for renal transplant at [facility].  He presents for follow-up of frequent coughing.  Since he was last seen he has been using Advair twice daily (125-50), omeprazole once daily, and started Flonase daily.  His cough is mildly improved.  It is intermittent, and there are several hours throughout  the day where it does not cause problems.  His cough does not awaken him at night, but once he is awake he starts coughing.  He recently had conscious sedation for right heart catheterization, and noted that he did not cough at all.  He occasionally has thin sputum production, but not purulent sputum.  His cough has progressively worsened over time.  His heartburn has been better controlled since taking omeprazole, and this has improved his cough.  Intranasal steroids have improved his nasal congestion, but not change his cough.  He notices that he coughs more when his dialysate is instilled, and he always coughs after he drains.  He has had his seasonal flu shot this year.  He has had 3 parathyroid surgeries in the past, one most recently a few months ago.  He is following up with his surgeon soon due to having a weak voice, which he was warned may be a side effect of his surgery.  One of the parathyroid glands that was resected was close to his jugular vein.  He does not note that his cough was new immediately after any 1 of these surgeries   OV 06/30/2019: Mr. Phillip Blair is a 63 year old gentleman with a history of polycystic kidney disease on peritoneal dialysis who presents for evaluation of cough for the last 3 to 4 months.  His cough is mostly dry, occasionally productive of sputum.  His his sputum is white or brown.  He describes his coughing episodes as severe and violent, sometimes making him feel as though he is about to vomit.  These episodes can happen at irregular intervals, sometimes hours apart.  He was treated with Ladona Ridgel and an antibiotic when this started, which did not resolve his symptoms.  He has had chest x-rays, which were unrevealing.  He does not have symptoms when sleeping overnight, but does first thing in the morning and throughout the day.  He noticed some benefit when his fluid removal with dialysis was increased several weeks ago, but had no benefit when he was started on  Advair, which she has subsequently stopped.  He has a history of heartburn which resolved on omeprazole, but his cough persisted.  He is not on an ACE inhibitor and has not been for a while.  He has no history of asthma, although his son has asthma.  There is no family history of chronic lung disease.  He denies fevers, chills, sweats, unplanned weight loss, change in appetite.  He has no seasonal allergies or rhinorrhea, but occasionally complains of postnasal drip.  He has tried azelastine in the past.  He is a never smoker/vaper.  He is undergoing evaluation for kidney transplant; he is having repeat heart catheterization to address a CTO in late October.    Past Medical History:  Diagnosis Date  . Allergy   . Anemia    low iron  . Chronic kidney disease    denies  . Coronary artery disease    02/06/19: Mild  non-obstructive disease on left, 100% mid RCA with left-to-right collaterals. No PCI. [facility])  . ESRD (end stage renal disease) ([place])    s/p LUE basilic vein transpositiono 2017; s/p peritoneal dialysis catheter 12/31/17  . Family history of kidney stone   . GERD (gastroesophageal reflux disease)    occ  . Gout   . Hypercalcemia   . Hyperlipidemia   . Hypertension    does not see a cardiologist  . Parathyroid adenoma   . Polycystic kidney disease   . Renal cancer ([place])    kidney  . Sleep apnea    no longer uses cpap, has lost weight     Family History  Problem Relation Age of Onset  . Polycystic kidney disease Mother   . Blindness Father        passed from [place]  . Asthma Child   . Colon cancer Neg Hx   . Rectal cancer Neg Hx   . Stomach cancer Neg Hx   . Liver cancer Neg Hx   . Esophageal cancer Neg Hx      Past Surgical History:  Procedure Laterality Date  . AV FISTULA PLACEMENT Left 03/30/2016   Procedure: FIRST STAGE BASILIC VEIN TRANSPOSITION LEFT UPPER ARM;  Surgeon: Serafina Mitchell, MD;  Location: [place];  Service: Vascular;  Laterality: Left;  . BASCILIC VEIN  TRANSPOSITION Left 06/13/2016   Procedure: SECOND STAGE BASILIC VEIN TRANSPOSITION;  Surgeon: Serafina Mitchell, MD;  Location: [place];  Service: Vascular;  Laterality: Left;  . COLONOSCOPY    . ESOPHAGOGASTRODUODENOSCOPY (EGD) WITH PROPOFOL N/A 01/03/2018   Procedure: ESOPHAGOGASTRODUODENOSCOPY (EGD) WITH PROPOFOL;  Surgeon: Jackquline Denmark, MD;  Location: [facility] ENDOSCOPY;  Service: Endoscopy;  Laterality: N/A;  . MINIMALLY INVASIVE RADIOACTIVE PARATHYROIDECTOMY N/A 05/04/2013   Procedure: PARATHYROIDECTOMY MINIMALLY INVASIVE;  Surgeon: Harl Bowie, MD;  Location: [place];  Service: General;  Laterality: N/A;  . MINIMALLY INVASIVE RADIOACTIVE PARATHYROIDECTOMY N/A 10/20/2013   Procedure: MINIMALLY INVASIVE PARATHYROIDECTOMY CONVERTED TO COMPLETE NECK EXPLORATION;  Surgeon: Harl Bowie, MD;  Location: [place];  Service: General;  Laterality: N/A;  . NASAL SEPTOPLASTY W/ TURBINOPLASTY    . PARATHYROIDECTOMY  04/16/2019  . PARATHYROIDECTOMY N/A 04/16/2019   Procedure: NECK EXPLORATION AND PARATHYROIDECTOMY;  Surgeon: Armandina Gemma, MD;  Location: [place];  Service: General;  Laterality: N/A;  . ROBOT ASSISTED LAPAROSCOPIC NEPHRECTOMY Left 01/19/2015   Procedure: ROBOTIC ASSISTED LAPAROSCOPIC RADICAL NEPHRECTOMY AND INTRAOPERATIVE ULTRASOUND ;  Surgeon: Alexis Frock, MD;  Location: [facility];  Service: Urology;  Laterality: Left;  . THYROID LOBECTOMY Right 04/16/2019   Procedure: RIGHT THYROID LOBECTOMY;  Surgeon: Armandina Gemma, MD;  Location: [place];  Service: General;  Laterality: Right;  Marland Kitchen VASECTOMY      Social History   Socioeconomic History  . Marital status: Married    Spouse name: Not on file  . Number of children: 2  . Years of education: Not on file  . Highest education level: Not on file  Occupational History  . Occupation: customer service  Tobacco Use  . Smoking status: Never Smoker  . Smokeless tobacco: Never Used  Substance and Sexual Activity  . Alcohol use: Yes    Alcohol/week:  2.0 standard drinks    Types: 2 Glasses of wine per week    Comment: occasional  . Drug use: No  . Sexual activity: Not on file  Other Topics Concern  . Not on file  Social History Narrative   Patient is married, he has 2  children he works in Therapist, art   Occasional alcohol never smoker no drug use   Social Determinants of Radio broadcast assistant Strain:   . Difficulty of Paying Living Expenses: Not on file  Food Insecurity:   . Worried About Charity fundraiser in the Last Year: Not on file  . Ran Out of Food in the Last Year: Not on file  Transportation Needs:   . Lack of Transportation (Medical): Not on file  . Lack of Transportation (Non-Medical): Not on file  Physical Activity:   . Days of Exercise per Week: Not on file  . Minutes of Exercise per Session: Not on file  Stress:   . Feeling of Stress : Not on file  Social Connections:   . Frequency of Communication with Friends and Family: Not on file  . Frequency of Social Gatherings with Friends and Family: Not on file  . Attends Religious Services: Not on file  . Active Member of Clubs or Organizations: Not on file  . Attends Archivist Meetings: Not on file  . Marital Status: Not on file  Intimate Partner Violence:   . Fear of Current or Ex-Partner: Not on file  . Emotionally Abused: Not on file  . Physically Abused: Not on file  . Sexually Abused: Not on file     Allergies  Allergen Reactions  . Ivp Dye [Iodinated Diagnostic Agents] Itching and Other (See Comments)    SKIN PEELS  . Iodine-131 Itching and Rash     Immunization History  Administered Date(s) Administered  . Influenza Split 07/09/2011, 08/11/2012  . Influenza Whole 07/26/2010  . Influenza,inj,Quad PF,6+ Mos 06/15/2019  . Influenza-Unspecified 08/28/2013  . Pneumococcal Conjugate-13 02/12/2018  . Pneumococcal Polysaccharide-23 01/20/2015  . Tdap 08/10/2011    Outpatient Medications Prior to Visit  Medication Sig  Dispense Refill  . albuterol (VENTOLIN HFA) 108 (90 Base) MCG/ACT inhaler Inhale 2 puffs into the lungs every 6 (six) hours as needed for wheezing or shortness of breath (cough). 6.7 g 2  . allopurinol (ZYLOPRIM) 100 MG tablet Take 100 mg by mouth daily.    Marland Kitchen amLODipine (NORVASC) 10 MG tablet Take 10 mg by mouth every morning.     Marland Kitchen aspirin EC 81 MG tablet Take 81 mg by mouth daily.    Marland Kitchen b complex-vitamin c-folic acid (NEPHRO-VITE) 0.8 MG TABS tablet Take 1 tablet by mouth daily.  3  . BRILINTA 90 MG TABS tablet Take 90 mg by mouth 2 (two) times daily.     . Calcium Acetate 667 MG TABS Take 1 tablet by mouth 3 (three) times daily with meals.    . ferric citrate (AURYXIA) 1 GM 210 MG(Fe) tablet Take 210 mg by mouth 3 (three) times daily with meals.    . fluticasone (FLONASE) 50 MCG/ACT nasal spray Place 1 spray into both nostrils daily.    . Fluticasone-Salmeterol (ADVAIR DISKUS) 250-50 MCG/DOSE AEPB Inhale 1 puff into the lungs 2 (two) times daily. 60 each 5  . levothyroxine (SYNTHROID) 25 MCG tablet Take 25 mcg by mouth daily before breakfast.    . omeprazole (PRILOSEC) 20 MG capsule Take 20 mg by mouth daily.    . rosuvastatin (CRESTOR) 5 MG tablet Take 5 mg by mouth every Monday, Wednesday, and Friday.    . calcitRIOL (ROCALTROL) 0.5 MCG capsule Take 2 capsules (1 mcg total) by mouth daily. (Patient not taking: Reported on 09/23/2019) 30 capsule 0  . sodium bicarbonate 325 MG tablet Take  650 mg by mouth 2 (two) times daily.     No facility-administered medications prior to visit.    Review of Systems  Constitutional: Negative for chills and fever.  HENT: Negative for congestion.   Respiratory: Positive for cough. Negative for hemoptysis and shortness of breath.   Cardiovascular: Negative for chest pain and leg swelling.  Gastrointestinal: Negative for heartburn, nausea and vomiting.  Endo/Heme/Allergies: Negative for environmental allergies.     Objective:   There were no vitals  filed for this visit.   on  RA BMI Readings from Last 3 Encounters:  07/21/19 28.94 kg/m  06/30/19 29.57 kg/m  04/18/19 29.90 kg/m   Wt Readings from Last 3 Encounters:  07/21/19 213 lb 6.4 oz (96.8 kg)  06/30/19 218 lb (98.9 kg)  04/18/19 220 lb 7.4 oz (100 kg)    Physical Exam -limited due to telephone visit. Occasional dry-sounding cough, no hoarseness.  No conversational dyspnea.  Answering questions appropriately.   CBC    Component Value Date/Time   WBC 9.3 04/15/2019 1140   RBC 3.45 (L) 04/15/2019 1140   HGB 10.9 (L) 04/16/2019 0809   HCT 32.0 (L) 04/16/2019 0809   PLT 270 04/15/2019 1140   MCV 97.7 04/15/2019 1140   MCH 30.7 04/15/2019 1140   MCHC 31.5 04/15/2019 1140   RDW 15.2 04/15/2019 1140   LYMPHSABS 2.4 08/04/2012 0839   MONOABS 0.7 08/04/2012 0839   EOSABS 0.4 08/04/2012 0839   BASOSABS 0.0 08/04/2012 0839   Micro: 07/01/2019 respiratory culture-normal flora 07/01/2019 sputum fungus-negative 07/01/2019 sputum AFB-negative  04/13/2019 Covid negative  Chest Imaging- films reviewed: CXR 06/17/2019, reviewed- increased opacification bilateral lower lobes, especially left lower lobe adjacent to the cardiac apex.  CT abdomen 12/2017-lower lobe scarring bilaterally  CT chest 07/03/2019-mild groundglass opacities bilateral bases.  Mild airway thickening.  Patulous esophagus.  No significant mediastinal or hilar adenopathy.  Tiny centrilobular nodules throughout.  Pulmonary Functions Testing Results: No flowsheet data found.   Echocardiogram: LVEF 42%, no diastolic dysfunction, normal wall motion.  Normal RV.  Mildly dilated left atrium but normal right atrium.  Heart Catheterization August 2020: CTO RCA, able to be fully intervened upon.  Planning repeat cath in the future. 02/06/2019- nonobstructive coronary artery disease, 100% mid RCA CTO with left-to-right collaterals. Planning for repeat heart cath 07/17/2019  Assessment & Plan:     ICD-10-CM   1.  Chronic cough  R05   2. Gastroesophageal reflux disease without esophagitis  K21.9      Groundglass nodule -Follow up CT scan 6 months (April 2021).  Chronic cough-improved after switching from PD to HD.  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  Previously minimally improved on LABA-ICS, PPI, Flonase.  Not on an ACE inhibitor.   -Agree with plan to transition back to HD.  Appreciate nephrology's help. -Trial of aggressive voice rest to help with throat irritation that may be propagating cough. Voice rest: minimize coughing and singing for 2-3 days as much as possible. Drink hot, soothing liquids, try to suppress cough with cough drops or cough medicine for a few days. Use your flonase for these few days to try to minimize throat irritation.  GERD -con't PPI  Follow-up 4 weeks via televisit to ensure cough resolution.   Current Outpatient Medications:  .  albuterol (VENTOLIN HFA) 108 (90 Base) MCG/ACT inhaler, Inhale 2 puffs into the lungs every 6 (six) hours as needed for wheezing or shortness of breath (cough)., Disp: 6.7 g,  Rfl: 2 .  allopurinol (ZYLOPRIM) 100 MG tablet, Take 100 mg by mouth daily., Disp: , Rfl:  .  amLODipine (NORVASC) 10 MG tablet, Take 10 mg by mouth every morning. , Disp: , Rfl:  .  aspirin EC 81 MG tablet, Take 81 mg by mouth daily., Disp: , Rfl:  .  b complex-vitamin c-folic acid (NEPHRO-VITE) 0.8 MG TABS tablet, Take 1 tablet by mouth daily., Disp: , Rfl: 3 .  BRILINTA 90 MG TABS tablet, Take 90 mg by mouth 2 (two) times daily. , Disp: , Rfl:  .  Calcium Acetate 667 MG TABS, Take 1 tablet by mouth 3 (three) times daily with meals., Disp: , Rfl:  .  ferric citrate (AURYXIA) 1 GM 210 MG(Fe) tablet, Take 210 mg by mouth 3 (three) times daily with meals., Disp: , Rfl:  .  fluticasone (FLONASE) 50 MCG/ACT nasal spray, Place 1 spray into both nostrils daily., Disp: , Rfl:  .  Fluticasone-Salmeterol (ADVAIR DISKUS) 250-50 MCG/DOSE AEPB,  Inhale 1 puff into the lungs 2 (two) times daily., Disp: 60 each, Rfl: 5 .  levothyroxine (SYNTHROID) 25 MCG tablet, Take 25 mcg by mouth daily before breakfast., Disp: , Rfl:  .  omeprazole (PRILOSEC) 20 MG capsule, Take 20 mg by mouth daily., Disp: , Rfl:  .  rosuvastatin (CRESTOR) 5 MG tablet, Take 5 mg by mouth every Monday, Wednesday, and Friday., Disp: , Rfl:  .  calcitRIOL (ROCALTROL) 0.5 MCG capsule, Take 2 capsules (1 mcg total) by mouth daily. (Patient not taking: Reported on 09/23/2019), Disp: 30 capsule, Rfl: 0 .  sodium bicarbonate 325 MG tablet, Take 650 mg by mouth 2 (two) times daily., Disp: , Rfl:    Julian Hy, DO [facility] 09/23/2019 11:24 AM

## 2019-10-06 ENCOUNTER — Encounter: Payer: 59 | Admitting: Vascular Surgery

## 2019-10-07 ENCOUNTER — Telehealth (HOSPITAL_COMMUNITY): Payer: Self-pay

## 2019-10-07 NOTE — Telephone Encounter (Signed)

## 2019-10-08 ENCOUNTER — Encounter: Payer: Self-pay | Admitting: Vascular Surgery

## 2019-10-08 ENCOUNTER — Other Ambulatory Visit: Payer: Self-pay

## 2019-10-08 ENCOUNTER — Ambulatory Visit (INDEPENDENT_AMBULATORY_CARE_PROVIDER_SITE_OTHER): Payer: Medicare Other | Admitting: Vascular Surgery

## 2019-10-08 VITALS — BP 127/79 | HR 87 | Temp 97.4°F | Resp 18 | Ht 72.0 in | Wt 206.0 lb

## 2019-10-08 DIAGNOSIS — Z992 Dependence on renal dialysis: Secondary | ICD-10-CM

## 2019-10-08 DIAGNOSIS — N186 End stage renal disease: Secondary | ICD-10-CM

## 2019-10-08 NOTE — Progress Notes (Signed)
REASON FOR CONSULT:    "Second stage basilic vein transposition".  The consult is requested by Dr. [name].   ASSESSMENT & PLAN:   END-STAGE RENAL DISEASE: This patient has end-stage renal disease and has now switched to hemodialysis from peritoneal dialysis.  He had a second stage basilic vein transposition by Dr. Trula Slade on 06/13/2016.  They now want to begin using his fistula however it is in a difficult location as it is fairly far medial in the left upper arm.  I think it would be reasonable to try to transpose this where it is more accessible.  I think possibly we could position the anastomosis further centrally allowing for some mobilization of the vein so that it can be tunneled in a more accessible location.  He dialyzes on Tuesdays Thursdays and Saturdays and we will have to arrange this on a Monday Wednesday or Friday.  He does have a functioning right IJ tunneled dialysis catheter.  I have reviewed the indications for the procedure and the potential complications and he is agreeable to proceed.  Deitra Mayo, MD Office: [phone]   HPI:   Phillip Blair is a pleasant 64 y.o. male, with end-stage renal disease.  He had been on peritoneal dialysis for many years but now is switching to hemodialysis.  He had a right IJ tunneled dialysis catheter placed in October.  He dialyzes on Tuesdays Thursdays and Saturdays.  His PD catheter is scheduled to be removed on 11/03/2019.  In reviewing his records he had a second stage basilic vein transposition by Dr. Trula Slade on 06/13/2016.  He denies any recent uremic symptoms.  Past Medical History:  Diagnosis Date  . Allergy   . Anemia    low iron  . Chronic kidney disease    denies  . Coronary artery disease    02/06/19: Mild non-obstructive disease on left, 100% mid RCA with left-to-right collaterals. No PCI. [facility])  . ESRD (end stage renal disease) ([place])    s/p LUE basilic vein transpositiono 2017; s/p peritoneal dialysis  catheter 12/31/17  . Family history of kidney stone   . GERD (gastroesophageal reflux disease)    occ  . Gout   . Hypercalcemia   . Hyperlipidemia   . Hypertension    does not see a cardiologist  . Parathyroid adenoma   . Polycystic kidney disease   . Renal cancer ([place])    kidney  . Sleep apnea    no longer uses cpap, has lost weight    Family History  Problem Relation Age of Onset  . Polycystic kidney disease Mother   . Blindness Father        passed from [place]  . Asthma Child   . Colon cancer Neg Hx   . Rectal cancer Neg Hx   . Stomach cancer Neg Hx   . Liver cancer Neg Hx   . Esophageal cancer Neg Hx     SOCIAL HISTORY: Social History   Socioeconomic History  . Marital status: Married    Spouse name: Not on file  . Number of children: 2  . Years of education: Not on file  . Highest education level: Not on file  Occupational History  . Occupation: customer service  Tobacco Use  . Smoking status: Never Smoker  . Smokeless tobacco: Never Used  Substance and Sexual Activity  . Alcohol use: Yes    Alcohol/week: 2.0 standard drinks    Types: 2 Glasses of wine per week    Comment:  occasional  . Drug use: No  . Sexual activity: Not on file  Other Topics Concern  . Not on file  Social History Narrative   Patient is married, he has 2 children he works in Therapist, art   Occasional alcohol never smoker no drug use   Social Determinants of Radio broadcast assistant Strain:   . Difficulty of Paying Living Expenses: Not on file  Food Insecurity:   . Worried About Charity fundraiser in the Last Year: Not on file  . Ran Out of Food in the Last Year: Not on file  Transportation Needs:   . Lack of Transportation (Medical): Not on file  . Lack of Transportation (Non-Medical): Not on file  Physical Activity:   . Days of Exercise per Week: Not on file  . Minutes of Exercise per Session: Not on file  Stress:   . Feeling of Stress : Not on file  Social  Connections:   . Frequency of Communication with Friends and Family: Not on file  . Frequency of Social Gatherings with Friends and Family: Not on file  . Attends Religious Services: Not on file  . Active Member of Clubs or Organizations: Not on file  . Attends Archivist Meetings: Not on file  . Marital Status: Not on file  Intimate Partner Violence:   . Fear of Current or Ex-Partner: Not on file  . Emotionally Abused: Not on file  . Physically Abused: Not on file  . Sexually Abused: Not on file    Allergies  Allergen Reactions  . Ivp Dye [Iodinated Diagnostic Agents] Itching and Other (See Comments)    SKIN PEELS  . Other Itching  . Iodine-131 Itching and Rash    Current Outpatient Medications  Medication Sig Dispense Refill  . albuterol (VENTOLIN HFA) 108 (90 Base) MCG/ACT inhaler Inhale 2 puffs into the lungs every 6 (six) hours as needed for wheezing or shortness of breath (cough). 6.7 g 2  . allopurinol (ZYLOPRIM) 100 MG tablet Take 100 mg by mouth daily.    Marland Kitchen amLODipine (NORVASC) 10 MG tablet Take 10 mg by mouth every morning.     Marland Kitchen aspirin EC 81 MG tablet Take 81 mg by mouth daily.    Marland Kitchen b complex-vitamin c-folic acid (NEPHRO-VITE) 0.8 MG TABS tablet Take 1 tablet by mouth daily.  3  . BRILINTA 90 MG TABS tablet Take 90 mg by mouth 2 (two) times daily.     . ferric citrate (AURYXIA) 1 GM 210 MG(Fe) tablet Take 210 mg by mouth 3 (three) times daily with meals.    . fluticasone (FLONASE) 50 MCG/ACT nasal spray Place 1 spray into both nostrils daily.    . Fluticasone-Salmeterol (ADVAIR DISKUS) 250-50 MCG/DOSE AEPB Inhale 1 puff into the lungs 2 (two) times daily. 60 each 5  . levothyroxine (SYNTHROID) 25 MCG tablet Take 25 mcg by mouth daily before breakfast.    . omeprazole (PRILOSEC) 20 MG capsule Take 20 mg by mouth daily.    . rosuvastatin (CRESTOR) 5 MG tablet Take 5 mg by mouth every Monday, Wednesday, and Friday.    . sodium bicarbonate 325 MG tablet Take 650  mg by mouth 2 (two) times daily.    . calcitRIOL (ROCALTROL) 0.5 MCG capsule Take 2 capsules (1 mcg total) by mouth daily. (Patient not taking: Reported on 10/08/2019) 30 capsule 0  . Calcium Acetate 667 MG TABS Take 1 tablet by mouth 3 (three) times daily with meals.  No current facility-administered medications for this visit.    REVIEW OF SYSTEMS:  [X]  denotes positive finding, [ ]  denotes negative finding Cardiac  Comments:  Chest pain or chest pressure:    Shortness of breath upon exertion:    Short of breath when lying flat:    Irregular heart rhythm:        Vascular    Pain in calf, thigh, or hip brought on by ambulation:    Pain in feet at night that wakes you up from your sleep:     Blood clot in your veins:    Leg swelling:         Pulmonary    Oxygen at home:    Productive cough:     Wheezing:         Neurologic    Sudden weakness in arms or legs:     Sudden numbness in arms or legs:     Sudden onset of difficulty speaking or slurred speech:    Temporary loss of vision in one eye:     Problems with dizziness:         Gastrointestinal    Blood in stool:     Vomited blood:         Genitourinary    Burning when urinating:     Blood in urine:        Psychiatric    Major depression:         Hematologic    Bleeding problems:    Problems with blood clotting too easily:        Skin    Rashes or ulcers:        Constitutional    Fever or chills:     PHYSICAL EXAM:   Vitals:   10/08/19 0852  BP: 127/79  Pulse: 87  Resp: 18  Temp: (!) 97.4 F (36.3 C)  TempSrc: Temporal  SpO2: 99%  Weight: 206 lb (93.4 kg)  Height: 6' (1.829 m)    GENERAL: The patient is a well-nourished male, in no acute distress. The vital signs are documented above. CARDIAC: There is a regular rate and rhythm.  VASCULAR: He has an excellent thrill in his left basilic vein transposition.  The fistula is position on the medial aspect of the left upper arm.  He has a diminished  but palpable left radial pulse. PULMONARY: There is good air exchange bilaterally without wheezing or rales. ABDOMEN: Soft and non-tender with normal pitched bowel sounds.  MUSCULOSKELETAL: There are no major deformities or cyanosis. NEUROLOGIC: No focal weakness or paresthesias are detected. SKIN: There are no ulcers or rashes noted. PSYCHIATRIC: The patient has a normal affect.  DATA:    No new data.

## 2019-10-14 ENCOUNTER — Ambulatory Visit (HOSPITAL_COMMUNITY)
Admission: RE | Admit: 2019-10-14 | Discharge: 2019-10-14 | Disposition: A | Discharge status: Home / Self Care | Payer: Medicare Other | Source: Ambulatory Visit | Attending: Urology | Admitting: Urology

## 2019-10-14 ENCOUNTER — Other Ambulatory Visit (HOSPITAL_COMMUNITY): Payer: Self-pay | Admitting: Urology

## 2019-10-14 ENCOUNTER — Other Ambulatory Visit: Payer: Self-pay

## 2019-10-14 DIAGNOSIS — C642 Malignant neoplasm of left kidney, except renal pelvis: Secondary | ICD-10-CM | POA: Insufficient documentation

## 2019-10-19 ENCOUNTER — Other Ambulatory Visit (HOSPITAL_COMMUNITY)
Admission: RE | Admit: 2019-10-19 | Discharge: 2019-10-19 | Disposition: A | Discharge status: Home / Self Care | Payer: Medicare Other | Source: Ambulatory Visit | Attending: Surgery | Admitting: Surgery

## 2019-10-19 ENCOUNTER — Other Ambulatory Visit (HOSPITAL_COMMUNITY): Payer: Medicare Other

## 2019-10-19 ENCOUNTER — Encounter: Payer: 59 | Admitting: Surgery

## 2019-10-19 ENCOUNTER — Encounter (HOSPITAL_COMMUNITY): Payer: Self-pay | Admitting: Surgery

## 2019-10-19 ENCOUNTER — Other Ambulatory Visit: Payer: Self-pay

## 2019-10-19 DIAGNOSIS — Z01812 Encounter for preprocedural laboratory examination: Secondary | ICD-10-CM | POA: Insufficient documentation

## 2019-10-19 DIAGNOSIS — Z20822 Contact with and (suspected) exposure to covid-19: Secondary | ICD-10-CM | POA: Diagnosis not present

## 2019-10-19 LAB — SARS CORONAVIRUS 2 (TAT 6-24 HRS): SARS Coronavirus 2: NEGATIVE

## 2019-10-19 NOTE — Progress Notes (Signed)
Pt denies SOB and chest pain. Pt stated that he is under the care of Dr. Ferdinand Lango, Cardiology and Dr. Ulanda Edison, PCP. Requested EKG tracing from [facility]; awaiting response. Pt stated that last dose of Brilinta was 10/15/19 as instructed. Pt made aware to stop taking vitamins, fish oil and herbal medications. Do not take any NSAIDs ie: Ibuprofen, Advil, Naproxen (Aleve), Motrin, BC and Goody Powder. Pt reminded to quarantine. Pt verbalized undertstanding of all pre-op instructions. PA, Anesthesiology, asked to review pt history.

## 2019-10-20 ENCOUNTER — Encounter (HOSPITAL_COMMUNITY): Payer: Self-pay | Admitting: Vascular Surgery

## 2019-10-20 ENCOUNTER — Encounter (HOSPITAL_COMMUNITY): Payer: Self-pay | Admitting: Surgery

## 2019-10-20 NOTE — Progress Notes (Signed)
Anesthesia Chart Review: Phillip Blair   Case: [id number] Date/Time: 10/21/19 1255   Procedure: BASCILIC VEIN TRANSPOSITION (Left )   Anesthesia type: Monitor Anesthesia Care   Pre-op diagnosis: END STAGE RENAL DISEASE   Location: [facility] OR ROOM 35 / [place] OR   Surgeons: Serafina Mitchell, MD      DISCUSSION: Patient is a 64 year old male scheduled for the above procedure. Notes indicate that he does have a functioning right IJ tunneled dialysis catheter.  History includes never smoker, HTN, HLD, CAD (occluded RCA with collaterals 01/2019; s/p DES proximal RCA with concern for dissection, stented through mid RCA with good angiographic result 07/17/19), OSA (no CPAP since weight loss), clear cell renal carcinoma/polycystic kidney disease (s/p robotic assisted laparoscopic left radical nephrectomy 01/20/15), ESRD (left basilic vein transposition 2017; s/p peritoneal dialysis catheter 12/31/17 but now prefers hemodialysis and is scheduled for PD catheter removal 11/03/19; on HD TTS), parathyroid adenoma/hypercalcemia (s/p parathyroidectomy 05/04/2013, 10/20/2013; s/p neck exploration, excision of right parathyroid adenoma, right thyroid lobectomy 04/16/19), GERD, anemia, GI bleed with ulcerative esophagitis (12/2017), hypothyroidism.  He had DES to RCA on 07/17/19. DAPT recommended for six months. Patient reported last dose Brilinta 10/15/19. He is still on ASA. Since he is still < 6 months out for DES, I reached out to [facility] staff regarding touching base with cardiology if okay to temporarily hold Brilinta for this procedure or if will need to resume.   10/19/19 presurgical COVID-19 test was negative.    PROVIDERS: [name], MD is PCP - Barbaraann Boys, MD is cardiologist ([place])  - Donato Heinz, MD is nephrologist - Alexis Frock, MD is urologist [facility])   LABS: He is for ISTAT labs prior to surgery.    IMAGES: CXR 10/14/19: FINDINGS: The heart size and  mediastinal contours are within normal limits. Surgical clips about the lower neck. Right chest large bore multi lumen vascular catheter. Both lungs are clear. The visualized skeletal structures are unremarkable. IMPRESSION: No acute abnormality of the lungs. No radiographic evidence of pulmonary metastatic disease.    EKG: 07/19/19 [facility]): Tracing requested, but according to [facility] Care Everywhere Result Narrative:  Ventricular Rate          67    BPM          Atrial Rate            67    BPM          P-R Interval            192    ms          QRS Duration            92    ms          Q-T Interval            428    ms          QTC                452    ms          P Axis               55    degrees        R Axis               35    degrees        T Axis  41    degrees        Sinus rhythm Normal ECG When compared with ECG of 17-Jul-2019 09:40, No significant change was found Confirmed by Larae Grooms ([id number]) on 07/19/2019 3:39:40 PM    CV: LLE venous US 09/23/19: IMPRESSION: 1. No evidence of DVT within the left lower extremity. 2. Indeterminate approximately 5.2 cm complex fluid collection with the subcutaneous tissues of the left calf - nonspecific with differential considerations including leaking Baker's cyst versus hematoma, less likely, seroma or abscess. Clinical correlation is advised.   PCI 07/17/19 [facility]): Planned PCI of CTO RCA. Bilateral femoral access (8 Fr destination in RFA and 8 Fr long sheath in  LFA).  8 Fr AL 0.75 in RCA and 8 Fr EBU 3.5 in LMCA Started with antegrade wire escalation strategy using a Corsair delivered  with a BMW then Guia 2 Able to wire true to true, swapped back to a BMW to wire into PDA.  Stented  Proximally with a 2.5x12 Synergy DES (concern for proximal guide  dissection).  Stented through the mid with a 2.5x38 post dilated everything with a 2.75  Bogard to high pressures.  Good angiographic result.  DAPT with ASA and ticag ("dual antiplatelet therapy without interruption for 6 Months for the Drug Eluting Stent. ASA/ticagrelor" per 07/17/19 discharge summary)    Nuclear stress test 02/25/19 [facility]): IMPRESSION: 1. Decreased uptake along the inferior wall on the stress images. Findings are equivocal for reversibility. Inferior wall findings could be related to diaphragmatic attenuation but uncertain. 2. Normal left ventricular wall motion. 3. Left ventricular ejection fraction is 71%. 4. Non invasive risk stratification*: Low 2012 Appropriate Use Criteria for Coronary Revascularization Focused Update: J Am Coll Cardiol. 3329;51(8):841-660. http://content.airportbarriers.com.aspx?articleid=1201161   Cardiac cath 02/06/19 [facility]): Result Narrative Non-obstructive coronary artery disease. LCP for positive stress test (LV dilation post stress) as part of renal  transplant workup. Of note, patient not having chest pain or dyspnea on  exertion.  Right radial artery  Mild non obstructive disease on left side.  100% mid RCA with left to right collaterals.  No indication for PCI at this time.    Echo 11/27/18 [facility]): SUMMARY The left ventricular size is normal. Mild left ventricular hypertrophy Left ventricular systolic function is normal. LV ejection fraction = 60-65%. Left ventricular filling pattern is prolonged relaxation. The right ventricle is normal size. The right ventricular systolic function is normal. The left atrium is mildly dilated. There is mild mitral regurgitation. No significant stenosis seen There was insufficient TR detected to calculate RV systolic pressure. Estimated right atrial pressure is 5 mmHg.Marland Kitchen There is no pericardial  effusion. Probably no significant change in comparison with the prior study noted   Carotid Duplex 11/27/18 [facility]; scanned under Media tab, Correspondence, 04/16/19):  Impression: Right: Less than 21% diameter reduction of the right proximal ICA. No evidence of hemodynamically significant ICA stenosis. The right vertebral artery flow is antegrade. Left: Less than 21% diameter reduction of the left proximal ICA. No evidence of hemodynamically significant ICA stenosis. The left vertebral artery flow is antegrade.   Past Medical History:  Diagnosis Date  . Allergy   . Anemia    low iron  . Chronic kidney disease    denies  . Coronary artery disease    02/06/19: Mild non-obstructive disease on left, 100% mid RCA with left-to-right collaterals. No PCI. S/p attempted RCA PCI 04/2019. S/p DES pRCA with concern for dissection, stented through mRCA with good result 07/17/19.  DAPT x 6 months rec. [facility])  . ESRD (end stage renal disease) ([place])    s/p LUE basilic vein transpositiono 2017; s/p peritoneal dialysis catheter 12/31/17  . Family history of kidney stone   . GERD (gastroesophageal reflux disease)    occ  . Gout   . Hypercalcemia   . Hyperlipidemia   . Hypertension    does not see a cardiologist  . Hypothyroidism   . Parathyroid adenoma   . Polycystic kidney disease   . Renal cancer ([place])    kidney  . Sleep apnea    no longer uses cpap, has lost weight  . Wears glasses     Past Surgical History:  Procedure Laterality Date  . AV FISTULA PLACEMENT Left 03/30/2016   Procedure: FIRST STAGE BASILIC VEIN TRANSPOSITION LEFT UPPER ARM;  Surgeon: Serafina Mitchell, MD;  Location: [place];  Service: Vascular;  Laterality: Left;  . BASCILIC VEIN TRANSPOSITION Left 06/13/2016   Procedure: SECOND STAGE BASILIC VEIN TRANSPOSITION;  Surgeon: Serafina Mitchell, MD;  Location: [place];  Service: Vascular;  Laterality: Left;  . COLONOSCOPY    . ESOPHAGOGASTRODUODENOSCOPY (EGD) WITH PROPOFOL N/A 01/03/2018    Procedure: ESOPHAGOGASTRODUODENOSCOPY (EGD) WITH PROPOFOL;  Surgeon: Jackquline Denmark, MD;  Location: [facility] ENDOSCOPY;  Service: Endoscopy;  Laterality: N/A;  . MINIMALLY INVASIVE RADIOACTIVE PARATHYROIDECTOMY N/A 05/04/2013   Procedure: PARATHYROIDECTOMY MINIMALLY INVASIVE;  Surgeon: Harl Bowie, MD;  Location: [place];  Service: General;  Laterality: N/A;  . MINIMALLY INVASIVE RADIOACTIVE PARATHYROIDECTOMY N/A 10/20/2013   Procedure: MINIMALLY INVASIVE PARATHYROIDECTOMY CONVERTED TO COMPLETE NECK EXPLORATION;  Surgeon: Harl Bowie, MD;  Location: [place];  Service: General;  Laterality: N/A;  . NASAL SEPTOPLASTY W/ TURBINOPLASTY    . PARATHYROIDECTOMY  04/16/2019  . PARATHYROIDECTOMY N/A 04/16/2019   Procedure: NECK EXPLORATION AND PARATHYROIDECTOMY;  Surgeon: Armandina Gemma, MD;  Location: [place];  Service: General;  Laterality: N/A;  . ROBOT ASSISTED LAPAROSCOPIC NEPHRECTOMY Left 01/19/2015   Procedure: ROBOTIC ASSISTED LAPAROSCOPIC RADICAL NEPHRECTOMY AND INTRAOPERATIVE ULTRASOUND ;  Surgeon: Alexis Frock, MD;  Location: [facility];  Service: Urology;  Laterality: Left;  . THYROID LOBECTOMY Right 04/16/2019   Procedure: RIGHT THYROID LOBECTOMY;  Surgeon: Armandina Gemma, MD;  Location: [place];  Service: General;  Laterality: Right;  Marland Kitchen VASECTOMY      MEDICATIONS: No current facility-administered medications for this encounter.   Marland Kitchen albuterol (VENTOLIN HFA) 108 (90 Base) MCG/ACT inhaler  . allopurinol (ZYLOPRIM) 100 MG tablet  . amLODipine (NORVASC) 10 MG tablet  . aspirin EC 81 MG tablet  . b complex-vitamin c-folic acid (NEPHRO-VITE) 0.8 MG TABS tablet  . BRILINTA 90 MG TABS tablet  . ferric citrate (AURYXIA) 1 GM 210 MG(Fe) tablet  . fluticasone (FLONASE) 50 MCG/ACT nasal spray  . gentamicin cream (GARAMYCIN) 0.1 %  . levothyroxine (SYNTHROID) 50 MCG tablet  . omeprazole (PRILOSEC) 20 MG capsule  . rosuvastatin (CRESTOR) 5 MG tablet  . calcitRIOL (ROCALTROL) 0.5 MCG capsule  .  Fluticasone-Salmeterol (ADVAIR DISKUS) 250-50 MCG/DOSE AEPB    Myra Gianotti, PA-C Surgical Short Stay/Anesthesiology [facility] Phone [phone] [facility] Phone [phone] 10/20/2019 12:51 PM

## 2019-10-21 ENCOUNTER — Ambulatory Visit (HOSPITAL_COMMUNITY): Admission: RE | Admit: 2019-10-21 | Payer: Medicare Other | Source: Ambulatory Visit | Admitting: Surgery

## 2019-10-21 ENCOUNTER — Ambulatory Visit: Payer: 59 | Admitting: Critical Care Medicine

## 2019-10-21 HISTORY — DX: Presence of spectacles and contact lenses: Z97.3

## 2019-10-21 HISTORY — DX: Hypothyroidism, unspecified: E03.9

## 2019-10-21 SURGERY — TRANSPOSITION, VEIN, BASILIC
Anesthesia: Monitor Anesthesia Care | Laterality: Left

## 2019-11-11 ENCOUNTER — Ambulatory Visit (INDEPENDENT_AMBULATORY_CARE_PROVIDER_SITE_OTHER): Payer: Medicare Other | Admitting: Critical Care Medicine

## 2019-11-11 ENCOUNTER — Ambulatory Visit: Payer: Medicare Other | Admitting: Critical Care Medicine

## 2019-11-11 ENCOUNTER — Encounter: Payer: Self-pay | Admitting: Critical Care Medicine

## 2019-11-11 ENCOUNTER — Other Ambulatory Visit: Payer: Self-pay

## 2019-11-11 DIAGNOSIS — R05 Cough: Secondary | ICD-10-CM

## 2019-11-11 DIAGNOSIS — R0982 Postnasal drip: Secondary | ICD-10-CM

## 2019-11-11 DIAGNOSIS — R053 Chronic cough: Secondary | ICD-10-CM

## 2019-11-11 NOTE — Patient Instructions (Addendum)
Thank you for visiting Dr. Carlis Abbott at [facility]. We recommend the following:  I will follow up with you after your CT scan in April.  Continue Flonase daily.   Please do your part to reduce the spread of COVID-19.

## 2019-11-11 NOTE — Progress Notes (Signed)
Synopsis: Referred in October 2020 for chronic cough by [name], MD.   Subjective:   PATIENT ID: Phillip Blair GENDER: male DOB: 1956-09-01, MRN: [id number]  No chief complaint on file.   Virtual Visit via Telephone Note  I connected with Phillip Blair on 11/11/19 at  9:00 AM EST by telephone and verified that I am speaking with the correct person using two identifiers.  Location: Patient: home Provider: [facility]- [phone] [place], [place]   I discussed the limitations, risks, security and privacy concerns of performing an evaluation and management service by telephone and the availability of in person appointments. I also discussed with the patient that there may be a patient responsible charge related to this service. The patient expressed understanding and agreed to proceed.   I discussed the assessment and treatment plan with the patient. The patient was provided an opportunity to ask questions and all were answered. The patient agreed with the plan and demonstrated an understanding of the instructions.   The patient was advised to call back or seek an in-person evaluation if the symptoms worsen or if the condition fails to improve as anticipated.  I provided 15 minutes of non-face-to-face time during this encounter.    Phillip Blair is a 64 y/o gentleman with a history of PCKD, ESRD on HD, CAD s/p DES in 06/2019 who is following up for chronic cough. This has significantly improved with switching from peritoneal dialysis back to hemodialysis.  In April he has a surgery planned to have his dialysis graft moved in his arm and his PD access removed.  He says an occasional cough, but overall it is significantly improved.  His cough is mostly associated with sinus drainage.  He continues on Flonase.  He was previously on Advair, with no benefit.  He is no longer taking this medication.  He received his first Covid vaccine and is scheduled for second in  March.     Televisit 12/30: Phillip Blair is a 64 year old gentleman with history of end-stage renal disease recently switched from PD to HD.  He is following up today for chronic cough.  Previous work-up including cultures and chest CT have been normal.  The switch from PD to HD has improved his cough significantly.  He only infrequently has episodes of coughing, several hours apart rather than every few minutes.  He has continued to have a tiny amount of white or brown sputum forcing in the morning, which has been going on throughout.  He otherwise has a dry cough.  He has some nasal congestion, and has been using his Flonase less frequently last 2 weeks.  He denies sinus headaches or postnasal drip.  He has had some ongoing throat irritation, and he notices coughing more when he has been talking a lot. Overall his throat is less sore than when his cough was more frequent.     OV 08/18/2019: Phillip Blair is following up today for his chronic cough.  His nephrologist is concerned this is due to his peritoneal dialysis.  He had a tunneled dialysis catheter placed yesterday and he is going to do 13 treatments of HD over about a month to take a break from PD to determine if this improves his cough.  He has followed up with his surgeon who agreed it is possible that his cough could be related to his parathyroid surgeries in the past.  He admits that he has had some issues with swallowing, feeling as  though he is about to choke since his surgery last summer.  If his cough resolves with HD, he will undergo fistula placement sometime in 2021.  Since starting the higher dose Advair he has not noticed an improvement in his cough.  He continues to take omeprazole and Flonase.  The Flonase has helped some with his congestion that he has in the morning and evening.  No fever, chills, or other new symptoms.       OV 07/21/2019: Phillip Blair is a 64 year old gentleman with a history of polycystic kidney disease,  end-stage renal disease on peritoneal dialysis, and he is being evaluated for renal transplant at [facility].  He presents for follow-up of frequent coughing.  Since he was last seen he has been using Advair twice daily (125-50), omeprazole once daily, and started Flonase daily.  His cough is mildly improved.  It is intermittent, and there are several hours throughout the day where it does not cause problems.  His cough does not awaken him at night, but once he is awake he starts coughing.  He recently had conscious sedation for right heart catheterization, and noted that he did not cough at all.  He occasionally has thin sputum production, but not purulent sputum.  His cough has progressively worsened over time.  His heartburn has been better controlled since taking omeprazole, and this has improved his cough.  Intranasal steroids have improved his nasal congestion, but not change his cough.  He notices that he coughs more when his dialysate is instilled, and he always coughs after he drains.  He has had his seasonal flu shot this year.  He has had 3 parathyroid surgeries in the past, one most recently a few months ago.  He is following up with his surgeon soon due to having a weak voice, which he was warned may be a side effect of his surgery.  One of the parathyroid glands that was resected was close to his jugular vein.  He does not note that his cough was new immediately after any 1 of these surgeries  OV 06/30/2019: Phillip Blair is a 64 year old gentleman with a history of polycystic kidney disease on peritoneal dialysis who presents for evaluation of cough for the last 3 to 4 months.  His cough is mostly dry, occasionally productive of sputum.  His his sputum is white or brown.  He describes his coughing episodes as severe and violent, sometimes making him feel as though he is about to vomit.  These episodes can happen at irregular intervals, sometimes hours apart.  He was treated with Ladona Ridgel and an antibiotic when this started, which did not resolve his symptoms.  He has had chest x-rays, which were unrevealing.  He does not have symptoms when sleeping overnight, but does first thing in the morning and throughout the day.  He noticed some benefit when his fluid removal with dialysis was increased several weeks ago, but had no benefit when he was started on Advair, which she has subsequently stopped.  He has a history of heartburn which resolved on omeprazole, but his cough persisted.  He is not on an ACE inhibitor and has not been for a while.  He has no history of asthma, although his son has asthma.  There is no family history of chronic lung disease.  He denies fevers, chills, sweats, unplanned weight loss, change in appetite.  He has no seasonal allergies or rhinorrhea, but occasionally complains of postnasal drip.  He has  tried azelastine in the past.  He is a never smoker/vaper.  He is undergoing evaluation for kidney transplant; he is having repeat heart catheterization to address a CTO in late October.    Past Medical History:  Diagnosis Date  . Allergy   . Anemia    low iron  . Chronic kidney disease    denies  . Coronary artery disease    02/06/19: Mild non-obstructive disease on left, 100% mid RCA with left-to-right collaterals. No PCI. S/p attempted RCA PCI 04/2019. S/p DES pRCA with concern for dissection, stented through mRCA with good result 07/17/19. [facility]APT x 6 months [facility])  . ESRD (end stage renal disease) ([place])    s/p LUE basilic vein transpositiono 2017; s/p peritoneal dialysis catheter 12/31/17  . Family history of kidney stone   . GERD (gastroesophageal reflux disease)    occ  . Gout   . Hypercalcemia   . Hyperlipidemia   . Hypertension    does not see a cardiologist  . Hypothyroidism   . Parathyroid adenoma   . Polycystic kidney disease   . Renal cancer ([place])    kidney  . Sleep apnea    no longer uses cpap, has lost weight  . Wears glasses        Family History  Problem Relation Age of Onset  . Polycystic kidney disease Mother   . Blindness Father        passed from [place]  . Asthma Child   . Colon cancer Neg Hx   . Rectal cancer Neg Hx   . Stomach cancer Neg Hx   . Liver cancer Neg Hx   . Esophageal cancer Neg Hx      Past Surgical History:  Procedure Laterality Date  . AV FISTULA PLACEMENT Left 03/30/2016   Procedure: FIRST STAGE BASILIC VEIN TRANSPOSITION LEFT UPPER ARM;  Surgeon: Serafina Mitchell, MD;  Location: [facility];  Service: Vascular;  Laterality: Left;  . BASCILIC VEIN TRANSPOSITION Left 06/13/2016   Procedure: SECOND STAGE BASILIC VEIN TRANSPOSITION;  Surgeon: Serafina Mitchell, MD;  Location: [facility];  Service: Vascular;  Laterality: Left;  . COLONOSCOPY    . ESOPHAGOGASTRODUODENOSCOPY (EGD) WITH PROPOFOL N/A 01/03/2018   Procedure: ESOPHAGOGASTRODUODENOSCOPY (EGD) WITH PROPOFOL;  Surgeon: Jackquline Denmark, MD;  Location: [facility] ENDOSCOPY;  Service: Endoscopy;  Laterality: N/A;  . MINIMALLY INVASIVE RADIOACTIVE PARATHYROIDECTOMY N/A 05/04/2013   Procedure: PARATHYROIDECTOMY MINIMALLY INVASIVE;  Surgeon: Harl Bowie, MD;  Location: [place];  Service: General;  Laterality: N/A;  . MINIMALLY INVASIVE RADIOACTIVE PARATHYROIDECTOMY N/A 10/20/2013   Procedure: MINIMALLY INVASIVE PARATHYROIDECTOMY CONVERTED TO COMPLETE NECK EXPLORATION;  Surgeon: Harl Bowie, MD;  Location: [place];  Service: General;  Laterality: N/A;  . NASAL SEPTOPLASTY W/ TURBINOPLASTY    . PARATHYROIDECTOMY  04/16/2019  . PARATHYROIDECTOMY N/A 04/16/2019   Procedure: NECK EXPLORATION AND PARATHYROIDECTOMY;  Surgeon: Armandina Gemma, MD;  Location: [facility];  Service: General;  Laterality: N/A;  . ROBOT ASSISTED LAPAROSCOPIC NEPHRECTOMY Left 01/19/2015   Procedure: ROBOTIC ASSISTED LAPAROSCOPIC RADICAL NEPHRECTOMY AND INTRAOPERATIVE ULTRASOUND ;  Surgeon: Alexis Frock, MD;  Location: [facility];  Service: Urology;  Laterality: Left;  . THYROID LOBECTOMY Right  04/16/2019   Procedure: RIGHT THYROID LOBECTOMY;  Surgeon: Armandina Gemma, MD;  Location: [facility];  Service: General;  Laterality: Right;  Marland Kitchen VASECTOMY      Social History   Socioeconomic History  . Marital status: Married    Spouse name: Not on file  . Number of  children: 2  . Years of education: Not on file  . Highest education level: Not on file  Occupational History  . Occupation: customer service  Tobacco Use  . Smoking status: Never Smoker  . Smokeless tobacco: Never Used  Substance and Sexual Activity  . Alcohol use: Yes    Alcohol/week: 2.0 standard drinks    Types: 2 Glasses of wine per week    Comment: occasional  . Drug use: No  . Sexual activity: Not on file  Other Topics Concern  . Not on file  Social History Narrative   Patient is married, he has 2 children he works in Therapist, art   Occasional alcohol never smoker no drug use   Social Determinants of Radio broadcast assistant Strain:   . Difficulty of Paying Living Expenses: Not on file  Food Insecurity:   . Worried About Charity fundraiser in the Last Year: Not on file  . Ran Out of Food in the Last Year: Not on file  Transportation Needs:   . Lack of Transportation (Medical): Not on file  . Lack of Transportation (Non-Medical): Not on file  Physical Activity:   . Days of Exercise per Week: Not on file  . Minutes of Exercise per Session: Not on file  Stress:   . Feeling of Stress : Not on file  Social Connections:   . Frequency of Communication with Friends and Family: Not on file  . Frequency of Social Gatherings with Friends and Family: Not on file  . Attends Religious Services: Not on file  . Active Member of Clubs or Organizations: Not on file  . Attends Archivist Meetings: Not on file  . Marital Status: Not on file  Intimate Partner Violence:   . Fear of Current or Ex-Partner: Not on file  . Emotionally Abused: Not on file  . Physically Abused: Not on file  . Sexually Abused:  Not on file     Allergies  Allergen Reactions  . Ivp Dye [Iodinated Diagnostic Agents] Itching and Other (See Comments)    SKIN PEELS  . Other Itching  . Iodine-131 Itching and Rash     Immunization History  Administered Date(s) Administered  . Hepatitis B, adult 10/01/2019, 10/29/2019  . Influenza Split 07/09/2011, 08/11/2012  . Influenza Whole 07/26/2010  . Influenza,inj,Quad PF,6+ Mos 06/15/2019  . Influenza-Unspecified 08/28/2013  . Pneumococcal Conjugate-13 02/12/2018  . Pneumococcal Polysaccharide-23 01/20/2015  . Tdap 08/10/2011    Outpatient Medications Prior to Visit  Medication Sig Dispense Refill  . albuterol (VENTOLIN HFA) 108 (90 Base) MCG/ACT inhaler Inhale 2 puffs into the lungs every 6 (six) hours as needed for wheezing or shortness of breath (cough). 6.7 g 2  . allopurinol (ZYLOPRIM) 100 MG tablet Take 100 mg by mouth daily.    Marland Kitchen amLODipine (NORVASC) 10 MG tablet Take 10 mg by mouth daily.     Marland Kitchen aspirin EC 81 MG tablet Take 81 mg by mouth daily.    Marland Kitchen b complex-vitamin c-folic acid (NEPHRO-VITE) 0.8 MG TABS tablet Take 1 tablet by mouth daily.  3  . BRILINTA 90 MG TABS tablet Take 90 mg by mouth 2 (two) times daily.     . calcitRIOL (ROCALTROL) 0.5 MCG capsule Take 2 capsules (1 mcg total) by mouth daily. (Patient not taking: Reported on 10/08/2019) 30 capsule 0  . ferric citrate (AURYXIA) 1 GM 210 MG(Fe) tablet Take 210 mg by mouth 3 (three) times daily with meals.    Marland Kitchen  fluticasone (FLONASE) 50 MCG/ACT nasal spray Place 1 spray into both nostrils daily as needed for allergies.     . Fluticasone-Salmeterol (ADVAIR DISKUS) 250-50 MCG/DOSE AEPB Inhale 1 puff into the lungs 2 (two) times daily. (Patient not taking: Reported on 10/14/2019) 60 each 5  . gentamicin cream (GARAMYCIN) 0.1 % Apply 1 application topically daily.    Marland Kitchen levothyroxine (SYNTHROID) 50 MCG tablet Take 50 mcg by mouth daily before breakfast.    . omeprazole (PRILOSEC) 20 MG capsule Take 20 mg by  mouth daily in the afternoon.     . rosuvastatin (CRESTOR) 5 MG tablet Take 5 mg by mouth every Monday, Wednesday, and Friday.     No facility-administered medications prior to visit.    Review of Systems  Constitutional: Negative for chills and fever.  HENT: Negative for congestion.   Respiratory: Positive for cough. Negative for hemoptysis and shortness of breath.   Cardiovascular: Negative for chest pain and leg swelling.  Gastrointestinal: Negative for heartburn, nausea and vomiting.  Endo/Heme/Allergies: Negative for environmental allergies.     Objective:   There were no vitals filed for this visit.   on  RA BMI Readings from Last 3 Encounters:  10/08/19 27.94 kg/m  07/21/19 28.94 kg/m  06/30/19 29.57 kg/m   Wt Readings from Last 3 Encounters:  10/08/19 206 lb (93.4 kg)  07/21/19 213 lb 6.4 oz (96.8 kg)  06/30/19 218 lb (98.9 kg)    Physical Exam - Limited due to televisit. No coughing observed, no conversational dyspnea.    CBC    Component Value Date/Time   WBC 9.3 04/15/2019 1140   RBC 3.45 (L) 04/15/2019 1140   HGB 10.9 (L) 04/16/2019 0809   HCT 32.0 (L) 04/16/2019 0809   PLT 270 04/15/2019 1140   MCV 97.7 04/15/2019 1140   MCH 30.7 04/15/2019 1140   MCHC 31.5 04/15/2019 1140   RDW 15.2 04/15/2019 1140   LYMPHSABS 2.4 08/04/2012 0839   MONOABS 0.7 08/04/2012 0839   EOSABS 0.4 08/04/2012 0839   BASOSABS 0.0 08/04/2012 0839   Micro: 07/01/2019 respiratory culture-normal flora 07/01/2019 sputum fungus-negative 07/01/2019 sputum AFB-negative  04/13/2019 Covid negative  Chest Imaging- films reviewed: CXR 06/17/2019, reviewed- increased opacification bilateral lower lobes, especially left lower lobe adjacent to the cardiac apex.  CT abdomen 12/2017-lower lobe scarring bilaterally  CT chest 07/03/2019-mild groundglass opacities bilateral bases.  Mild airway thickening.  Patulous esophagus.  No significant mediastinal or hilar adenopathy.  Tiny  centrilobular nodules throughout.  CXR 10/14/19: Normal CXR, right IJ dialysis catheter  Pulmonary Functions Testing Results: No flowsheet data found.   Echocardiogram: LVEF 72%, no diastolic dysfunction, normal wall motion.  Normal RV.  Mildly dilated left atrium but normal right atrium.  Heart Catheterization August 2020: CTO RCA, able to be fully intervened upon.  Planning repeat cath in the future. 02/06/2019- nonobstructive coronary artery disease, 100% mid RCA CTO with left-to-right collaterals. Planning for repeat heart cath 07/17/2019  Assessment & Plan:   No diagnosis found.   Groundglass nodule -Follow up CT scan 6 months (April 2021).  I will follow up with him after the scan.  Chronic cough-improved after switching from PD to HD.  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  Previously minimally improved on LABA-ICS, PPI, Flonase.  Not on an ACE inhibitor.   -Continue HD-appreciate nephrology's help -Continue Flonase.  Anticipate ongoing minor cough will be chronic, but is significantly improved and appears to not be bothersome.  Current Outpatient Medications:  .  albuterol (VENTOLIN HFA) 108 (90 Base) MCG/ACT inhaler, Inhale 2 puffs into the lungs every 6 (six) hours as needed for wheezing or shortness of breath (cough)., Disp: 6.7 g, Rfl: 2 .  allopurinol (ZYLOPRIM) 100 MG tablet, Take 100 mg by mouth daily., Disp: , Rfl:  .  amLODipine (NORVASC) 10 MG tablet, Take 10 mg by mouth daily. , Disp: , Rfl:  .  aspirin EC 81 MG tablet, Take 81 mg by mouth daily., Disp: , Rfl:  .  b complex-vitamin c-folic acid (NEPHRO-VITE) 0.8 MG TABS tablet, Take 1 tablet by mouth daily., Disp: , Rfl: 3 .  BRILINTA 90 MG TABS tablet, Take 90 mg by mouth 2 (two) times daily. , Disp: , Rfl:  .  calcitRIOL (ROCALTROL) 0.5 MCG capsule, Take 2 capsules (1 mcg total) by mouth daily. (Patient not taking: Reported on 10/08/2019), Disp: 30 capsule, Rfl: 0 .  ferric  citrate (AURYXIA) 1 GM 210 MG(Fe) tablet, Take 210 mg by mouth 3 (three) times daily with meals., Disp: , Rfl:  .  fluticasone (FLONASE) 50 MCG/ACT nasal spray, Place 1 spray into both nostrils daily as needed for allergies. , Disp: , Rfl:  .  Fluticasone-Salmeterol (ADVAIR DISKUS) 250-50 MCG/DOSE AEPB, Inhale 1 puff into the lungs 2 (two) times daily. (Patient not taking: Reported on 10/14/2019), Disp: 60 each, Rfl: 5 .  gentamicin cream (GARAMYCIN) 0.1 %, Apply 1 application topically daily., Disp: , Rfl:  .  levothyroxine (SYNTHROID) 50 MCG tablet, Take 50 mcg by mouth daily before breakfast., Disp: , Rfl:  .  omeprazole (PRILOSEC) 20 MG capsule, Take 20 mg by mouth daily in the afternoon. , Disp: , Rfl:  .  rosuvastatin (CRESTOR) 5 MG tablet, Take 5 mg by mouth every Monday, Wednesday, and Friday., Disp: , Rfl:    Julian Hy, DO [facility] 11/11/2019 7:06 AM

## 2019-11-13 ENCOUNTER — Ambulatory Visit: Payer: Medicare Other | Admitting: Nurse Practitioner

## 2019-11-13 ENCOUNTER — Encounter: Payer: Self-pay | Admitting: Nurse Practitioner

## 2019-11-13 VITALS — BP 128/66 | HR 72 | Temp 98.5°F | Ht 72.0 in | Wt 201.0 lb

## 2019-11-13 DIAGNOSIS — R05 Cough: Secondary | ICD-10-CM

## 2019-11-13 DIAGNOSIS — R059 Cough, unspecified: Secondary | ICD-10-CM

## 2019-11-13 DIAGNOSIS — K2100 Gastro-esophageal reflux disease with esophagitis, without bleeding: Secondary | ICD-10-CM

## 2019-11-13 NOTE — Progress Notes (Signed)
IMPRESSION and PLAN:     64 yo male with a pmh significant for but not limited to CKD, status post nephrectomy, CAD / PCI history of polycystic kidney disease, parathyroid adenomas ( surgery x3), chronic anticoagulation, GERD with erosive esophagitis.   # Chronic cough.  -Generally  dry during day, productive of brown phlegm in am and in evening. Etiology unclear. No GERD symptoms on daily PPI. Suppose cough could be secondary to laryngopharyngeal reflux.  - Doubt another EGD would be helpful. I plan to discuss with Dr. Carlean Purl, ? Maybe ENT evaluation  # Groundglass nodule -for follow up CT scan in April 2021 per Pulmonary      HPI:    Primary GI: Dr. Carlean Purl  Chief complaint : coughing / excessive phlegm present for years.    Phillip Blair is here with complaints of cough / excessive phlegm. History comes from chart, patient and his wife. PCP referred to him to Pulmonologist, precribed nasal spray and inhaler, doesn't feel like it helped. Has lung nodule and to return in April for chest CT.  Patient says he thinks cough better since changing from peritoneal dialysis to hemodialysis but wife disagrees.  Pulmonologist thought dialysate was causing irritation of diaphragm contributing to the cough but again wife doesn't think cough any better on HD. Complains of violent, hacking cough during the day which is mostly dry. At night and in am he coughs of brownish phlegm. Wife also notices the coughing is worse during meals but patient thinks it is because he of eating at fast pace. No dysphagia to solids nor liquids.  Admits to a little sinus drainage. Not on ACE inhibitor. Denies heartburn / reflux as long as he takes daily Omeprazole. Limits spicy and fried food and tries to eat earlier in the day before bedtime. Cough present for years but wife says it is getting worse. May feel nauseated at times with the coughing.  No other GI complaints  History of parathyroid surgery x 3  ( last one October).    Data Reviewed:  01/03/18 EGD - for heartburn and suspected GI bleed -Erosive esophagitis LA Grade B. - Hiatal Henia. - Wide-open Schatzki's ring was noted.  Review of systems:     No chest pain, no SOB, no fevers, no urinary sx   Past Medical History:  Diagnosis Date  . Allergy   . Anemia    low iron  . Chronic kidney disease    denies  . Coronary artery disease    02/06/19: Mild non-obstructive disease on left, 100% mid RCA with left-to-right collaterals. No PCI. S/p attempted RCA PCI 04/2019. S/p DES pRCA with concern for dissection, stented through mRCA with good result 07/17/19. DAPT x 6 months rec. [facility])  . ESRD (end stage renal disease) ([place])    s/p LUE basilic vein transpositiono 2017; s/p peritoneal dialysis catheter 12/31/17  . Family history of kidney stone   . GERD (gastroesophageal reflux disease)    occ  . Gout   . Hypercalcemia   . Hyperlipidemia   . Hypertension    does not see a cardiologist  . Hypothyroidism   . Parathyroid adenoma   . Polycystic kidney disease   . Renal cancer ([place])    kidney  . Sleep apnea    no longer uses cpap, has lost weight  . Wears glasses     Patient's surgical history, family medical history, social history, medications and allergies were  all reviewed in Epic   Creatinine clearance cannot be calculated (Patient's most recent lab result is older than the maximum 21 days allowed.)  Current Outpatient Medications  Medication Sig Dispense Refill  . albuterol (VENTOLIN HFA) 108 (90 Base) MCG/ACT inhaler Inhale 2 puffs into the lungs every 6 (six) hours as needed for wheezing or shortness of breath (cough). 6.7 g 2  . allopurinol (ZYLOPRIM) 100 MG tablet Take 100 mg by mouth daily.    Marland Kitchen amLODipine (NORVASC) 10 MG tablet Take 10 mg by mouth daily.     Marland Kitchen aspirin EC 81 MG tablet Take 81 mg by mouth daily.    Marland Kitchen b complex-vitamin c-folic acid (NEPHRO-VITE) 0.8 MG TABS tablet Take 1 tablet by mouth daily.  3  .  BRILINTA 90 MG TABS tablet Take 90 mg by mouth 2 (two) times daily.     . calcitRIOL (ROCALTROL) 0.5 MCG capsule Take 2 capsules (1 mcg total) by mouth daily. 30 capsule 0  . ferric citrate (AURYXIA) 1 GM 210 MG(Fe) tablet Take 210 mg by mouth 3 (three) times daily with meals.    . fluticasone (FLONASE) 50 MCG/ACT nasal spray Place 1 spray into both nostrils daily as needed for allergies.     Marland Kitchen gentamicin cream (GARAMYCIN) 0.1 % Apply 1 application topically daily.    Marland Kitchen levothyroxine (SYNTHROID) 50 MCG tablet Take 50 mcg by mouth daily before breakfast.    . omeprazole (PRILOSEC) 20 MG capsule Take 20 mg by mouth daily in the afternoon.     . rosuvastatin (CRESTOR) 5 MG tablet Take 5 mg by mouth every Monday, Wednesday, and Friday.     No current facility-administered medications for this visit.    Physical Exam:     BP 128/66   Pulse 72   Temp 98.5 F (36.9 C)   Ht 6' (1.829 m)   Wt 201 lb (91.2 kg)   BMI 27.26 kg/m   GENERAL:  Pleasant male in NAD PSYCH: : Cooperative, normal affect CARDIAC:  RRR,  no peripheral edema PULM: Normal respiratory effort, lungs CTA bilaterally, no wheezing ABDOMEN:  Nondistended, soft, nontender. No obvious masses, no hepatomegaly,  normal bowel sounds SKIN:  turgor, no lesions seen Musculoskeletal:  Normal muscle tone, normal strength NEURO: Alert and oriented x 3, no focal neurologic deficits  I spent 30 minutes total reviewing records, obtaining history, performing exam, counseling patient and documenting visit / findings.   Phillip Blair , NP 11/13/2019, 11:03 AM

## 2019-11-13 NOTE — Patient Instructions (Signed)
If you are age 64 or older, your body mass index should be between 23-30. Your Body mass index is 27.26 kg/m. If this is out of the aforementioned range listed, please consider follow up with your Primary Care Provider.  If you are age 74 or younger, your body mass index should be between 19-25. Your Body mass index is 27.26 kg/m. If this is out of the aformentioned range listed, please consider follow up with your Primary Care Provider.   Nevin Bloodgood will call you once she speaks with Dr. Carlean Purl.

## 2019-11-24 ENCOUNTER — Encounter (HOSPITAL_COMMUNITY): Payer: Self-pay

## 2019-11-24 ENCOUNTER — Emergency Department (HOSPITAL_COMMUNITY)
Admission: EM | Admit: 2019-11-24 | Discharge: 2019-11-24 | Disposition: A | Discharge status: Home / Self Care | Payer: Medicare Other | Attending: Emergency Medicine | Admitting: Emergency Medicine

## 2019-11-24 ENCOUNTER — Emergency Department (HOSPITAL_COMMUNITY): Payer: Medicare Other

## 2019-11-24 ENCOUNTER — Other Ambulatory Visit: Payer: Self-pay

## 2019-11-24 DIAGNOSIS — Z85528 Personal history of other malignant neoplasm of kidney: Secondary | ICD-10-CM | POA: Diagnosis not present

## 2019-11-24 DIAGNOSIS — I120 Hypertensive chronic kidney disease with stage 5 chronic kidney disease or end stage renal disease: Secondary | ICD-10-CM | POA: Insufficient documentation

## 2019-11-24 DIAGNOSIS — I2510 Atherosclerotic heart disease of native coronary artery without angina pectoris: Secondary | ICD-10-CM | POA: Diagnosis not present

## 2019-11-24 DIAGNOSIS — Z20822 Contact with and (suspected) exposure to covid-19: Secondary | ICD-10-CM | POA: Diagnosis not present

## 2019-11-24 DIAGNOSIS — Z992 Dependence on renal dialysis: Secondary | ICD-10-CM | POA: Diagnosis not present

## 2019-11-24 DIAGNOSIS — Z7982 Long term (current) use of aspirin: Secondary | ICD-10-CM | POA: Insufficient documentation

## 2019-11-24 DIAGNOSIS — R509 Fever, unspecified: Secondary | ICD-10-CM | POA: Diagnosis present

## 2019-11-24 DIAGNOSIS — N186 End stage renal disease: Secondary | ICD-10-CM | POA: Insufficient documentation

## 2019-11-24 DIAGNOSIS — E039 Hypothyroidism, unspecified: Secondary | ICD-10-CM | POA: Diagnosis not present

## 2019-11-24 DIAGNOSIS — D849 Immunodeficiency, unspecified: Secondary | ICD-10-CM

## 2019-11-24 DIAGNOSIS — N390 Urinary tract infection, site not specified: Secondary | ICD-10-CM | POA: Diagnosis not present

## 2019-11-24 DIAGNOSIS — Z79899 Other long term (current) drug therapy: Secondary | ICD-10-CM | POA: Insufficient documentation

## 2019-11-24 LAB — URINALYSIS, ROUTINE W REFLEX MICROSCOPIC
Bilirubin Urine: NEGATIVE
Glucose, UA: 50 mg/dL — AB
Hgb urine dipstick: NEGATIVE
Ketones, ur: 5 mg/dL — AB
Nitrite: NEGATIVE
Protein, ur: 100 mg/dL — AB
Specific Gravity, Urine: 1.01 (ref 1.005–1.030)
pH: 9 — ABNORMAL HIGH (ref 5.0–8.0)

## 2019-11-24 LAB — CBC WITH DIFFERENTIAL/PLATELET
Abs Immature Granulocytes: 0.11 10*3/uL — ABNORMAL HIGH (ref 0.00–0.07)
Basophils Absolute: 0 10*3/uL (ref 0.0–0.1)
Basophils Relative: 0 %
Eosinophils Absolute: 0 10*3/uL (ref 0.0–0.5)
Eosinophils Relative: 0 %
HCT: 33.8 % — ABNORMAL LOW (ref 39.0–52.0)
Hemoglobin: 10.5 g/dL — ABNORMAL LOW (ref 13.0–17.0)
Immature Granulocytes: 1 %
Lymphocytes Relative: 4 %
Lymphs Abs: 0.6 10*3/uL — ABNORMAL LOW (ref 0.7–4.0)
MCH: 30.4 pg (ref 26.0–34.0)
MCHC: 31.1 g/dL (ref 30.0–36.0)
MCV: 98 fL (ref 80.0–100.0)
Monocytes Absolute: 0.7 10*3/uL (ref 0.1–1.0)
Monocytes Relative: 4 %
Neutro Abs: 15.3 10*3/uL — ABNORMAL HIGH (ref 1.7–7.7)
Neutrophils Relative %: 91 %
Platelets: 226 10*3/uL (ref 150–400)
RBC: 3.45 MIL/uL — ABNORMAL LOW (ref 4.22–5.81)
RDW: 17.4 % — ABNORMAL HIGH (ref 11.5–15.5)
WBC: 16.8 10*3/uL — ABNORMAL HIGH (ref 4.0–10.5)
nRBC: 0 % (ref 0.0–0.2)

## 2019-11-24 LAB — COMPREHENSIVE METABOLIC PANEL
ALT: 39 U/L (ref 0–44)
AST: 35 U/L (ref 15–41)
Albumin: 3.6 g/dL (ref 3.5–5.0)
Alkaline Phosphatase: 130 U/L — ABNORMAL HIGH (ref 38–126)
Anion gap: 16 — ABNORMAL HIGH (ref 5–15)
BUN: 39 mg/dL — ABNORMAL HIGH (ref 8–23)
CO2: 26 mmol/L (ref 22–32)
Calcium: 9 mg/dL (ref 8.9–10.3)
Chloride: 92 mmol/L — ABNORMAL LOW (ref 98–111)
Creatinine, Ser: 7.26 mg/dL — ABNORMAL HIGH (ref 0.61–1.24)
GFR calc Af Amer: 8 mL/min — ABNORMAL LOW (ref >60)
GFR calc non Af Amer: 7 mL/min — ABNORMAL LOW (ref >60)
Glucose, Bld: 111 mg/dL — ABNORMAL HIGH (ref 70–99)
Potassium: 4.2 mmol/L (ref 3.5–5.1)
Sodium: 134 mmol/L — ABNORMAL LOW (ref 135–145)
Total Bilirubin: 0.8 mg/dL (ref 0.3–1.2)
Total Protein: 7.6 g/dL (ref 6.5–8.1)

## 2019-11-24 LAB — PROTIME-INR
INR: 1.1 (ref 0.8–1.2)
Prothrombin Time: 14.5 seconds (ref 11.4–15.2)

## 2019-11-24 LAB — LACTIC ACID, PLASMA: Lactic Acid, Venous: 1.1 mmol/L (ref 0.5–1.9)

## 2019-11-24 MED ORDER — ACETAMINOPHEN 325 MG PO TABS
650.0000 mg | ORAL_TABLET | Freq: Once | ORAL | Status: AC
  Administered 2019-11-24: 650 mg via ORAL
  Filled 2019-11-24: qty 2

## 2019-11-24 MED ORDER — CEPHALEXIN 500 MG PO CAPS: 500.0000 mg | ORAL_CAPSULE | Freq: Four times a day (QID) | ORAL | 0 refills | Status: DC

## 2019-11-24 MED ORDER — CEPHALEXIN 250 MG PO CAPS
500.0000 mg | ORAL_CAPSULE | Freq: Once | ORAL | Status: AC
  Administered 2019-11-24: 500 mg via ORAL
  Filled 2019-11-24: qty 2

## 2019-11-24 MED ORDER — ACETAMINOPHEN 325 MG PO TABS
650.0000 mg | ORAL_TABLET | Freq: Once | ORAL | Status: AC | PRN
  Administered 2019-11-24: 650 mg via ORAL
  Filled 2019-11-24: qty 2

## 2019-11-24 NOTE — ED Provider Notes (Signed)
Medical screening examination/treatment/procedure(s) were conducted as a shared visit with non-physician practitioner(s) and myself.  I personally evaluated the patient during the encounter.  Dialysis patient with fever of unkown origin. Endorses increased urination, dysuria and weakness at this time. No cough or resp symptoms. No gi symptoms. No rashes.  On exam, febrile, high hr (98), BP stable, appears well. No suprapubic ttp, abdominal pain or cva ttp.  Could make case for admission or discharge however patient overall appears well. Possibly early pyelo but non-toxic appearing. Plan for blood/urine cultures. Will contact nephrologist to help with antibiotic administration in setting of dialysis.  I personally discussed strict return precautions with the patient.        Temperance Kelemen, Corene Cornea, MD 11/25/19 [phone]

## 2019-11-24 NOTE — ED Provider Notes (Signed)
[facility] EMERGENCY DEPARTMENT Provider Note   CSN: [id number] Arrival date & time: 11/24/19  1733     History Chief Complaint  Patient presents with  . Fever    Phillip Blair is a 64 y.o. male.  Patient with history of CKD, ESRD-HD (previously PD until Nov 2020), GERD, HLD, HTN, thyroid disease, presents with fever since yesterday. He reports Tmax at home of 102 starting yesterday morning with onset of chills and general malaise. He denies cough, congestion, aches, nausea, vomiting, diarrhea. His PD catheter remains in his left lower abdomen, unused since 07/2019. He denies abdominal pain. He reports increased urination since yesterday but denies hematuria or dysuria. As a dialysis patient, he reports he urinates very infrequently, however, since yesterday has urinated 5-6 times.   The history is provided by the patient. No language interpreter was used.  Fever Associated symptoms: chills   Associated symptoms: no chest pain, no cough, no dysuria, no myalgias, no nausea and no vomiting        Past Medical History:  Diagnosis Date  . Allergy   . Anemia    low iron  . Chronic kidney disease    denies  . Coronary artery disease    02/06/19: Mild non-obstructive disease on left, 100% mid RCA with left-to-right collaterals. No PCI. S/p attempted RCA PCI 04/2019. S/p DES pRCA with concern for dissection, stented through mRCA with good result 07/17/19. DAPT x 6 months rec. [facility])  . ESRD (end stage renal disease) ([place])    s/p LUE basilic vein transpositiono 2017; s/p peritoneal dialysis catheter 12/31/17  . Family history of kidney stone   . GERD (gastroesophageal reflux disease)    occ  . Gout   . Hypercalcemia   . Hyperlipidemia   . Hypertension    does not see a cardiologist  . Hypothyroidism   . Parathyroid adenoma   . Polycystic kidney disease   . Renal cancer ([place])    kidney  . Sleep apnea    no longer uses cpap, has lost weight  . Wears  glasses     Patient Active Problem List   Diagnosis Date Noted  . Secondary hyperparathyroidism of renal origin ([employer]) 04/16/2019  . Hyperparathyroidism, secondary ([place]) 04/12/2019  . Symptomatic anemia   . Hiatal hernia with gastroesophageal reflux disease and esophagitis   . Hx of adenomatous polyp of colon 07/30/2017  . Renal mass 01/19/2015  . Parathyroid adenoma 03/13/2013  . Pain in joint, shoulder region 02/25/2013  . Tendonitis of shoulder 02/25/2013  . Hypercalcemia 02/25/2013  . HLD (hyperlipidemia) 01/14/2013  . Chronic kidney disease, stage V ([place]) 08/11/2012  . SLEEP APNEA, OBSTRUCTIVE, SEVERE 06/15/2010  . HYPERSOMNIA, ASSOCIATED WITH SLEEP APNEA 05/11/2010  . GERD 10/28/2009  . GOUT 04/13/2008  . HYPERLIPIDEMIA 05/02/2007  . Essential hypertension 05/02/2007  . ALLERGIC RHINITIS 05/02/2007  . NEPHROLITHIASIS, HX OF 05/02/2007    Past Surgical History:  Procedure Laterality Date  . AV FISTULA PLACEMENT Left 03/30/2016   Procedure: FIRST STAGE BASILIC VEIN TRANSPOSITION LEFT UPPER ARM;  Surgeon: Serafina Mitchell, MD;  Location: [facility];  Service: Vascular;  Laterality: Left;  . BASCILIC VEIN TRANSPOSITION Left 06/13/2016   Procedure: SECOND STAGE BASILIC VEIN TRANSPOSITION;  Surgeon: Serafina Mitchell, MD;  Location: [facility];  Service: Vascular;  Laterality: Left;  . COLONOSCOPY    . ESOPHAGOGASTRODUODENOSCOPY (EGD) WITH PROPOFOL N/A 01/03/2018   Procedure: ESOPHAGOGASTRODUODENOSCOPY (EGD) WITH PROPOFOL;  Surgeon: Jackquline Denmark, MD;  Location[facility]  ENDOSCOPY;  Service: Endoscopy;  Laterality: N/A;  . MINIMALLY INVASIVE RADIOACTIVE PARATHYROIDECTOMY N/A 05/04/2013   Procedure: PARATHYROIDECTOMY MINIMALLY INVASIVE;  Surgeon: Harl Bowie, MD;  Location: [place] [facility];  Service: General;  Laterality: N/A;  . MINIMALLY INVASIVE RADIOACTIVE PARATHYROIDECTOMY N/A 10/20/2013   Procedure: MINIMALLY INVASIVE PARATHYROIDECTOMY CONVERTED TO COMPLETE NECK EXPLORATION;  Surgeon: Harl Bowie, MD;  Location: [facility];  Service: General;  Laterality: N/A;  . NASAL SEPTOPLASTY W/ TURBINOPLASTY    . PARATHYROIDECTOMY  04/16/2019  . PARATHYROIDECTOMY N/A 04/16/2019   Procedure: NECK EXPLORATION AND PARATHYROIDECTOMY;  Surgeon: Armandina Gemma, MD;  Location: [facility];  Service: General;  Laterality: N/A;  . ROBOT ASSISTED LAPAROSCOPIC NEPHRECTOMY Left 01/19/2015   Procedure: ROBOTIC ASSISTED LAPAROSCOPIC RADICAL NEPHRECTOMY AND INTRAOPERATIVE ULTRASOUND ;  Surgeon: Alexis Frock, MD;  Location: [facility];  Service: Urology;  Laterality: Left;  . THYROID LOBECTOMY Right 04/16/2019   Procedure: RIGHT THYROID LOBECTOMY;  Surgeon: Armandina Gemma, MD;  Location: [facility];  Service: General;  Laterality: Right;  Marland Kitchen VASECTOMY         Family History  Problem Relation Age of Onset  . Polycystic kidney disease Mother   . Blindness Father        passed from [place]  . Asthma Child   . Colon cancer Neg Hx   . Rectal cancer Neg Hx   . Stomach cancer Neg Hx   . Liver cancer Neg Hx   . Esophageal cancer Neg Hx     Social History   Tobacco Use  . Smoking status: Never Smoker  . Smokeless tobacco: Never Used  Substance Use Topics  . Alcohol use: Yes    Alcohol/week: 2.0 standard drinks    Types: 2 Glasses of wine per week    Comment: occasional  . Drug use: No    Home Medications Prior to Admission medications   Medication Sig Start Date End Date Taking? Authorizing Provider  albuterol (VENTOLIN HFA) 108 (90 Base) MCG/ACT inhaler Inhale 2 puffs into the lungs every 6 (six) hours as needed for wheezing or shortness of breath (cough). 06/30/19   Julian Hy, DO  allopurinol (ZYLOPRIM) 100 MG tablet Take 100 mg by mouth daily. 02/22/16   [provider]  amLODipine (NORVASC) 10 MG tablet Take 10 mg by mouth daily.     [provider]  aspirin EC 81 MG tablet Take 81 mg by mouth daily.    [provider]  b complex-vitamin c-folic acid (NEPHRO-VITE) 0.8 MG TABS tablet  Take 1 tablet by mouth daily. 05/30/18   [provider]  BRILINTA 90 MG TABS tablet Take 90 mg by mouth 2 (two) times daily.  06/05/19   [provider]  calcitRIOL (ROCALTROL) 0.5 MCG capsule Take 2 capsules (1 mcg total) by mouth daily. 04/18/19   Johnathan Hausen, MD  ferric citrate (AURYXIA) 1 GM 210 MG(Fe) tablet Take 210 mg by mouth 3 (three) times daily with meals.    [provider]  fluticasone (FLONASE) 50 MCG/ACT nasal spray Place 1 spray into both nostrils daily as needed for allergies.     [provider]  gentamicin cream (GARAMYCIN) 0.1 % Apply 1 application topically daily. 08/01/19   [provider]  levothyroxine (SYNTHROID) 50 MCG tablet Take 50 mcg by mouth daily before breakfast. 08/03/19   [provider]  omeprazole (PRILOSEC) 20 MG capsule Take 20 mg by mouth daily in the afternoon.     [provider]  rosuvastatin (  CRESTOR) 5 MG tablet Take 5 mg by mouth every Monday, Wednesday, and Friday.    [provider]    Allergies    Ivp dye [iodinated diagnostic agents], Other, and Iodine-131  Review of Systems   Review of Systems  Constitutional: Positive for chills, fatigue and fever.  HENT: Negative.   Respiratory: Negative.  Negative for cough and shortness of breath.   Cardiovascular: Negative for chest pain.  Gastrointestinal: Negative.  Negative for abdominal pain, nausea and vomiting.  Genitourinary: Positive for frequency. Negative for dysuria, flank pain and hematuria.  Musculoskeletal: Negative.  Negative for myalgias.  Skin: Negative.   Neurological: Negative.  Negative for weakness and light-headedness.    Physical Exam Updated Vital Signs BP 128/79   Pulse (!) 103   Temp (!) 101.3 F (38.5 C) (Oral)   Resp 16   Ht 6' (1.829 m)   Wt 88.5 kg   SpO2 97%   BMI 26.45 kg/m   Physical Exam Vitals and nursing note reviewed.  Constitutional:      General: He is not in acute distress.     Appearance: Normal appearance. He is well-developed. He is not ill-appearing.  HENT:     Head: Normocephalic.  Cardiovascular:     Rate and Rhythm: Normal rate and regular rhythm.     Heart sounds: No murmur.  Pulmonary:     Effort: Pulmonary effort is normal.     Breath sounds: Normal breath sounds. No wheezing, rhonchi or rales.     Comments: Hemodialysis catheter in right upper chest. Site unremarkable.  Chest:     Chest wall: No tenderness.  Abdominal:     General: Bowel sounds are normal.     Palpations: Abdomen is soft.     Tenderness: There is no abdominal tenderness. There is no guarding or rebound.     Comments: Peritoneal dialysis catheter in LLQ. Site unremarkable, abdomen soft and nontender.  Musculoskeletal:        General: Normal range of motion.     Cervical back: Normal range of motion and neck supple.  Skin:    General: Skin is warm and dry.     Findings: No rash.  Neurological:     General: No focal deficit present.     Mental Status: He is alert and oriented to person, place, and time.     ED Results / Procedures / Treatments   Labs (all labs ordered are listed, but only abnormal results are displayed) Labs Reviewed  COMPREHENSIVE METABOLIC PANEL - Abnormal; Notable for the following components:      Result Value   Sodium 134 (*)    Chloride 92 (*)    Glucose, Bld 111 (*)    BUN 39 (*)    Creatinine, Ser 7.26 (*)    Alkaline Phosphatase 130 (*)    GFR calc non Af Amer 7 (*)    GFR calc Af Amer 8 (*)    Anion gap 16 (*)    All other components within normal limits  CBC WITH DIFFERENTIAL/PLATELET - Abnormal; Notable for the following components:   WBC 16.8 (*)    RBC 3.45 (*)    Hemoglobin 10.5 (*)    HCT 33.8 (*)    RDW 17.4 (*)    Neutro Abs 15.3 (*)    Lymphs Abs 0.6 (*)    Abs Immature Granulocytes 0.11 (*)    All other components within normal limits  URINALYSIS, ROUTINE W REFLEX MICROSCOPIC - Abnormal; Notable  for the following  components:   APPearance HAZY (*)    pH 9.0 (*)    Glucose, UA 50 (*)    Ketones, ur 5 (*)    Protein, ur 100 (*)    Leukocytes,Ua MODERATE (*)    Bacteria, UA MANY (*)    All other components within normal limits  CULTURE, BLOOD (ROUTINE X 2)  CULTURE, BLOOD (ROUTINE X 2)  LACTIC ACID, PLASMA  PROTIME-INR  LACTIC ACID, PLASMA    EKG None  Radiology DG Chest 2 View  Result Date: 11/24/2019 CLINICAL DATA:  Suspected sepsis. EXAM: CHEST - 2 VIEW COMPARISON:  October 14, 2019 FINDINGS: The cardiomediastinal silhouette is normal. A right internal jugular dual lumen central vascular catheter tip projects in the distal superior vena cava. Surgical clips project on the superior mediastinum. There is no pulmonary edema, pleural effusion, pneumonia or pneumothorax. Mild apical pleural thickening is present. There are mild skeletal degenerative changes. There is no free subdiaphragmatic air. IMPRESSION: 1. No pneumonia. 2. Right internal jugular dual lumen central vascular catheter. Electronically Signed   By: Revonda Humphrey   On: 11/24/2019 18:31    Procedures Procedures (including critical care time)  Medications Ordered in ED Medications  acetaminophen (TYLENOL) tablet 650 mg (650 mg Oral Given 11/24/19 1758)    ED Course  I have reviewed the triage vital signs and the nursing notes.  Pertinent labs & imaging results that were available during my care of the patient were reviewed by me and considered in my medical decision making (see chart for details).    MDM Rules/Calculators/A&P                      Patient to ED for evaluation of fever as detailed in the HPI.   He is very well appearing. No sign of distress, sepsis, respiratory infection, infection associated with dialysis catheters. His urinalysis is consistent with infection. No flank pain, vomiting. He is urinating 5-6 times daily which is highly unusual. On re-questioning he does report some burning with urination. Cultures  for blood and urine are pending.   The patient is seen by Dr. Dayna Barker who feels discharge home is appropriate. Strict return precautions discussed including high, uncontrolled fever, onset of vomiting, unable to take regular medications, confusion, lethargy. The patient acknowledges understanding. He confirms that he has no barriers to easy return to the hospital if needed.   Discussed COVID testing. He has received his first vaccine, due for the second dose next week. He has no respiratory symptoms. He has a leukocytosis, which is not c/w COVID. He does prefer screening test be collected and sent. This will result in 6-24 hours.   Will start on Keflex. He is aware he needs to coordinate dosing with his dialysis and will do this at his next session. Epic message sent to his nephrologist [facility]) to facilitate coordination of care.   Final Clinical Impression(s) / ED Diagnoses Final diagnoses:  None   1. UTI 2. Febrile illness 3. Immunocompromised state  Rx / DC Orders ED Discharge Orders    None       Dennie Bible 11/24/19 2316    Mesner, Corene Cornea, MD 11/25/19 [phone]

## 2019-11-24 NOTE — ED Triage Notes (Signed)
Pt arrives POV for eval of fever since last night. States he called his PCP today who advised him to present here for r/o infection. Pt reports he was able to complete full course of dialysis today. Reports dry cough x 1 week. Pt reports 1 of 2 Covid Vaccines

## 2019-11-24 NOTE — Discharge Instructions (Addendum)
As discussed with you in the emergency department, you have an infection in your urine that we are treating with antibiotics. There are cultures pending that, if positive over the next 24-48 hours, you will be contacted to discuss any needed change to your treatment which may include hospitalization. You preferred to have a COVID test collected and this will also result in MyChart for you to review.   If you develop any of the following symptoms, please return to the Emergency department immediately: high or uncontrollable fever, vomiting causing you to be unable to take your medications, confusion, excessive fatigue or difficulty in waking up (lethargy), you start having pain or if you develop new symptoms of concern to you.   Your kidney doctor has been made aware of your infection and the chosen treatment. You will need to discuss how you take your antibiotic with him and/or your dialysis nurses as how best to take it in the setting of dialysis treatments will be determined by them. Until your next session, take it 4 times daily as prescribed.

## 2019-11-25 ENCOUNTER — Ambulatory Visit: Payer: Medicare Other

## 2019-11-25 LAB — SARS CORONAVIRUS 2 (TAT 6-24 HRS): SARS Coronavirus 2: NEGATIVE

## 2019-11-29 LAB — CULTURE, BLOOD (ROUTINE X 2)
Culture: NO GROWTH
Special Requests: ADEQUATE

## 2019-12-30 ENCOUNTER — Telehealth: Payer: Self-pay | Admitting: Critical Care Medicine

## 2019-12-30 NOTE — Telephone Encounter (Signed)
ATC patient LMTCB to get him an OV after his CT on 4/14. Will wait to hear back from patient

## 2020-01-04 NOTE — Telephone Encounter (Signed)
Patient now has a TELEVISIT appt with DR. Clark on 5/4 at 9:45. Nothing further needed at this time

## 2020-01-05 ENCOUNTER — Other Ambulatory Visit: Payer: Medicare Other

## 2020-01-06 ENCOUNTER — Other Ambulatory Visit: Payer: Self-pay

## 2020-01-06 ENCOUNTER — Ambulatory Visit
Admission: RE | Admit: 2020-01-06 | Discharge: 2020-01-06 | Disposition: A | Discharge status: Home / Self Care | Payer: Medicare Other | Source: Ambulatory Visit | Attending: Critical Care Medicine | Admitting: Critical Care Medicine

## 2020-01-06 DIAGNOSIS — R911 Solitary pulmonary nodule: Secondary | ICD-10-CM

## 2020-01-07 NOTE — Progress Notes (Signed)
Mr. Delene Loll CT scan looks good- no additional follow up needed. Thanks!

## 2020-01-08 NOTE — Progress Notes (Signed)
Patient identification verified. Per DO Noemi Chapel, CT scan looks good, no additional follow up needed. Patient verbalized understanding of results.

## 2020-01-20 ENCOUNTER — Telehealth: Payer: Self-pay

## 2020-01-20 NOTE — Telephone Encounter (Signed)
Called patient to try and set up his BVT with [facility] - we had to originally wait until April for him to come off of his Brilinta. He said that he has been seeing Jeanmarie Plant regarding his catheter and the cardiologist wants him to have everything done at one time so they are trying to coordinate with them to do the access when they change the cath. He said that if they cannot do this he will call to schedule with [name] but he would like to only come off of the blood thinner once.   York Cerise, CMA

## 2020-01-26 ENCOUNTER — Ambulatory Visit (INDEPENDENT_AMBULATORY_CARE_PROVIDER_SITE_OTHER): Payer: Medicare Other | Admitting: Critical Care Medicine

## 2020-01-26 ENCOUNTER — Encounter: Payer: Self-pay | Admitting: Critical Care Medicine

## 2020-01-26 ENCOUNTER — Other Ambulatory Visit: Payer: Self-pay

## 2020-01-26 DIAGNOSIS — R911 Solitary pulmonary nodule: Secondary | ICD-10-CM | POA: Diagnosis not present

## 2020-01-26 NOTE — Progress Notes (Signed)
Synopsis: Referred in October 2020 for chronic cough by [name], MD.   Subjective:   PATIENT ID: Phillip Blair GENDER: male DOB: 01-04-1956, MRN: [id number]  Chief Complaint  Patient presents with  . Follow-up    recent CT   Virtual Visit via Telephone Note  I connected with Phillip Blair on 01/26/20 at  9:45 AM EDT by telephone and verified that I am speaking with the correct person using two identifiers.  Location: Patient: Home Provider: [facility] - [address], [place], [place], [place] [address]    I discussed the limitations, risks, security and privacy concerns of performing an evaluation and management service by telephone and the availability of in person appointments. I also discussed with the patient that there may be a patient responsible charge related to this service. The patient expressed understanding and agreed to proceed.  Patient consented to consult via telephone: Yes People present and their role in pt care: Pt   I discussed the assessment and treatment plan with the patient. The patient was provided an opportunity to ask questions and all were answered. The patient agreed with the plan and demonstrated an understanding of the instructions.   The patient was advised to call back or seek an in-person evaluation if the symptoms worsen or if the condition fails to improve as anticipated.  I provided 10 minutes of non-face-to-face time during this encounter.   Mr. Phillip Blair is following up on pulmonary nodule seen on CT scan last fall when evaluating chronic cough.  He was being evaluated for renal transplant at the time.  His cough significantly improved after switching from PD back to HD.  He currently only coughs about once per day, nothing significant.  He will be having a procedure when he is able to go off Brilinta to have his PD catheter removed and a fistula placed for HD.  No change in symptoms since his last visit.      televisit 11/11/19: Mr. Phillip Blair is a 64 y/o gentleman with a history of PCKD, ESRD on HD, CAD s/p DES in 06/2019 who is following up for chronic cough. This has significantly improved with switching from peritoneal dialysis back to hemodialysis.  In April he has a surgery planned to have his dialysis graft moved in his arm and his PD access removed.  He says an occasional cough, but overall it is significantly improved.  His cough is mostly associated with sinus drainage.  He continues on Flonase.  He was previously on Advair, with no benefit.  He is no longer taking this medication.  He received his first Covid vaccine and is scheduled for second in March.   Televisit 12/30: Mr. Phillip Blair is a 64 year old gentleman with history of end-stage renal disease recently switched from PD to HD.  He is following up today for chronic cough.  Previous work-up including cultures and chest CT have been normal.  The switch from PD to HD has improved his cough significantly.  He only infrequently has episodes of coughing, several hours apart rather than every few minutes.  He has continued to have a tiny amount of white or brown sputum forcing in the morning, which has been going on throughout.  He otherwise has a dry cough.  He has some nasal congestion, and has been using his Flonase less frequently last 2 weeks.  He denies sinus headaches or postnasal drip.  He has had some ongoing throat irritation, and he notices coughing  more when he has been talking a lot. Overall his throat is less sore than when his cough was more frequent.     OV 08/18/2019: Mr. Phillip Blair is following up today for his chronic cough.  His nephrologist is concerned this is due to his peritoneal dialysis.  He had a tunneled dialysis catheter placed yesterday and he is going to do 13 treatments of HD over about a month to take a break from PD to determine if this improves his cough.  He has followed up with his surgeon who agreed it is possible  that his cough could be related to his parathyroid surgeries in the past.  He admits that he has had some issues with swallowing, feeling as though he is about to choke since his surgery last summer.  If his cough resolves with HD, he will undergo fistula placement sometime in 2021.  Since starting the higher dose Advair he has not noticed an improvement in his cough.  He continues to take omeprazole and Flonase.  The Flonase has helped some with his congestion that he has in the morning and evening.  No fever, chills, or other new symptoms.       OV 07/21/2019: Mr. Phillip Blair is a 64 year old gentleman with a history of polycystic kidney disease, end-stage renal disease on peritoneal dialysis, and he is being evaluated for renal [facility] at [facility].  He presents for follow-up of frequent coughing.  Since he was last seen he has been using Advair twice daily (125-50), omeprazole once daily, and started Flonase daily.  His cough is mildly improved.  It is intermittent, and there are several hours throughout the day where it does not cause problems.  His cough does not awaken him at night, but once he is awake he starts coughing.  He recently had conscious sedation for right heart catheterization, and noted that he did not cough at all.  He occasionally has thin sputum production, but not purulent sputum.  His cough has progressively worsened over time.  His heartburn has been better controlled since taking omeprazole, and this has improved his cough.  Intranasal steroids have improved his nasal congestion, but not change his cough.  He notices that he coughs more when his dialysate is instilled, and he always coughs after he drains.  He has had his seasonal flu shot this year.  He has had 3 parathyroid surgeries in the past, one most recently a few months ago.  He is following up with his surgeon soon due to having a weak voice, which he was warned may be a side effect of his surgery.  One of the  parathyroid glands that was resected was close to his jugular vein.  He does not note that his cough was new immediately after any 1 of these surgeries  OV 06/30/2019: Mr. Athey is a 63 year old gentleman with a history of polycystic kidney disease on peritoneal dialysis who presents for evaluation of cough for the last 3 to 4 months.  His cough is mostly dry, occasionally productive of sputum.  His his sputum is white or brown.  He describes his coughing episodes as severe and violent, sometimes making him feel as though he is about to vomit.  These episodes can happen at irregular intervals, sometimes hours apart.  He was treated with Ladona Ridgel and an antibiotic when this started, which did not resolve his symptoms.  He has had chest x-rays, which were unrevealing.  He does not have symptoms when sleeping  overnight, but does first thing in the morning and throughout the day.  He noticed some benefit when his fluid removal with dialysis was increased several weeks ago, but had no benefit when he was started on Advair, which she has subsequently stopped.  He has a history of heartburn which resolved on omeprazole, but his cough persisted.  He is not on an ACE inhibitor and has not been for a while.  He has no history of asthma, although his son has asthma.  There is no family history of chronic lung disease.  He denies fevers, chills, sweats, unplanned weight loss, change in appetite.  He has no seasonal allergies or rhinorrhea, but occasionally complains of postnasal drip.  He has tried azelastine in the past.  He is a never smoker/vaper.  He is undergoing evaluation for kidney transplant; he is having repeat heart catheterization to address a CTO in late October.    Past Medical History:  Diagnosis Date  . Allergy   . Anemia    low iron  . Chronic kidney disease    denies  . Coronary artery disease    02/06/19: Mild non-obstructive disease on left, 100% mid RCA with left-to-right collaterals.  No PCI. S/p attempted RCA PCI 04/2019. S/p DES pRCA with concern for dissection, stented through mRCA with good result 07/17/19. DAPT x 6 months rec. [facility])  . ESRD (end stage renal disease) ([place])    s/p LUE basilic vein transpositiono 2017; s/p peritoneal dialysis catheter 12/31/17  . Family history of kidney stone   . GERD (gastroesophageal reflux disease)    occ  . Gout   . Hypercalcemia   . Hyperlipidemia   . Hypertension    does not see a cardiologist  . Hypothyroidism   . Parathyroid adenoma   . Polycystic kidney disease   . Renal cancer ([place])    kidney  . Sleep apnea    no longer uses cpap, has lost weight  . Wears glasses      Family History  Problem Relation Age of Onset  . Polycystic kidney disease Mother   . Blindness Father        passed from [place]  . Asthma Child   . Colon cancer Neg Hx   . Rectal cancer Neg Hx   . Stomach cancer Neg Hx   . Liver cancer Neg Hx   . Esophageal cancer Neg Hx      Past Surgical History:  Procedure Laterality Date  . AV FISTULA PLACEMENT Left 03/30/2016   Procedure: FIRST STAGE BASILIC VEIN TRANSPOSITION LEFT UPPER ARM;  Surgeon: Serafina Mitchell, MD;  Location: [place];  Service: Vascular;  Laterality: Left;  . BASCILIC VEIN TRANSPOSITION Left 06/13/2016   Procedure: SECOND STAGE BASILIC VEIN TRANSPOSITION;  Surgeon: Serafina Mitchell, MD;  Location: [place];  Service: Vascular;  Laterality: Left;  . COLONOSCOPY    . ESOPHAGOGASTRODUODENOSCOPY (EGD) WITH PROPOFOL N/A 01/03/2018   Procedure: ESOPHAGOGASTRODUODENOSCOPY (EGD) WITH PROPOFOL;  Surgeon: Jackquline Denmark, MD;  Location: [facility] ENDOSCOPY;  Service: Endoscopy;  Laterality: N/A;  . MINIMALLY INVASIVE RADIOACTIVE PARATHYROIDECTOMY N/A 05/04/2013   Procedure: PARATHYROIDECTOMY MINIMALLY INVASIVE;  Surgeon: Harl Bowie, MD;  Location: [place];  Service: General;  Laterality: N/A;  . MINIMALLY INVASIVE RADIOACTIVE PARATHYROIDECTOMY N/A 10/20/2013   Procedure: MINIMALLY INVASIVE  PARATHYROIDECTOMY CONVERTED TO COMPLETE NECK EXPLORATION;  Surgeon: Harl Bowie, MD;  Location: [place];  Service: General;  Laterality: N/A;  . NASAL SEPTOPLASTY W/ TURBINOPLASTY    .  PARATHYROIDECTOMY  04/16/2019  . PARATHYROIDECTOMY N/A 04/16/2019   Procedure: NECK EXPLORATION AND PARATHYROIDECTOMY;  Surgeon: Armandina Gemma, MD;  Location: [place];  Service: General;  Laterality: N/A;  . ROBOT ASSISTED LAPAROSCOPIC NEPHRECTOMY Left 01/19/2015   Procedure: ROBOTIC ASSISTED LAPAROSCOPIC RADICAL NEPHRECTOMY AND INTRAOPERATIVE ULTRASOUND ;  Surgeon: Alexis Frock, MD;  Location: [facility];  Service: Urology;  Laterality: Left;  . THYROID LOBECTOMY Right 04/16/2019   Procedure: RIGHT THYROID LOBECTOMY;  Surgeon: Armandina Gemma, MD;  Location: [place];  Service: General;  Laterality: Right;  Marland Kitchen VASECTOMY      Social History   Socioeconomic History  . Marital status: Married    Spouse name: Not on file  . Number of children: 2  . Years of education: Not on file  . Highest education level: Not on file  Occupational History  . Occupation: customer service  Tobacco Use  . Smoking status: Never Smoker  . Smokeless tobacco: Never Used  Substance and Sexual Activity  . Alcohol use: Yes    Alcohol/week: 2.0 standard drinks    Types: 2 Glasses of wine per week    Comment: occasional  . Drug use: No  . Sexual activity: Not on file  Other Topics Concern  . Not on file  Social History Narrative   Patient is married, he has 2 children he works in Therapist, art   Occasional alcohol never smoker no drug use   Social Determinants of Radio broadcast assistant Strain:   . Difficulty of Paying Living Expenses:   Food Insecurity:   . Worried About Charity fundraiser in the Last Year:   . Arboriculturist in the Last Year:   Transportation Needs:   . Film/video editor (Medical):   Marland Kitchen Lack of Transportation (Non-Medical):   Physical Activity:   . Days of Exercise per Week:   . Minutes of  Exercise per Session:   Stress:   . Feeling of Stress :   Social Connections:   . Frequency of Communication with Friends and Family:   . Frequency of Social Gatherings with Friends and Family:   . Attends Religious Services:   . Active Member of Clubs or Organizations:   . Attends Archivist Meetings:   Marland Kitchen Marital Status:   Intimate Partner Violence:   . Fear of Current or Ex-Partner:   . Emotionally Abused:   Marland Kitchen Physically Abused:   . Sexually Abused:      Allergies  Allergen Reactions  . Ivp Dye [Iodinated Diagnostic Agents] Itching and Other (See Comments)    SKIN PEELS  . Other Itching  . Iodine-131 Itching and Rash     Immunization History  Administered Date(s) Administered  . Hepatitis B, adult 10/01/2019, 10/29/2019  . Influenza Split 07/09/2011, 08/11/2012  . Influenza Whole 07/26/2010  . Influenza,inj,Quad PF,6+ Mos 07/09/2011, 08/11/2012, 08/28/2013, 06/15/2019  . Influenza-Unspecified 08/28/2013  . PFIZER SARS-COV-2 Vaccination 11/07/2019, 12/02/2019  . Pneumococcal Conjugate-13 02/12/2018  . Pneumococcal Polysaccharide-23 01/20/2015  . Pneumococcal-Unspecified 02/12/2018  . Tdap 08/10/2011    Outpatient Medications Prior to Visit  Medication Sig Dispense Refill  . albuterol (VENTOLIN HFA) 108 (90 Base) MCG/ACT inhaler Inhale 2 puffs into the lungs every 6 (six) hours as needed for wheezing or shortness of breath (cough). 6.7 g 2  . allopurinol (ZYLOPRIM) 100 MG tablet Take 100 mg by mouth daily.    Marland Kitchen amLODipine (NORVASC) 10 MG tablet Take 10 mg by mouth daily.     Marland Kitchen  aspirin EC 81 MG tablet Take 81 mg by mouth daily.    Marland Kitchen b complex-vitamin c-folic acid (NEPHRO-VITE) 0.8 MG TABS tablet Take 1 tablet by mouth daily.  3  . BRILINTA 90 MG TABS tablet Take 90 mg by mouth 2 (two) times daily.     . calcitRIOL (ROCALTROL) 0.5 MCG capsule Take 2 capsules (1 mcg total) by mouth daily. 30 capsule 0  . ferric citrate (AURYXIA) 1 GM 210 MG(Fe) tablet Take 210  mg by mouth 3 (three) times daily with meals.    . fluticasone (FLONASE) 50 MCG/ACT nasal spray Place 1 spray into both nostrils daily as needed for allergies.     Marland Kitchen gentamicin cream (GARAMYCIN) 0.1 % Apply 1 application topically daily.    Marland Kitchen levothyroxine (SYNTHROID) 50 MCG tablet Take 50 mcg by mouth daily before breakfast.    . omeprazole (PRILOSEC) 20 MG capsule Take 20 mg by mouth daily in the afternoon.     . rosuvastatin (CRESTOR) 5 MG tablet Take 5 mg by mouth every Monday, Wednesday, and Friday.    . cephALEXin (KEFLEX) 500 MG capsule Take 1 capsule (500 mg total) by mouth 4 (four) times daily. 40 capsule 0   No facility-administered medications prior to visit.    Review of Systems  Constitutional: Negative for chills and fever.  HENT: Negative for congestion.   Respiratory: Positive for cough. Negative for hemoptysis and shortness of breath.   Cardiovascular: Negative for chest pain and leg swelling.  Gastrointestinal: Negative for heartburn, nausea and vomiting.  Endo/Heme/Allergies: Negative for environmental allergies.     Objective:   There were no vitals filed for this visit.   on  RA BMI Readings from Last 3 Encounters:  11/24/19 26.45 kg/m  11/13/19 27.26 kg/m  10/08/19 27.94 kg/m   Wt Readings from Last 3 Encounters:  11/24/19 195 lb (88.5 kg)  11/13/19 201 lb (91.2 kg)  10/08/19 206 lb (93.4 kg)    Physical Exam -limited due to televisit.  No conversational dyspnea, no observed coughing.  Answering questions appropriately.   CBC    Component Value Date/Time   WBC 16.8 (H) 11/24/2019 1804   RBC 3.45 (L) 11/24/2019 1804   HGB 10.5 (L) 11/24/2019 1804   HCT 33.8 (L) 11/24/2019 1804   PLT 226 11/24/2019 1804   MCV 98.0 11/24/2019 1804   MCH 30.4 11/24/2019 1804   MCHC 31.1 11/24/2019 1804   RDW 17.4 (H) 11/24/2019 1804   LYMPHSABS 0.6 (L) 11/24/2019 1804   MONOABS 0.7 11/24/2019 1804   EOSABS 0.0 11/24/2019 1804   BASOSABS 0.0 11/24/2019 1804    Micro: 07/01/2019 respiratory culture-normal flora 07/01/2019 sputum fungus-negative 07/01/2019 sputum AFB-negative  04/13/2019 Covid negative  Chest Imaging- films reviewed: CXR 06/17/2019, reviewed- increased opacification bilateral lower lobes, especially left lower lobe adjacent to the cardiac apex.  CT abdomen 12/2017-lower lobe scarring bilaterally  CT chest 07/03/2019-mild groundglass opacities bilateral bases.  Mild airway thickening.  Patulous esophagus.  No significant mediastinal or hilar adenopathy.  Tiny centrilobular nodules throughout.  CXR 10/14/19: Normal CXR, right IJ dialysis catheter  CT chest 01/06/2020- 30mm right lower lobe peripheral nodule unchanged.  Minimal biapical scarring.  RIJ dialysis catheter in place.  Three-vessel coronary atherosclerosis.  Pulmonary Functions Testing Results: No flowsheet data found.   Echocardiogram: LVEF 69%, no diastolic dysfunction, normal wall motion.  Normal RV.  Mildly dilated left atrium but normal right atrium.  Heart Catheterization August 2020: CTO RCA, able to be fully intervened upon.  Planning repeat cath in the future. 02/06/2019- nonobstructive coronary artery disease, 100% mid RCA CTO with left-to-right collaterals. Planning for repeat heart cath 07/17/2019  Assessment & Plan:     ICD-10-CM   1. Solitary pulmonary nodule  R91.1      RLL SPN -Reviewed CT scan results with him. No additional follow-up needed.   Chronic cough-improved after switching from PD to HD.  Likely the dialysate was causing irritation of his diaphragm and pleura.  Previously minimally improved on LABA-ICS, PPI, Flonase.  Not on an ACE inhibitor.   -Continue HD-appreciate nephrology's help.  Planning for PD catheter removal and fistula placement when able to come off brilinta.   RTC as needed.   Current Outpatient Medications:  .  albuterol (VENTOLIN HFA) 108 (90 Base) MCG/ACT inhaler, Inhale 2 puffs into the lungs every 6 (six) hours as  needed for wheezing or shortness of breath (cough)., Disp: 6.7 g, Rfl: 2 .  allopurinol (ZYLOPRIM) 100 MG tablet, Take 100 mg by mouth daily., Disp: , Rfl:  .  amLODipine (NORVASC) 10 MG tablet, Take 10 mg by mouth daily. , Disp: , Rfl:  .  aspirin EC 81 MG tablet, Take 81 mg by mouth daily., Disp: , Rfl:  .  b complex-vitamin c-folic acid (NEPHRO-VITE) 0.8 MG TABS tablet, Take 1 tablet by mouth daily., Disp: , Rfl: 3 .  BRILINTA 90 MG TABS tablet, Take 90 mg by mouth 2 (two) times daily. , Disp: , Rfl:  .  calcitRIOL (ROCALTROL) 0.5 MCG capsule, Take 2 capsules (1 mcg total) by mouth daily., Disp: 30 capsule, Rfl: 0 .  ferric citrate (AURYXIA) 1 GM 210 MG(Fe) tablet, Take 210 mg by mouth 3 (three) times daily with meals., Disp: , Rfl:  .  fluticasone (FLONASE) 50 MCG/ACT nasal spray, Place 1 spray into both nostrils daily as needed for allergies. , Disp: , Rfl:  .  gentamicin cream (GARAMYCIN) 0.1 %, Apply 1 application topically daily., Disp: , Rfl:  .  levothyroxine (SYNTHROID) 50 MCG tablet, Take 50 mcg by mouth daily before breakfast., Disp: , Rfl:  .  omeprazole (PRILOSEC) 20 MG capsule, Take 20 mg by mouth daily in the afternoon. , Disp: , Rfl:  .  rosuvastatin (CRESTOR) 5 MG tablet, Take 5 mg by mouth every Monday, Wednesday, and Friday., Disp: , Rfl:    Julian Hy, DO [facility] 01/26/2020 9:51 AM

## 2020-01-26 NOTE — Patient Instructions (Addendum)
Thank you for visiting Dr. Kensie Susman at [facility]. We recommend the following:   Return if symptoms worsen or fail to improve.    Please do your part to reduce the spread of COVID-19. ' 

## 2020-01-29 ENCOUNTER — Telehealth: Payer: Self-pay

## 2020-01-29 NOTE — Telephone Encounter (Signed)
Scheduled pt for surgery. Pre op instructions discussed. Pt has verbalized understanding.

## 2020-02-02 ENCOUNTER — Telehealth: Payer: Self-pay

## 2020-02-02 NOTE — Telephone Encounter (Signed)
Left message for Phillip Blair at Dr. Warden Fillers office to call us back to try to coordinate general surgery for PD cath to be removed at same time pt is in hospital for L BVT.

## 2020-02-08 ENCOUNTER — Encounter (HOSPITAL_COMMUNITY): Payer: Self-pay | Admitting: Surgery

## 2020-02-08 ENCOUNTER — Other Ambulatory Visit: Payer: Self-pay

## 2020-02-08 ENCOUNTER — Other Ambulatory Visit (HOSPITAL_COMMUNITY)
Admission: RE | Admit: 2020-02-08 | Discharge: 2020-02-08 | Disposition: A | Discharge status: Home / Self Care | Payer: Medicare Other | Source: Ambulatory Visit | Attending: Surgery | Admitting: Surgery

## 2020-02-08 DIAGNOSIS — Z01812 Encounter for preprocedural laboratory examination: Secondary | ICD-10-CM | POA: Insufficient documentation

## 2020-02-08 DIAGNOSIS — Z20822 Contact with and (suspected) exposure to covid-19: Secondary | ICD-10-CM | POA: Insufficient documentation

## 2020-02-08 LAB — SARS CORONAVIRUS 2 (TAT 6-24 HRS): SARS Coronavirus 2: NEGATIVE

## 2020-02-08 NOTE — Progress Notes (Signed)
Patient denies shortness of breath, fever, cough or chest pain.  PCP - Dr Ulanda Edison Cardiologist - Dr Ferdinand Lango GI - Dr Carlean Purl Yetta Barre, DO  Chest x-ray - 11/24/19 (2V) EKG - 07/17/19 CE, Requested Stress Test - 02/26/19 CE, 02/25/19 CE ECHO - 07/01/17 CE  Cardiac Cath - 05/11/19 CE - Requested  Sleep Study - Yes (05/2010) CPAP - Does not use CPAP, lost weight   Blood Thinner Instructions:  Last dose on Brilenta was on Friday, 02/05/20.  Anesthesia review: Yes  STOP now taking any Aspirin (unless otherwise instructed by your surgeon), Aleve, Naproxen, Ibuprofen, Motrin, Advil, Goody's, BC's, all herbal medications, fish oil, and all vitamins.   Coronavirus Screening Covid test on 02/08/20 is pending. Do you have any of the following symptoms:  Cough yes/no: No Fever (>100.7F)  yes/no: No Runny nose yes/no: No Sore throat yes/no: No Difficulty breathing/shortness of breath  yes/no: No  Have you traveled in the last 14 days and where? yes/no: No  Patient verbalized understanding of instructions that were given via phone.

## 2020-02-09 NOTE — Anesthesia Preprocedure Evaluation (Addendum)
Anesthesia Evaluation  Patient identified by MRN, date of birth, ID band Patient awake    Reviewed: Allergy & Precautions  Airway Mallampati: II  TM Distance: >3 FB     Dental   Pulmonary    breath sounds clear to auscultation       Cardiovascular hypertension, + CAD   Rhythm:Regular Rate:Normal     Neuro/Psych    GI/Hepatic Neg liver ROS, GERD  ,  Endo/Other  Hypothyroidism   Renal/GU Renal disease     Musculoskeletal   Abdominal   Peds  Hematology   Anesthesia Other Findings   Reproductive/Obstetrics                            Anesthesia Physical Anesthesia Plan  ASA: III  Anesthesia Plan: MAC   Post-op Pain Management:    Induction: Intravenous  PONV Risk Score and Plan: 2  Airway Management Planned: Nasal Cannula and Simple Face Mask  Additional Equipment:   Intra-op Plan:   Post-operative Plan:   Informed Consent: I have reviewed the patients History and Physical, chart, labs and discussed the procedure including the risks, benefits and alternatives for the proposed anesthesia with the patient or authorized representative who has indicated his/her understanding and acceptance.     Dental advisory given  Plan Discussed with: CRNA and Anesthesiologist  Anesthesia Plan Comments: (PAT note written 02/09/2020 by Myra Gianotti, PA-C. )      Anesthesia Quick Evaluation

## 2020-02-09 NOTE — Progress Notes (Signed)
Anesthesia Chart Review: Phillip Blair    Case: [id number] Date/Time: 02/10/20 0815   Procedure: LEFT STAGE BASCILIC VEIN TRANSPOSITION (Left )   Anesthesia type: Monitor Anesthesia Care   Pre-op diagnosis: END STAGE RENAL DISEASE   Location: [facility] OR ROOM 57 / [place] OR   Surgeons: Serafina Mitchell, MD      DISCUSSION: Patient is a 64 year old male scheduled for the above procedure. Procedure was initially scheduled for 10/21/19, but postponed to get him > 6 months from 07/17/19 DES RCA. Since then, patient had re-evaluation by cardiology last month with permission to hold Brilinta for 5 days. It appears he is also scheduled for removal of PD catheter on 02/11/20 in [facility] by Demetrius Revel, MD to minimize his time off of anti-platelet therapy.   History includes never smoker, HTN, HLD,CAD (occluded RCA with collaterals 01/2019; s/p DES proximal RCA with concern for dissection, stented through mid RCA with good angiographic result 07/17/19),OSA (no CPAPsince weight loss), clear cell renal carcinoma/polycystic kidney disease (s/p robotic assisted laparoscopic left radical nephrectomy 01/20/15), ESRD (left basilic vein transposition 2017; s/p peritoneal dialysis catheter 12/31/17 but changed to hemodialysis due to cough; on HD TTS), parathyroid adenoma/hypercalcemia (s/pparathyroidectomy 05/04/2013, 10/20/2013; s/p neck exploration, excision of right parathyroid adenoma, right thyroid lobectomy 04/16/19), GERD, anemia, GI bleed with ulcerative esophagitis (12/2017), hypothyroidism.  Last cardiology evaluation on 12/30/19 by Joylene Draft, PA-C. She wrote, "Ok to hold your Brilinta 5 days prior to your surgeries/procedures. I'd like your procedures to be scheduled on the same day or within 1-2 days of each other so you are holding the Brilinta for the shortest period of time. Once the procedures are over, please restart the Brilinta and continue until October of 2021."  Last Brilinta 02/05/20. 02/08/20  presurgical COVID-19 test was negative.   He is for i-STAT labs and anesthesia team evaluation on the day of surgery. 01/2020 EKG requested from [facility], but is still pending.   VS: For day of surgery   PROVIDERS: - [name], MD is PCP.  Barbaraann Boys, MD is cardiologist ([place]). Last evaluation 12/30/19 by Joylene Draft, PA-C. Noemi Chapel, DO is pulmonologist. Last evaluation 01/26/20 for follow-up cough and RLL pulmonary nodule. Cough had signifcantly improved after changing from PD to HD, so likely dialysate was causing irritation of his diaphragm and pleura. 01/06/20 follow-up CT showed stable lung nodule that was felt consistent with benign process. As needed pulmonology follow-up recommended.  Donato Heinz, MD is nephrologist. He was also evaluated by Kathrynn Running, PA-C with [facility] on 02/05/20.  Alexis Frock, MD is urologist ([facility]) - Silvano Rusk, MD is GI   LABS: He is for ISTAT labs prior to surgery. As of 11/24/19, H/H 10.5/33.8, glucose 111.   IMAGES: CXR 02/05/20 [facility]): FINDINGS:  Cardiovascular/mediastinum: Cardiopericardial silhouette and pulmonary vasculature within normal limits. Right IJ central venous catheter terminating over the superior cavoatrial junction. Lungs/pleura: No focal opacity. No discernible pleural effusion or pneumothorax. Upper abdomen: Visualized portions are unremarkable.  Chest wall/osseous structures: No acute osseous abnormality. Lower neck surgical clips. IMPRESSION: There is no evidence of acute cardiac or pulmonary abnormality.  CT abd/pelvis 02/05/20 [facility] CE): Result Impression: 1. The right kidney measures approximately 10.1 cm and contains numerous subcentimeter cysts, which are better characterized on comparison abdominal MRI from April 2020. 2. Mild calcific atherosclerosis of the distal aorta and common iliac arteries. No substantial  atherosclerotic disease within  the bilateral external iliac arteries.  3. 3. At least moderate coronary artery disease.  CT Chest 01/06/20: IMPRESSION: 1. Stable tiny right lower lobe pulmonary nodule, consistent with a benign finding. No new or enlarging nodules. 2. Coronary and Aortic Atherosclerosis (ICD10-I70.0). 3. 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   EKG:  EKG 02/06/20 [facility]): Tracing requested, but according to [facility] Care Everywhere Result Narrative: Ventricular Rate          79    BPM           Atrial Rate            79    BPM           P-R Interval            198    ms           QRS Duration            82    ms           Q-T Interval            382    ms           QTC                438    ms           P Axis               52    degrees         R Axis               17    degrees         T Axis               47    degrees           Sinus rhythm  Normal ECG   When compared with ECG of 17-Jul-2019 16:37,   No significant change was found  Confirmed by Lonni Fix ([id number]) on 02/06/2020 1:19:56 PM      CV: LLE venous US 09/23/19: IMPRESSION: 1. No evidence of DVT within the left lower extremity. 2. Indeterminate approximately 5.2 cm complex fluid collection with the subcutaneous tissues of the left calf - nonspecific with differential considerations including leaking Baker's cyst versus hematoma, less likely, seroma or abscess. Clinical correlation is advised.   PCI 07/17/19 [facility] CE): Planned PCI of CTO RCA. Bilateral femoral access (8 Fr destination in RFA and 8 Fr long sheath in  LFA).  8 Fr AL 0.75 in RCA and 8 Fr EBU 3.5 in LMCA Started with antegrade wire escalation strategy  using a Corsair delivered  with a BMW then Guia 2 Able to wire true to true, swapped back to a BMW to wire into PDA.  Stented Proximally with a 2.5x12 Synergy DES (concern for proximal guide  dissection).  Stented through the mid with a 2.5x38 post dilated everything with a 2.75  [place] to high pressures.  Good angiographic result.  DAPT with ASA and ticag ("dual antiplatelet therapy without interruption for 6 Months for the Drug Eluting Stent. ASA/ticagrelor" per 07/17/19 discharge summary)    Nuclear stress test 02/25/19 [facility]): IMPRESSION: 1. Decreased uptake along the inferior wall on the stress images. Findings are equivocal for reversibility. Inferior wall findings could be related to diaphragmatic attenuation but uncertain. 2. Normal left ventricular wall motion. 3. Left ventricular ejection fraction is 71%. 4. Non  invasive risk stratification*: Low 2012 Appropriate Use Criteria for Coronary Revascularization Focused Update: J Am Coll Cardiol. 8676;19(5):093-267. http://content.airportbarriers.com.aspx?articleid=1201161   Cardiac cath 02/06/19 [facility]): Result Narrative Non-obstructive coronary artery disease. LCP for positive stress test (LV dilation post stress) as part of renal  transplant workup. Of note, patient not having chest pain or dyspnea on  exertion.  Right radial artery  Mild non obstructive disease on left side.  100% mid RCA with left to right collaterals.  No indication for PCI at this time.    Echo 11/27/18 [facility] CE): SUMMARY The left ventricular size is normal. Mild left ventricular hypertrophy Left ventricular systolic function is normal. LV ejection fraction = 60-65%. Left ventricular filling pattern is prolonged relaxation. The right ventricle is normal size. The right ventricular systolic function is normal. The left atrium is mildly dilated. There is mild mitral regurgitation. No significant stenosis seen There was insufficient  TR detected to calculate RV systolic pressure. Estimated right atrial pressure is 5 mmHg.Marland Kitchen There is no pericardial effusion. Probably no significant change in comparison with the prior study noted   Carotid Duplex 11/27/18 [facility]; scanned under Media tab, Correspondence, 04/16/19): Impression: Right: Less than 21% diameter reduction of the right proximal ICA. No evidence of hemodynamically significant ICA stenosis. The right vertebral artery flow is antegrade. Left: Less than 21% diameter reduction of the left proximal ICA. No evidence of hemodynamically significant ICA stenosis. The left vertebral artery flow is antegrade.   Past Medical History:  Diagnosis Date  . Allergy   . Anemia    low iron  . Chronic kidney disease    denies  . Coronary artery disease    02/06/19: Mild non-obstructive disease on left, 100% mid RCA with left-to-right collaterals. No PCI. S/p attempted RCA PCI 04/2019. S/p DES pRCA with concern for dissection, stented through mRCA with good result 07/17/19. DAPT x 6 months rec. [facility])  . ESRD (end stage renal disease) ([place])    s/p LUE basilic vein transpositiono 2017; s/p peritoneal dialysis catheter 12/31/17  . Family history of kidney stone   . GERD (gastroesophageal reflux disease)    occ  . Gout   . History of kidney stones    passed stone , no surgery  . Hypercalcemia   . Hyperlipidemia   . Hypertension    does not see a cardiologist  . Hypothyroidism   . Parathyroid adenoma   . Polycystic kidney disease   . Renal cancer ([place])    kidney  . Sleep apnea    no longer uses cpap, has lost weight  . Wears glasses     Past Surgical History:  Procedure Laterality Date  . AV FISTULA PLACEMENT Left 03/30/2016   Procedure: FIRST STAGE BASILIC VEIN TRANSPOSITION LEFT UPPER ARM;  Surgeon: Serafina Mitchell, MD;  Location: [place];  Service: Vascular;  Laterality: Left;  . BASCILIC VEIN TRANSPOSITION Left 06/13/2016   Procedure: SECOND STAGE BASILIC VEIN  TRANSPOSITION;  Surgeon: Serafina Mitchell, MD;  Location: [place];  Service: Vascular;  Laterality: Left;  . CARDIAC CATHETERIZATION  05/11/2019   [facility]  . COLONOSCOPY    . ESOPHAGOGASTRODUODENOSCOPY (EGD) WITH PROPOFOL N/A 01/03/2018   Procedure: ESOPHAGOGASTRODUODENOSCOPY (EGD) WITH PROPOFOL;  Surgeon: Jackquline Denmark, MD;  Location: [facility] ENDOSCOPY;  Service: Endoscopy;  Laterality: N/A;  . HERNIA REPAIR    . MINIMALLY INVASIVE RADIOACTIVE PARATHYROIDECTOMY N/A 05/04/2013   Procedure: PARATHYROIDECTOMY MINIMALLY INVASIVE;  Surgeon: Harl Bowie, MD;  Location: [place];  Service:  General;  Laterality: N/A;  . MINIMALLY INVASIVE RADIOACTIVE PARATHYROIDECTOMY N/A 10/20/2013   Procedure: MINIMALLY INVASIVE PARATHYROIDECTOMY CONVERTED TO COMPLETE NECK EXPLORATION;  Surgeon: Harl Bowie, MD;  Location: [place];  Service: General;  Laterality: N/A;  . NASAL SEPTOPLASTY W/ TURBINOPLASTY    . PARATHYROIDECTOMY  04/16/2019  . PARATHYROIDECTOMY N/A 04/16/2019   Procedure: NECK EXPLORATION AND PARATHYROIDECTOMY;  Surgeon: Armandina Gemma, MD;  Location: [place];  Service: General;  Laterality: N/A;  . ROBOT ASSISTED LAPAROSCOPIC NEPHRECTOMY Left 01/19/2015   Procedure: ROBOTIC ASSISTED LAPAROSCOPIC RADICAL NEPHRECTOMY AND INTRAOPERATIVE ULTRASOUND ;  Surgeon: Alexis Frock, MD;  Location: [facility];  Service: Urology;  Laterality: Left;  . THYROID LOBECTOMY Right 04/16/2019   Procedure: RIGHT THYROID LOBECTOMY;  Surgeon: Armandina Gemma, MD;  Location: [place];  Service: General;  Laterality: Right;  Marland Kitchen VASECTOMY      MEDICATIONS: No current facility-administered medications for this encounter.   Marland Kitchen albuterol (VENTOLIN HFA) 108 (90 Base) MCG/ACT inhaler  . allopurinol (ZYLOPRIM) 100 MG tablet  . amLODipine (NORVASC) 10 MG tablet  . aspirin EC 81 MG tablet  . b complex-vitamin c-folic acid (NEPHRO-VITE) 0.8 MG TABS tablet  . BRILINTA 90 MG TABS tablet  . ferric citrate (AURYXIA) 1 GM 210 MG(Fe)  tablet  . fluticasone (FLONASE) 50 MCG/ACT nasal spray  . gentamicin cream (GARAMYCIN) 0.1 %  . levothyroxine (SYNTHROID) 50 MCG tablet  . nitroGLYCERIN (NITROSTAT) 0.4 MG SL tablet  . omeprazole (PRILOSEC) 20 MG capsule  . rosuvastatin (CRESTOR) 5 MG tablet    Myra Gianotti, PA-C Surgical Short Stay/Anesthesiology [facility] Phone [phone] [facility] Phone [phone] 02/09/2020 12:52 PM

## 2020-02-10 ENCOUNTER — Encounter (HOSPITAL_COMMUNITY): Payer: Self-pay | Admitting: Surgery

## 2020-02-10 ENCOUNTER — Encounter (HOSPITAL_COMMUNITY)
Admission: RE | Disposition: A | Discharge status: Home / Self Care | Payer: Self-pay | Source: Ambulatory Visit | Attending: Surgery

## 2020-02-10 ENCOUNTER — Ambulatory Visit (HOSPITAL_COMMUNITY): Payer: Medicare Other | Admitting: Physician Assistant

## 2020-02-10 ENCOUNTER — Other Ambulatory Visit: Payer: Self-pay

## 2020-02-10 ENCOUNTER — Ambulatory Visit (HOSPITAL_COMMUNITY)
Admission: RE | Admit: 2020-02-10 | Discharge: 2020-02-10 | Disposition: A | Discharge status: Home / Self Care | Payer: Medicare Other | Source: Ambulatory Visit | Attending: Surgery | Admitting: Surgery

## 2020-02-10 DIAGNOSIS — Q613 Polycystic kidney, unspecified: Secondary | ICD-10-CM | POA: Diagnosis not present

## 2020-02-10 DIAGNOSIS — I2510 Atherosclerotic heart disease of native coronary artery without angina pectoris: Secondary | ICD-10-CM | POA: Diagnosis not present

## 2020-02-10 DIAGNOSIS — Z7982 Long term (current) use of aspirin: Secondary | ICD-10-CM | POA: Insufficient documentation

## 2020-02-10 DIAGNOSIS — Z955 Presence of coronary angioplasty implant and graft: Secondary | ICD-10-CM | POA: Insufficient documentation

## 2020-02-10 DIAGNOSIS — Y9389 Activity, other specified: Secondary | ICD-10-CM | POA: Diagnosis not present

## 2020-02-10 DIAGNOSIS — M109 Gout, unspecified: Secondary | ICD-10-CM | POA: Insufficient documentation

## 2020-02-10 DIAGNOSIS — Z992 Dependence on renal dialysis: Secondary | ICD-10-CM | POA: Diagnosis not present

## 2020-02-10 DIAGNOSIS — Z87442 Personal history of urinary calculi: Secondary | ICD-10-CM | POA: Diagnosis not present

## 2020-02-10 DIAGNOSIS — T82898A Other specified complication of vascular prosthetic devices, implants and grafts, initial encounter: Secondary | ICD-10-CM | POA: Insufficient documentation

## 2020-02-10 DIAGNOSIS — Z905 Acquired absence of kidney: Secondary | ICD-10-CM | POA: Diagnosis not present

## 2020-02-10 DIAGNOSIS — I120 Hypertensive chronic kidney disease with stage 5 chronic kidney disease or end stage renal disease: Secondary | ICD-10-CM | POA: Diagnosis present

## 2020-02-10 DIAGNOSIS — I700 Atherosclerosis of aorta: Secondary | ICD-10-CM | POA: Insufficient documentation

## 2020-02-10 DIAGNOSIS — N186 End stage renal disease: Secondary | ICD-10-CM | POA: Diagnosis not present

## 2020-02-10 DIAGNOSIS — Z85528 Personal history of other malignant neoplasm of kidney: Secondary | ICD-10-CM | POA: Insufficient documentation

## 2020-02-10 DIAGNOSIS — Z841 Family history of disorders of kidney and ureter: Secondary | ICD-10-CM | POA: Insufficient documentation

## 2020-02-10 DIAGNOSIS — X58XXXA Exposure to other specified factors, initial encounter: Secondary | ICD-10-CM | POA: Diagnosis not present

## 2020-02-10 DIAGNOSIS — K219 Gastro-esophageal reflux disease without esophagitis: Secondary | ICD-10-CM | POA: Diagnosis not present

## 2020-02-10 DIAGNOSIS — Z79899 Other long term (current) drug therapy: Secondary | ICD-10-CM | POA: Diagnosis not present

## 2020-02-10 DIAGNOSIS — G4730 Sleep apnea, unspecified: Secondary | ICD-10-CM | POA: Diagnosis not present

## 2020-02-10 DIAGNOSIS — E892 Postprocedural hypoparathyroidism: Secondary | ICD-10-CM | POA: Diagnosis not present

## 2020-02-10 DIAGNOSIS — E890 Postprocedural hypothyroidism: Secondary | ICD-10-CM | POA: Diagnosis not present

## 2020-02-10 DIAGNOSIS — E785 Hyperlipidemia, unspecified: Secondary | ICD-10-CM | POA: Insufficient documentation

## 2020-02-10 HISTORY — DX: Personal history of urinary calculi: Z87.442

## 2020-02-10 HISTORY — PX: BASCILIC VEIN TRANSPOSITION: SHX5742

## 2020-02-10 LAB — POCT I-STAT, CHEM 8
BUN: 44 mg/dL — ABNORMAL HIGH (ref 8–23)
Calcium, Ion: 1.05 mmol/L — ABNORMAL LOW (ref 1.15–1.40)
Chloride: 101 mmol/L (ref 98–111)
Creatinine, Ser: 7.8 mg/dL — ABNORMAL HIGH (ref 0.61–1.24)
Glucose, Bld: 94 mg/dL (ref 70–99)
HCT: 40 % (ref 39.0–52.0)
Hemoglobin: 13.6 g/dL (ref 13.0–17.0)
Potassium: 5 mmol/L (ref 3.5–5.1)
Sodium: 137 mmol/L (ref 135–145)
TCO2: 30 mmol/L (ref 22–32)

## 2020-02-10 LAB — PROTIME-INR
INR: 1 (ref 0.8–1.2)
Prothrombin Time: 12.4 seconds (ref 11.4–15.2)

## 2020-02-10 SURGERY — TRANSPOSITION, VEIN, BASILIC
Anesthesia: Monitor Anesthesia Care | Site: Arm Upper | Laterality: Left

## 2020-02-10 MED ORDER — PHENYLEPHRINE 40 MCG/ML (10ML) SYRINGE FOR IV PUSH (FOR BLOOD PRESSURE SUPPORT)
PREFILLED_SYRINGE | INTRAVENOUS | Status: AC
  Filled 2020-02-10: qty 10

## 2020-02-10 MED ORDER — FENTANYL CITRATE (PF) 100 MCG/2ML IJ SOLN: 25.0000 ug | INTRAMUSCULAR | Status: DC | PRN

## 2020-02-10 MED ORDER — FENTANYL CITRATE (PF) 250 MCG/5ML IJ SOLN
INTRAMUSCULAR | Status: AC
  Filled 2020-02-10: qty 5

## 2020-02-10 MED ORDER — DEXAMETHASONE SODIUM PHOSPHATE 4 MG/ML IJ SOLN
INTRAMUSCULAR | Status: DC | PRN
  Administered 2020-02-10: 5 mg via INTRAVENOUS

## 2020-02-10 MED ORDER — PROPOFOL 10 MG/ML IV BOLUS
INTRAVENOUS | Status: AC
  Filled 2020-02-10: qty 20

## 2020-02-10 MED ORDER — SODIUM CHLORIDE 0.9 % IV SOLN
INTRAVENOUS | Status: AC
  Filled 2020-02-10: qty 1.2

## 2020-02-10 MED ORDER — CHLORHEXIDINE GLUCONATE 4 % EX LIQD: 60.0000 mL | Freq: Once | CUTANEOUS | Status: DC

## 2020-02-10 MED ORDER — PROPOFOL 10 MG/ML IV BOLUS
INTRAVENOUS | Status: DC | PRN
  Administered 2020-02-10: 150 mg via INTRAVENOUS

## 2020-02-10 MED ORDER — DEXAMETHASONE SODIUM PHOSPHATE 10 MG/ML IJ SOLN
INTRAMUSCULAR | Status: AC
  Filled 2020-02-10: qty 2

## 2020-02-10 MED ORDER — 0.9 % SODIUM CHLORIDE (POUR BTL) OPTIME
TOPICAL | Status: DC | PRN
  Administered 2020-02-10: 1000 mL

## 2020-02-10 MED ORDER — ALBUMIN HUMAN 5 % IV SOLN
INTRAVENOUS | Status: DC | PRN
Start: 2020-02-10 — End: 2020-02-10

## 2020-02-10 MED ORDER — PHENYLEPHRINE HCL-NACL 10-0.9 MG/250ML-% IV SOLN
INTRAVENOUS | Status: DC | PRN
  Administered 2020-02-10: 25 ug/min via INTRAVENOUS

## 2020-02-10 MED ORDER — BUPIVACAINE HCL (PF) 0.5 % IJ SOLN
INTRAMUSCULAR | Status: AC
  Filled 2020-02-10: qty 30

## 2020-02-10 MED ORDER — CEFAZOLIN SODIUM-DEXTROSE 2-4 GM/100ML-% IV SOLN
2.0000 g | INTRAVENOUS | Status: AC
  Administered 2020-02-10: 2 g via INTRAVENOUS
  Filled 2020-02-10: qty 100

## 2020-02-10 MED ORDER — LIDOCAINE-EPINEPHRINE (PF) 1 %-1:200000 IJ SOLN
INTRAMUSCULAR | Status: AC
  Filled 2020-02-10: qty 30

## 2020-02-10 MED ORDER — PROPOFOL 500 MG/50ML IV EMUL
INTRAVENOUS | Status: DC | PRN
  Administered 2020-02-10: 125 ug/kg/min via INTRAVENOUS

## 2020-02-10 MED ORDER — MIDAZOLAM HCL 5 MG/5ML IJ SOLN
INTRAMUSCULAR | Status: DC | PRN
  Administered 2020-02-10: 2 mg via INTRAVENOUS

## 2020-02-10 MED ORDER — ONDANSETRON HCL 4 MG/2ML IJ SOLN
INTRAMUSCULAR | Status: DC | PRN
  Administered 2020-02-10: 4 mg via INTRAVENOUS

## 2020-02-10 MED ORDER — EPHEDRINE SULFATE 50 MG/ML IJ SOLN
INTRAMUSCULAR | Status: DC | PRN
  Administered 2020-02-10: 5 mg via INTRAVENOUS

## 2020-02-10 MED ORDER — ROCURONIUM BROMIDE 10 MG/ML (PF) SYRINGE
PREFILLED_SYRINGE | INTRAVENOUS | Status: AC
  Filled 2020-02-10: qty 10

## 2020-02-10 MED ORDER — ONDANSETRON HCL 4 MG/2ML IJ SOLN
INTRAMUSCULAR | Status: AC
  Filled 2020-02-10: qty 4

## 2020-02-10 MED ORDER — FENTANYL CITRATE (PF) 100 MCG/2ML IJ SOLN
INTRAMUSCULAR | Status: DC | PRN
  Administered 2020-02-10: 50 ug via INTRAVENOUS
  Administered 2020-02-10: 25 ug via INTRAVENOUS

## 2020-02-10 MED ORDER — BUPIVACAINE LIPOSOME 1.3 % IJ SUSP
20.0000 mL | Freq: Once | INTRAMUSCULAR | Status: DC
  Filled 2020-02-10: qty 20

## 2020-02-10 MED ORDER — HEPARIN SODIUM (PORCINE) 1000 UNIT/ML IJ SOLN
INTRAMUSCULAR | Status: AC
  Filled 2020-02-10: qty 1

## 2020-02-10 MED ORDER — HEMOSTATIC AGENTS (NO CHARGE) OPTIME
TOPICAL | Status: DC | PRN
  Administered 2020-02-10: 1 via TOPICAL

## 2020-02-10 MED ORDER — SODIUM CHLORIDE 0.9 % IV SOLN: INTRAVENOUS | Status: DC

## 2020-02-10 MED ORDER — MIDAZOLAM HCL 2 MG/2ML IJ SOLN
INTRAMUSCULAR | Status: AC
  Filled 2020-02-10: qty 2

## 2020-02-10 MED ORDER — HYDROCODONE-ACETAMINOPHEN 5-325 MG PO TABS: 1.0000 | ORAL_TABLET | Freq: Four times a day (QID) | ORAL | 0 refills | Status: DC | PRN

## 2020-02-10 MED ORDER — BUPIVACAINE LIPOSOME 1.3 % IJ SUSP
INTRAMUSCULAR | Status: DC | PRN
  Administered 2020-02-10: 80 mL

## 2020-02-10 MED ORDER — SODIUM CHLORIDE 0.9 % IV SOLN
INTRAVENOUS | Status: DC | PRN
  Administered 2020-02-10: 500 mL

## 2020-02-10 MED ORDER — EPHEDRINE 5 MG/ML INJ
INTRAVENOUS | Status: AC
  Filled 2020-02-10: qty 10

## 2020-02-10 SURGICAL SUPPLY — 41 items
ADH SKN CLS APL DERMABOND .7 (GAUZE/BANDAGES/DRESSINGS) ×1
ARMBAND PINK RESTRICT EXTREMIT (MISCELLANEOUS) ×2 IMPLANT
BNDG ELASTIC 4X5.8 VLCR STR LF (GAUZE/BANDAGES/DRESSINGS) ×1 IMPLANT
CANISTER SUCT 3000ML PPV (MISCELLANEOUS) ×2 IMPLANT
CATH EMB 3FR 80CM (CATHETERS) ×1 IMPLANT
CLIP VESOCCLUDE MED 24/CT (CLIP) IMPLANT
CLIP VESOCCLUDE MED 6/CT (CLIP) IMPLANT
CLIP VESOCCLUDE SM WIDE 24/CT (CLIP) IMPLANT
CLIP VESOCCLUDE SM WIDE 6/CT (CLIP) IMPLANT
COVER PROBE W GEL 5X96 (DRAPES) ×2 IMPLANT
COVER WAND RF STERILE (DRAPES) ×2 IMPLANT
DERMABOND ADVANCED (GAUZE/BANDAGES/DRESSINGS) ×1
DERMABOND ADVANCED .7 DNX12 (GAUZE/BANDAGES/DRESSINGS) ×1 IMPLANT
ELECT REM PT RETURN 9FT ADLT (ELECTROSURGICAL) ×2
ELECTRODE REM PT RTRN 9FT ADLT (ELECTROSURGICAL) ×1 IMPLANT
GLOVE BIOGEL PI IND STRL 7.5 (GLOVE) ×1 IMPLANT
GLOVE BIOGEL PI INDICATOR 7.5 (GLOVE) ×1
GLOVE SURG SS PI 7.5 STRL IVOR (GLOVE) ×2 IMPLANT
GOWN STRL REUS W/ TWL LRG LVL3 (GOWN DISPOSABLE) ×2 IMPLANT
GOWN STRL REUS W/ TWL XL LVL3 (GOWN DISPOSABLE) ×1 IMPLANT
GOWN STRL REUS W/TWL LRG LVL3 (GOWN DISPOSABLE) ×4
GOWN STRL REUS W/TWL XL LVL3 (GOWN DISPOSABLE) ×2
HEMOSTAT SNOW SURGICEL 2X4 (HEMOSTASIS) ×1 IMPLANT
KIT BASIN OR (CUSTOM PROCEDURE TRAY) ×2 IMPLANT
KIT TURNOVER KIT B (KITS) ×2 IMPLANT
NS IRRIG 1000ML POUR BTL (IV SOLUTION) ×2 IMPLANT
PACK CV ACCESS (CUSTOM PROCEDURE TRAY) ×2 IMPLANT
PAD ARMBOARD 7.5X6 YLW CONV (MISCELLANEOUS) ×4 IMPLANT
SPONGE LAP 18X18 X RAY DECT (DISPOSABLE) ×1 IMPLANT
SUT PROLENE 5 0 C 1 36 (SUTURE) ×2 IMPLANT
SUT PROLENE 6 0 CC (SUTURE) ×2 IMPLANT
SUT SILK 2 0 SH (SUTURE) IMPLANT
SUT VIC AB 3-0 SH 27 (SUTURE) ×4
SUT VIC AB 3-0 SH 27X BRD (SUTURE) ×1 IMPLANT
SUT VICRYL 4-0 PS2 18IN ABS (SUTURE) ×3 IMPLANT
SYR 3ML LL SCALE MARK (SYRINGE) ×1 IMPLANT
SYR CONTROL 10ML LL (SYRINGE) ×1 IMPLANT
SYR TB 1ML LUER SLIP (SYRINGE) ×2 IMPLANT
TOWEL GREEN STERILE (TOWEL DISPOSABLE) ×2 IMPLANT
UNDERPAD 30X36 HEAVY ABSORB (UNDERPADS AND DIAPERS) ×2 IMPLANT
WATER STERILE IRR 1000ML POUR (IV SOLUTION) ×2 IMPLANT

## 2020-02-10 NOTE — Op Note (Signed)
    Patient name: Phillip Blair MRN: [id number] DOB: 07/12/1956 Sex: male  02/10/2020 Pre-operative Diagnosis: ESRD Post-operative diagnosis:  Same Surgeon:  Annamarie Major Assistants:  Laurence Slate Procedure:   Revision of left basilic vein fistula creation Anesthesia: General Blood Loss: Minimal Specimens: None  Findings: Excellent caliber vein.  This was fully mobilized from the axilla to the antecubital crease and then divided near the antecubital crease.  A separate subcutaneous tunnel was created and a end-to-end anastomosis was performed.  Indications: The patient has previously undergone a second stage left basilic vein fistula.  This was too medial for access and so he comes in today for revision  Procedure:  The patient was identified in the holding area and taken to [place] [facility]  The patient was then placed supine on the table. general anesthesia was administered.  The patient was prepped and draped in the usual sterile fashion.  A time out was called and antibiotics were administered.  Exparel was used for local anesthesia.  The patient's previous 2 longitudinal left upper arm incisions were opened with cautery.  Sharp dissection was used to fully isolate the basilic vein from the antecubital crease up to the axilla.  Once it was fully mobilized it was marked for orientation.  A subcutaneous tunnel was then created from the antecubital crease to the axilla going more lateral in the arm.  Baby Gregory clamps were placed proximally distally and the vein was ligated into the antecubital crease.  It was then brought through the previously created tunnel.  Where I had divided the vein there was a hypertrophic valve which was fully excised.  A primary end-to-end anastomosis was then performed with running 5-0 Prolene.  The appropriate flushing maneuvers were performed and the anastomosis was completed.  There was an excellent thrill within the fistula.  The wound was then copiously  irrigated.  The incision was closed with a layer with 3 oh followed by 4-0 Vicryl.  Dermabond was applied.  There were no immediate complications.   Disposition: To PACU stable.   Theotis Burrow, M.D., [facility] [place] Office: [phone] Pager:  [phone]

## 2020-02-10 NOTE — Discharge Instructions (Signed)
° °  [facility] [place] ° °Discharge Instructions ° °AV Fistula or Graft Surgery for Dialysis Access ° °Please refer to the following instructions for your post-procedure care. Your surgeon or physician assistant will discuss any changes with you. ° °Activity ° °You may drive the day following your surgery, if you are comfortable and no longer taking prescription pain medication. Resume full activity as the soreness in your incision resolves. ° °Bathing/Showering ° °You may shower after you go home. Keep your incision dry for 48 hours. Do not soak in a bathtub, hot tub, or swim until the incision heals completely. You may not shower if you have a hemodialysis catheter. ° °Incision Care ° °Clean your incision with mild soap and water after 48 hours. Pat the area dry with a clean towel. You do not need a bandage unless otherwise instructed. Do not apply any ointments or creams to your incision. You may have skin glue on your incision. Do not peel it off. It will come off on its own in about one week. Your arm may swell a bit after surgery. To reduce swelling use pillows to elevate your arm so it is above your heart. Your doctor will tell you if you need to lightly wrap your arm with an ACE bandage. ° °Diet ° °Resume your normal diet. There are not special food restrictions following this procedure. In order to heal from your surgery, it is CRITICAL to get adequate nutrition. Your body requires vitamins, minerals, and protein. Vegetables are the best source of vitamins and minerals. Vegetables also provide the perfect balance of protein. Processed food has little nutritional value, so try to avoid this. ° °Medications ° °Resume taking all of your medications. If your incision is causing pain, you may take over-the counter pain relievers such as acetaminophen (Tylenol). If you were prescribed a stronger pain medication, please be aware these medications can cause nausea and constipation. Prevent  nausea by taking the medication with a snack or meal. Avoid constipation by drinking plenty of fluids and eating foods with high amount of fiber, such as fruits, vegetables, and grains. Do not take Tylenol if you are taking prescription pain medications. ° ° ° ° °Follow up °Your surgeon may want to see you in the office following your access surgery. If so, this will be arranged at the time of your surgery. ° °Please call us immediately for any of the following conditions: ° °Increased pain, redness, drainage (pus) from your incision site °Fever of 101 degrees or higher °Severe or worsening pain at your incision site °Hand pain or numbness. ° °Reduce your risk of vascular disease: ° °Stop smoking. If you would like help, call [facility] at 1-800-QUIT-NOW (1-800-784-8669) or [place] at 336-586-4000 ° °Manage your cholesterol °Maintain a desired weight °Control your diabetes °Keep your blood pressure down ° °Dialysis ° °It will take several weeks to several months for your new dialysis access to be ready for use. Your surgeon will determine when it is OK to use it. Your nephrologist will continue to direct your dialysis. You can continue to use your Permcath until your new access is ready for use. ° °If you have any questions, please call the office at [phone]. ° °

## 2020-02-10 NOTE — H&P (Signed)
   Patient name: Phillip Blair MRN: [id number] DOB: 05/02/1956 Sex: male    HISTORY OF PRESENT ILLNESS:   Phillip Blair is a 64 y.o. male who is s/p left BVT which is too medial and he needs it moved  CURRENT MEDICATIONS:    Current Facility-Administered Medications  Medication Dose Route Frequency Provider Last Rate Last Admin  . 0.9 %  sodium chloride infusion   Intravenous Continuous Serafina Mitchell, MD      . ceFAZolin (ANCEF) IVPB 2g/100 mL premix  2 g Intravenous 30 min Pre-Op Serafina Mitchell, MD      . chlorhexidine (HIBICLENS) 4 % liquid 4 application  60 mL Topical Once Serafina Mitchell, MD       And  . Derrill Memo ON 02/11/2020] chlorhexidine (HIBICLENS) 4 % liquid 4 application  60 mL Topical Once Serafina Mitchell, MD        REVIEW OF SYSTEMS:   [X]  denotes positive finding, [ ]  denotes negative finding Cardiac  Comments:  Chest pain or chest pressure:    Shortness of breath upon exertion:    Short of breath when lying flat:    Irregular heart rhythm:    Constitutional    Fever or chills:      PHYSICAL EXAM:   Vitals:   02/10/20 0626  BP: 129/73  Pulse: 72  Resp: 17  Temp: 98.5 F (36.9 C)  TempSrc: Oral  SpO2: 95%  Weight: 90.7 kg  Height: 6' (1.829 m)    GENERAL: The patient is a well-nourished male, in no acute distress. The vital signs are documented above. CARDIOVASCULAR: There is a regular rate and rhythm. PULMONARY: Non-labored respirations Excellent thrill in fistula  STUDIES:   none   MEDICAL ISSUES:   Discussed relocating fistula.  Details of procedure discussed as well as risks and benefits  Leia Alf, MD, FACS [facility] [phone] Pager [phone]

## 2020-02-10 NOTE — Transfer of Care (Signed)
Immediate Anesthesia Transfer of Care Note  Patient: Phillip Blair  Procedure(s) Performed: Revision of LEFT STAGE [place]. (Left Arm Upper)  Patient Location: PACU  Anesthesia Type:General  Level of Consciousness: awake, alert , oriented and patient cooperative  Airway & Oxygen Therapy: Patient Spontanous Breathing  Post-op Assessment: Report given to RN and Post -op Vital signs reviewed and stable  Post vital signs: Reviewed and stable  Last Vitals:  Vitals Value Taken Time  BP 120/80 02/10/20 1050  Temp 36.7 C 02/10/20 1050  Pulse 64 02/10/20 1050  Resp 9 02/10/20 1050  SpO2 96 % 02/10/20 1050  Vitals shown include unvalidated device data.  Last Pain:  Vitals:   02/10/20 1050  TempSrc:   PainSc: 0-No pain         Complications: No apparent anesthesia complications

## 2020-02-10 NOTE — Anesthesia Postprocedure Evaluation (Signed)
Anesthesia Post Note  Patient: Phillip Blair  Procedure(s) Performed: Revision of LEFT STAGE [place]. (Left Arm Upper)     Patient location during evaluation: PACU Anesthesia Type: MAC and General Level of consciousness: awake Pain management: pain level controlled Vital Signs Assessment: post-procedure vital signs reviewed and stable Respiratory status: spontaneous breathing Cardiovascular status: stable Postop Assessment: no apparent nausea or vomiting Anesthetic complications: no    Last Vitals:  Vitals:   02/10/20 1044 02/10/20 1050  BP: 116/72 120/80  Pulse: 62 64  Resp: 12 16  Temp:  36.7 C  SpO2: 98% 96%    Last Pain:  Vitals:   02/10/20 1050  TempSrc:   PainSc: 0-No pain                 Lubna Stegeman

## 2020-02-10 NOTE — Anesthesia Procedure Notes (Addendum)
Procedure Name: LMA Insertion Date/Time: 02/10/2020 9:02 AM Performed by: Oletta Lamas, CRNA Pre-anesthesia Checklist: Patient identified, Emergency Drugs available, Suction available and Patient being monitored Patient Re-evaluated:Patient Re-evaluated prior to induction Oxygen Delivery Method: Circle System Utilized Preoxygenation: Pre-oxygenation with 100% oxygen Induction Type: IV induction LMA: LMA inserted LMA Size: 5.0 Number of attempts: 1 Airway Equipment and Method: Bite block Placement Confirmation: positive ETCO2 Tube secured with: Tape Dental Injury: Teeth and Oropharynx as per pre-operative assessment

## 2020-02-29 ENCOUNTER — Other Ambulatory Visit: Payer: Self-pay

## 2020-02-29 ENCOUNTER — Ambulatory Visit (INDEPENDENT_AMBULATORY_CARE_PROVIDER_SITE_OTHER): Payer: Self-pay | Admitting: Physician Assistant

## 2020-02-29 VITALS — BP 125/69 | HR 70 | Temp 97.7°F | Resp 18 | Ht 72.0 in | Wt 204.0 lb

## 2020-02-29 DIAGNOSIS — Z992 Dependence on renal dialysis: Secondary | ICD-10-CM

## 2020-02-29 DIAGNOSIS — N186 End stage renal disease: Secondary | ICD-10-CM

## 2020-02-29 NOTE — Progress Notes (Signed)
  POST OPERATIVE OFFICE NOTE    CC:  F/u for surgery  HPI:  This is a 64 y.o. male who is s/p left second stage basilic transposition on 02/10/2020 by Dr. Trula Slade.  Pt returns today for follow up.   He denise pain, loss of motor and sensation in the left UE.  He is currently on HD via right TDC placed by [facility].  Allergies  Allergen Reactions  . Ivp Dye [Iodinated Diagnostic Agents] Itching and Other (See Comments)    SKIN PEELS  . Other Itching  . Iodine-131 Itching and Rash    Current Outpatient Medications  Medication Sig Dispense Refill  . albuterol (VENTOLIN HFA) 108 (90 Base) MCG/ACT inhaler Inhale 2 puffs into the lungs every 6 (six) hours as needed for wheezing or shortness of breath (cough). 6.7 g 2  . allopurinol (ZYLOPRIM) 100 MG tablet Take 100 mg by mouth daily.    Marland Kitchen amLODipine (NORVASC) 10 MG tablet Take 10 mg by mouth daily.     Marland Kitchen aspirin EC 81 MG tablet Take 81 mg by mouth daily.    Marland Kitchen b complex-vitamin c-folic acid (NEPHRO-VITE) 0.8 MG TABS tablet Take 1 tablet by mouth daily.  3  . BRILINTA 90 MG TABS tablet Take 90 mg by mouth 2 (two) times daily.     . ferric citrate (AURYXIA) 1 GM 210 MG(Fe) tablet Take 210-420 mg by mouth 3 (three) times daily with meals.     . fluticasone (FLONASE) 50 MCG/ACT nasal spray Place 1 spray into both nostrils daily as needed for allergies.     Marland Kitchen gentamicin cream (GARAMYCIN) 0.1 % Apply 1 application topically daily.    Marland Kitchen HYDROcodone-acetaminophen (NORCO) 5-325 MG tablet Take 1 tablet by mouth every 6 (six) hours as needed for moderate pain. 20 tablet 0  . levothyroxine (SYNTHROID) 50 MCG tablet Take 50 mcg by mouth daily before breakfast.    . nitroGLYCERIN (NITROSTAT) 0.4 MG SL tablet Place 0.4 mg under the tongue every 2 (two) hours as needed.    Marland Kitchen omeprazole (PRILOSEC) 20 MG capsule Take 20 mg by mouth daily.     . rosuvastatin (CRESTOR) 5 MG tablet Take 5 mg by mouth every Monday, Wednesday, and Friday.     No current  facility-administered medications for this visit.     ROS:  See HPI  Physical Exam:    Incision:  Well healed with dry scaly skin surrounding the incision. Extremities:  Grip 5/5, sensation and palpable radial pulse Lungs non labored breathing   Assessment/Plan:  This is a 64 y.o. male who is s/p:Trasposition left basilic fistula HD via TDC placed by [facility]  The fistula may be accessed 03/12/2020 for HD.  Once working well [facility] with remove the TDC.  Roxy Horseman PA-C [facility] [phone]  Clinic MD:  Trula Slade

## 2020-03-03 ENCOUNTER — Encounter (HOSPITAL_COMMUNITY): Payer: Self-pay | Admitting: Vascular Surgery

## 2020-03-03 ENCOUNTER — Ambulatory Visit (INDEPENDENT_AMBULATORY_CARE_PROVIDER_SITE_OTHER): Payer: Medicare Other | Admitting: Vascular Surgery

## 2020-03-03 ENCOUNTER — Telehealth: Payer: Self-pay

## 2020-03-03 ENCOUNTER — Other Ambulatory Visit: Payer: Self-pay

## 2020-03-03 ENCOUNTER — Other Ambulatory Visit (HOSPITAL_COMMUNITY)
Admission: RE | Admit: 2020-03-03 | Discharge: 2020-03-03 | Disposition: A | Discharge status: Home / Self Care | Payer: Medicare Other | Source: Ambulatory Visit | Attending: Vascular Surgery | Admitting: Vascular Surgery

## 2020-03-03 VITALS — BP 107/70 | HR 81 | Temp 97.9°F | Resp 20 | Ht 72.0 in | Wt 198.0 lb

## 2020-03-03 DIAGNOSIS — Z20822 Contact with and (suspected) exposure to covid-19: Secondary | ICD-10-CM | POA: Diagnosis not present

## 2020-03-03 DIAGNOSIS — Z01812 Encounter for preprocedural laboratory examination: Secondary | ICD-10-CM | POA: Diagnosis present

## 2020-03-03 DIAGNOSIS — N186 End stage renal disease: Secondary | ICD-10-CM

## 2020-03-03 DIAGNOSIS — Z992 Dependence on renal dialysis: Secondary | ICD-10-CM

## 2020-03-03 LAB — SARS CORONAVIRUS 2 (TAT 6-24 HRS): SARS Coronavirus 2: NEGATIVE

## 2020-03-03 NOTE — H&P (View-Only) (Signed)
Patient is a 64 year old male who returns for postoperative follow-up today.  He is currently postoperative day 22 after second stage basilic transposition.  He was seen earlier this week and his incisions were healing well.  Over the last 24 hours the eschar near his anastomosis sloughed and he has now had some intermittent bleeding from this incision.  It did not appear to be bright red or pulsatile bleeding but was probably several tablespoons of blood.  He is currently dialyzing by right sided catheter.  Physical exam:  Vitals:   03/03/20 1318  BP: 107/70  Pulse: 81  Resp: 20  Temp: 97.9 F (36.6 C)  SpO2: 96%  Weight: 198 lb (89.8 kg)  Height: 6' (1.829 m)    Left upper extremity open area near the antecubital him 1.5 x 1.5 cm with fresh blood and dark eschar  Palpable thrill in fistula no significant hematoma or bleeding  Assessment: Dehiscence of antecubital incision with some intermittent bleeding  Plan: Patient will be scheduled for I&D of his left arm in the operating room tomorrow.  He was told to call 911 if he has further bleeding this evening.  Plan will be to I&D the wound and potentially try to reclose this.  Ruta Hinds, MD [facility] [place] Office: [phone]

## 2020-03-03 NOTE — Progress Notes (Signed)
Patient is a 64 year old male who returns for postoperative follow-up today.  He is currently postoperative day 22 after second stage basilic transposition.  He was seen earlier this week and his incisions were healing well.  Over the last 24 hours the eschar near his anastomosis sloughed and he has now had some intermittent bleeding from this incision.  It did not appear to be bright red or pulsatile bleeding but was probably several tablespoons of blood.  He is currently dialyzing by right sided catheter.  Physical exam:  Vitals:   03/03/20 1318  BP: 107/70  Pulse: 81  Resp: 20  Temp: 97.9 F (36.6 C)  SpO2: 96%  Weight: 198 lb (89.8 kg)  Height: 6' (1.829 m)    Left upper extremity open area near the antecubital him 1.5 x 1.5 cm with fresh blood and dark eschar  Palpable thrill in fistula no significant hematoma or bleeding  Assessment: Dehiscence of antecubital incision with some intermittent bleeding  Plan: Patient will be scheduled for I&D of his left arm in the operating room tomorrow.  He was told to call 911 if he has further bleeding this evening.  Plan will be to I&D the wound and potentially try to reclose this.  Ruta Hinds, MD [facility] [place] Office: [phone]

## 2020-03-03 NOTE — Progress Notes (Signed)
Pt denies SOB and chest pain. Pt stated that he is under the care of Dr. Ferdinand Lango, Cardiology and Dr. Ulanda Edison, PCP.  Pt stated that last dose of Brilinta was 03/02/20 at [facility].  Pt made aware to stop taking vitamins, fish oil and herbal medications. Do not take any NSAIDs ie: Ibuprofen, Advil, Naproxen (Aleve), Motrin, BC and Goody Powder. Pt reminded to quarantine. Pt verbalized undertstanding of all pre-op instructions.

## 2020-03-03 NOTE — Telephone Encounter (Signed)
Telephone call received from Reece Levy at [place]. Pt seen today for dialysis and concerns about incision dehiscence, bleeding and small hematoma noted at left BVT revision completed on 02/10/20 with Dr. Trula Slade. Denied any s/o infection.   Pt contacted and scheduled to see Dr. Oneida Alar in office on today at 1:20pm. Voiced understanding. Minette Brine, RN

## 2020-03-04 ENCOUNTER — Encounter (HOSPITAL_COMMUNITY): Payer: Self-pay | Admitting: Vascular Surgery

## 2020-03-04 ENCOUNTER — Ambulatory Visit (HOSPITAL_COMMUNITY): Payer: Medicare Other | Admitting: Anesthesiology

## 2020-03-04 ENCOUNTER — Ambulatory Visit (HOSPITAL_COMMUNITY)
Admission: RE | Admit: 2020-03-04 | Discharge: 2020-03-04 | Disposition: A | Discharge status: Home / Self Care | Payer: Medicare Other | Source: Ambulatory Visit | Attending: Vascular Surgery | Admitting: Vascular Surgery

## 2020-03-04 ENCOUNTER — Encounter (HOSPITAL_COMMUNITY)
Admission: RE | Disposition: A | Discharge status: Home / Self Care | Payer: Self-pay | Source: Ambulatory Visit | Attending: Vascular Surgery

## 2020-03-04 DIAGNOSIS — T8130XA Disruption of wound, unspecified, initial encounter: Secondary | ICD-10-CM | POA: Diagnosis present

## 2020-03-04 DIAGNOSIS — T8131XA Disruption of external operation (surgical) wound, not elsewhere classified, initial encounter: Secondary | ICD-10-CM | POA: Insufficient documentation

## 2020-03-04 DIAGNOSIS — E039 Hypothyroidism, unspecified: Secondary | ICD-10-CM | POA: Diagnosis not present

## 2020-03-04 DIAGNOSIS — G709 Myoneural disorder, unspecified: Secondary | ICD-10-CM | POA: Insufficient documentation

## 2020-03-04 DIAGNOSIS — I2510 Atherosclerotic heart disease of native coronary artery without angina pectoris: Secondary | ICD-10-CM | POA: Diagnosis not present

## 2020-03-04 DIAGNOSIS — I10 Essential (primary) hypertension: Secondary | ICD-10-CM | POA: Insufficient documentation

## 2020-03-04 DIAGNOSIS — Y832 Surgical operation with anastomosis, bypass or graft as the cause of abnormal reaction of the patient, or of later complication, without mention of misadventure at the time of the procedure: Secondary | ICD-10-CM | POA: Diagnosis not present

## 2020-03-04 DIAGNOSIS — N185 Chronic kidney disease, stage 5: Secondary | ICD-10-CM

## 2020-03-04 DIAGNOSIS — G4730 Sleep apnea, unspecified: Secondary | ICD-10-CM | POA: Diagnosis not present

## 2020-03-04 HISTORY — PX: DRAINAGE AND CLOSURE OF LYMPHOCELE: SHX5800

## 2020-03-04 HISTORY — DX: Disruption of external operation (surgical) wound, not elsewhere classified, initial encounter: T81.31XA

## 2020-03-04 LAB — POCT I-STAT, CHEM 8
BUN: 41 mg/dL — ABNORMAL HIGH (ref 8–23)
Calcium, Ion: 0.85 mmol/L — CL (ref 1.15–1.40)
Chloride: 98 mmol/L (ref 98–111)
Creatinine, Ser: 8.8 mg/dL — ABNORMAL HIGH (ref 0.61–1.24)
Glucose, Bld: 86 mg/dL (ref 70–99)
HCT: 37 % — ABNORMAL LOW (ref 39.0–52.0)
Hemoglobin: 12.6 g/dL — ABNORMAL LOW (ref 13.0–17.0)
Potassium: 4.6 mmol/L (ref 3.5–5.1)
Sodium: 140 mmol/L (ref 135–145)
TCO2: 29 mmol/L (ref 22–32)

## 2020-03-04 LAB — PROTIME-INR
INR: 1 (ref 0.8–1.2)
Prothrombin Time: 13.2 seconds (ref 11.4–15.2)

## 2020-03-04 SURGERY — DRAINAGE AND CLOSURE OF LYMPHOCELE
Anesthesia: General | Laterality: Left

## 2020-03-04 MED ORDER — CEFAZOLIN SODIUM-DEXTROSE 2-3 GM-%(50ML) IV SOLR
INTRAVENOUS | Status: DC | PRN
Start: 2020-03-04 — End: 2020-03-04
  Administered 2020-03-04: 2 g via INTRAVENOUS

## 2020-03-04 MED ORDER — SODIUM CHLORIDE 0.9 % IV SOLN: INTRAVENOUS | Status: DC | PRN

## 2020-03-04 MED ORDER — SODIUM CHLORIDE 0.9 % IV SOLN
INTRAVENOUS | Status: AC
  Filled 2020-03-04: qty 1.2

## 2020-03-04 MED ORDER — SODIUM CHLORIDE 0.9 % IV SOLN: INTRAVENOUS | Status: DC

## 2020-03-04 MED ORDER — CEFAZOLIN SODIUM-DEXTROSE 2-4 GM/100ML-% IV SOLN
INTRAVENOUS | Status: AC
  Filled 2020-03-04: qty 100

## 2020-03-04 MED ORDER — PROPOFOL 10 MG/ML IV BOLUS
INTRAVENOUS | Status: AC
  Filled 2020-03-04: qty 20

## 2020-03-04 MED ORDER — CHLORHEXIDINE GLUCONATE 0.12 % MT SOLN
15.0000 mL | Freq: Once | OROMUCOSAL | Status: AC
  Administered 2020-03-04: 15 mL via OROMUCOSAL
  Filled 2020-03-04: qty 15

## 2020-03-04 MED ORDER — ALBUMIN HUMAN 5 % IV SOLN
INTRAVENOUS | Status: DC | PRN
Start: 2020-03-04 — End: 2020-03-04

## 2020-03-04 MED ORDER — LIDOCAINE-EPINEPHRINE 1 %-1:100000 IJ SOLN
INTRAMUSCULAR | Status: AC
  Filled 2020-03-04: qty 1

## 2020-03-04 MED ORDER — LIDOCAINE HCL (PF) 1 % IJ SOLN
INTRAMUSCULAR | Status: AC
  Filled 2020-03-04: qty 30

## 2020-03-04 MED ORDER — PROPOFOL 10 MG/ML IV BOLUS
INTRAVENOUS | Status: DC | PRN
  Administered 2020-03-04: 50 mg via INTRAVENOUS
  Administered 2020-03-04: 150 mg via INTRAVENOUS

## 2020-03-04 MED ORDER — FENTANYL CITRATE (PF) 100 MCG/2ML IJ SOLN: 25.0000 ug | INTRAMUSCULAR | Status: DC | PRN

## 2020-03-04 MED ORDER — FENTANYL CITRATE (PF) 100 MCG/2ML IJ SOLN
INTRAMUSCULAR | Status: DC | PRN
  Administered 2020-03-04: 100 ug via INTRAVENOUS

## 2020-03-04 MED ORDER — FENTANYL CITRATE (PF) 250 MCG/5ML IJ SOLN
INTRAMUSCULAR | Status: AC
  Filled 2020-03-04: qty 5

## 2020-03-04 MED ORDER — ORAL CARE MOUTH RINSE: 15.0000 mL | Freq: Once | OROMUCOSAL | Status: AC

## 2020-03-04 MED ORDER — LIDOCAINE HCL (CARDIAC) PF 100 MG/5ML IV SOSY
PREFILLED_SYRINGE | INTRAVENOUS | Status: DC | PRN
  Administered 2020-03-04: 30 mg via INTRAVENOUS

## 2020-03-04 MED ORDER — PROPOFOL 500 MG/50ML IV EMUL: INTRAVENOUS | Status: DC | PRN

## 2020-03-04 MED ORDER — ONDANSETRON HCL 4 MG/2ML IJ SOLN
INTRAMUSCULAR | Status: DC | PRN
  Administered 2020-03-04: 4 mg via INTRAVENOUS

## 2020-03-04 MED ORDER — HYDROCODONE-ACETAMINOPHEN 5-325 MG PO TABS: 1.0000 | ORAL_TABLET | Freq: Four times a day (QID) | ORAL | 0 refills | Status: DC | PRN

## 2020-03-04 SURGICAL SUPPLY — 32 items
BNDG ELASTIC 4X5.8 VLCR STR LF (GAUZE/BANDAGES/DRESSINGS) IMPLANT
BNDG ELASTIC 6X5.8 VLCR STR LF (GAUZE/BANDAGES/DRESSINGS) IMPLANT
BNDG GAUZE ELAST 4 BULKY (GAUZE/BANDAGES/DRESSINGS) IMPLANT
CANISTER SUCT 3000ML PPV (MISCELLANEOUS) ×2 IMPLANT
COVER SURGICAL LIGHT HANDLE (MISCELLANEOUS) ×1 IMPLANT
COVER WAND RF STERILE (DRAPES) ×1 IMPLANT
DRSG COVADERM 4X6 (GAUZE/BANDAGES/DRESSINGS) ×1 IMPLANT
ELECT REM PT RETURN 9FT ADLT (ELECTROSURGICAL) ×2
ELECTRODE REM PT RTRN 9FT ADLT (ELECTROSURGICAL) ×1 IMPLANT
GAUZE SPONGE 4X4 12PLY STRL (GAUZE/BANDAGES/DRESSINGS) ×1 IMPLANT
GLOVE BIO SURGEON STRL SZ7.5 (GLOVE) ×2 IMPLANT
GLOVE BIOGEL PI IND STRL 6.5 (GLOVE) IMPLANT
GLOVE BIOGEL PI INDICATOR 6.5 (GLOVE) ×4
GLOVE SURG SS PI 6.5 STRL IVOR (GLOVE) ×1 IMPLANT
GOWN STRL REUS W/ TWL LRG LVL3 (GOWN DISPOSABLE) ×3 IMPLANT
GOWN STRL REUS W/TWL LRG LVL3 (GOWN DISPOSABLE) ×6
KIT BASIN OR (CUSTOM PROCEDURE TRAY) ×2 IMPLANT
KIT TURNOVER KIT B (KITS) ×2 IMPLANT
NS IRRIG 1000ML POUR BTL (IV SOLUTION) ×2 IMPLANT
PACK CV ACCESS (CUSTOM PROCEDURE TRAY) ×1 IMPLANT
PACK GENERAL/GYN (CUSTOM PROCEDURE TRAY) IMPLANT
PACK UNIVERSAL I (CUSTOM PROCEDURE TRAY) IMPLANT
PAD ARMBOARD 7.5X6 YLW CONV (MISCELLANEOUS) ×4 IMPLANT
STAPLER VISISTAT 35W (STAPLE) IMPLANT
SUT ETHILON 3 0 PS 1 (SUTURE) ×2 IMPLANT
SUT PROLENE 5 0 C1 (SUTURE) ×1 IMPLANT
SUT VIC AB 2-0 CTX 36 (SUTURE) IMPLANT
SUT VIC AB 3-0 SH 27 (SUTURE) ×2
SUT VIC AB 3-0 SH 27X BRD (SUTURE) IMPLANT
SUT VICRYL 4-0 PS2 18IN ABS (SUTURE) ×1 IMPLANT
TOWEL GREEN STERILE (TOWEL DISPOSABLE) ×2 IMPLANT
WATER STERILE IRR 1000ML POUR (IV SOLUTION) ×2 IMPLANT

## 2020-03-04 NOTE — Op Note (Signed)
Procedure: Incision and drainage left arm  Preoperative diagnosis: Poorly healing left arm wound  Postoperative diagnosis: Same  Anesthesia: General  Assistant: Nurse  Indications: Patient is a 64 year old male who is several weeks status post second stage basilic vein transposition fistula.  He presented yesterday with bleeding from an open wound near his anastomosis.  Operative details: After obtaining form consent, the patient taken the operating.  The patient placed supine position operating table.  After induction general esthesia endotracheal ovation patient's left upper extremities prepped and draped in usual sterile fashion.  There was a pre-existing wound near the antecubital area.  I opened the incision in this area and extended it for about 4 cm.  There were several areas of sort of macerated skin with some drainage.  I entered a small cavity with clear fluid suggestive of seroma.  There was no purulent collection.  Cultures were taken for anaerobic and aerobic.  The wound was then thoroughly irrigated with normal saline solution.  The skin edges were then reapproximated using interrupted simple and vertical mattress 3-0 nylon sutures.  The patient tired procedure well and there were no complications.  The incident sponge needle counts correct in the case.  Patient was taken to recovery in stable condition.  Ruta Hinds, MD [facility] [place] Office: [phone]

## 2020-03-04 NOTE — Transfer of Care (Signed)
Immediate Anesthesia Transfer of Care Note  Patient: Phillip Blair  Procedure(s) Performed: INCISION AND DRAINAGE OF ARM (Left )  Patient Location: PACU  Anesthesia Type:General  Level of Consciousness: awake, alert  and oriented  Airway & Oxygen Therapy: Patient Spontanous Breathing  Post-op Assessment: Report given to RN and Post -op Vital signs reviewed and stable  Post vital signs: Reviewed and stable  Last Vitals:  Vitals Value Taken Time  BP    Temp    Pulse    Resp    SpO2      Last Pain:  Vitals:   03/04/20 1259  TempSrc:   PainSc: 0-No pain         Complications: No complications documented.

## 2020-03-04 NOTE — Anesthesia Procedure Notes (Signed)
Procedure Name: Intubation Date/Time: 03/04/2020 5:01 PM Performed by: Eligha Bridegroom, CRNA Pre-anesthesia Checklist: Patient identified, Emergency Drugs available, Suction available, Patient being monitored and Timeout performed Patient Re-evaluated:Patient Re-evaluated prior to induction Oxygen Delivery Method: Circle system utilized Preoxygenation: Pre-oxygenation with 100% oxygen Induction Type: IV induction Ventilation: Mask ventilation without difficulty Laryngoscope Size: Mac and 4 Grade View: Grade I Tube type: Oral Tube size: 7.5 mm Number of attempts: 1 Airway Equipment and Method: Stylet Secured at: 22 cm Tube secured with: Tape Dental Injury: Teeth and Oropharynx as per pre-operative assessment

## 2020-03-04 NOTE — Anesthesia Postprocedure Evaluation (Signed)
Anesthesia Post Note  Patient: Phillip Blair  Procedure(s) Performed: INCISION AND DRAINAGE OF ARM (Left )     Patient location during evaluation: PACU Anesthesia Type: General Level of consciousness: awake Pain management: pain level controlled Vital Signs Assessment: post-procedure vital signs reviewed and stable Respiratory status: spontaneous breathing Cardiovascular status: stable Postop Assessment: no apparent nausea or vomiting Anesthetic complications: no   No complications documented.  Last Vitals:  Vitals:   03/04/20 1745 03/04/20 1800  BP: 100/67   Pulse: 85 85  Resp: 18 18  Temp: (!) 36.2 C   SpO2: 100% 100%    Last Pain:  Vitals:   03/04/20 1745  TempSrc:   PainSc: 0-No pain                 Winnifred Dufford

## 2020-03-04 NOTE — Interval H&P Note (Signed)
History and Physical Interval Note:  03/04/2020 5:33 PM  Phillip Blair  has presented today for surgery, with the diagnosis of END STAGE RENAL DISEASE.  The various methods of treatment have been discussed with the patient and family. After consideration of risks, benefits and other options for treatment, the patient has consented to  Procedure(s): INCISION AND DRAINAGE OF ARM (Left) as a surgical intervention.  The patient's history has been reviewed, patient examined, no change in status, stable for surgery.  I have reviewed the patient's chart and labs.  Questions were answered to the patient's satisfaction.     Ruta Hinds

## 2020-03-04 NOTE — Discharge Instructions (Signed)
   [facility]  Discharge Instructions  AV Fistula or Graft Surgery for Dialysis Access  Please refer to the following instructions for your post-procedure care. Your surgeon or physician assistant will discuss any changes with you.  Activity  You may drive the day following your surgery, if you are comfortable and no longer taking prescription pain medication. Resume full activity as the soreness in your incision resolves.  Bathing/Showering  You may shower after you go home. Keep your incision dry for 48 hours. Do not soak in a bathtub, hot tub, or swim until the incision heals completely. You may not shower if you have a hemodialysis catheter.  Incision Care  Clean your incision with mild soap and water after 48 hours. Pat the area dry with a clean towel. You do not need a bandage unless otherwise instructed. Do not apply any ointments or creams to your incision. You may have skin glue on your incision. Do not peel it off. It will come off on its own in about one week. Your arm may swell a bit after surgery. To reduce swelling use pillows to elevate your arm so it is above your heart. Your doctor will tell you if you need to lightly wrap your arm with an ACE bandage.  Diet  Resume your normal diet. There are not special food restrictions following this procedure. In order to heal from your surgery, it is CRITICAL to get adequate nutrition. Your body requires vitamins, minerals, and protein. Vegetables are the best source of vitamins and minerals. Vegetables also provide the perfect balance of protein. Processed food has little nutritional value, so try to avoid this.  Medications  Resume taking all of your medications. If your incision is causing pain, you may take over-the counter pain relievers such as acetaminophen (Tylenol). If you were prescribed a stronger pain medication, please be aware these medications can cause nausea and constipation. Prevent  nausea by taking the medication with a snack or meal. Avoid constipation by drinking plenty of fluids and eating foods with high amount of fiber, such as fruits, vegetables, and grains.  Do not take Tylenol if you are taking prescription pain medications.  Follow up Your surgeon may want to see you in the office following your access surgery. If so, this will be arranged at the time of your surgery.  Please call us immediately for any of the following conditions:  . Increased pain, redness, drainage (pus) from your incision site . Fever of 101 degrees or higher . Severe or worsening pain at your incision site . Hand pain or numbness. .  Reduce your risk of vascular disease:  . Stop smoking. If you would like help, call [facility] at 1-800-QUIT-NOW 803-419-4267) or [place] at 404-661-0093  . Manage your cholesterol . Maintain a desired weight . Control your diabetes . Keep your blood pressure down  Dialysis  It will take several weeks to several months for your new dialysis access to be ready for use. Your surgeon will determine when it is okay to use it. Your nephrologist will continue to direct your dialysis. You can continue to use your Permcath until your new access is ready for use.   03/04/2020 CAPONE SCHWINN [id number] May 07, 1956  Surgeon(s): Fields, Jessy Oto, MD  Procedure(s): INCISION AND DRAINAGE OF ARM   If you have any questions, please call the office at [phone].

## 2020-03-04 NOTE — Anesthesia Preprocedure Evaluation (Addendum)
Anesthesia Evaluation  Patient identified by MRN, date of birth, ID band Patient awake    Reviewed: Allergy & Precautions, NPO status , Patient's Chart, lab work & pertinent test results  Airway Mallampati: II  TM Distance: >3 FB     Dental   Pulmonary sleep apnea ,    breath sounds clear to auscultation       Cardiovascular hypertension, + CAD   Rhythm:Regular Rate:Normal     Neuro/Psych  Neuromuscular disease    GI/Hepatic Neg liver ROS, GERD  ,  Endo/Other  Hypothyroidism   Renal/GU Renal disease     Musculoskeletal   Abdominal   Peds  Hematology  (+) anemia ,   Anesthesia Other Findings   Reproductive/Obstetrics                             Anesthesia Physical Anesthesia Plan  ASA: III  Anesthesia Plan: General   Post-op Pain Management:    Induction: Intravenous  PONV Risk Score and Plan: 2 and Ondansetron and Propofol infusion  Airway Management Planned: LMA and Oral ETT  Additional Equipment:   Intra-op Plan:   Post-operative Plan: Extubation in OR  Informed Consent: I have reviewed the patients History and Physical, chart, labs and discussed the procedure including the risks, benefits and alternatives for the proposed anesthesia with the patient or authorized representative who has indicated his/her understanding and acceptance.     Dental advisory given  Plan Discussed with: CRNA and Anesthesiologist  Anesthesia Plan Comments:        Anesthesia Quick Evaluation

## 2020-03-05 ENCOUNTER — Encounter (HOSPITAL_COMMUNITY): Payer: Self-pay | Admitting: Vascular Surgery

## 2020-03-08 NOTE — Addendum Note (Signed)
Addendum  created 03/08/20 2043 by Belinda Block, MD   Intraprocedure Staff edited

## 2020-03-09 LAB — AEROBIC/ANAEROBIC CULTURE W GRAM STAIN (SURGICAL/DEEP WOUND): Gram Stain: NONE SEEN

## 2020-03-21 ENCOUNTER — Other Ambulatory Visit: Payer: Self-pay

## 2020-03-21 ENCOUNTER — Ambulatory Visit (INDEPENDENT_AMBULATORY_CARE_PROVIDER_SITE_OTHER): Payer: Medicare Other | Admitting: Physician Assistant

## 2020-03-21 VITALS — BP 113/70 | HR 73 | Temp 97.8°F | Resp 20 | Ht 72.0 in | Wt 198.8 lb

## 2020-03-21 DIAGNOSIS — Z992 Dependence on renal dialysis: Secondary | ICD-10-CM

## 2020-03-21 DIAGNOSIS — N186 End stage renal disease: Secondary | ICD-10-CM

## 2020-03-21 NOTE — Progress Notes (Signed)
    Postoperative Access Visit   History of Present Illness   Phillip Blair is a 64 y.o. year old male who presents for postoperative follow-up following incision and drainage of left arm for poorly healing left arm wound following second stage basilic vein transposition fistula on 03/04/20 by Dr. Oneida Alar. He was having bleeding and there was an opening near the anastomosis when he presented for post operative follow up on 03/03/20.   He is having very minimal surgical site soreness in the left arm. He is having more irritation from the sutures than anything. He had area where scab got pulled off and had very minimal bleeding. He says he did notice two days ago some red-yellow tinged small amount of drainage but he was not entirely sure where it came from along the incision line. He otherwise denies any steal symptoms. He does have a little numb feeling in the left forearm which he says has been present since his initial surgery. This is not painful or overly noticeable to him. He otherwise does not have any loss of function, numbness or pain in his left hand.  He continues to dialyze via right IJ TDC that was placed by [facility]  Physical Examination   Vitals:   03/21/20 1501  BP: 113/70  Pulse: 73  Resp: 20  Temp: 97.8 F (36.6 C)  TempSrc: Temporal  SpO2: 95%  Weight: 198 lb 12.8 oz (90.2 kg)  Height: 6' (1.829 m)   Body mass index is 26.96 kg/m.  left arm Incision is healing well. The interrupted and vertical mattress sutures were removed. Two steri strips placed over more proximal incision where there was a slight separation due to scab being torn off. 2+ radial pulse, hand grip is *5/5, sensation in digits is intact, palpable thrill, bruit can  be auscultated     Medical Decision Making   Phillip Blair is a 64 y.o. year old male who presents s/p I&D of left basilic vein fistula due to non healing anastomosis. He is doing well post op. The surgical incision is  healing well. Sutures were removed today. I did apply two steri strips along proximal incision over area that scab was avulsed due to some concerns that this area may dehisce. It is without any signs of infection.  He will continue to keep area clean and dry  Patent is without signs or symptoms of steal syndrome  The patient's access will be ready for use immediately. The incision is far enough away from fistula to use it for access  The patient's tunneled dialysis catheter can be removed when Nephrology is comfortable with the performance of the left AV fistula  Advised him to follow up if the incision does not continue to heal as expected or if he notices signs of infection such as swelling, redness, warmth or drainage  The patient may follow up on a prn basis   Phillip Caldwell, PA-C [facility] [place] Office: [facility] MD: Trula Slade

## 2020-08-31 ENCOUNTER — Other Ambulatory Visit: Payer: Self-pay

## 2020-08-31 ENCOUNTER — Encounter: Payer: Self-pay | Admitting: Dermatology

## 2020-08-31 ENCOUNTER — Ambulatory Visit (INDEPENDENT_AMBULATORY_CARE_PROVIDER_SITE_OTHER): Payer: Medicare Other | Admitting: Dermatology

## 2020-08-31 DIAGNOSIS — D2239 Melanocytic nevi of other parts of face: Secondary | ICD-10-CM | POA: Diagnosis not present

## 2020-08-31 DIAGNOSIS — D229 Melanocytic nevi, unspecified: Secondary | ICD-10-CM

## 2020-08-31 DIAGNOSIS — L738 Other specified follicular disorders: Secondary | ICD-10-CM | POA: Diagnosis not present

## 2020-08-31 MED ORDER — CLOBETASOL PROPIONATE 0.05 % EX FOAM: Freq: Every day | CUTANEOUS | 3 refills | Status: DC

## 2020-08-31 NOTE — Patient Instructions (Addendum)
First meeting between Mr. Antonio Creswell and myself.  2 issues discussed.  For several months he has been getting increasing number of sore knots on the back of his scalp and the back neck.  These 2 dozen firm dark hair-centered follicles represent folliculitis.  The type associated with kidney failure is called perforating folliculitis and can be quite unresponsive to treatment.  It is not an infection, it is not cancer, it is not serious, but it can be quite annoying.  Initially you will get 1 nonprescription medicine in the acne section of any pharmacy or [facility] or [place] called benzyl peroxide cleanser.  This can be 3 or 5 or 8 or 10%.  Apply this to your wet scalp and neck in the shower and let it sit for 1 to 2 minutes and then simply wash it off.  Rarely we will see someone who cannot use it because of a rash.  After each shower you will apply prescription medication called clobetasol which may be a foam or solution.  This has no side effect but should not be used on the face.  If there is significant improvement in 6weeks, you may choose to cancel your scheduled follow-up visit.  If there is no improvement, we may take 1 or 2 tiny biopsies at that time.  The second thing was discussed was small birth mole on the right inner cheek.  This has gotten a bit bigger and can easily be removed if he so chooses.  We will schedule adequate time to do this.  [name] knows he can call me if any problems arise.

## 2020-09-05 ENCOUNTER — Encounter: Payer: Self-pay | Admitting: Dermatology

## 2020-09-05 NOTE — Progress Notes (Signed)
   New Patient   Subjective  Phillip Blair is a 64 y.o. male who presents for the following: Skin Problem (pimples/ bumbs on the back of neck/hairline tx- shampoo, Right cheek- wants removed).  [place]ores Location: Back of scalp and neck Duration: Months Quality:  Associated Signs/Symptoms: Modifying Factors:  Severity:  Timing: Context: Renal failure   The following portions of the chart were reviewed this encounter and updated as appropriate:  Tobacco  Allergies  Meds  Problems  Med Hx  Surg Hx  Fam Hx      Objective  Well appearing patient in no apparent distress; mood and affect are within normal limits. Objective  Posterior Mid Neck, Right Occipital Scalp: 2 dozen firm pilar centric inflamed hyperpigmented papules on occiput and posterior neck. 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 The type related to renal failure, particularly seen and African-Americans, is cold perforating folliculitis and is notoriously treatment resistant.  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 Relevant physical exam findings are noted in the Assessment and Plan.   Assessment & Plan  Perforating folliculitis (2) Right Occipital Scalp; Posterior Mid Neck  Discussed obtaining biopsies this is a low yield in terms of suggesting Therapy so it was deferred. We will initially try over the counter medicated cleanser Benzyl Peroxide Wash; use this every 1 to 2 days in the shower applied and left on the affected area for 1 to 49minutes then wash off.  After showering he will apply topical clobetasol; this is not to be used on the face.  Please contact us in 1 month with a status update.  Ordered Medications: clobetasol (OLUX) 0.05 % topical foam  Nevus Right Buccal Cheek  Discussed elective removal; Phillip Blair will decide on this. First meeting  between Phillip Blair and myself.  2 issues discussed.  For several months he has been getting increasing number of sore knots on the back of his scalp and the back neck.  These 2 dozen firm dark hair-centered follicles represent folliculitis.  The type associated with kidney failure is called perforating folliculitis and can be quite unresponsive to treatment.  It is not an infection, it is not cancer, it is not serious, but it can be quite annoying.  Initially you will get 1 nonprescription medicine in the acne section of any pharmacy or [facility] or [place] called benzyl peroxide cleanser.  This can be 3 or 5 or 8 or 10%.  Apply this to your wet scalp and neck in the shower and let it sit for 1 to 2 minutes and then simply wash it off.  Rarely we will see someone who cannot use it because of a rash.  After each shower you will apply prescription medication called clobetasol which may be a foam or solution.  This has no side effect but should not be used on the face.  If there is significant improvement in 6weeks, you may choose to cancel your scheduled follow-up visit.  If there is no improvement, we may take 1 or 2 tiny biopsies at that time.  The second thing was discussed was small birth mole on the right inner cheek.  This has gotten a bit bigger and can easily be removed if he so chooses.  We will schedule adequate time to do this.  Phillip Blair knows he can call me if any problems arise.

## 2020-09-06 ENCOUNTER — Telehealth: Payer: Self-pay | Admitting: Dermatology

## 2020-09-06 DIAGNOSIS — L738 Other specified follicular disorders: Secondary | ICD-10-CM

## 2020-09-06 MED ORDER — CLOBETASOL PROPIONATE 0.05 % EX FOAM: Freq: Every day | CUTANEOUS | 3 refills | Status: DC

## 2020-09-06 NOTE — Telephone Encounter (Signed)
Clobetasol Foam needed Prior Authorization [facility]- sent prescription to [facility] for patient- patient will call pharmacy and get medicine. Told to call us if needed.

## 2020-09-06 NOTE — Telephone Encounter (Signed)
Patient calling to see if pharmacy ([facility] on [address]) sent PA request for Clobetasol foam? I did not see it in his chart review. He said that the pharmacy told him they sent it over on 12/13.

## 2020-09-13 ENCOUNTER — Other Ambulatory Visit: Payer: Self-pay

## 2020-09-13 ENCOUNTER — Ambulatory Visit: Payer: Medicare Other | Admitting: Podiatry

## 2020-09-13 DIAGNOSIS — L989 Disorder of the skin and subcutaneous tissue, unspecified: Secondary | ICD-10-CM

## 2020-09-13 DIAGNOSIS — L853 Xerosis cutis: Secondary | ICD-10-CM

## 2020-09-13 DIAGNOSIS — B351 Tinea unguium: Secondary | ICD-10-CM

## 2020-09-13 MED ORDER — CICLOPIROX 8 % EX SOLN: Freq: Every day | CUTANEOUS | 2 refills | Status: DC

## 2020-09-13 NOTE — Patient Instructions (Signed)
Keep the bandage on for 24 hours. At that time, remove and clean with soap and water. If it hurts or burns before 24 hours go ahead and remove the bandage and wash with soap and water. Keep the area clean. If there is any blistering cover with antibiotic ointment and a bandage. Monitor for any redness, drainage, or other signs of infection. Call the office if any are to occur. If you have any questions, please call the office at [phone].  

## 2020-09-15 NOTE — Progress Notes (Signed)
Subjective:   Patient ID: Phillip Blair, male   DOB: 64 y.o.   MRN: [id number]   HPI 64 year old male presents the office today for concerns of toenail discoloration to both of his big toes as well as a callus on the side of his right foot which is tender.  He also has dryness to his feet.  Patient with apparent ongoing issues for some time he said no recent treatment.  The calluses been ongoing for years.  He is on dialysis on Tuesday, Thursday, Saturday.  Denies any open lesions.  No recent injury or trauma.   Review of Systems  All other systems reviewed and are negative.  Past Medical History:  Diagnosis Date  . Allergy   . Anemia    low iron  . Chronic kidney disease    denies  . Coronary artery disease    02/06/19: Mild non-obstructive disease on left, 100% mid RCA with left-to-right collaterals. No PCI. S/p attempted RCA PCI 04/2019. S/p DES pRCA with concern for dissection, stented through mRCA with good result 07/17/19. DAPT x 6 months rec. [facility])  . Dehiscence of incision    antecubital  . ESRD (end stage renal disease) ([place])    s/p LUE basilic vein transpositiono 2017; s/p peritoneal dialysis catheter 12/31/17  . Family history of kidney stone   . GERD (gastroesophageal reflux disease)    occ  . Gout   . History of kidney stones    passed stone , no surgery  . Hypercalcemia   . Hyperlipidemia   . Hypertension    does not see a cardiologist  . Hypothyroidism   . Parathyroid adenoma   . Polycystic kidney disease   . Renal cancer ([place])    kidney  . Sleep apnea    no longer uses cpap, has lost weight  . Wears glasses     Past Surgical History:  Procedure Laterality Date  . AV FISTULA PLACEMENT Left 03/30/2016   Procedure: FIRST STAGE BASILIC VEIN TRANSPOSITION LEFT UPPER ARM;  Surgeon: Serafina Mitchell, MD;  Location: [place];  Service: Vascular;  Laterality: Left;  . BASCILIC VEIN TRANSPOSITION Left 06/13/2016   Procedure: SECOND STAGE BASILIC VEIN TRANSPOSITION;   Surgeon: Serafina Mitchell, MD;  Location: [place];  Service: Vascular;  Laterality: Left;  . BASCILIC VEIN TRANSPOSITION Left 02/10/2020   Procedure: Revision of LEFT STAGE BASCILIC VEIN TRANSPOSITION.;  Surgeon: Serafina Mitchell, MD;  Location: [place];  Service: Vascular;  Laterality: Left;  . CARDIAC CATHETERIZATION  05/11/2019   [facility]  . COLONOSCOPY    . DRAINAGE AND CLOSURE OF LYMPHOCELE Left 03/04/2020   Procedure: INCISION AND DRAINAGE OF ARM;  Surgeon: Elam Dutch, MD;  Location: [facility] OR;  Service: Vascular;  Laterality: Left;  . ESOPHAGOGASTRODUODENOSCOPY (EGD) WITH PROPOFOL N/A 01/03/2018   Procedure: ESOPHAGOGASTRODUODENOSCOPY (EGD) WITH PROPOFOL;  Surgeon: Jackquline Denmark, MD;  Location: [facility] ENDOSCOPY;  Service: Endoscopy;  Laterality: N/A;  . HERNIA REPAIR    . MINIMALLY INVASIVE RADIOACTIVE PARATHYROIDECTOMY N/A 05/04/2013   Procedure: PARATHYROIDECTOMY MINIMALLY INVASIVE;  Surgeon: Harl Bowie, MD;  Location: [place];  Service: General;  Laterality: N/A;  . MINIMALLY INVASIVE RADIOACTIVE PARATHYROIDECTOMY N/A 10/20/2013   Procedure: MINIMALLY INVASIVE PARATHYROIDECTOMY CONVERTED TO COMPLETE NECK EXPLORATION;  Surgeon: Harl Bowie, MD;  Location: [place];  Service: General;  Laterality: N/A;  . NASAL SEPTOPLASTY W/ TURBINOPLASTY    . PARATHYROIDECTOMY  04/16/2019  . PARATHYROIDECTOMY N/A 04/16/2019   Procedure: NECK  EXPLORATION AND PARATHYROIDECTOMY;  Surgeon: Armandina Gemma, MD;  Location: [place];  Service: General;  Laterality: N/A;  . ROBOT ASSISTED LAPAROSCOPIC NEPHRECTOMY Left 01/19/2015   Procedure: ROBOTIC ASSISTED LAPAROSCOPIC RADICAL NEPHRECTOMY AND INTRAOPERATIVE ULTRASOUND ;  Surgeon: Alexis Frock, MD;  Location: [facility];  Service: Urology;  Laterality: Left;  . THYROID LOBECTOMY Right 04/16/2019   Procedure: RIGHT THYROID LOBECTOMY;  Surgeon: Armandina Gemma, MD;  Location: [place];  Service: General;  Laterality: Right;  Marland Kitchen VASECTOMY       Current Outpatient  Medications:  .  acetaminophen (TYLENOL) 500 MG tablet, Take 500-1,000 mg by mouth every 6 (six) hours as needed for moderate pain or headache., Disp: , Rfl:  .  albuterol (VENTOLIN HFA) 108 (90 Base) MCG/ACT inhaler, Inhale 2 puffs into the lungs every 6 (six) hours as needed for wheezing or shortness of breath (cough)., Disp: 6.7 g, Rfl: 2 .  allopurinol (ZYLOPRIM) 100 MG tablet, Take 100 mg by mouth daily., Disp: , Rfl:  .  amLODipine (NORVASC) 10 MG tablet, Take 10 mg by mouth daily.  (Patient not taking: Reported on 08/31/2020), Disp: , Rfl:  .  aspirin EC 81 MG tablet, Take 81 mg by mouth daily., Disp: , Rfl:  .  b complex-vitamin c-folic acid (NEPHRO-VITE) 0.8 MG TABS tablet, Take 1 tablet by mouth daily., Disp: , Rfl: 3 .  ciclopirox (PENLAC) 8 % solution, Apply topically at bedtime. Apply over nail and surrounding skin. Apply daily over previous coat. After seven (7) days, may remove with alcohol and continue cycle., Disp: 6.6 mL, Rfl: 2 .  clobetasol (OLUX) 0.05 % topical foam, Apply topically daily. Apply daily after shower, Disp: 100 g, Rfl: 3 .  ferric citrate (AURYXIA) 1 GM 210 MG(Fe) tablet, Take 420 mg by mouth 3 (three) times daily with meals. , Disp: , Rfl:  .  fluticasone (FLONASE) 50 MCG/ACT nasal spray, Place 1 spray into both nostrils daily as needed for allergies. , Disp: , Rfl:  .  levothyroxine (SYNTHROID) 50 MCG tablet, Take 50 mcg by mouth daily before breakfast., Disp: , Rfl:  .  nitroGLYCERIN (NITROSTAT) 0.4 MG SL tablet, Place 0.4 mg under the tongue every 2 (two) hours as needed for chest pain. , Disp: , Rfl:  .  omeprazole (PRILOSEC) 20 MG capsule, Take 20 mg by mouth daily. , Disp: , Rfl:  .  Polyethylene Glycol 400 (VISINE DRY EYE RELIEF OP), Place 1 drop into both eyes daily as needed (dry eyes)., Disp: , Rfl:  .  rosuvastatin (CRESTOR) 5 MG tablet, Take 5 mg by mouth every Monday, Wednesday, and Friday., Disp: , Rfl:   Allergies  Allergen Reactions  . Ivp Dye  [Iodinated Diagnostic Agents] Itching and Other (See Comments)    SKIN PEELS  . Vancomycin Shortness Of Breath  . Iodine-131 Itching and Rash        Objective:  Physical Exam  General: AAO x3, NAD  Dermatological: Bilateral hallux nails are mildly hypertrophic, dystrophic with yellow-brown discoloration.  There is no pain in the nails there is no redness or drainage or any signs of infection.  Dry skin is present with any skin fissures.  Hyperkeratotic lesion on the plantar aspect of the right foot without any underlying ulceration drainage or any signs of infection.  Vascular: Dorsalis Pedis artery and Posterior Tibial artery pedal pulses are 2/4 bilateral with immedate capillary fill time. There is no pain with calf compression, swelling, warmth, erythema.   Neruologic: Grossly intact via  light touch bilateral.   Musculoskeletal: No gross boney pedal deformities bilateral. No pain, crepitus, or limitation noted with foot and ankle range of motion bilateral. Muscular strength 5/5 in all groups tested bilateral.  Gait: Unassisted, Nonantalgic.       Assessment:   64 year old male hallux onychomycosis, benign skin lesion.    Plan:  -Treatment options discussed including all alternatives, risks, and complications -Etiology of symptoms were discussed -Prescribed Penlac for nails. -Debrided hyperkeratotic lesion right foot with any complications or bleeding.  Recommended moisturizer.  Dispensed a sample of miracle foot cream.  Trula Slade DPM

## 2020-09-25 DIAGNOSIS — D689 Coagulation defect, unspecified: Secondary | ICD-10-CM | POA: Diagnosis not present

## 2020-09-25 DIAGNOSIS — Z992 Dependence on renal dialysis: Secondary | ICD-10-CM | POA: Diagnosis not present

## 2020-09-25 DIAGNOSIS — N2581 Secondary hyperparathyroidism of renal origin: Secondary | ICD-10-CM | POA: Diagnosis not present

## 2020-09-25 DIAGNOSIS — N186 End stage renal disease: Secondary | ICD-10-CM | POA: Diagnosis not present

## 2020-09-25 DIAGNOSIS — D631 Anemia in chronic kidney disease: Secondary | ICD-10-CM | POA: Diagnosis not present

## 2020-09-27 DIAGNOSIS — N186 End stage renal disease: Secondary | ICD-10-CM | POA: Diagnosis not present

## 2020-09-27 DIAGNOSIS — N2581 Secondary hyperparathyroidism of renal origin: Secondary | ICD-10-CM | POA: Diagnosis not present

## 2020-09-27 DIAGNOSIS — Z992 Dependence on renal dialysis: Secondary | ICD-10-CM | POA: Diagnosis not present

## 2020-09-27 DIAGNOSIS — D689 Coagulation defect, unspecified: Secondary | ICD-10-CM | POA: Diagnosis not present

## 2020-09-27 DIAGNOSIS — D631 Anemia in chronic kidney disease: Secondary | ICD-10-CM | POA: Diagnosis not present

## 2020-09-29 DIAGNOSIS — N2581 Secondary hyperparathyroidism of renal origin: Secondary | ICD-10-CM | POA: Diagnosis not present

## 2020-09-29 DIAGNOSIS — D631 Anemia in chronic kidney disease: Secondary | ICD-10-CM | POA: Diagnosis not present

## 2020-09-29 DIAGNOSIS — N186 End stage renal disease: Secondary | ICD-10-CM | POA: Diagnosis not present

## 2020-09-29 DIAGNOSIS — Z992 Dependence on renal dialysis: Secondary | ICD-10-CM | POA: Diagnosis not present

## 2020-09-29 DIAGNOSIS — D689 Coagulation defect, unspecified: Secondary | ICD-10-CM | POA: Diagnosis not present

## 2020-10-01 DIAGNOSIS — D689 Coagulation defect, unspecified: Secondary | ICD-10-CM | POA: Diagnosis not present

## 2020-10-01 DIAGNOSIS — Z992 Dependence on renal dialysis: Secondary | ICD-10-CM | POA: Diagnosis not present

## 2020-10-01 DIAGNOSIS — N2581 Secondary hyperparathyroidism of renal origin: Secondary | ICD-10-CM | POA: Diagnosis not present

## 2020-10-01 DIAGNOSIS — N186 End stage renal disease: Secondary | ICD-10-CM | POA: Diagnosis not present

## 2020-10-01 DIAGNOSIS — D631 Anemia in chronic kidney disease: Secondary | ICD-10-CM | POA: Diagnosis not present

## 2020-10-04 DIAGNOSIS — D631 Anemia in chronic kidney disease: Secondary | ICD-10-CM | POA: Diagnosis not present

## 2020-10-04 DIAGNOSIS — D689 Coagulation defect, unspecified: Secondary | ICD-10-CM | POA: Diagnosis not present

## 2020-10-04 DIAGNOSIS — Z992 Dependence on renal dialysis: Secondary | ICD-10-CM | POA: Diagnosis not present

## 2020-10-04 DIAGNOSIS — N186 End stage renal disease: Secondary | ICD-10-CM | POA: Diagnosis not present

## 2020-10-04 DIAGNOSIS — N2581 Secondary hyperparathyroidism of renal origin: Secondary | ICD-10-CM | POA: Diagnosis not present

## 2020-10-06 DIAGNOSIS — D631 Anemia in chronic kidney disease: Secondary | ICD-10-CM | POA: Diagnosis not present

## 2020-10-06 DIAGNOSIS — D689 Coagulation defect, unspecified: Secondary | ICD-10-CM | POA: Diagnosis not present

## 2020-10-06 DIAGNOSIS — Z992 Dependence on renal dialysis: Secondary | ICD-10-CM | POA: Diagnosis not present

## 2020-10-06 DIAGNOSIS — N2581 Secondary hyperparathyroidism of renal origin: Secondary | ICD-10-CM | POA: Diagnosis not present

## 2020-10-06 DIAGNOSIS — N186 End stage renal disease: Secondary | ICD-10-CM | POA: Diagnosis not present

## 2020-10-08 DIAGNOSIS — D689 Coagulation defect, unspecified: Secondary | ICD-10-CM | POA: Diagnosis not present

## 2020-10-08 DIAGNOSIS — D631 Anemia in chronic kidney disease: Secondary | ICD-10-CM | POA: Diagnosis not present

## 2020-10-08 DIAGNOSIS — N2581 Secondary hyperparathyroidism of renal origin: Secondary | ICD-10-CM | POA: Diagnosis not present

## 2020-10-08 DIAGNOSIS — N186 End stage renal disease: Secondary | ICD-10-CM | POA: Diagnosis not present

## 2020-10-08 DIAGNOSIS — Z992 Dependence on renal dialysis: Secondary | ICD-10-CM | POA: Diagnosis not present

## 2020-10-11 DIAGNOSIS — N186 End stage renal disease: Secondary | ICD-10-CM | POA: Diagnosis not present

## 2020-10-11 DIAGNOSIS — D631 Anemia in chronic kidney disease: Secondary | ICD-10-CM | POA: Diagnosis not present

## 2020-10-11 DIAGNOSIS — N2581 Secondary hyperparathyroidism of renal origin: Secondary | ICD-10-CM | POA: Diagnosis not present

## 2020-10-11 DIAGNOSIS — Z992 Dependence on renal dialysis: Secondary | ICD-10-CM | POA: Diagnosis not present

## 2020-10-11 DIAGNOSIS — D689 Coagulation defect, unspecified: Secondary | ICD-10-CM | POA: Diagnosis not present

## 2020-10-13 DIAGNOSIS — D631 Anemia in chronic kidney disease: Secondary | ICD-10-CM | POA: Diagnosis not present

## 2020-10-13 DIAGNOSIS — N186 End stage renal disease: Secondary | ICD-10-CM | POA: Diagnosis not present

## 2020-10-13 DIAGNOSIS — Z992 Dependence on renal dialysis: Secondary | ICD-10-CM | POA: Diagnosis not present

## 2020-10-13 DIAGNOSIS — N2581 Secondary hyperparathyroidism of renal origin: Secondary | ICD-10-CM | POA: Diagnosis not present

## 2020-10-13 DIAGNOSIS — D689 Coagulation defect, unspecified: Secondary | ICD-10-CM | POA: Diagnosis not present

## 2020-10-15 DIAGNOSIS — Z992 Dependence on renal dialysis: Secondary | ICD-10-CM | POA: Diagnosis not present

## 2020-10-15 DIAGNOSIS — N2581 Secondary hyperparathyroidism of renal origin: Secondary | ICD-10-CM | POA: Diagnosis not present

## 2020-10-15 DIAGNOSIS — N186 End stage renal disease: Secondary | ICD-10-CM | POA: Diagnosis not present

## 2020-10-15 DIAGNOSIS — D631 Anemia in chronic kidney disease: Secondary | ICD-10-CM | POA: Diagnosis not present

## 2020-10-15 DIAGNOSIS — D689 Coagulation defect, unspecified: Secondary | ICD-10-CM | POA: Diagnosis not present

## 2020-10-18 DIAGNOSIS — N186 End stage renal disease: Secondary | ICD-10-CM | POA: Diagnosis not present

## 2020-10-18 DIAGNOSIS — D631 Anemia in chronic kidney disease: Secondary | ICD-10-CM | POA: Diagnosis not present

## 2020-10-18 DIAGNOSIS — D689 Coagulation defect, unspecified: Secondary | ICD-10-CM | POA: Diagnosis not present

## 2020-10-18 DIAGNOSIS — Z992 Dependence on renal dialysis: Secondary | ICD-10-CM | POA: Diagnosis not present

## 2020-10-18 DIAGNOSIS — N2581 Secondary hyperparathyroidism of renal origin: Secondary | ICD-10-CM | POA: Diagnosis not present

## 2020-10-20 DIAGNOSIS — Z992 Dependence on renal dialysis: Secondary | ICD-10-CM | POA: Diagnosis not present

## 2020-10-20 DIAGNOSIS — D689 Coagulation defect, unspecified: Secondary | ICD-10-CM | POA: Diagnosis not present

## 2020-10-20 DIAGNOSIS — N186 End stage renal disease: Secondary | ICD-10-CM | POA: Diagnosis not present

## 2020-10-20 DIAGNOSIS — D631 Anemia in chronic kidney disease: Secondary | ICD-10-CM | POA: Diagnosis not present

## 2020-10-20 DIAGNOSIS — N2581 Secondary hyperparathyroidism of renal origin: Secondary | ICD-10-CM | POA: Diagnosis not present

## 2020-10-22 DIAGNOSIS — D689 Coagulation defect, unspecified: Secondary | ICD-10-CM | POA: Diagnosis not present

## 2020-10-22 DIAGNOSIS — D631 Anemia in chronic kidney disease: Secondary | ICD-10-CM | POA: Diagnosis not present

## 2020-10-22 DIAGNOSIS — N2581 Secondary hyperparathyroidism of renal origin: Secondary | ICD-10-CM | POA: Diagnosis not present

## 2020-10-22 DIAGNOSIS — N186 End stage renal disease: Secondary | ICD-10-CM | POA: Diagnosis not present

## 2020-10-22 DIAGNOSIS — Z992 Dependence on renal dialysis: Secondary | ICD-10-CM | POA: Diagnosis not present

## 2020-10-24 DIAGNOSIS — Z992 Dependence on renal dialysis: Secondary | ICD-10-CM | POA: Diagnosis not present

## 2020-10-24 DIAGNOSIS — N186 End stage renal disease: Secondary | ICD-10-CM | POA: Diagnosis not present

## 2020-10-25 DIAGNOSIS — D689 Coagulation defect, unspecified: Secondary | ICD-10-CM | POA: Diagnosis not present

## 2020-10-25 DIAGNOSIS — Z992 Dependence on renal dialysis: Secondary | ICD-10-CM | POA: Diagnosis not present

## 2020-10-25 DIAGNOSIS — N2581 Secondary hyperparathyroidism of renal origin: Secondary | ICD-10-CM | POA: Diagnosis not present

## 2020-10-25 DIAGNOSIS — E875 Hyperkalemia: Secondary | ICD-10-CM | POA: Diagnosis not present

## 2020-10-25 DIAGNOSIS — N186 End stage renal disease: Secondary | ICD-10-CM | POA: Diagnosis not present

## 2020-10-27 DIAGNOSIS — E875 Hyperkalemia: Secondary | ICD-10-CM | POA: Diagnosis not present

## 2020-10-27 DIAGNOSIS — N186 End stage renal disease: Secondary | ICD-10-CM | POA: Diagnosis not present

## 2020-10-27 DIAGNOSIS — N2581 Secondary hyperparathyroidism of renal origin: Secondary | ICD-10-CM | POA: Diagnosis not present

## 2020-10-27 DIAGNOSIS — Z992 Dependence on renal dialysis: Secondary | ICD-10-CM | POA: Diagnosis not present

## 2020-10-27 DIAGNOSIS — D689 Coagulation defect, unspecified: Secondary | ICD-10-CM | POA: Diagnosis not present

## 2020-10-29 DIAGNOSIS — E875 Hyperkalemia: Secondary | ICD-10-CM | POA: Diagnosis not present

## 2020-10-29 DIAGNOSIS — Z992 Dependence on renal dialysis: Secondary | ICD-10-CM | POA: Diagnosis not present

## 2020-10-29 DIAGNOSIS — N2581 Secondary hyperparathyroidism of renal origin: Secondary | ICD-10-CM | POA: Diagnosis not present

## 2020-10-29 DIAGNOSIS — N186 End stage renal disease: Secondary | ICD-10-CM | POA: Diagnosis not present

## 2020-10-29 DIAGNOSIS — D689 Coagulation defect, unspecified: Secondary | ICD-10-CM | POA: Diagnosis not present

## 2020-11-01 DIAGNOSIS — Z992 Dependence on renal dialysis: Secondary | ICD-10-CM | POA: Diagnosis not present

## 2020-11-01 DIAGNOSIS — D689 Coagulation defect, unspecified: Secondary | ICD-10-CM | POA: Diagnosis not present

## 2020-11-01 DIAGNOSIS — N186 End stage renal disease: Secondary | ICD-10-CM | POA: Diagnosis not present

## 2020-11-01 DIAGNOSIS — N2581 Secondary hyperparathyroidism of renal origin: Secondary | ICD-10-CM | POA: Diagnosis not present

## 2020-11-01 DIAGNOSIS — E875 Hyperkalemia: Secondary | ICD-10-CM | POA: Diagnosis not present

## 2020-11-03 DIAGNOSIS — D689 Coagulation defect, unspecified: Secondary | ICD-10-CM | POA: Diagnosis not present

## 2020-11-03 DIAGNOSIS — Z992 Dependence on renal dialysis: Secondary | ICD-10-CM | POA: Diagnosis not present

## 2020-11-03 DIAGNOSIS — N186 End stage renal disease: Secondary | ICD-10-CM | POA: Diagnosis not present

## 2020-11-03 DIAGNOSIS — E875 Hyperkalemia: Secondary | ICD-10-CM | POA: Diagnosis not present

## 2020-11-03 DIAGNOSIS — N2581 Secondary hyperparathyroidism of renal origin: Secondary | ICD-10-CM | POA: Diagnosis not present

## 2020-11-05 DIAGNOSIS — N186 End stage renal disease: Secondary | ICD-10-CM | POA: Diagnosis not present

## 2020-11-05 DIAGNOSIS — E875 Hyperkalemia: Secondary | ICD-10-CM | POA: Diagnosis not present

## 2020-11-05 DIAGNOSIS — Z992 Dependence on renal dialysis: Secondary | ICD-10-CM | POA: Diagnosis not present

## 2020-11-05 DIAGNOSIS — D689 Coagulation defect, unspecified: Secondary | ICD-10-CM | POA: Diagnosis not present

## 2020-11-05 DIAGNOSIS — N2581 Secondary hyperparathyroidism of renal origin: Secondary | ICD-10-CM | POA: Diagnosis not present

## 2020-11-08 DIAGNOSIS — N186 End stage renal disease: Secondary | ICD-10-CM | POA: Diagnosis not present

## 2020-11-08 DIAGNOSIS — N2581 Secondary hyperparathyroidism of renal origin: Secondary | ICD-10-CM | POA: Diagnosis not present

## 2020-11-08 DIAGNOSIS — E875 Hyperkalemia: Secondary | ICD-10-CM | POA: Diagnosis not present

## 2020-11-08 DIAGNOSIS — Z992 Dependence on renal dialysis: Secondary | ICD-10-CM | POA: Diagnosis not present

## 2020-11-08 DIAGNOSIS — D689 Coagulation defect, unspecified: Secondary | ICD-10-CM | POA: Diagnosis not present

## 2020-11-10 DIAGNOSIS — E875 Hyperkalemia: Secondary | ICD-10-CM | POA: Diagnosis not present

## 2020-11-10 DIAGNOSIS — N2581 Secondary hyperparathyroidism of renal origin: Secondary | ICD-10-CM | POA: Diagnosis not present

## 2020-11-10 DIAGNOSIS — N186 End stage renal disease: Secondary | ICD-10-CM | POA: Diagnosis not present

## 2020-11-10 DIAGNOSIS — Z992 Dependence on renal dialysis: Secondary | ICD-10-CM | POA: Diagnosis not present

## 2020-11-10 DIAGNOSIS — D689 Coagulation defect, unspecified: Secondary | ICD-10-CM | POA: Diagnosis not present

## 2020-11-12 DIAGNOSIS — Z992 Dependence on renal dialysis: Secondary | ICD-10-CM | POA: Diagnosis not present

## 2020-11-12 DIAGNOSIS — N2581 Secondary hyperparathyroidism of renal origin: Secondary | ICD-10-CM | POA: Diagnosis not present

## 2020-11-12 DIAGNOSIS — D689 Coagulation defect, unspecified: Secondary | ICD-10-CM | POA: Diagnosis not present

## 2020-11-12 DIAGNOSIS — N186 End stage renal disease: Secondary | ICD-10-CM | POA: Diagnosis not present

## 2020-11-12 DIAGNOSIS — E875 Hyperkalemia: Secondary | ICD-10-CM | POA: Diagnosis not present

## 2020-11-14 DIAGNOSIS — C642 Malignant neoplasm of left kidney, except renal pelvis: Secondary | ICD-10-CM | POA: Diagnosis not present

## 2020-11-15 DIAGNOSIS — E875 Hyperkalemia: Secondary | ICD-10-CM | POA: Diagnosis not present

## 2020-11-15 DIAGNOSIS — N186 End stage renal disease: Secondary | ICD-10-CM | POA: Diagnosis not present

## 2020-11-15 DIAGNOSIS — N2581 Secondary hyperparathyroidism of renal origin: Secondary | ICD-10-CM | POA: Diagnosis not present

## 2020-11-15 DIAGNOSIS — D689 Coagulation defect, unspecified: Secondary | ICD-10-CM | POA: Diagnosis not present

## 2020-11-15 DIAGNOSIS — Z992 Dependence on renal dialysis: Secondary | ICD-10-CM | POA: Diagnosis not present

## 2020-11-16 ENCOUNTER — Ambulatory Visit: Payer: Medicare Other | Admitting: Dermatology

## 2020-11-17 DIAGNOSIS — E875 Hyperkalemia: Secondary | ICD-10-CM | POA: Diagnosis not present

## 2020-11-17 DIAGNOSIS — N186 End stage renal disease: Secondary | ICD-10-CM | POA: Diagnosis not present

## 2020-11-17 DIAGNOSIS — D689 Coagulation defect, unspecified: Secondary | ICD-10-CM | POA: Diagnosis not present

## 2020-11-17 DIAGNOSIS — N2581 Secondary hyperparathyroidism of renal origin: Secondary | ICD-10-CM | POA: Diagnosis not present

## 2020-11-17 DIAGNOSIS — Z992 Dependence on renal dialysis: Secondary | ICD-10-CM | POA: Diagnosis not present

## 2020-11-19 DIAGNOSIS — E875 Hyperkalemia: Secondary | ICD-10-CM | POA: Diagnosis not present

## 2020-11-19 DIAGNOSIS — D689 Coagulation defect, unspecified: Secondary | ICD-10-CM | POA: Diagnosis not present

## 2020-11-19 DIAGNOSIS — N186 End stage renal disease: Secondary | ICD-10-CM | POA: Diagnosis not present

## 2020-11-19 DIAGNOSIS — N2581 Secondary hyperparathyroidism of renal origin: Secondary | ICD-10-CM | POA: Diagnosis not present

## 2020-11-19 DIAGNOSIS — Z992 Dependence on renal dialysis: Secondary | ICD-10-CM | POA: Diagnosis not present

## 2020-11-21 DIAGNOSIS — N186 End stage renal disease: Secondary | ICD-10-CM | POA: Diagnosis not present

## 2020-11-21 DIAGNOSIS — Z992 Dependence on renal dialysis: Secondary | ICD-10-CM | POA: Diagnosis not present

## 2020-11-22 DIAGNOSIS — E875 Hyperkalemia: Secondary | ICD-10-CM | POA: Diagnosis not present

## 2020-11-22 DIAGNOSIS — Z992 Dependence on renal dialysis: Secondary | ICD-10-CM | POA: Diagnosis not present

## 2020-11-22 DIAGNOSIS — D689 Coagulation defect, unspecified: Secondary | ICD-10-CM | POA: Diagnosis not present

## 2020-11-22 DIAGNOSIS — N2581 Secondary hyperparathyroidism of renal origin: Secondary | ICD-10-CM | POA: Diagnosis not present

## 2020-11-22 DIAGNOSIS — N186 End stage renal disease: Secondary | ICD-10-CM | POA: Diagnosis not present

## 2020-11-22 DIAGNOSIS — D631 Anemia in chronic kidney disease: Secondary | ICD-10-CM | POA: Diagnosis not present

## 2020-11-24 DIAGNOSIS — D689 Coagulation defect, unspecified: Secondary | ICD-10-CM | POA: Diagnosis not present

## 2020-11-24 DIAGNOSIS — N186 End stage renal disease: Secondary | ICD-10-CM | POA: Diagnosis not present

## 2020-11-24 DIAGNOSIS — D631 Anemia in chronic kidney disease: Secondary | ICD-10-CM | POA: Diagnosis not present

## 2020-11-24 DIAGNOSIS — Z992 Dependence on renal dialysis: Secondary | ICD-10-CM | POA: Diagnosis not present

## 2020-11-24 DIAGNOSIS — N2581 Secondary hyperparathyroidism of renal origin: Secondary | ICD-10-CM | POA: Diagnosis not present

## 2020-11-24 DIAGNOSIS — E875 Hyperkalemia: Secondary | ICD-10-CM | POA: Diagnosis not present

## 2020-11-26 DIAGNOSIS — Z992 Dependence on renal dialysis: Secondary | ICD-10-CM | POA: Diagnosis not present

## 2020-11-26 DIAGNOSIS — N2581 Secondary hyperparathyroidism of renal origin: Secondary | ICD-10-CM | POA: Diagnosis not present

## 2020-11-26 DIAGNOSIS — D689 Coagulation defect, unspecified: Secondary | ICD-10-CM | POA: Diagnosis not present

## 2020-11-26 DIAGNOSIS — E875 Hyperkalemia: Secondary | ICD-10-CM | POA: Diagnosis not present

## 2020-11-26 DIAGNOSIS — D631 Anemia in chronic kidney disease: Secondary | ICD-10-CM | POA: Diagnosis not present

## 2020-11-26 DIAGNOSIS — N186 End stage renal disease: Secondary | ICD-10-CM | POA: Diagnosis not present

## 2020-11-29 DIAGNOSIS — N186 End stage renal disease: Secondary | ICD-10-CM | POA: Diagnosis not present

## 2020-11-29 DIAGNOSIS — N2581 Secondary hyperparathyroidism of renal origin: Secondary | ICD-10-CM | POA: Diagnosis not present

## 2020-11-29 DIAGNOSIS — Z992 Dependence on renal dialysis: Secondary | ICD-10-CM | POA: Diagnosis not present

## 2020-11-29 DIAGNOSIS — D689 Coagulation defect, unspecified: Secondary | ICD-10-CM | POA: Diagnosis not present

## 2020-12-01 DIAGNOSIS — N186 End stage renal disease: Secondary | ICD-10-CM | POA: Diagnosis not present

## 2020-12-01 DIAGNOSIS — N2581 Secondary hyperparathyroidism of renal origin: Secondary | ICD-10-CM | POA: Diagnosis not present

## 2020-12-01 DIAGNOSIS — Z992 Dependence on renal dialysis: Secondary | ICD-10-CM | POA: Diagnosis not present

## 2020-12-01 DIAGNOSIS — D689 Coagulation defect, unspecified: Secondary | ICD-10-CM | POA: Diagnosis not present

## 2020-12-03 DIAGNOSIS — Z992 Dependence on renal dialysis: Secondary | ICD-10-CM | POA: Diagnosis not present

## 2020-12-03 DIAGNOSIS — N186 End stage renal disease: Secondary | ICD-10-CM | POA: Diagnosis not present

## 2020-12-03 DIAGNOSIS — D689 Coagulation defect, unspecified: Secondary | ICD-10-CM | POA: Diagnosis not present

## 2020-12-03 DIAGNOSIS — N2581 Secondary hyperparathyroidism of renal origin: Secondary | ICD-10-CM | POA: Diagnosis not present

## 2020-12-06 DIAGNOSIS — D631 Anemia in chronic kidney disease: Secondary | ICD-10-CM | POA: Diagnosis not present

## 2020-12-06 DIAGNOSIS — N2581 Secondary hyperparathyroidism of renal origin: Secondary | ICD-10-CM | POA: Diagnosis not present

## 2020-12-06 DIAGNOSIS — D689 Coagulation defect, unspecified: Secondary | ICD-10-CM | POA: Diagnosis not present

## 2020-12-06 DIAGNOSIS — N186 End stage renal disease: Secondary | ICD-10-CM | POA: Diagnosis not present

## 2020-12-06 DIAGNOSIS — Z992 Dependence on renal dialysis: Secondary | ICD-10-CM | POA: Diagnosis not present

## 2020-12-06 DIAGNOSIS — E875 Hyperkalemia: Secondary | ICD-10-CM | POA: Diagnosis not present

## 2020-12-06 DIAGNOSIS — D509 Iron deficiency anemia, unspecified: Secondary | ICD-10-CM | POA: Diagnosis not present

## 2020-12-08 DIAGNOSIS — N186 End stage renal disease: Secondary | ICD-10-CM | POA: Diagnosis not present

## 2020-12-08 DIAGNOSIS — E875 Hyperkalemia: Secondary | ICD-10-CM | POA: Diagnosis not present

## 2020-12-08 DIAGNOSIS — D689 Coagulation defect, unspecified: Secondary | ICD-10-CM | POA: Diagnosis not present

## 2020-12-08 DIAGNOSIS — N2581 Secondary hyperparathyroidism of renal origin: Secondary | ICD-10-CM | POA: Diagnosis not present

## 2020-12-08 DIAGNOSIS — D631 Anemia in chronic kidney disease: Secondary | ICD-10-CM | POA: Diagnosis not present

## 2020-12-08 DIAGNOSIS — Z992 Dependence on renal dialysis: Secondary | ICD-10-CM | POA: Diagnosis not present

## 2020-12-08 DIAGNOSIS — D509 Iron deficiency anemia, unspecified: Secondary | ICD-10-CM | POA: Diagnosis not present

## 2020-12-10 DIAGNOSIS — N186 End stage renal disease: Secondary | ICD-10-CM | POA: Diagnosis not present

## 2020-12-10 DIAGNOSIS — D689 Coagulation defect, unspecified: Secondary | ICD-10-CM | POA: Diagnosis not present

## 2020-12-10 DIAGNOSIS — D631 Anemia in chronic kidney disease: Secondary | ICD-10-CM | POA: Diagnosis not present

## 2020-12-10 DIAGNOSIS — D509 Iron deficiency anemia, unspecified: Secondary | ICD-10-CM | POA: Diagnosis not present

## 2020-12-10 DIAGNOSIS — Z992 Dependence on renal dialysis: Secondary | ICD-10-CM | POA: Diagnosis not present

## 2020-12-10 DIAGNOSIS — N2581 Secondary hyperparathyroidism of renal origin: Secondary | ICD-10-CM | POA: Diagnosis not present

## 2020-12-10 DIAGNOSIS — E875 Hyperkalemia: Secondary | ICD-10-CM | POA: Diagnosis not present

## 2020-12-13 DIAGNOSIS — E875 Hyperkalemia: Secondary | ICD-10-CM | POA: Diagnosis not present

## 2020-12-13 DIAGNOSIS — D689 Coagulation defect, unspecified: Secondary | ICD-10-CM | POA: Diagnosis not present

## 2020-12-13 DIAGNOSIS — N2581 Secondary hyperparathyroidism of renal origin: Secondary | ICD-10-CM | POA: Diagnosis not present

## 2020-12-13 DIAGNOSIS — Z992 Dependence on renal dialysis: Secondary | ICD-10-CM | POA: Diagnosis not present

## 2020-12-13 DIAGNOSIS — D509 Iron deficiency anemia, unspecified: Secondary | ICD-10-CM | POA: Diagnosis not present

## 2020-12-13 DIAGNOSIS — N186 End stage renal disease: Secondary | ICD-10-CM | POA: Diagnosis not present

## 2020-12-15 DIAGNOSIS — E875 Hyperkalemia: Secondary | ICD-10-CM | POA: Diagnosis not present

## 2020-12-15 DIAGNOSIS — N2581 Secondary hyperparathyroidism of renal origin: Secondary | ICD-10-CM | POA: Diagnosis not present

## 2020-12-15 DIAGNOSIS — D689 Coagulation defect, unspecified: Secondary | ICD-10-CM | POA: Diagnosis not present

## 2020-12-15 DIAGNOSIS — N186 End stage renal disease: Secondary | ICD-10-CM | POA: Diagnosis not present

## 2020-12-15 DIAGNOSIS — D509 Iron deficiency anemia, unspecified: Secondary | ICD-10-CM | POA: Diagnosis not present

## 2020-12-15 DIAGNOSIS — Z992 Dependence on renal dialysis: Secondary | ICD-10-CM | POA: Diagnosis not present

## 2020-12-17 DIAGNOSIS — N186 End stage renal disease: Secondary | ICD-10-CM | POA: Diagnosis not present

## 2020-12-17 DIAGNOSIS — N2581 Secondary hyperparathyroidism of renal origin: Secondary | ICD-10-CM | POA: Diagnosis not present

## 2020-12-17 DIAGNOSIS — D509 Iron deficiency anemia, unspecified: Secondary | ICD-10-CM | POA: Diagnosis not present

## 2020-12-17 DIAGNOSIS — D689 Coagulation defect, unspecified: Secondary | ICD-10-CM | POA: Diagnosis not present

## 2020-12-17 DIAGNOSIS — E875 Hyperkalemia: Secondary | ICD-10-CM | POA: Diagnosis not present

## 2020-12-17 DIAGNOSIS — Z992 Dependence on renal dialysis: Secondary | ICD-10-CM | POA: Diagnosis not present

## 2020-12-20 DIAGNOSIS — N186 End stage renal disease: Secondary | ICD-10-CM | POA: Diagnosis not present

## 2020-12-20 DIAGNOSIS — D509 Iron deficiency anemia, unspecified: Secondary | ICD-10-CM | POA: Diagnosis not present

## 2020-12-20 DIAGNOSIS — Z992 Dependence on renal dialysis: Secondary | ICD-10-CM | POA: Diagnosis not present

## 2020-12-20 DIAGNOSIS — D689 Coagulation defect, unspecified: Secondary | ICD-10-CM | POA: Diagnosis not present

## 2020-12-20 DIAGNOSIS — E875 Hyperkalemia: Secondary | ICD-10-CM | POA: Diagnosis not present

## 2020-12-20 DIAGNOSIS — N2581 Secondary hyperparathyroidism of renal origin: Secondary | ICD-10-CM | POA: Diagnosis not present

## 2020-12-22 DIAGNOSIS — D509 Iron deficiency anemia, unspecified: Secondary | ICD-10-CM | POA: Diagnosis not present

## 2020-12-22 DIAGNOSIS — D689 Coagulation defect, unspecified: Secondary | ICD-10-CM | POA: Diagnosis not present

## 2020-12-22 DIAGNOSIS — Z992 Dependence on renal dialysis: Secondary | ICD-10-CM | POA: Diagnosis not present

## 2020-12-22 DIAGNOSIS — E875 Hyperkalemia: Secondary | ICD-10-CM | POA: Diagnosis not present

## 2020-12-22 DIAGNOSIS — N186 End stage renal disease: Secondary | ICD-10-CM | POA: Diagnosis not present

## 2020-12-22 DIAGNOSIS — N2581 Secondary hyperparathyroidism of renal origin: Secondary | ICD-10-CM | POA: Diagnosis not present

## 2020-12-23 DIAGNOSIS — Z9009 Acquired absence of other part of head and neck: Secondary | ICD-10-CM | POA: Diagnosis not present

## 2020-12-23 DIAGNOSIS — E039 Hypothyroidism, unspecified: Secondary | ICD-10-CM | POA: Diagnosis not present

## 2020-12-24 DIAGNOSIS — D689 Coagulation defect, unspecified: Secondary | ICD-10-CM | POA: Diagnosis not present

## 2020-12-24 DIAGNOSIS — Z992 Dependence on renal dialysis: Secondary | ICD-10-CM | POA: Diagnosis not present

## 2020-12-24 DIAGNOSIS — N186 End stage renal disease: Secondary | ICD-10-CM | POA: Diagnosis not present

## 2020-12-24 DIAGNOSIS — D509 Iron deficiency anemia, unspecified: Secondary | ICD-10-CM | POA: Diagnosis not present

## 2020-12-24 DIAGNOSIS — N2581 Secondary hyperparathyroidism of renal origin: Secondary | ICD-10-CM | POA: Diagnosis not present

## 2020-12-26 DIAGNOSIS — I871 Compression of vein: Secondary | ICD-10-CM | POA: Diagnosis not present

## 2020-12-26 DIAGNOSIS — Z992 Dependence on renal dialysis: Secondary | ICD-10-CM | POA: Diagnosis not present

## 2020-12-26 DIAGNOSIS — T82858A Stenosis of vascular prosthetic devices, implants and grafts, initial encounter: Secondary | ICD-10-CM | POA: Diagnosis not present

## 2020-12-26 DIAGNOSIS — N186 End stage renal disease: Secondary | ICD-10-CM | POA: Diagnosis not present

## 2020-12-27 DIAGNOSIS — D689 Coagulation defect, unspecified: Secondary | ICD-10-CM | POA: Diagnosis not present

## 2020-12-27 DIAGNOSIS — N2581 Secondary hyperparathyroidism of renal origin: Secondary | ICD-10-CM | POA: Diagnosis not present

## 2020-12-27 DIAGNOSIS — N186 End stage renal disease: Secondary | ICD-10-CM | POA: Diagnosis not present

## 2020-12-27 DIAGNOSIS — D509 Iron deficiency anemia, unspecified: Secondary | ICD-10-CM | POA: Diagnosis not present

## 2020-12-27 DIAGNOSIS — Z992 Dependence on renal dialysis: Secondary | ICD-10-CM | POA: Diagnosis not present

## 2020-12-27 DIAGNOSIS — E875 Hyperkalemia: Secondary | ICD-10-CM | POA: Diagnosis not present

## 2020-12-29 DIAGNOSIS — D509 Iron deficiency anemia, unspecified: Secondary | ICD-10-CM | POA: Diagnosis not present

## 2020-12-29 DIAGNOSIS — E875 Hyperkalemia: Secondary | ICD-10-CM | POA: Diagnosis not present

## 2020-12-29 DIAGNOSIS — N2581 Secondary hyperparathyroidism of renal origin: Secondary | ICD-10-CM | POA: Diagnosis not present

## 2020-12-29 DIAGNOSIS — D689 Coagulation defect, unspecified: Secondary | ICD-10-CM | POA: Diagnosis not present

## 2020-12-29 DIAGNOSIS — N186 End stage renal disease: Secondary | ICD-10-CM | POA: Diagnosis not present

## 2020-12-29 DIAGNOSIS — Z992 Dependence on renal dialysis: Secondary | ICD-10-CM | POA: Diagnosis not present

## 2020-12-31 DIAGNOSIS — N186 End stage renal disease: Secondary | ICD-10-CM | POA: Diagnosis not present

## 2020-12-31 DIAGNOSIS — D509 Iron deficiency anemia, unspecified: Secondary | ICD-10-CM | POA: Diagnosis not present

## 2020-12-31 DIAGNOSIS — N2581 Secondary hyperparathyroidism of renal origin: Secondary | ICD-10-CM | POA: Diagnosis not present

## 2020-12-31 DIAGNOSIS — D689 Coagulation defect, unspecified: Secondary | ICD-10-CM | POA: Diagnosis not present

## 2020-12-31 DIAGNOSIS — Z992 Dependence on renal dialysis: Secondary | ICD-10-CM | POA: Diagnosis not present

## 2020-12-31 DIAGNOSIS — E875 Hyperkalemia: Secondary | ICD-10-CM | POA: Diagnosis not present

## 2021-01-03 DIAGNOSIS — D631 Anemia in chronic kidney disease: Secondary | ICD-10-CM | POA: Diagnosis not present

## 2021-01-03 DIAGNOSIS — D689 Coagulation defect, unspecified: Secondary | ICD-10-CM | POA: Diagnosis not present

## 2021-01-03 DIAGNOSIS — N2581 Secondary hyperparathyroidism of renal origin: Secondary | ICD-10-CM | POA: Diagnosis not present

## 2021-01-03 DIAGNOSIS — N186 End stage renal disease: Secondary | ICD-10-CM | POA: Diagnosis not present

## 2021-01-03 DIAGNOSIS — Z992 Dependence on renal dialysis: Secondary | ICD-10-CM | POA: Diagnosis not present

## 2021-01-03 DIAGNOSIS — D509 Iron deficiency anemia, unspecified: Secondary | ICD-10-CM | POA: Diagnosis not present

## 2021-01-04 DIAGNOSIS — E892 Postprocedural hypoparathyroidism: Secondary | ICD-10-CM | POA: Diagnosis not present

## 2021-01-04 DIAGNOSIS — Z9009 Acquired absence of other part of head and neck: Secondary | ICD-10-CM | POA: Diagnosis not present

## 2021-01-04 DIAGNOSIS — N2581 Secondary hyperparathyroidism of renal origin: Secondary | ICD-10-CM | POA: Diagnosis not present

## 2021-01-05 DIAGNOSIS — D689 Coagulation defect, unspecified: Secondary | ICD-10-CM | POA: Diagnosis not present

## 2021-01-05 DIAGNOSIS — Z992 Dependence on renal dialysis: Secondary | ICD-10-CM | POA: Diagnosis not present

## 2021-01-05 DIAGNOSIS — N2581 Secondary hyperparathyroidism of renal origin: Secondary | ICD-10-CM | POA: Diagnosis not present

## 2021-01-05 DIAGNOSIS — D509 Iron deficiency anemia, unspecified: Secondary | ICD-10-CM | POA: Diagnosis not present

## 2021-01-05 DIAGNOSIS — D631 Anemia in chronic kidney disease: Secondary | ICD-10-CM | POA: Diagnosis not present

## 2021-01-05 DIAGNOSIS — N186 End stage renal disease: Secondary | ICD-10-CM | POA: Diagnosis not present

## 2021-01-06 DIAGNOSIS — H2513 Age-related nuclear cataract, bilateral: Secondary | ICD-10-CM | POA: Diagnosis not present

## 2021-01-06 DIAGNOSIS — H2511 Age-related nuclear cataract, right eye: Secondary | ICD-10-CM | POA: Diagnosis not present

## 2021-01-06 DIAGNOSIS — H524 Presbyopia: Secondary | ICD-10-CM | POA: Diagnosis not present

## 2021-01-06 DIAGNOSIS — H25013 Cortical age-related cataract, bilateral: Secondary | ICD-10-CM | POA: Diagnosis not present

## 2021-01-06 DIAGNOSIS — Q141 Congenital malformation of retina: Secondary | ICD-10-CM | POA: Diagnosis not present

## 2021-01-06 DIAGNOSIS — H35033 Hypertensive retinopathy, bilateral: Secondary | ICD-10-CM | POA: Diagnosis not present

## 2021-01-07 DIAGNOSIS — N2581 Secondary hyperparathyroidism of renal origin: Secondary | ICD-10-CM | POA: Diagnosis not present

## 2021-01-07 DIAGNOSIS — D509 Iron deficiency anemia, unspecified: Secondary | ICD-10-CM | POA: Diagnosis not present

## 2021-01-07 DIAGNOSIS — D689 Coagulation defect, unspecified: Secondary | ICD-10-CM | POA: Diagnosis not present

## 2021-01-07 DIAGNOSIS — Z992 Dependence on renal dialysis: Secondary | ICD-10-CM | POA: Diagnosis not present

## 2021-01-07 DIAGNOSIS — N186 End stage renal disease: Secondary | ICD-10-CM | POA: Diagnosis not present

## 2021-01-07 DIAGNOSIS — D631 Anemia in chronic kidney disease: Secondary | ICD-10-CM | POA: Diagnosis not present

## 2021-01-10 DIAGNOSIS — Z992 Dependence on renal dialysis: Secondary | ICD-10-CM | POA: Diagnosis not present

## 2021-01-10 DIAGNOSIS — N2581 Secondary hyperparathyroidism of renal origin: Secondary | ICD-10-CM | POA: Diagnosis not present

## 2021-01-10 DIAGNOSIS — D689 Coagulation defect, unspecified: Secondary | ICD-10-CM | POA: Diagnosis not present

## 2021-01-10 DIAGNOSIS — N186 End stage renal disease: Secondary | ICD-10-CM | POA: Diagnosis not present

## 2021-01-10 DIAGNOSIS — E875 Hyperkalemia: Secondary | ICD-10-CM | POA: Diagnosis not present

## 2021-01-12 DIAGNOSIS — E875 Hyperkalemia: Secondary | ICD-10-CM | POA: Diagnosis not present

## 2021-01-12 DIAGNOSIS — D689 Coagulation defect, unspecified: Secondary | ICD-10-CM | POA: Diagnosis not present

## 2021-01-12 DIAGNOSIS — N2581 Secondary hyperparathyroidism of renal origin: Secondary | ICD-10-CM | POA: Diagnosis not present

## 2021-01-12 DIAGNOSIS — Z992 Dependence on renal dialysis: Secondary | ICD-10-CM | POA: Diagnosis not present

## 2021-01-12 DIAGNOSIS — N186 End stage renal disease: Secondary | ICD-10-CM | POA: Diagnosis not present

## 2021-01-14 DIAGNOSIS — D689 Coagulation defect, unspecified: Secondary | ICD-10-CM | POA: Diagnosis not present

## 2021-01-14 DIAGNOSIS — N2581 Secondary hyperparathyroidism of renal origin: Secondary | ICD-10-CM | POA: Diagnosis not present

## 2021-01-14 DIAGNOSIS — E875 Hyperkalemia: Secondary | ICD-10-CM | POA: Diagnosis not present

## 2021-01-14 DIAGNOSIS — Z992 Dependence on renal dialysis: Secondary | ICD-10-CM | POA: Diagnosis not present

## 2021-01-14 DIAGNOSIS — N186 End stage renal disease: Secondary | ICD-10-CM | POA: Diagnosis not present

## 2021-01-16 DIAGNOSIS — Z23 Encounter for immunization: Secondary | ICD-10-CM | POA: Diagnosis not present

## 2021-01-17 DIAGNOSIS — E875 Hyperkalemia: Secondary | ICD-10-CM | POA: Diagnosis not present

## 2021-01-17 DIAGNOSIS — N186 End stage renal disease: Secondary | ICD-10-CM | POA: Diagnosis not present

## 2021-01-17 DIAGNOSIS — D689 Coagulation defect, unspecified: Secondary | ICD-10-CM | POA: Diagnosis not present

## 2021-01-17 DIAGNOSIS — Z992 Dependence on renal dialysis: Secondary | ICD-10-CM | POA: Diagnosis not present

## 2021-01-17 DIAGNOSIS — N2581 Secondary hyperparathyroidism of renal origin: Secondary | ICD-10-CM | POA: Diagnosis not present

## 2021-01-19 DIAGNOSIS — N2581 Secondary hyperparathyroidism of renal origin: Secondary | ICD-10-CM | POA: Diagnosis not present

## 2021-01-19 DIAGNOSIS — Z992 Dependence on renal dialysis: Secondary | ICD-10-CM | POA: Diagnosis not present

## 2021-01-19 DIAGNOSIS — E875 Hyperkalemia: Secondary | ICD-10-CM | POA: Diagnosis not present

## 2021-01-19 DIAGNOSIS — D689 Coagulation defect, unspecified: Secondary | ICD-10-CM | POA: Diagnosis not present

## 2021-01-19 DIAGNOSIS — N186 End stage renal disease: Secondary | ICD-10-CM | POA: Diagnosis not present

## 2021-01-21 DIAGNOSIS — D689 Coagulation defect, unspecified: Secondary | ICD-10-CM | POA: Diagnosis not present

## 2021-01-21 DIAGNOSIS — N186 End stage renal disease: Secondary | ICD-10-CM | POA: Diagnosis not present

## 2021-01-21 DIAGNOSIS — N2581 Secondary hyperparathyroidism of renal origin: Secondary | ICD-10-CM | POA: Diagnosis not present

## 2021-01-21 DIAGNOSIS — E875 Hyperkalemia: Secondary | ICD-10-CM | POA: Diagnosis not present

## 2021-01-21 DIAGNOSIS — Z992 Dependence on renal dialysis: Secondary | ICD-10-CM | POA: Diagnosis not present

## 2021-01-24 DIAGNOSIS — E875 Hyperkalemia: Secondary | ICD-10-CM | POA: Diagnosis not present

## 2021-01-24 DIAGNOSIS — N186 End stage renal disease: Secondary | ICD-10-CM | POA: Diagnosis not present

## 2021-01-24 DIAGNOSIS — D689 Coagulation defect, unspecified: Secondary | ICD-10-CM | POA: Diagnosis not present

## 2021-01-24 DIAGNOSIS — Z992 Dependence on renal dialysis: Secondary | ICD-10-CM | POA: Diagnosis not present

## 2021-01-24 DIAGNOSIS — N2581 Secondary hyperparathyroidism of renal origin: Secondary | ICD-10-CM | POA: Diagnosis not present

## 2021-01-26 DIAGNOSIS — D689 Coagulation defect, unspecified: Secondary | ICD-10-CM | POA: Diagnosis not present

## 2021-01-26 DIAGNOSIS — E875 Hyperkalemia: Secondary | ICD-10-CM | POA: Diagnosis not present

## 2021-01-26 DIAGNOSIS — N186 End stage renal disease: Secondary | ICD-10-CM | POA: Diagnosis not present

## 2021-01-26 DIAGNOSIS — N2581 Secondary hyperparathyroidism of renal origin: Secondary | ICD-10-CM | POA: Diagnosis not present

## 2021-01-26 DIAGNOSIS — Z992 Dependence on renal dialysis: Secondary | ICD-10-CM | POA: Diagnosis not present

## 2021-01-27 DIAGNOSIS — E875 Hyperkalemia: Secondary | ICD-10-CM | POA: Diagnosis not present

## 2021-01-27 DIAGNOSIS — N2581 Secondary hyperparathyroidism of renal origin: Secondary | ICD-10-CM | POA: Diagnosis not present

## 2021-01-27 DIAGNOSIS — Z992 Dependence on renal dialysis: Secondary | ICD-10-CM | POA: Diagnosis not present

## 2021-01-27 DIAGNOSIS — D689 Coagulation defect, unspecified: Secondary | ICD-10-CM | POA: Diagnosis not present

## 2021-01-27 DIAGNOSIS — N186 End stage renal disease: Secondary | ICD-10-CM | POA: Diagnosis not present

## 2021-01-31 DIAGNOSIS — N186 End stage renal disease: Secondary | ICD-10-CM | POA: Diagnosis not present

## 2021-01-31 DIAGNOSIS — N2581 Secondary hyperparathyroidism of renal origin: Secondary | ICD-10-CM | POA: Diagnosis not present

## 2021-01-31 DIAGNOSIS — D631 Anemia in chronic kidney disease: Secondary | ICD-10-CM | POA: Diagnosis not present

## 2021-01-31 DIAGNOSIS — Z992 Dependence on renal dialysis: Secondary | ICD-10-CM | POA: Diagnosis not present

## 2021-01-31 DIAGNOSIS — D689 Coagulation defect, unspecified: Secondary | ICD-10-CM | POA: Diagnosis not present

## 2021-01-31 DIAGNOSIS — D509 Iron deficiency anemia, unspecified: Secondary | ICD-10-CM | POA: Diagnosis not present

## 2021-02-02 DIAGNOSIS — Z992 Dependence on renal dialysis: Secondary | ICD-10-CM | POA: Diagnosis not present

## 2021-02-02 DIAGNOSIS — D509 Iron deficiency anemia, unspecified: Secondary | ICD-10-CM | POA: Diagnosis not present

## 2021-02-02 DIAGNOSIS — N2581 Secondary hyperparathyroidism of renal origin: Secondary | ICD-10-CM | POA: Diagnosis not present

## 2021-02-02 DIAGNOSIS — D689 Coagulation defect, unspecified: Secondary | ICD-10-CM | POA: Diagnosis not present

## 2021-02-02 DIAGNOSIS — N186 End stage renal disease: Secondary | ICD-10-CM | POA: Diagnosis not present

## 2021-02-02 DIAGNOSIS — D631 Anemia in chronic kidney disease: Secondary | ICD-10-CM | POA: Diagnosis not present

## 2021-02-04 DIAGNOSIS — D631 Anemia in chronic kidney disease: Secondary | ICD-10-CM | POA: Diagnosis not present

## 2021-02-04 DIAGNOSIS — D509 Iron deficiency anemia, unspecified: Secondary | ICD-10-CM | POA: Diagnosis not present

## 2021-02-04 DIAGNOSIS — Z992 Dependence on renal dialysis: Secondary | ICD-10-CM | POA: Diagnosis not present

## 2021-02-04 DIAGNOSIS — D689 Coagulation defect, unspecified: Secondary | ICD-10-CM | POA: Diagnosis not present

## 2021-02-04 DIAGNOSIS — N2581 Secondary hyperparathyroidism of renal origin: Secondary | ICD-10-CM | POA: Diagnosis not present

## 2021-02-04 DIAGNOSIS — N186 End stage renal disease: Secondary | ICD-10-CM | POA: Diagnosis not present

## 2021-02-07 DIAGNOSIS — E875 Hyperkalemia: Secondary | ICD-10-CM | POA: Diagnosis not present

## 2021-02-07 DIAGNOSIS — N2581 Secondary hyperparathyroidism of renal origin: Secondary | ICD-10-CM | POA: Diagnosis not present

## 2021-02-07 DIAGNOSIS — N186 End stage renal disease: Secondary | ICD-10-CM | POA: Diagnosis not present

## 2021-02-07 DIAGNOSIS — D689 Coagulation defect, unspecified: Secondary | ICD-10-CM | POA: Diagnosis not present

## 2021-02-07 DIAGNOSIS — Z992 Dependence on renal dialysis: Secondary | ICD-10-CM | POA: Diagnosis not present

## 2021-02-09 DIAGNOSIS — E875 Hyperkalemia: Secondary | ICD-10-CM | POA: Diagnosis not present

## 2021-02-09 DIAGNOSIS — D689 Coagulation defect, unspecified: Secondary | ICD-10-CM | POA: Diagnosis not present

## 2021-02-09 DIAGNOSIS — N186 End stage renal disease: Secondary | ICD-10-CM | POA: Diagnosis not present

## 2021-02-09 DIAGNOSIS — N2581 Secondary hyperparathyroidism of renal origin: Secondary | ICD-10-CM | POA: Diagnosis not present

## 2021-02-09 DIAGNOSIS — Z992 Dependence on renal dialysis: Secondary | ICD-10-CM | POA: Diagnosis not present

## 2021-02-11 DIAGNOSIS — D689 Coagulation defect, unspecified: Secondary | ICD-10-CM | POA: Diagnosis not present

## 2021-02-11 DIAGNOSIS — Z992 Dependence on renal dialysis: Secondary | ICD-10-CM | POA: Diagnosis not present

## 2021-02-11 DIAGNOSIS — N2581 Secondary hyperparathyroidism of renal origin: Secondary | ICD-10-CM | POA: Diagnosis not present

## 2021-02-11 DIAGNOSIS — E875 Hyperkalemia: Secondary | ICD-10-CM | POA: Diagnosis not present

## 2021-02-11 DIAGNOSIS — N186 End stage renal disease: Secondary | ICD-10-CM | POA: Diagnosis not present

## 2021-02-14 ENCOUNTER — Ambulatory Visit: Payer: Medicare Other | Admitting: Podiatry

## 2021-02-14 DIAGNOSIS — E875 Hyperkalemia: Secondary | ICD-10-CM | POA: Diagnosis not present

## 2021-02-14 DIAGNOSIS — Z992 Dependence on renal dialysis: Secondary | ICD-10-CM | POA: Diagnosis not present

## 2021-02-14 DIAGNOSIS — N2581 Secondary hyperparathyroidism of renal origin: Secondary | ICD-10-CM | POA: Diagnosis not present

## 2021-02-14 DIAGNOSIS — D689 Coagulation defect, unspecified: Secondary | ICD-10-CM | POA: Diagnosis not present

## 2021-02-14 DIAGNOSIS — D631 Anemia in chronic kidney disease: Secondary | ICD-10-CM | POA: Diagnosis not present

## 2021-02-14 DIAGNOSIS — N186 End stage renal disease: Secondary | ICD-10-CM | POA: Diagnosis not present

## 2021-02-16 DIAGNOSIS — N2581 Secondary hyperparathyroidism of renal origin: Secondary | ICD-10-CM | POA: Diagnosis not present

## 2021-02-16 DIAGNOSIS — D689 Coagulation defect, unspecified: Secondary | ICD-10-CM | POA: Diagnosis not present

## 2021-02-16 DIAGNOSIS — E875 Hyperkalemia: Secondary | ICD-10-CM | POA: Diagnosis not present

## 2021-02-16 DIAGNOSIS — N186 End stage renal disease: Secondary | ICD-10-CM | POA: Diagnosis not present

## 2021-02-16 DIAGNOSIS — Z992 Dependence on renal dialysis: Secondary | ICD-10-CM | POA: Diagnosis not present

## 2021-02-16 DIAGNOSIS — D631 Anemia in chronic kidney disease: Secondary | ICD-10-CM | POA: Diagnosis not present

## 2021-02-17 ENCOUNTER — Ambulatory Visit (INDEPENDENT_AMBULATORY_CARE_PROVIDER_SITE_OTHER): Payer: Medicare Other

## 2021-02-17 ENCOUNTER — Ambulatory Visit (INDEPENDENT_AMBULATORY_CARE_PROVIDER_SITE_OTHER): Payer: Medicare Other | Admitting: Podiatry

## 2021-02-17 ENCOUNTER — Other Ambulatory Visit: Payer: Self-pay

## 2021-02-17 DIAGNOSIS — M7989 Other specified soft tissue disorders: Secondary | ICD-10-CM | POA: Diagnosis not present

## 2021-02-17 DIAGNOSIS — S90852A Superficial foreign body, left foot, initial encounter: Secondary | ICD-10-CM

## 2021-02-17 DIAGNOSIS — M19072 Primary osteoarthritis, left ankle and foot: Secondary | ICD-10-CM | POA: Diagnosis not present

## 2021-02-17 DIAGNOSIS — M7732 Calcaneal spur, left foot: Secondary | ICD-10-CM | POA: Diagnosis not present

## 2021-02-17 DIAGNOSIS — M2012 Hallux valgus (acquired), left foot: Secondary | ICD-10-CM | POA: Diagnosis not present

## 2021-02-18 DIAGNOSIS — D689 Coagulation defect, unspecified: Secondary | ICD-10-CM | POA: Diagnosis not present

## 2021-02-18 DIAGNOSIS — D631 Anemia in chronic kidney disease: Secondary | ICD-10-CM | POA: Diagnosis not present

## 2021-02-18 DIAGNOSIS — N2581 Secondary hyperparathyroidism of renal origin: Secondary | ICD-10-CM | POA: Diagnosis not present

## 2021-02-18 DIAGNOSIS — N186 End stage renal disease: Secondary | ICD-10-CM | POA: Diagnosis not present

## 2021-02-18 DIAGNOSIS — Z992 Dependence on renal dialysis: Secondary | ICD-10-CM | POA: Diagnosis not present

## 2021-02-18 DIAGNOSIS — E875 Hyperkalemia: Secondary | ICD-10-CM | POA: Diagnosis not present

## 2021-02-20 DIAGNOSIS — N186 End stage renal disease: Secondary | ICD-10-CM | POA: Diagnosis not present

## 2021-02-20 DIAGNOSIS — N2581 Secondary hyperparathyroidism of renal origin: Secondary | ICD-10-CM | POA: Diagnosis not present

## 2021-02-20 DIAGNOSIS — D689 Coagulation defect, unspecified: Secondary | ICD-10-CM | POA: Diagnosis not present

## 2021-02-20 DIAGNOSIS — Z992 Dependence on renal dialysis: Secondary | ICD-10-CM | POA: Diagnosis not present

## 2021-02-21 DIAGNOSIS — H2511 Age-related nuclear cataract, right eye: Secondary | ICD-10-CM | POA: Diagnosis not present

## 2021-02-21 DIAGNOSIS — Z992 Dependence on renal dialysis: Secondary | ICD-10-CM | POA: Diagnosis not present

## 2021-02-21 DIAGNOSIS — N186 End stage renal disease: Secondary | ICD-10-CM | POA: Diagnosis not present

## 2021-02-21 DIAGNOSIS — H25041 Posterior subcapsular polar age-related cataract, right eye: Secondary | ICD-10-CM | POA: Diagnosis not present

## 2021-02-21 DIAGNOSIS — H25811 Combined forms of age-related cataract, right eye: Secondary | ICD-10-CM | POA: Diagnosis not present

## 2021-02-21 NOTE — Progress Notes (Signed)
Subjective: 65 year old male presents the office today for concerns of recurrent callus on bottom of his left foot.  He states been trimmed weight and since come back causing discomfort.  Denies stepping on any foreign objects but he states that he may have stepped on something and not known it.  He has not seen any swelling or redness.  No drainage. Denies any systemic complaints such as fevers, chills, nausea, vomiting. No acute changes since last appointment, and no other complaints at this time.   Objective: AAO x3, NAD DP/PT pulses palpable bilaterally, CRT less than 3 seconds On the plantar aspect left foot is an annular hyperkeratotic lesion.  Upon debridement there is no underlying ulceration drainage or signs of infection.  No evidence of foreign body.  Tenderness palpation of the lesion but no other areas of discomfort identified today. No pain with calf compression, swelling, warmth, erythema  Assessment: Hyperkeratotic lesion left foot  Plan: -All treatment options discussed with the patient including all alternatives, risks, complications.  -Sharply debrided hyperkeratotic lesion without any complications.  Dispensed offloading pads.  I ordered an x-ray to rule out any underlying foreign objects or osseous abnormality that is concerning to this. -Patient encouraged to call the office with any questions, concerns, change in symptoms.   Trula Slade DPM

## 2021-02-23 DIAGNOSIS — Z992 Dependence on renal dialysis: Secondary | ICD-10-CM | POA: Diagnosis not present

## 2021-02-23 DIAGNOSIS — N2581 Secondary hyperparathyroidism of renal origin: Secondary | ICD-10-CM | POA: Diagnosis not present

## 2021-02-23 DIAGNOSIS — N186 End stage renal disease: Secondary | ICD-10-CM | POA: Diagnosis not present

## 2021-02-23 DIAGNOSIS — D689 Coagulation defect, unspecified: Secondary | ICD-10-CM | POA: Diagnosis not present

## 2021-02-24 DIAGNOSIS — Z992 Dependence on renal dialysis: Secondary | ICD-10-CM | POA: Diagnosis not present

## 2021-02-24 DIAGNOSIS — D689 Coagulation defect, unspecified: Secondary | ICD-10-CM | POA: Diagnosis not present

## 2021-02-24 DIAGNOSIS — N2581 Secondary hyperparathyroidism of renal origin: Secondary | ICD-10-CM | POA: Diagnosis not present

## 2021-02-24 DIAGNOSIS — N186 End stage renal disease: Secondary | ICD-10-CM | POA: Diagnosis not present

## 2021-02-25 DIAGNOSIS — N186 End stage renal disease: Secondary | ICD-10-CM | POA: Diagnosis not present

## 2021-02-25 DIAGNOSIS — Z992 Dependence on renal dialysis: Secondary | ICD-10-CM | POA: Diagnosis not present

## 2021-02-25 DIAGNOSIS — D689 Coagulation defect, unspecified: Secondary | ICD-10-CM | POA: Diagnosis not present

## 2021-02-25 DIAGNOSIS — N2581 Secondary hyperparathyroidism of renal origin: Secondary | ICD-10-CM | POA: Diagnosis not present

## 2021-02-28 DIAGNOSIS — D631 Anemia in chronic kidney disease: Secondary | ICD-10-CM | POA: Diagnosis not present

## 2021-02-28 DIAGNOSIS — D689 Coagulation defect, unspecified: Secondary | ICD-10-CM | POA: Diagnosis not present

## 2021-02-28 DIAGNOSIS — Z992 Dependence on renal dialysis: Secondary | ICD-10-CM | POA: Diagnosis not present

## 2021-02-28 DIAGNOSIS — N186 End stage renal disease: Secondary | ICD-10-CM | POA: Diagnosis not present

## 2021-02-28 DIAGNOSIS — N2581 Secondary hyperparathyroidism of renal origin: Secondary | ICD-10-CM | POA: Diagnosis not present

## 2021-03-02 DIAGNOSIS — N186 End stage renal disease: Secondary | ICD-10-CM | POA: Diagnosis not present

## 2021-03-02 DIAGNOSIS — D631 Anemia in chronic kidney disease: Secondary | ICD-10-CM | POA: Diagnosis not present

## 2021-03-02 DIAGNOSIS — N2581 Secondary hyperparathyroidism of renal origin: Secondary | ICD-10-CM | POA: Diagnosis not present

## 2021-03-02 DIAGNOSIS — D689 Coagulation defect, unspecified: Secondary | ICD-10-CM | POA: Diagnosis not present

## 2021-03-02 DIAGNOSIS — Z992 Dependence on renal dialysis: Secondary | ICD-10-CM | POA: Diagnosis not present

## 2021-03-04 DIAGNOSIS — N2581 Secondary hyperparathyroidism of renal origin: Secondary | ICD-10-CM | POA: Diagnosis not present

## 2021-03-04 DIAGNOSIS — N186 End stage renal disease: Secondary | ICD-10-CM | POA: Diagnosis not present

## 2021-03-04 DIAGNOSIS — D631 Anemia in chronic kidney disease: Secondary | ICD-10-CM | POA: Diagnosis not present

## 2021-03-04 DIAGNOSIS — D689 Coagulation defect, unspecified: Secondary | ICD-10-CM | POA: Diagnosis not present

## 2021-03-04 DIAGNOSIS — Z992 Dependence on renal dialysis: Secondary | ICD-10-CM | POA: Diagnosis not present

## 2021-03-07 DIAGNOSIS — D689 Coagulation defect, unspecified: Secondary | ICD-10-CM | POA: Diagnosis not present

## 2021-03-07 DIAGNOSIS — N186 End stage renal disease: Secondary | ICD-10-CM | POA: Diagnosis not present

## 2021-03-07 DIAGNOSIS — E875 Hyperkalemia: Secondary | ICD-10-CM | POA: Diagnosis not present

## 2021-03-07 DIAGNOSIS — Z992 Dependence on renal dialysis: Secondary | ICD-10-CM | POA: Diagnosis not present

## 2021-03-07 DIAGNOSIS — N2581 Secondary hyperparathyroidism of renal origin: Secondary | ICD-10-CM | POA: Diagnosis not present

## 2021-03-09 DIAGNOSIS — N186 End stage renal disease: Secondary | ICD-10-CM | POA: Diagnosis not present

## 2021-03-09 DIAGNOSIS — Z992 Dependence on renal dialysis: Secondary | ICD-10-CM | POA: Diagnosis not present

## 2021-03-09 DIAGNOSIS — N2581 Secondary hyperparathyroidism of renal origin: Secondary | ICD-10-CM | POA: Diagnosis not present

## 2021-03-09 DIAGNOSIS — D689 Coagulation defect, unspecified: Secondary | ICD-10-CM | POA: Diagnosis not present

## 2021-03-09 DIAGNOSIS — E875 Hyperkalemia: Secondary | ICD-10-CM | POA: Diagnosis not present

## 2021-03-11 DIAGNOSIS — E875 Hyperkalemia: Secondary | ICD-10-CM | POA: Diagnosis not present

## 2021-03-11 DIAGNOSIS — D689 Coagulation defect, unspecified: Secondary | ICD-10-CM | POA: Diagnosis not present

## 2021-03-11 DIAGNOSIS — N186 End stage renal disease: Secondary | ICD-10-CM | POA: Diagnosis not present

## 2021-03-11 DIAGNOSIS — N2581 Secondary hyperparathyroidism of renal origin: Secondary | ICD-10-CM | POA: Diagnosis not present

## 2021-03-11 DIAGNOSIS — Z992 Dependence on renal dialysis: Secondary | ICD-10-CM | POA: Diagnosis not present

## 2021-03-13 DIAGNOSIS — E213 Hyperparathyroidism, unspecified: Secondary | ICD-10-CM | POA: Diagnosis not present

## 2021-03-13 DIAGNOSIS — G4733 Obstructive sleep apnea (adult) (pediatric): Secondary | ICD-10-CM | POA: Diagnosis not present

## 2021-03-13 DIAGNOSIS — N186 End stage renal disease: Secondary | ICD-10-CM | POA: Diagnosis not present

## 2021-03-13 DIAGNOSIS — I10 Essential (primary) hypertension: Secondary | ICD-10-CM | POA: Diagnosis not present

## 2021-03-13 DIAGNOSIS — E7800 Pure hypercholesterolemia, unspecified: Secondary | ICD-10-CM | POA: Diagnosis not present

## 2021-03-13 DIAGNOSIS — Z992 Dependence on renal dialysis: Secondary | ICD-10-CM | POA: Diagnosis not present

## 2021-03-13 DIAGNOSIS — E790 Hyperuricemia without signs of inflammatory arthritis and tophaceous disease: Secondary | ICD-10-CM | POA: Diagnosis not present

## 2021-03-13 DIAGNOSIS — I2510 Atherosclerotic heart disease of native coronary artery without angina pectoris: Secondary | ICD-10-CM | POA: Diagnosis not present

## 2021-03-13 DIAGNOSIS — K219 Gastro-esophageal reflux disease without esophagitis: Secondary | ICD-10-CM | POA: Diagnosis not present

## 2021-03-14 DIAGNOSIS — N2581 Secondary hyperparathyroidism of renal origin: Secondary | ICD-10-CM | POA: Diagnosis not present

## 2021-03-14 DIAGNOSIS — N186 End stage renal disease: Secondary | ICD-10-CM | POA: Diagnosis not present

## 2021-03-14 DIAGNOSIS — Z992 Dependence on renal dialysis: Secondary | ICD-10-CM | POA: Diagnosis not present

## 2021-03-14 DIAGNOSIS — E875 Hyperkalemia: Secondary | ICD-10-CM | POA: Diagnosis not present

## 2021-03-14 DIAGNOSIS — D689 Coagulation defect, unspecified: Secondary | ICD-10-CM | POA: Diagnosis not present

## 2021-03-14 DIAGNOSIS — D631 Anemia in chronic kidney disease: Secondary | ICD-10-CM | POA: Diagnosis not present

## 2021-03-15 DIAGNOSIS — G4733 Obstructive sleep apnea (adult) (pediatric): Secondary | ICD-10-CM | POA: Diagnosis not present

## 2021-03-15 DIAGNOSIS — E213 Hyperparathyroidism, unspecified: Secondary | ICD-10-CM | POA: Diagnosis not present

## 2021-03-15 DIAGNOSIS — K219 Gastro-esophageal reflux disease without esophagitis: Secondary | ICD-10-CM | POA: Diagnosis not present

## 2021-03-15 DIAGNOSIS — I10 Essential (primary) hypertension: Secondary | ICD-10-CM | POA: Diagnosis not present

## 2021-03-15 DIAGNOSIS — E7800 Pure hypercholesterolemia, unspecified: Secondary | ICD-10-CM | POA: Diagnosis not present

## 2021-03-15 DIAGNOSIS — R7303 Prediabetes: Secondary | ICD-10-CM | POA: Diagnosis not present

## 2021-03-15 DIAGNOSIS — Z992 Dependence on renal dialysis: Secondary | ICD-10-CM | POA: Diagnosis not present

## 2021-03-15 DIAGNOSIS — E790 Hyperuricemia without signs of inflammatory arthritis and tophaceous disease: Secondary | ICD-10-CM | POA: Diagnosis not present

## 2021-03-16 DIAGNOSIS — D631 Anemia in chronic kidney disease: Secondary | ICD-10-CM | POA: Diagnosis not present

## 2021-03-16 DIAGNOSIS — N2581 Secondary hyperparathyroidism of renal origin: Secondary | ICD-10-CM | POA: Diagnosis not present

## 2021-03-16 DIAGNOSIS — N186 End stage renal disease: Secondary | ICD-10-CM | POA: Diagnosis not present

## 2021-03-16 DIAGNOSIS — Z992 Dependence on renal dialysis: Secondary | ICD-10-CM | POA: Diagnosis not present

## 2021-03-16 DIAGNOSIS — E875 Hyperkalemia: Secondary | ICD-10-CM | POA: Diagnosis not present

## 2021-03-16 DIAGNOSIS — D689 Coagulation defect, unspecified: Secondary | ICD-10-CM | POA: Diagnosis not present

## 2021-03-17 DIAGNOSIS — N186 End stage renal disease: Secondary | ICD-10-CM | POA: Diagnosis not present

## 2021-03-17 DIAGNOSIS — T82858A Stenosis of vascular prosthetic devices, implants and grafts, initial encounter: Secondary | ICD-10-CM | POA: Diagnosis not present

## 2021-03-17 DIAGNOSIS — I871 Compression of vein: Secondary | ICD-10-CM | POA: Diagnosis not present

## 2021-03-17 DIAGNOSIS — Z992 Dependence on renal dialysis: Secondary | ICD-10-CM | POA: Diagnosis not present

## 2021-03-18 DIAGNOSIS — N186 End stage renal disease: Secondary | ICD-10-CM | POA: Diagnosis not present

## 2021-03-18 DIAGNOSIS — Z992 Dependence on renal dialysis: Secondary | ICD-10-CM | POA: Diagnosis not present

## 2021-03-18 DIAGNOSIS — E875 Hyperkalemia: Secondary | ICD-10-CM | POA: Diagnosis not present

## 2021-03-18 DIAGNOSIS — N2581 Secondary hyperparathyroidism of renal origin: Secondary | ICD-10-CM | POA: Diagnosis not present

## 2021-03-18 DIAGNOSIS — D631 Anemia in chronic kidney disease: Secondary | ICD-10-CM | POA: Diagnosis not present

## 2021-03-18 DIAGNOSIS — D689 Coagulation defect, unspecified: Secondary | ICD-10-CM | POA: Diagnosis not present

## 2021-03-20 DIAGNOSIS — H2512 Age-related nuclear cataract, left eye: Secondary | ICD-10-CM | POA: Diagnosis not present

## 2021-03-20 DIAGNOSIS — N186 End stage renal disease: Secondary | ICD-10-CM | POA: Diagnosis not present

## 2021-03-20 DIAGNOSIS — I2510 Atherosclerotic heart disease of native coronary artery without angina pectoris: Secondary | ICD-10-CM | POA: Diagnosis not present

## 2021-03-20 DIAGNOSIS — H25012 Cortical age-related cataract, left eye: Secondary | ICD-10-CM | POA: Diagnosis not present

## 2021-03-20 DIAGNOSIS — G4733 Obstructive sleep apnea (adult) (pediatric): Secondary | ICD-10-CM | POA: Diagnosis not present

## 2021-03-21 DIAGNOSIS — N186 End stage renal disease: Secondary | ICD-10-CM | POA: Diagnosis not present

## 2021-03-21 DIAGNOSIS — D689 Coagulation defect, unspecified: Secondary | ICD-10-CM | POA: Diagnosis not present

## 2021-03-21 DIAGNOSIS — N2581 Secondary hyperparathyroidism of renal origin: Secondary | ICD-10-CM | POA: Diagnosis not present

## 2021-03-21 DIAGNOSIS — E875 Hyperkalemia: Secondary | ICD-10-CM | POA: Diagnosis not present

## 2021-03-21 DIAGNOSIS — Z992 Dependence on renal dialysis: Secondary | ICD-10-CM | POA: Diagnosis not present

## 2021-03-23 DIAGNOSIS — N2581 Secondary hyperparathyroidism of renal origin: Secondary | ICD-10-CM | POA: Diagnosis not present

## 2021-03-23 DIAGNOSIS — Z992 Dependence on renal dialysis: Secondary | ICD-10-CM | POA: Diagnosis not present

## 2021-03-23 DIAGNOSIS — D689 Coagulation defect, unspecified: Secondary | ICD-10-CM | POA: Diagnosis not present

## 2021-03-23 DIAGNOSIS — E875 Hyperkalemia: Secondary | ICD-10-CM | POA: Diagnosis not present

## 2021-03-23 DIAGNOSIS — N186 End stage renal disease: Secondary | ICD-10-CM | POA: Diagnosis not present

## 2021-03-25 DIAGNOSIS — Z23 Encounter for immunization: Secondary | ICD-10-CM | POA: Diagnosis not present

## 2021-03-25 DIAGNOSIS — D689 Coagulation defect, unspecified: Secondary | ICD-10-CM | POA: Diagnosis not present

## 2021-03-25 DIAGNOSIS — E875 Hyperkalemia: Secondary | ICD-10-CM | POA: Diagnosis not present

## 2021-03-25 DIAGNOSIS — N2581 Secondary hyperparathyroidism of renal origin: Secondary | ICD-10-CM | POA: Diagnosis not present

## 2021-03-25 DIAGNOSIS — Z992 Dependence on renal dialysis: Secondary | ICD-10-CM | POA: Diagnosis not present

## 2021-03-25 DIAGNOSIS — N186 End stage renal disease: Secondary | ICD-10-CM | POA: Diagnosis not present

## 2021-03-28 DIAGNOSIS — N2581 Secondary hyperparathyroidism of renal origin: Secondary | ICD-10-CM | POA: Diagnosis not present

## 2021-03-28 DIAGNOSIS — N186 End stage renal disease: Secondary | ICD-10-CM | POA: Diagnosis not present

## 2021-03-28 DIAGNOSIS — E875 Hyperkalemia: Secondary | ICD-10-CM | POA: Diagnosis not present

## 2021-03-28 DIAGNOSIS — Z23 Encounter for immunization: Secondary | ICD-10-CM | POA: Diagnosis not present

## 2021-03-28 DIAGNOSIS — D689 Coagulation defect, unspecified: Secondary | ICD-10-CM | POA: Diagnosis not present

## 2021-03-28 DIAGNOSIS — Z992 Dependence on renal dialysis: Secondary | ICD-10-CM | POA: Diagnosis not present

## 2021-03-30 DIAGNOSIS — N186 End stage renal disease: Secondary | ICD-10-CM | POA: Diagnosis not present

## 2021-03-30 DIAGNOSIS — N2581 Secondary hyperparathyroidism of renal origin: Secondary | ICD-10-CM | POA: Diagnosis not present

## 2021-03-30 DIAGNOSIS — Z992 Dependence on renal dialysis: Secondary | ICD-10-CM | POA: Diagnosis not present

## 2021-03-30 DIAGNOSIS — E875 Hyperkalemia: Secondary | ICD-10-CM | POA: Diagnosis not present

## 2021-03-30 DIAGNOSIS — Z23 Encounter for immunization: Secondary | ICD-10-CM | POA: Diagnosis not present

## 2021-03-30 DIAGNOSIS — D689 Coagulation defect, unspecified: Secondary | ICD-10-CM | POA: Diagnosis not present

## 2021-03-30 DIAGNOSIS — G4733 Obstructive sleep apnea (adult) (pediatric): Secondary | ICD-10-CM | POA: Diagnosis not present

## 2021-04-01 DIAGNOSIS — N2581 Secondary hyperparathyroidism of renal origin: Secondary | ICD-10-CM | POA: Diagnosis not present

## 2021-04-01 DIAGNOSIS — N186 End stage renal disease: Secondary | ICD-10-CM | POA: Diagnosis not present

## 2021-04-01 DIAGNOSIS — Z23 Encounter for immunization: Secondary | ICD-10-CM | POA: Diagnosis not present

## 2021-04-01 DIAGNOSIS — Z992 Dependence on renal dialysis: Secondary | ICD-10-CM | POA: Diagnosis not present

## 2021-04-01 DIAGNOSIS — E875 Hyperkalemia: Secondary | ICD-10-CM | POA: Diagnosis not present

## 2021-04-01 DIAGNOSIS — D689 Coagulation defect, unspecified: Secondary | ICD-10-CM | POA: Diagnosis not present

## 2021-04-04 DIAGNOSIS — H25812 Combined forms of age-related cataract, left eye: Secondary | ICD-10-CM | POA: Diagnosis not present

## 2021-04-04 DIAGNOSIS — D689 Coagulation defect, unspecified: Secondary | ICD-10-CM | POA: Diagnosis not present

## 2021-04-04 DIAGNOSIS — E875 Hyperkalemia: Secondary | ICD-10-CM | POA: Diagnosis not present

## 2021-04-04 DIAGNOSIS — Z992 Dependence on renal dialysis: Secondary | ICD-10-CM | POA: Diagnosis not present

## 2021-04-04 DIAGNOSIS — N186 End stage renal disease: Secondary | ICD-10-CM | POA: Diagnosis not present

## 2021-04-04 DIAGNOSIS — N2581 Secondary hyperparathyroidism of renal origin: Secondary | ICD-10-CM | POA: Diagnosis not present

## 2021-04-04 DIAGNOSIS — Z23 Encounter for immunization: Secondary | ICD-10-CM | POA: Diagnosis not present

## 2021-04-04 DIAGNOSIS — H2512 Age-related nuclear cataract, left eye: Secondary | ICD-10-CM | POA: Diagnosis not present

## 2021-04-04 DIAGNOSIS — H25012 Cortical age-related cataract, left eye: Secondary | ICD-10-CM | POA: Diagnosis not present

## 2021-04-06 DIAGNOSIS — D689 Coagulation defect, unspecified: Secondary | ICD-10-CM | POA: Diagnosis not present

## 2021-04-06 DIAGNOSIS — Z23 Encounter for immunization: Secondary | ICD-10-CM | POA: Diagnosis not present

## 2021-04-06 DIAGNOSIS — N186 End stage renal disease: Secondary | ICD-10-CM | POA: Diagnosis not present

## 2021-04-06 DIAGNOSIS — E875 Hyperkalemia: Secondary | ICD-10-CM | POA: Diagnosis not present

## 2021-04-06 DIAGNOSIS — Z992 Dependence on renal dialysis: Secondary | ICD-10-CM | POA: Diagnosis not present

## 2021-04-06 DIAGNOSIS — N2581 Secondary hyperparathyroidism of renal origin: Secondary | ICD-10-CM | POA: Diagnosis not present

## 2021-04-08 DIAGNOSIS — D689 Coagulation defect, unspecified: Secondary | ICD-10-CM | POA: Diagnosis not present

## 2021-04-08 DIAGNOSIS — N2581 Secondary hyperparathyroidism of renal origin: Secondary | ICD-10-CM | POA: Diagnosis not present

## 2021-04-08 DIAGNOSIS — E875 Hyperkalemia: Secondary | ICD-10-CM | POA: Diagnosis not present

## 2021-04-08 DIAGNOSIS — Z992 Dependence on renal dialysis: Secondary | ICD-10-CM | POA: Diagnosis not present

## 2021-04-08 DIAGNOSIS — Z23 Encounter for immunization: Secondary | ICD-10-CM | POA: Diagnosis not present

## 2021-04-08 DIAGNOSIS — N186 End stage renal disease: Secondary | ICD-10-CM | POA: Diagnosis not present

## 2021-04-11 DIAGNOSIS — E875 Hyperkalemia: Secondary | ICD-10-CM | POA: Diagnosis not present

## 2021-04-11 DIAGNOSIS — N2581 Secondary hyperparathyroidism of renal origin: Secondary | ICD-10-CM | POA: Diagnosis not present

## 2021-04-11 DIAGNOSIS — Z23 Encounter for immunization: Secondary | ICD-10-CM | POA: Diagnosis not present

## 2021-04-11 DIAGNOSIS — N186 End stage renal disease: Secondary | ICD-10-CM | POA: Diagnosis not present

## 2021-04-11 DIAGNOSIS — Z992 Dependence on renal dialysis: Secondary | ICD-10-CM | POA: Diagnosis not present

## 2021-04-11 DIAGNOSIS — D689 Coagulation defect, unspecified: Secondary | ICD-10-CM | POA: Diagnosis not present

## 2021-04-13 DIAGNOSIS — E875 Hyperkalemia: Secondary | ICD-10-CM | POA: Diagnosis not present

## 2021-04-13 DIAGNOSIS — Z992 Dependence on renal dialysis: Secondary | ICD-10-CM | POA: Diagnosis not present

## 2021-04-13 DIAGNOSIS — N186 End stage renal disease: Secondary | ICD-10-CM | POA: Diagnosis not present

## 2021-04-13 DIAGNOSIS — Z23 Encounter for immunization: Secondary | ICD-10-CM | POA: Diagnosis not present

## 2021-04-13 DIAGNOSIS — N2581 Secondary hyperparathyroidism of renal origin: Secondary | ICD-10-CM | POA: Diagnosis not present

## 2021-04-13 DIAGNOSIS — D689 Coagulation defect, unspecified: Secondary | ICD-10-CM | POA: Diagnosis not present

## 2021-04-15 DIAGNOSIS — Z992 Dependence on renal dialysis: Secondary | ICD-10-CM | POA: Diagnosis not present

## 2021-04-15 DIAGNOSIS — N186 End stage renal disease: Secondary | ICD-10-CM | POA: Diagnosis not present

## 2021-04-15 DIAGNOSIS — N2581 Secondary hyperparathyroidism of renal origin: Secondary | ICD-10-CM | POA: Diagnosis not present

## 2021-04-15 DIAGNOSIS — Z23 Encounter for immunization: Secondary | ICD-10-CM | POA: Diagnosis not present

## 2021-04-15 DIAGNOSIS — D689 Coagulation defect, unspecified: Secondary | ICD-10-CM | POA: Diagnosis not present

## 2021-04-15 DIAGNOSIS — E875 Hyperkalemia: Secondary | ICD-10-CM | POA: Diagnosis not present

## 2021-04-18 DIAGNOSIS — D689 Coagulation defect, unspecified: Secondary | ICD-10-CM | POA: Diagnosis not present

## 2021-04-18 DIAGNOSIS — Z992 Dependence on renal dialysis: Secondary | ICD-10-CM | POA: Diagnosis not present

## 2021-04-18 DIAGNOSIS — N2581 Secondary hyperparathyroidism of renal origin: Secondary | ICD-10-CM | POA: Diagnosis not present

## 2021-04-18 DIAGNOSIS — E875 Hyperkalemia: Secondary | ICD-10-CM | POA: Diagnosis not present

## 2021-04-18 DIAGNOSIS — Z23 Encounter for immunization: Secondary | ICD-10-CM | POA: Diagnosis not present

## 2021-04-18 DIAGNOSIS — N186 End stage renal disease: Secondary | ICD-10-CM | POA: Diagnosis not present

## 2021-04-20 DIAGNOSIS — Z992 Dependence on renal dialysis: Secondary | ICD-10-CM | POA: Diagnosis not present

## 2021-04-20 DIAGNOSIS — N2581 Secondary hyperparathyroidism of renal origin: Secondary | ICD-10-CM | POA: Diagnosis not present

## 2021-04-20 DIAGNOSIS — Z23 Encounter for immunization: Secondary | ICD-10-CM | POA: Diagnosis not present

## 2021-04-20 DIAGNOSIS — N186 End stage renal disease: Secondary | ICD-10-CM | POA: Diagnosis not present

## 2021-04-20 DIAGNOSIS — E875 Hyperkalemia: Secondary | ICD-10-CM | POA: Diagnosis not present

## 2021-04-20 DIAGNOSIS — D689 Coagulation defect, unspecified: Secondary | ICD-10-CM | POA: Diagnosis not present

## 2021-04-22 DIAGNOSIS — D689 Coagulation defect, unspecified: Secondary | ICD-10-CM | POA: Diagnosis not present

## 2021-04-22 DIAGNOSIS — N2581 Secondary hyperparathyroidism of renal origin: Secondary | ICD-10-CM | POA: Diagnosis not present

## 2021-04-22 DIAGNOSIS — N186 End stage renal disease: Secondary | ICD-10-CM | POA: Diagnosis not present

## 2021-04-22 DIAGNOSIS — E875 Hyperkalemia: Secondary | ICD-10-CM | POA: Diagnosis not present

## 2021-04-22 DIAGNOSIS — Z23 Encounter for immunization: Secondary | ICD-10-CM | POA: Diagnosis not present

## 2021-04-22 DIAGNOSIS — Z992 Dependence on renal dialysis: Secondary | ICD-10-CM | POA: Diagnosis not present

## 2021-04-23 DIAGNOSIS — Z992 Dependence on renal dialysis: Secondary | ICD-10-CM | POA: Diagnosis not present

## 2021-04-23 DIAGNOSIS — N186 End stage renal disease: Secondary | ICD-10-CM | POA: Diagnosis not present

## 2021-04-24 DIAGNOSIS — G4733 Obstructive sleep apnea (adult) (pediatric): Secondary | ICD-10-CM | POA: Diagnosis not present

## 2021-04-25 DIAGNOSIS — D689 Coagulation defect, unspecified: Secondary | ICD-10-CM | POA: Diagnosis not present

## 2021-04-25 DIAGNOSIS — N186 End stage renal disease: Secondary | ICD-10-CM | POA: Diagnosis not present

## 2021-04-25 DIAGNOSIS — E875 Hyperkalemia: Secondary | ICD-10-CM | POA: Diagnosis not present

## 2021-04-25 DIAGNOSIS — Z992 Dependence on renal dialysis: Secondary | ICD-10-CM | POA: Diagnosis not present

## 2021-04-25 DIAGNOSIS — N2581 Secondary hyperparathyroidism of renal origin: Secondary | ICD-10-CM | POA: Diagnosis not present

## 2021-04-25 DIAGNOSIS — D631 Anemia in chronic kidney disease: Secondary | ICD-10-CM | POA: Diagnosis not present

## 2021-04-27 DIAGNOSIS — N186 End stage renal disease: Secondary | ICD-10-CM | POA: Diagnosis not present

## 2021-04-27 DIAGNOSIS — N2581 Secondary hyperparathyroidism of renal origin: Secondary | ICD-10-CM | POA: Diagnosis not present

## 2021-04-27 DIAGNOSIS — Z992 Dependence on renal dialysis: Secondary | ICD-10-CM | POA: Diagnosis not present

## 2021-04-27 DIAGNOSIS — D631 Anemia in chronic kidney disease: Secondary | ICD-10-CM | POA: Diagnosis not present

## 2021-04-27 DIAGNOSIS — D689 Coagulation defect, unspecified: Secondary | ICD-10-CM | POA: Diagnosis not present

## 2021-04-27 DIAGNOSIS — E875 Hyperkalemia: Secondary | ICD-10-CM | POA: Diagnosis not present

## 2021-04-28 DIAGNOSIS — N186 End stage renal disease: Secondary | ICD-10-CM | POA: Diagnosis not present

## 2021-04-28 DIAGNOSIS — D631 Anemia in chronic kidney disease: Secondary | ICD-10-CM | POA: Diagnosis not present

## 2021-04-28 DIAGNOSIS — E875 Hyperkalemia: Secondary | ICD-10-CM | POA: Diagnosis not present

## 2021-04-28 DIAGNOSIS — N2581 Secondary hyperparathyroidism of renal origin: Secondary | ICD-10-CM | POA: Diagnosis not present

## 2021-04-28 DIAGNOSIS — Z992 Dependence on renal dialysis: Secondary | ICD-10-CM | POA: Diagnosis not present

## 2021-04-28 DIAGNOSIS — D689 Coagulation defect, unspecified: Secondary | ICD-10-CM | POA: Diagnosis not present

## 2021-05-02 DIAGNOSIS — D631 Anemia in chronic kidney disease: Secondary | ICD-10-CM | POA: Diagnosis not present

## 2021-05-02 DIAGNOSIS — D689 Coagulation defect, unspecified: Secondary | ICD-10-CM | POA: Diagnosis not present

## 2021-05-02 DIAGNOSIS — N186 End stage renal disease: Secondary | ICD-10-CM | POA: Diagnosis not present

## 2021-05-02 DIAGNOSIS — N2581 Secondary hyperparathyroidism of renal origin: Secondary | ICD-10-CM | POA: Diagnosis not present

## 2021-05-02 DIAGNOSIS — Z992 Dependence on renal dialysis: Secondary | ICD-10-CM | POA: Diagnosis not present

## 2021-05-02 DIAGNOSIS — E875 Hyperkalemia: Secondary | ICD-10-CM | POA: Diagnosis not present

## 2021-05-04 DIAGNOSIS — D631 Anemia in chronic kidney disease: Secondary | ICD-10-CM | POA: Diagnosis not present

## 2021-05-04 DIAGNOSIS — N186 End stage renal disease: Secondary | ICD-10-CM | POA: Diagnosis not present

## 2021-05-04 DIAGNOSIS — I10 Essential (primary) hypertension: Secondary | ICD-10-CM | POA: Diagnosis not present

## 2021-05-04 DIAGNOSIS — I129 Hypertensive chronic kidney disease with stage 1 through stage 4 chronic kidney disease, or unspecified chronic kidney disease: Secondary | ICD-10-CM | POA: Diagnosis not present

## 2021-05-04 DIAGNOSIS — Z992 Dependence on renal dialysis: Secondary | ICD-10-CM | POA: Diagnosis not present

## 2021-05-06 DIAGNOSIS — N186 End stage renal disease: Secondary | ICD-10-CM | POA: Diagnosis not present

## 2021-05-06 DIAGNOSIS — Z992 Dependence on renal dialysis: Secondary | ICD-10-CM | POA: Diagnosis not present

## 2021-05-09 DIAGNOSIS — N2581 Secondary hyperparathyroidism of renal origin: Secondary | ICD-10-CM | POA: Diagnosis not present

## 2021-05-09 DIAGNOSIS — D631 Anemia in chronic kidney disease: Secondary | ICD-10-CM | POA: Diagnosis not present

## 2021-05-09 DIAGNOSIS — N186 End stage renal disease: Secondary | ICD-10-CM | POA: Diagnosis not present

## 2021-05-09 DIAGNOSIS — E875 Hyperkalemia: Secondary | ICD-10-CM | POA: Diagnosis not present

## 2021-05-09 DIAGNOSIS — D689 Coagulation defect, unspecified: Secondary | ICD-10-CM | POA: Diagnosis not present

## 2021-05-09 DIAGNOSIS — Z992 Dependence on renal dialysis: Secondary | ICD-10-CM | POA: Diagnosis not present

## 2021-05-11 DIAGNOSIS — N2581 Secondary hyperparathyroidism of renal origin: Secondary | ICD-10-CM | POA: Diagnosis not present

## 2021-05-11 DIAGNOSIS — D631 Anemia in chronic kidney disease: Secondary | ICD-10-CM | POA: Diagnosis not present

## 2021-05-11 DIAGNOSIS — N186 End stage renal disease: Secondary | ICD-10-CM | POA: Diagnosis not present

## 2021-05-11 DIAGNOSIS — Z992 Dependence on renal dialysis: Secondary | ICD-10-CM | POA: Diagnosis not present

## 2021-05-11 DIAGNOSIS — E875 Hyperkalemia: Secondary | ICD-10-CM | POA: Diagnosis not present

## 2021-05-11 DIAGNOSIS — D689 Coagulation defect, unspecified: Secondary | ICD-10-CM | POA: Diagnosis not present

## 2021-05-13 DIAGNOSIS — Z992 Dependence on renal dialysis: Secondary | ICD-10-CM | POA: Diagnosis not present

## 2021-05-13 DIAGNOSIS — E875 Hyperkalemia: Secondary | ICD-10-CM | POA: Diagnosis not present

## 2021-05-13 DIAGNOSIS — N2581 Secondary hyperparathyroidism of renal origin: Secondary | ICD-10-CM | POA: Diagnosis not present

## 2021-05-13 DIAGNOSIS — D689 Coagulation defect, unspecified: Secondary | ICD-10-CM | POA: Diagnosis not present

## 2021-05-13 DIAGNOSIS — D631 Anemia in chronic kidney disease: Secondary | ICD-10-CM | POA: Diagnosis not present

## 2021-05-13 DIAGNOSIS — N186 End stage renal disease: Secondary | ICD-10-CM | POA: Diagnosis not present

## 2021-05-16 DIAGNOSIS — E875 Hyperkalemia: Secondary | ICD-10-CM | POA: Diagnosis not present

## 2021-05-16 DIAGNOSIS — D631 Anemia in chronic kidney disease: Secondary | ICD-10-CM | POA: Diagnosis not present

## 2021-05-16 DIAGNOSIS — Z992 Dependence on renal dialysis: Secondary | ICD-10-CM | POA: Diagnosis not present

## 2021-05-16 DIAGNOSIS — N186 End stage renal disease: Secondary | ICD-10-CM | POA: Diagnosis not present

## 2021-05-16 DIAGNOSIS — N2581 Secondary hyperparathyroidism of renal origin: Secondary | ICD-10-CM | POA: Diagnosis not present

## 2021-05-16 DIAGNOSIS — D689 Coagulation defect, unspecified: Secondary | ICD-10-CM | POA: Diagnosis not present

## 2021-05-18 DIAGNOSIS — E875 Hyperkalemia: Secondary | ICD-10-CM | POA: Diagnosis not present

## 2021-05-18 DIAGNOSIS — D631 Anemia in chronic kidney disease: Secondary | ICD-10-CM | POA: Diagnosis not present

## 2021-05-18 DIAGNOSIS — D689 Coagulation defect, unspecified: Secondary | ICD-10-CM | POA: Diagnosis not present

## 2021-05-18 DIAGNOSIS — N2581 Secondary hyperparathyroidism of renal origin: Secondary | ICD-10-CM | POA: Diagnosis not present

## 2021-05-18 DIAGNOSIS — Z992 Dependence on renal dialysis: Secondary | ICD-10-CM | POA: Diagnosis not present

## 2021-05-18 DIAGNOSIS — N186 End stage renal disease: Secondary | ICD-10-CM | POA: Diagnosis not present

## 2021-05-20 DIAGNOSIS — N2581 Secondary hyperparathyroidism of renal origin: Secondary | ICD-10-CM | POA: Diagnosis not present

## 2021-05-20 DIAGNOSIS — D689 Coagulation defect, unspecified: Secondary | ICD-10-CM | POA: Diagnosis not present

## 2021-05-20 DIAGNOSIS — D631 Anemia in chronic kidney disease: Secondary | ICD-10-CM | POA: Diagnosis not present

## 2021-05-20 DIAGNOSIS — N186 End stage renal disease: Secondary | ICD-10-CM | POA: Diagnosis not present

## 2021-05-20 DIAGNOSIS — E875 Hyperkalemia: Secondary | ICD-10-CM | POA: Diagnosis not present

## 2021-05-20 DIAGNOSIS — Z992 Dependence on renal dialysis: Secondary | ICD-10-CM | POA: Diagnosis not present

## 2021-05-23 DIAGNOSIS — D631 Anemia in chronic kidney disease: Secondary | ICD-10-CM | POA: Diagnosis not present

## 2021-05-23 DIAGNOSIS — Z992 Dependence on renal dialysis: Secondary | ICD-10-CM | POA: Diagnosis not present

## 2021-05-23 DIAGNOSIS — N186 End stage renal disease: Secondary | ICD-10-CM | POA: Diagnosis not present

## 2021-05-23 DIAGNOSIS — I129 Hypertensive chronic kidney disease with stage 1 through stage 4 chronic kidney disease, or unspecified chronic kidney disease: Secondary | ICD-10-CM | POA: Diagnosis not present

## 2021-05-23 DIAGNOSIS — I10 Essential (primary) hypertension: Secondary | ICD-10-CM | POA: Diagnosis not present

## 2021-05-24 DIAGNOSIS — Z992 Dependence on renal dialysis: Secondary | ICD-10-CM | POA: Diagnosis not present

## 2021-05-24 DIAGNOSIS — N186 End stage renal disease: Secondary | ICD-10-CM | POA: Diagnosis not present

## 2021-05-25 DIAGNOSIS — Z992 Dependence on renal dialysis: Secondary | ICD-10-CM | POA: Diagnosis not present

## 2021-05-25 DIAGNOSIS — I129 Hypertensive chronic kidney disease with stage 1 through stage 4 chronic kidney disease, or unspecified chronic kidney disease: Secondary | ICD-10-CM | POA: Diagnosis not present

## 2021-05-25 DIAGNOSIS — D631 Anemia in chronic kidney disease: Secondary | ICD-10-CM | POA: Diagnosis not present

## 2021-05-25 DIAGNOSIS — N186 End stage renal disease: Secondary | ICD-10-CM | POA: Diagnosis not present

## 2021-05-25 DIAGNOSIS — G4733 Obstructive sleep apnea (adult) (pediatric): Secondary | ICD-10-CM | POA: Diagnosis not present

## 2021-05-25 DIAGNOSIS — I10 Essential (primary) hypertension: Secondary | ICD-10-CM | POA: Diagnosis not present

## 2021-05-27 DIAGNOSIS — I10 Essential (primary) hypertension: Secondary | ICD-10-CM | POA: Diagnosis not present

## 2021-05-27 DIAGNOSIS — I129 Hypertensive chronic kidney disease with stage 1 through stage 4 chronic kidney disease, or unspecified chronic kidney disease: Secondary | ICD-10-CM | POA: Diagnosis not present

## 2021-05-27 DIAGNOSIS — D631 Anemia in chronic kidney disease: Secondary | ICD-10-CM | POA: Diagnosis not present

## 2021-05-27 DIAGNOSIS — N186 End stage renal disease: Secondary | ICD-10-CM | POA: Diagnosis not present

## 2021-05-27 DIAGNOSIS — Z992 Dependence on renal dialysis: Secondary | ICD-10-CM | POA: Diagnosis not present

## 2021-05-29 DIAGNOSIS — N2581 Secondary hyperparathyroidism of renal origin: Secondary | ICD-10-CM | POA: Diagnosis not present

## 2021-05-29 DIAGNOSIS — D631 Anemia in chronic kidney disease: Secondary | ICD-10-CM | POA: Diagnosis not present

## 2021-05-29 DIAGNOSIS — N186 End stage renal disease: Secondary | ICD-10-CM | POA: Diagnosis not present

## 2021-05-29 DIAGNOSIS — E875 Hyperkalemia: Secondary | ICD-10-CM | POA: Diagnosis not present

## 2021-05-29 DIAGNOSIS — Z992 Dependence on renal dialysis: Secondary | ICD-10-CM | POA: Diagnosis not present

## 2021-05-29 DIAGNOSIS — D689 Coagulation defect, unspecified: Secondary | ICD-10-CM | POA: Diagnosis not present

## 2021-05-30 DIAGNOSIS — I10 Essential (primary) hypertension: Secondary | ICD-10-CM | POA: Diagnosis not present

## 2021-05-30 DIAGNOSIS — N186 End stage renal disease: Secondary | ICD-10-CM | POA: Diagnosis not present

## 2021-05-30 DIAGNOSIS — D631 Anemia in chronic kidney disease: Secondary | ICD-10-CM | POA: Diagnosis not present

## 2021-05-30 DIAGNOSIS — Z992 Dependence on renal dialysis: Secondary | ICD-10-CM | POA: Diagnosis not present

## 2021-05-30 DIAGNOSIS — I129 Hypertensive chronic kidney disease with stage 1 through stage 4 chronic kidney disease, or unspecified chronic kidney disease: Secondary | ICD-10-CM | POA: Diagnosis not present

## 2021-06-01 DIAGNOSIS — Z992 Dependence on renal dialysis: Secondary | ICD-10-CM | POA: Diagnosis not present

## 2021-06-01 DIAGNOSIS — N186 End stage renal disease: Secondary | ICD-10-CM | POA: Diagnosis not present

## 2021-06-01 DIAGNOSIS — I10 Essential (primary) hypertension: Secondary | ICD-10-CM | POA: Diagnosis not present

## 2021-06-01 DIAGNOSIS — D631 Anemia in chronic kidney disease: Secondary | ICD-10-CM | POA: Diagnosis not present

## 2021-06-01 DIAGNOSIS — I129 Hypertensive chronic kidney disease with stage 1 through stage 4 chronic kidney disease, or unspecified chronic kidney disease: Secondary | ICD-10-CM | POA: Diagnosis not present

## 2021-06-03 DIAGNOSIS — N2581 Secondary hyperparathyroidism of renal origin: Secondary | ICD-10-CM | POA: Diagnosis not present

## 2021-06-03 DIAGNOSIS — N186 End stage renal disease: Secondary | ICD-10-CM | POA: Diagnosis not present

## 2021-06-03 DIAGNOSIS — E875 Hyperkalemia: Secondary | ICD-10-CM | POA: Diagnosis not present

## 2021-06-03 DIAGNOSIS — D631 Anemia in chronic kidney disease: Secondary | ICD-10-CM | POA: Diagnosis not present

## 2021-06-03 DIAGNOSIS — Z992 Dependence on renal dialysis: Secondary | ICD-10-CM | POA: Diagnosis not present

## 2021-06-03 DIAGNOSIS — D689 Coagulation defect, unspecified: Secondary | ICD-10-CM | POA: Diagnosis not present

## 2021-06-06 DIAGNOSIS — N2581 Secondary hyperparathyroidism of renal origin: Secondary | ICD-10-CM | POA: Diagnosis not present

## 2021-06-06 DIAGNOSIS — Z992 Dependence on renal dialysis: Secondary | ICD-10-CM | POA: Diagnosis not present

## 2021-06-06 DIAGNOSIS — N186 End stage renal disease: Secondary | ICD-10-CM | POA: Diagnosis not present

## 2021-06-06 DIAGNOSIS — D631 Anemia in chronic kidney disease: Secondary | ICD-10-CM | POA: Diagnosis not present

## 2021-06-06 DIAGNOSIS — E875 Hyperkalemia: Secondary | ICD-10-CM | POA: Diagnosis not present

## 2021-06-06 DIAGNOSIS — D689 Coagulation defect, unspecified: Secondary | ICD-10-CM | POA: Diagnosis not present

## 2021-06-07 DIAGNOSIS — H00025 Hordeolum internum left lower eyelid: Secondary | ICD-10-CM | POA: Diagnosis not present

## 2021-06-08 DIAGNOSIS — N2581 Secondary hyperparathyroidism of renal origin: Secondary | ICD-10-CM | POA: Diagnosis not present

## 2021-06-08 DIAGNOSIS — D689 Coagulation defect, unspecified: Secondary | ICD-10-CM | POA: Diagnosis not present

## 2021-06-08 DIAGNOSIS — Z992 Dependence on renal dialysis: Secondary | ICD-10-CM | POA: Diagnosis not present

## 2021-06-08 DIAGNOSIS — E875 Hyperkalemia: Secondary | ICD-10-CM | POA: Diagnosis not present

## 2021-06-08 DIAGNOSIS — D631 Anemia in chronic kidney disease: Secondary | ICD-10-CM | POA: Diagnosis not present

## 2021-06-08 DIAGNOSIS — N186 End stage renal disease: Secondary | ICD-10-CM | POA: Diagnosis not present

## 2021-06-10 DIAGNOSIS — N2581 Secondary hyperparathyroidism of renal origin: Secondary | ICD-10-CM | POA: Diagnosis not present

## 2021-06-10 DIAGNOSIS — D689 Coagulation defect, unspecified: Secondary | ICD-10-CM | POA: Diagnosis not present

## 2021-06-10 DIAGNOSIS — D631 Anemia in chronic kidney disease: Secondary | ICD-10-CM | POA: Diagnosis not present

## 2021-06-10 DIAGNOSIS — Z992 Dependence on renal dialysis: Secondary | ICD-10-CM | POA: Diagnosis not present

## 2021-06-10 DIAGNOSIS — E875 Hyperkalemia: Secondary | ICD-10-CM | POA: Diagnosis not present

## 2021-06-10 DIAGNOSIS — N186 End stage renal disease: Secondary | ICD-10-CM | POA: Diagnosis not present

## 2021-06-13 DIAGNOSIS — D631 Anemia in chronic kidney disease: Secondary | ICD-10-CM | POA: Diagnosis not present

## 2021-06-13 DIAGNOSIS — E875 Hyperkalemia: Secondary | ICD-10-CM | POA: Diagnosis not present

## 2021-06-13 DIAGNOSIS — D689 Coagulation defect, unspecified: Secondary | ICD-10-CM | POA: Diagnosis not present

## 2021-06-13 DIAGNOSIS — Z992 Dependence on renal dialysis: Secondary | ICD-10-CM | POA: Diagnosis not present

## 2021-06-13 DIAGNOSIS — N2581 Secondary hyperparathyroidism of renal origin: Secondary | ICD-10-CM | POA: Diagnosis not present

## 2021-06-13 DIAGNOSIS — N186 End stage renal disease: Secondary | ICD-10-CM | POA: Diagnosis not present

## 2021-06-15 DIAGNOSIS — Z992 Dependence on renal dialysis: Secondary | ICD-10-CM | POA: Diagnosis not present

## 2021-06-15 DIAGNOSIS — N186 End stage renal disease: Secondary | ICD-10-CM | POA: Diagnosis not present

## 2021-06-15 DIAGNOSIS — N2581 Secondary hyperparathyroidism of renal origin: Secondary | ICD-10-CM | POA: Diagnosis not present

## 2021-06-15 DIAGNOSIS — D689 Coagulation defect, unspecified: Secondary | ICD-10-CM | POA: Diagnosis not present

## 2021-06-15 DIAGNOSIS — D631 Anemia in chronic kidney disease: Secondary | ICD-10-CM | POA: Diagnosis not present

## 2021-06-15 DIAGNOSIS — E875 Hyperkalemia: Secondary | ICD-10-CM | POA: Diagnosis not present

## 2021-06-17 DIAGNOSIS — Z992 Dependence on renal dialysis: Secondary | ICD-10-CM | POA: Diagnosis not present

## 2021-06-17 DIAGNOSIS — D631 Anemia in chronic kidney disease: Secondary | ICD-10-CM | POA: Diagnosis not present

## 2021-06-17 DIAGNOSIS — D689 Coagulation defect, unspecified: Secondary | ICD-10-CM | POA: Diagnosis not present

## 2021-06-17 DIAGNOSIS — N2581 Secondary hyperparathyroidism of renal origin: Secondary | ICD-10-CM | POA: Diagnosis not present

## 2021-06-17 DIAGNOSIS — N186 End stage renal disease: Secondary | ICD-10-CM | POA: Diagnosis not present

## 2021-06-17 DIAGNOSIS — E875 Hyperkalemia: Secondary | ICD-10-CM | POA: Diagnosis not present

## 2021-06-20 DIAGNOSIS — D689 Coagulation defect, unspecified: Secondary | ICD-10-CM | POA: Diagnosis not present

## 2021-06-20 DIAGNOSIS — D631 Anemia in chronic kidney disease: Secondary | ICD-10-CM | POA: Diagnosis not present

## 2021-06-20 DIAGNOSIS — N186 End stage renal disease: Secondary | ICD-10-CM | POA: Diagnosis not present

## 2021-06-20 DIAGNOSIS — E875 Hyperkalemia: Secondary | ICD-10-CM | POA: Diagnosis not present

## 2021-06-20 DIAGNOSIS — N2581 Secondary hyperparathyroidism of renal origin: Secondary | ICD-10-CM | POA: Diagnosis not present

## 2021-06-20 DIAGNOSIS — Z992 Dependence on renal dialysis: Secondary | ICD-10-CM | POA: Diagnosis not present

## 2021-06-22 DIAGNOSIS — D689 Coagulation defect, unspecified: Secondary | ICD-10-CM | POA: Diagnosis not present

## 2021-06-22 DIAGNOSIS — N2581 Secondary hyperparathyroidism of renal origin: Secondary | ICD-10-CM | POA: Diagnosis not present

## 2021-06-22 DIAGNOSIS — N186 End stage renal disease: Secondary | ICD-10-CM | POA: Diagnosis not present

## 2021-06-22 DIAGNOSIS — Z992 Dependence on renal dialysis: Secondary | ICD-10-CM | POA: Diagnosis not present

## 2021-06-22 DIAGNOSIS — D631 Anemia in chronic kidney disease: Secondary | ICD-10-CM | POA: Diagnosis not present

## 2021-06-22 DIAGNOSIS — E875 Hyperkalemia: Secondary | ICD-10-CM | POA: Diagnosis not present

## 2021-06-23 DIAGNOSIS — Z992 Dependence on renal dialysis: Secondary | ICD-10-CM | POA: Diagnosis not present

## 2021-06-23 DIAGNOSIS — N186 End stage renal disease: Secondary | ICD-10-CM | POA: Diagnosis not present

## 2021-06-24 DIAGNOSIS — D689 Coagulation defect, unspecified: Secondary | ICD-10-CM | POA: Diagnosis not present

## 2021-06-24 DIAGNOSIS — N186 End stage renal disease: Secondary | ICD-10-CM | POA: Diagnosis not present

## 2021-06-24 DIAGNOSIS — Z992 Dependence on renal dialysis: Secondary | ICD-10-CM | POA: Diagnosis not present

## 2021-06-24 DIAGNOSIS — N2581 Secondary hyperparathyroidism of renal origin: Secondary | ICD-10-CM | POA: Diagnosis not present

## 2021-06-24 DIAGNOSIS — G4733 Obstructive sleep apnea (adult) (pediatric): Secondary | ICD-10-CM | POA: Diagnosis not present

## 2021-06-24 DIAGNOSIS — Z23 Encounter for immunization: Secondary | ICD-10-CM | POA: Diagnosis not present

## 2021-06-24 DIAGNOSIS — D631 Anemia in chronic kidney disease: Secondary | ICD-10-CM | POA: Diagnosis not present

## 2021-06-24 DIAGNOSIS — E875 Hyperkalemia: Secondary | ICD-10-CM | POA: Diagnosis not present

## 2021-06-27 DIAGNOSIS — Z23 Encounter for immunization: Secondary | ICD-10-CM | POA: Diagnosis not present

## 2021-06-27 DIAGNOSIS — D689 Coagulation defect, unspecified: Secondary | ICD-10-CM | POA: Diagnosis not present

## 2021-06-27 DIAGNOSIS — Z992 Dependence on renal dialysis: Secondary | ICD-10-CM | POA: Diagnosis not present

## 2021-06-27 DIAGNOSIS — D631 Anemia in chronic kidney disease: Secondary | ICD-10-CM | POA: Diagnosis not present

## 2021-06-27 DIAGNOSIS — E875 Hyperkalemia: Secondary | ICD-10-CM | POA: Diagnosis not present

## 2021-06-27 DIAGNOSIS — N2581 Secondary hyperparathyroidism of renal origin: Secondary | ICD-10-CM | POA: Diagnosis not present

## 2021-06-27 DIAGNOSIS — N186 End stage renal disease: Secondary | ICD-10-CM | POA: Diagnosis not present

## 2021-06-29 DIAGNOSIS — G4733 Obstructive sleep apnea (adult) (pediatric): Secondary | ICD-10-CM | POA: Diagnosis not present

## 2021-06-29 DIAGNOSIS — D689 Coagulation defect, unspecified: Secondary | ICD-10-CM | POA: Diagnosis not present

## 2021-06-29 DIAGNOSIS — E875 Hyperkalemia: Secondary | ICD-10-CM | POA: Diagnosis not present

## 2021-06-29 DIAGNOSIS — Z23 Encounter for immunization: Secondary | ICD-10-CM | POA: Diagnosis not present

## 2021-06-29 DIAGNOSIS — Z992 Dependence on renal dialysis: Secondary | ICD-10-CM | POA: Diagnosis not present

## 2021-06-29 DIAGNOSIS — N2581 Secondary hyperparathyroidism of renal origin: Secondary | ICD-10-CM | POA: Diagnosis not present

## 2021-06-29 DIAGNOSIS — N186 End stage renal disease: Secondary | ICD-10-CM | POA: Diagnosis not present

## 2021-06-29 DIAGNOSIS — D631 Anemia in chronic kidney disease: Secondary | ICD-10-CM | POA: Diagnosis not present

## 2021-07-01 DIAGNOSIS — N186 End stage renal disease: Secondary | ICD-10-CM | POA: Diagnosis not present

## 2021-07-01 DIAGNOSIS — N2581 Secondary hyperparathyroidism of renal origin: Secondary | ICD-10-CM | POA: Diagnosis not present

## 2021-07-01 DIAGNOSIS — D689 Coagulation defect, unspecified: Secondary | ICD-10-CM | POA: Diagnosis not present

## 2021-07-01 DIAGNOSIS — E875 Hyperkalemia: Secondary | ICD-10-CM | POA: Diagnosis not present

## 2021-07-01 DIAGNOSIS — Z992 Dependence on renal dialysis: Secondary | ICD-10-CM | POA: Diagnosis not present

## 2021-07-01 DIAGNOSIS — Z23 Encounter for immunization: Secondary | ICD-10-CM | POA: Diagnosis not present

## 2021-07-01 DIAGNOSIS — D631 Anemia in chronic kidney disease: Secondary | ICD-10-CM | POA: Diagnosis not present

## 2021-07-04 DIAGNOSIS — Z992 Dependence on renal dialysis: Secondary | ICD-10-CM | POA: Diagnosis not present

## 2021-07-04 DIAGNOSIS — N186 End stage renal disease: Secondary | ICD-10-CM | POA: Diagnosis not present

## 2021-07-04 DIAGNOSIS — D689 Coagulation defect, unspecified: Secondary | ICD-10-CM | POA: Diagnosis not present

## 2021-07-04 DIAGNOSIS — G4733 Obstructive sleep apnea (adult) (pediatric): Secondary | ICD-10-CM | POA: Diagnosis not present

## 2021-07-04 DIAGNOSIS — E875 Hyperkalemia: Secondary | ICD-10-CM | POA: Diagnosis not present

## 2021-07-04 DIAGNOSIS — D631 Anemia in chronic kidney disease: Secondary | ICD-10-CM | POA: Diagnosis not present

## 2021-07-04 DIAGNOSIS — N2581 Secondary hyperparathyroidism of renal origin: Secondary | ICD-10-CM | POA: Diagnosis not present

## 2021-07-04 DIAGNOSIS — Z23 Encounter for immunization: Secondary | ICD-10-CM | POA: Diagnosis not present

## 2021-07-06 DIAGNOSIS — E875 Hyperkalemia: Secondary | ICD-10-CM | POA: Diagnosis not present

## 2021-07-06 DIAGNOSIS — Z992 Dependence on renal dialysis: Secondary | ICD-10-CM | POA: Diagnosis not present

## 2021-07-06 DIAGNOSIS — N186 End stage renal disease: Secondary | ICD-10-CM | POA: Diagnosis not present

## 2021-07-06 DIAGNOSIS — Z23 Encounter for immunization: Secondary | ICD-10-CM | POA: Diagnosis not present

## 2021-07-06 DIAGNOSIS — N2581 Secondary hyperparathyroidism of renal origin: Secondary | ICD-10-CM | POA: Diagnosis not present

## 2021-07-06 DIAGNOSIS — D631 Anemia in chronic kidney disease: Secondary | ICD-10-CM | POA: Diagnosis not present

## 2021-07-06 DIAGNOSIS — D689 Coagulation defect, unspecified: Secondary | ICD-10-CM | POA: Diagnosis not present

## 2021-07-08 DIAGNOSIS — Z992 Dependence on renal dialysis: Secondary | ICD-10-CM | POA: Diagnosis not present

## 2021-07-08 DIAGNOSIS — D689 Coagulation defect, unspecified: Secondary | ICD-10-CM | POA: Diagnosis not present

## 2021-07-08 DIAGNOSIS — D631 Anemia in chronic kidney disease: Secondary | ICD-10-CM | POA: Diagnosis not present

## 2021-07-08 DIAGNOSIS — E875 Hyperkalemia: Secondary | ICD-10-CM | POA: Diagnosis not present

## 2021-07-08 DIAGNOSIS — Z23 Encounter for immunization: Secondary | ICD-10-CM | POA: Diagnosis not present

## 2021-07-08 DIAGNOSIS — N2581 Secondary hyperparathyroidism of renal origin: Secondary | ICD-10-CM | POA: Diagnosis not present

## 2021-07-08 DIAGNOSIS — N186 End stage renal disease: Secondary | ICD-10-CM | POA: Diagnosis not present

## 2021-07-11 DIAGNOSIS — N2581 Secondary hyperparathyroidism of renal origin: Secondary | ICD-10-CM | POA: Diagnosis not present

## 2021-07-11 DIAGNOSIS — Z23 Encounter for immunization: Secondary | ICD-10-CM | POA: Diagnosis not present

## 2021-07-11 DIAGNOSIS — D631 Anemia in chronic kidney disease: Secondary | ICD-10-CM | POA: Diagnosis not present

## 2021-07-11 DIAGNOSIS — N186 End stage renal disease: Secondary | ICD-10-CM | POA: Diagnosis not present

## 2021-07-11 DIAGNOSIS — Z992 Dependence on renal dialysis: Secondary | ICD-10-CM | POA: Diagnosis not present

## 2021-07-11 DIAGNOSIS — E875 Hyperkalemia: Secondary | ICD-10-CM | POA: Diagnosis not present

## 2021-07-11 DIAGNOSIS — D689 Coagulation defect, unspecified: Secondary | ICD-10-CM | POA: Diagnosis not present

## 2021-07-13 DIAGNOSIS — Z992 Dependence on renal dialysis: Secondary | ICD-10-CM | POA: Diagnosis not present

## 2021-07-13 DIAGNOSIS — E875 Hyperkalemia: Secondary | ICD-10-CM | POA: Diagnosis not present

## 2021-07-13 DIAGNOSIS — N186 End stage renal disease: Secondary | ICD-10-CM | POA: Diagnosis not present

## 2021-07-13 DIAGNOSIS — D631 Anemia in chronic kidney disease: Secondary | ICD-10-CM | POA: Diagnosis not present

## 2021-07-13 DIAGNOSIS — N2581 Secondary hyperparathyroidism of renal origin: Secondary | ICD-10-CM | POA: Diagnosis not present

## 2021-07-13 DIAGNOSIS — Z23 Encounter for immunization: Secondary | ICD-10-CM | POA: Diagnosis not present

## 2021-07-13 DIAGNOSIS — D689 Coagulation defect, unspecified: Secondary | ICD-10-CM | POA: Diagnosis not present

## 2021-07-14 DIAGNOSIS — D689 Coagulation defect, unspecified: Secondary | ICD-10-CM | POA: Diagnosis not present

## 2021-07-14 DIAGNOSIS — D631 Anemia in chronic kidney disease: Secondary | ICD-10-CM | POA: Diagnosis not present

## 2021-07-14 DIAGNOSIS — N186 End stage renal disease: Secondary | ICD-10-CM | POA: Diagnosis not present

## 2021-07-14 DIAGNOSIS — E875 Hyperkalemia: Secondary | ICD-10-CM | POA: Diagnosis not present

## 2021-07-14 DIAGNOSIS — Z992 Dependence on renal dialysis: Secondary | ICD-10-CM | POA: Diagnosis not present

## 2021-07-14 DIAGNOSIS — N2581 Secondary hyperparathyroidism of renal origin: Secondary | ICD-10-CM | POA: Diagnosis not present

## 2021-07-14 DIAGNOSIS — Z23 Encounter for immunization: Secondary | ICD-10-CM | POA: Diagnosis not present

## 2021-07-15 DIAGNOSIS — Z992 Dependence on renal dialysis: Secondary | ICD-10-CM | POA: Diagnosis not present

## 2021-07-15 DIAGNOSIS — D689 Coagulation defect, unspecified: Secondary | ICD-10-CM | POA: Diagnosis not present

## 2021-07-15 DIAGNOSIS — D631 Anemia in chronic kidney disease: Secondary | ICD-10-CM | POA: Diagnosis not present

## 2021-07-15 DIAGNOSIS — N186 End stage renal disease: Secondary | ICD-10-CM | POA: Diagnosis not present

## 2021-07-15 DIAGNOSIS — Z23 Encounter for immunization: Secondary | ICD-10-CM | POA: Diagnosis not present

## 2021-07-15 DIAGNOSIS — E875 Hyperkalemia: Secondary | ICD-10-CM | POA: Diagnosis not present

## 2021-07-15 DIAGNOSIS — N2581 Secondary hyperparathyroidism of renal origin: Secondary | ICD-10-CM | POA: Diagnosis not present

## 2021-07-18 DIAGNOSIS — D631 Anemia in chronic kidney disease: Secondary | ICD-10-CM | POA: Diagnosis not present

## 2021-07-18 DIAGNOSIS — Z992 Dependence on renal dialysis: Secondary | ICD-10-CM | POA: Diagnosis not present

## 2021-07-18 DIAGNOSIS — I10 Essential (primary) hypertension: Secondary | ICD-10-CM | POA: Diagnosis not present

## 2021-07-18 DIAGNOSIS — Z23 Encounter for immunization: Secondary | ICD-10-CM | POA: Diagnosis not present

## 2021-07-18 DIAGNOSIS — N2581 Secondary hyperparathyroidism of renal origin: Secondary | ICD-10-CM | POA: Diagnosis not present

## 2021-07-18 DIAGNOSIS — N186 End stage renal disease: Secondary | ICD-10-CM | POA: Diagnosis not present

## 2021-07-18 DIAGNOSIS — I129 Hypertensive chronic kidney disease with stage 1 through stage 4 chronic kidney disease, or unspecified chronic kidney disease: Secondary | ICD-10-CM | POA: Diagnosis not present

## 2021-07-18 DIAGNOSIS — D689 Coagulation defect, unspecified: Secondary | ICD-10-CM | POA: Diagnosis not present

## 2021-07-18 DIAGNOSIS — E875 Hyperkalemia: Secondary | ICD-10-CM | POA: Diagnosis not present

## 2021-07-20 DIAGNOSIS — N186 End stage renal disease: Secondary | ICD-10-CM | POA: Diagnosis not present

## 2021-07-20 DIAGNOSIS — I129 Hypertensive chronic kidney disease with stage 1 through stage 4 chronic kidney disease, or unspecified chronic kidney disease: Secondary | ICD-10-CM | POA: Diagnosis not present

## 2021-07-20 DIAGNOSIS — D631 Anemia in chronic kidney disease: Secondary | ICD-10-CM | POA: Diagnosis not present

## 2021-07-20 DIAGNOSIS — Z992 Dependence on renal dialysis: Secondary | ICD-10-CM | POA: Diagnosis not present

## 2021-07-20 DIAGNOSIS — I10 Essential (primary) hypertension: Secondary | ICD-10-CM | POA: Diagnosis not present

## 2021-07-22 DIAGNOSIS — Z992 Dependence on renal dialysis: Secondary | ICD-10-CM | POA: Diagnosis not present

## 2021-07-22 DIAGNOSIS — N186 End stage renal disease: Secondary | ICD-10-CM | POA: Diagnosis not present

## 2021-07-22 DIAGNOSIS — D631 Anemia in chronic kidney disease: Secondary | ICD-10-CM | POA: Diagnosis not present

## 2021-07-22 DIAGNOSIS — I129 Hypertensive chronic kidney disease with stage 1 through stage 4 chronic kidney disease, or unspecified chronic kidney disease: Secondary | ICD-10-CM | POA: Diagnosis not present

## 2021-07-22 DIAGNOSIS — I10 Essential (primary) hypertension: Secondary | ICD-10-CM | POA: Diagnosis not present

## 2021-07-23 DIAGNOSIS — N186 End stage renal disease: Secondary | ICD-10-CM | POA: Diagnosis not present

## 2021-07-25 DIAGNOSIS — I129 Hypertensive chronic kidney disease with stage 1 through stage 4 chronic kidney disease, or unspecified chronic kidney disease: Secondary | ICD-10-CM | POA: Diagnosis not present

## 2021-07-25 DIAGNOSIS — Z992 Dependence on renal dialysis: Secondary | ICD-10-CM | POA: Diagnosis not present

## 2021-07-25 DIAGNOSIS — D631 Anemia in chronic kidney disease: Secondary | ICD-10-CM | POA: Diagnosis not present

## 2021-07-25 DIAGNOSIS — I10 Essential (primary) hypertension: Secondary | ICD-10-CM | POA: Diagnosis not present

## 2021-07-25 DIAGNOSIS — G4733 Obstructive sleep apnea (adult) (pediatric): Secondary | ICD-10-CM | POA: Diagnosis not present

## 2021-07-25 DIAGNOSIS — N186 End stage renal disease: Secondary | ICD-10-CM | POA: Diagnosis not present

## 2021-07-27 DIAGNOSIS — I10 Essential (primary) hypertension: Secondary | ICD-10-CM | POA: Diagnosis not present

## 2021-07-27 DIAGNOSIS — Z992 Dependence on renal dialysis: Secondary | ICD-10-CM | POA: Diagnosis not present

## 2021-07-27 DIAGNOSIS — N186 End stage renal disease: Secondary | ICD-10-CM | POA: Diagnosis not present

## 2021-07-27 DIAGNOSIS — I129 Hypertensive chronic kidney disease with stage 1 through stage 4 chronic kidney disease, or unspecified chronic kidney disease: Secondary | ICD-10-CM | POA: Diagnosis not present

## 2021-07-27 DIAGNOSIS — D631 Anemia in chronic kidney disease: Secondary | ICD-10-CM | POA: Diagnosis not present

## 2021-07-29 DIAGNOSIS — D631 Anemia in chronic kidney disease: Secondary | ICD-10-CM | POA: Diagnosis not present

## 2021-07-29 DIAGNOSIS — N186 End stage renal disease: Secondary | ICD-10-CM | POA: Diagnosis not present

## 2021-07-29 DIAGNOSIS — Z992 Dependence on renal dialysis: Secondary | ICD-10-CM | POA: Diagnosis not present

## 2021-07-29 DIAGNOSIS — I129 Hypertensive chronic kidney disease with stage 1 through stage 4 chronic kidney disease, or unspecified chronic kidney disease: Secondary | ICD-10-CM | POA: Diagnosis not present

## 2021-07-29 DIAGNOSIS — I10 Essential (primary) hypertension: Secondary | ICD-10-CM | POA: Diagnosis not present

## 2021-08-01 DIAGNOSIS — E875 Hyperkalemia: Secondary | ICD-10-CM | POA: Diagnosis not present

## 2021-08-01 DIAGNOSIS — N2581 Secondary hyperparathyroidism of renal origin: Secondary | ICD-10-CM | POA: Diagnosis not present

## 2021-08-01 DIAGNOSIS — Z992 Dependence on renal dialysis: Secondary | ICD-10-CM | POA: Diagnosis not present

## 2021-08-01 DIAGNOSIS — N186 End stage renal disease: Secondary | ICD-10-CM | POA: Diagnosis not present

## 2021-08-01 DIAGNOSIS — D631 Anemia in chronic kidney disease: Secondary | ICD-10-CM | POA: Diagnosis not present

## 2021-08-01 DIAGNOSIS — D689 Coagulation defect, unspecified: Secondary | ICD-10-CM | POA: Diagnosis not present

## 2021-08-02 DIAGNOSIS — G4733 Obstructive sleep apnea (adult) (pediatric): Secondary | ICD-10-CM | POA: Diagnosis not present

## 2021-08-03 DIAGNOSIS — N2581 Secondary hyperparathyroidism of renal origin: Secondary | ICD-10-CM | POA: Diagnosis not present

## 2021-08-03 DIAGNOSIS — D689 Coagulation defect, unspecified: Secondary | ICD-10-CM | POA: Diagnosis not present

## 2021-08-03 DIAGNOSIS — N186 End stage renal disease: Secondary | ICD-10-CM | POA: Diagnosis not present

## 2021-08-03 DIAGNOSIS — Z992 Dependence on renal dialysis: Secondary | ICD-10-CM | POA: Diagnosis not present

## 2021-08-03 DIAGNOSIS — D631 Anemia in chronic kidney disease: Secondary | ICD-10-CM | POA: Diagnosis not present

## 2021-08-03 DIAGNOSIS — E875 Hyperkalemia: Secondary | ICD-10-CM | POA: Diagnosis not present

## 2021-08-04 DIAGNOSIS — N186 End stage renal disease: Secondary | ICD-10-CM | POA: Diagnosis not present

## 2021-08-04 DIAGNOSIS — D689 Coagulation defect, unspecified: Secondary | ICD-10-CM | POA: Diagnosis not present

## 2021-08-04 DIAGNOSIS — N2581 Secondary hyperparathyroidism of renal origin: Secondary | ICD-10-CM | POA: Diagnosis not present

## 2021-08-04 DIAGNOSIS — D631 Anemia in chronic kidney disease: Secondary | ICD-10-CM | POA: Diagnosis not present

## 2021-08-04 DIAGNOSIS — E875 Hyperkalemia: Secondary | ICD-10-CM | POA: Diagnosis not present

## 2021-08-04 DIAGNOSIS — Z992 Dependence on renal dialysis: Secondary | ICD-10-CM | POA: Diagnosis not present

## 2021-08-05 DIAGNOSIS — Z23 Encounter for immunization: Secondary | ICD-10-CM | POA: Diagnosis not present

## 2021-08-07 DIAGNOSIS — N2581 Secondary hyperparathyroidism of renal origin: Secondary | ICD-10-CM | POA: Diagnosis not present

## 2021-08-07 DIAGNOSIS — D689 Coagulation defect, unspecified: Secondary | ICD-10-CM | POA: Diagnosis not present

## 2021-08-07 DIAGNOSIS — D631 Anemia in chronic kidney disease: Secondary | ICD-10-CM | POA: Diagnosis not present

## 2021-08-07 DIAGNOSIS — Z992 Dependence on renal dialysis: Secondary | ICD-10-CM | POA: Diagnosis not present

## 2021-08-07 DIAGNOSIS — E875 Hyperkalemia: Secondary | ICD-10-CM | POA: Diagnosis not present

## 2021-08-07 DIAGNOSIS — N186 End stage renal disease: Secondary | ICD-10-CM | POA: Diagnosis not present

## 2021-08-08 DIAGNOSIS — D689 Coagulation defect, unspecified: Secondary | ICD-10-CM | POA: Diagnosis not present

## 2021-08-08 DIAGNOSIS — N186 End stage renal disease: Secondary | ICD-10-CM | POA: Diagnosis not present

## 2021-08-08 DIAGNOSIS — N2581 Secondary hyperparathyroidism of renal origin: Secondary | ICD-10-CM | POA: Diagnosis not present

## 2021-08-08 DIAGNOSIS — D631 Anemia in chronic kidney disease: Secondary | ICD-10-CM | POA: Diagnosis not present

## 2021-08-08 DIAGNOSIS — Z992 Dependence on renal dialysis: Secondary | ICD-10-CM | POA: Diagnosis not present

## 2021-08-08 DIAGNOSIS — E875 Hyperkalemia: Secondary | ICD-10-CM | POA: Diagnosis not present

## 2021-08-10 DIAGNOSIS — E875 Hyperkalemia: Secondary | ICD-10-CM | POA: Diagnosis not present

## 2021-08-10 DIAGNOSIS — N2581 Secondary hyperparathyroidism of renal origin: Secondary | ICD-10-CM | POA: Diagnosis not present

## 2021-08-10 DIAGNOSIS — N186 End stage renal disease: Secondary | ICD-10-CM | POA: Diagnosis not present

## 2021-08-10 DIAGNOSIS — D689 Coagulation defect, unspecified: Secondary | ICD-10-CM | POA: Diagnosis not present

## 2021-08-10 DIAGNOSIS — Z992 Dependence on renal dialysis: Secondary | ICD-10-CM | POA: Diagnosis not present

## 2021-08-10 DIAGNOSIS — D631 Anemia in chronic kidney disease: Secondary | ICD-10-CM | POA: Diagnosis not present

## 2021-08-12 DIAGNOSIS — N2581 Secondary hyperparathyroidism of renal origin: Secondary | ICD-10-CM | POA: Diagnosis not present

## 2021-08-12 DIAGNOSIS — D689 Coagulation defect, unspecified: Secondary | ICD-10-CM | POA: Diagnosis not present

## 2021-08-12 DIAGNOSIS — D631 Anemia in chronic kidney disease: Secondary | ICD-10-CM | POA: Diagnosis not present

## 2021-08-12 DIAGNOSIS — E875 Hyperkalemia: Secondary | ICD-10-CM | POA: Diagnosis not present

## 2021-08-12 DIAGNOSIS — N186 End stage renal disease: Secondary | ICD-10-CM | POA: Diagnosis not present

## 2021-08-12 DIAGNOSIS — Z992 Dependence on renal dialysis: Secondary | ICD-10-CM | POA: Diagnosis not present

## 2021-08-14 DIAGNOSIS — N186 End stage renal disease: Secondary | ICD-10-CM | POA: Diagnosis not present

## 2021-08-14 DIAGNOSIS — D631 Anemia in chronic kidney disease: Secondary | ICD-10-CM | POA: Diagnosis not present

## 2021-08-14 DIAGNOSIS — Z992 Dependence on renal dialysis: Secondary | ICD-10-CM | POA: Diagnosis not present

## 2021-08-14 DIAGNOSIS — N2581 Secondary hyperparathyroidism of renal origin: Secondary | ICD-10-CM | POA: Diagnosis not present

## 2021-08-14 DIAGNOSIS — E875 Hyperkalemia: Secondary | ICD-10-CM | POA: Diagnosis not present

## 2021-08-14 DIAGNOSIS — D689 Coagulation defect, unspecified: Secondary | ICD-10-CM | POA: Diagnosis not present

## 2021-08-17 DIAGNOSIS — N186 End stage renal disease: Secondary | ICD-10-CM | POA: Diagnosis not present

## 2021-08-17 DIAGNOSIS — Z992 Dependence on renal dialysis: Secondary | ICD-10-CM | POA: Diagnosis not present

## 2021-08-19 DIAGNOSIS — Z992 Dependence on renal dialysis: Secondary | ICD-10-CM | POA: Diagnosis not present

## 2021-08-19 DIAGNOSIS — N186 End stage renal disease: Secondary | ICD-10-CM | POA: Diagnosis not present

## 2021-08-22 DIAGNOSIS — Z992 Dependence on renal dialysis: Secondary | ICD-10-CM | POA: Diagnosis not present

## 2021-08-22 DIAGNOSIS — N186 End stage renal disease: Secondary | ICD-10-CM | POA: Diagnosis not present

## 2021-08-22 DIAGNOSIS — N2581 Secondary hyperparathyroidism of renal origin: Secondary | ICD-10-CM | POA: Diagnosis not present

## 2021-08-22 DIAGNOSIS — D689 Coagulation defect, unspecified: Secondary | ICD-10-CM | POA: Diagnosis not present

## 2021-08-22 DIAGNOSIS — I2510 Atherosclerotic heart disease of native coronary artery without angina pectoris: Secondary | ICD-10-CM | POA: Diagnosis not present

## 2021-08-22 DIAGNOSIS — E785 Hyperlipidemia, unspecified: Secondary | ICD-10-CM | POA: Diagnosis not present

## 2021-08-22 DIAGNOSIS — E875 Hyperkalemia: Secondary | ICD-10-CM | POA: Diagnosis not present

## 2021-08-22 DIAGNOSIS — D631 Anemia in chronic kidney disease: Secondary | ICD-10-CM | POA: Diagnosis not present

## 2021-08-22 DIAGNOSIS — I120 Hypertensive chronic kidney disease with stage 5 chronic kidney disease or end stage renal disease: Secondary | ICD-10-CM | POA: Diagnosis not present

## 2021-08-22 DIAGNOSIS — Z955 Presence of coronary angioplasty implant and graft: Secondary | ICD-10-CM | POA: Diagnosis not present

## 2021-08-23 DIAGNOSIS — N186 End stage renal disease: Secondary | ICD-10-CM | POA: Diagnosis not present

## 2021-08-23 DIAGNOSIS — Z992 Dependence on renal dialysis: Secondary | ICD-10-CM | POA: Diagnosis not present

## 2021-08-24 DIAGNOSIS — D689 Coagulation defect, unspecified: Secondary | ICD-10-CM | POA: Diagnosis not present

## 2021-08-24 DIAGNOSIS — G4733 Obstructive sleep apnea (adult) (pediatric): Secondary | ICD-10-CM | POA: Diagnosis not present

## 2021-08-24 DIAGNOSIS — N2581 Secondary hyperparathyroidism of renal origin: Secondary | ICD-10-CM | POA: Diagnosis not present

## 2021-08-24 DIAGNOSIS — D631 Anemia in chronic kidney disease: Secondary | ICD-10-CM | POA: Diagnosis not present

## 2021-08-24 DIAGNOSIS — E875 Hyperkalemia: Secondary | ICD-10-CM | POA: Diagnosis not present

## 2021-08-24 DIAGNOSIS — Z992 Dependence on renal dialysis: Secondary | ICD-10-CM | POA: Diagnosis not present

## 2021-08-24 DIAGNOSIS — N186 End stage renal disease: Secondary | ICD-10-CM | POA: Diagnosis not present

## 2021-08-26 DIAGNOSIS — N2581 Secondary hyperparathyroidism of renal origin: Secondary | ICD-10-CM | POA: Diagnosis not present

## 2021-08-26 DIAGNOSIS — Z992 Dependence on renal dialysis: Secondary | ICD-10-CM | POA: Diagnosis not present

## 2021-08-26 DIAGNOSIS — D631 Anemia in chronic kidney disease: Secondary | ICD-10-CM | POA: Diagnosis not present

## 2021-08-26 DIAGNOSIS — E875 Hyperkalemia: Secondary | ICD-10-CM | POA: Diagnosis not present

## 2021-08-26 DIAGNOSIS — N186 End stage renal disease: Secondary | ICD-10-CM | POA: Diagnosis not present

## 2021-08-26 DIAGNOSIS — D689 Coagulation defect, unspecified: Secondary | ICD-10-CM | POA: Diagnosis not present

## 2021-08-27 DIAGNOSIS — R6883 Chills (without fever): Secondary | ICD-10-CM | POA: Diagnosis not present

## 2021-08-27 DIAGNOSIS — Z03818 Encounter for observation for suspected exposure to other biological agents ruled out: Secondary | ICD-10-CM | POA: Diagnosis not present

## 2021-08-27 DIAGNOSIS — R0981 Nasal congestion: Secondary | ICD-10-CM | POA: Diagnosis not present

## 2021-08-27 DIAGNOSIS — R109 Unspecified abdominal pain: Secondary | ICD-10-CM | POA: Diagnosis not present

## 2021-08-27 DIAGNOSIS — R112 Nausea with vomiting, unspecified: Secondary | ICD-10-CM | POA: Diagnosis not present

## 2021-08-29 DIAGNOSIS — Z992 Dependence on renal dialysis: Secondary | ICD-10-CM | POA: Diagnosis not present

## 2021-08-29 DIAGNOSIS — D631 Anemia in chronic kidney disease: Secondary | ICD-10-CM | POA: Diagnosis not present

## 2021-08-29 DIAGNOSIS — N2581 Secondary hyperparathyroidism of renal origin: Secondary | ICD-10-CM | POA: Diagnosis not present

## 2021-08-29 DIAGNOSIS — E875 Hyperkalemia: Secondary | ICD-10-CM | POA: Diagnosis not present

## 2021-08-29 DIAGNOSIS — N186 End stage renal disease: Secondary | ICD-10-CM | POA: Diagnosis not present

## 2021-08-29 DIAGNOSIS — D689 Coagulation defect, unspecified: Secondary | ICD-10-CM | POA: Diagnosis not present

## 2021-08-30 DIAGNOSIS — N2581 Secondary hyperparathyroidism of renal origin: Secondary | ICD-10-CM | POA: Diagnosis not present

## 2021-08-30 DIAGNOSIS — N186 End stage renal disease: Secondary | ICD-10-CM | POA: Diagnosis not present

## 2021-08-30 DIAGNOSIS — D631 Anemia in chronic kidney disease: Secondary | ICD-10-CM | POA: Diagnosis not present

## 2021-08-30 DIAGNOSIS — E875 Hyperkalemia: Secondary | ICD-10-CM | POA: Diagnosis not present

## 2021-08-30 DIAGNOSIS — Z992 Dependence on renal dialysis: Secondary | ICD-10-CM | POA: Diagnosis not present

## 2021-08-30 DIAGNOSIS — D689 Coagulation defect, unspecified: Secondary | ICD-10-CM | POA: Diagnosis not present

## 2021-08-31 DIAGNOSIS — E875 Hyperkalemia: Secondary | ICD-10-CM | POA: Diagnosis not present

## 2021-08-31 DIAGNOSIS — Z992 Dependence on renal dialysis: Secondary | ICD-10-CM | POA: Diagnosis not present

## 2021-08-31 DIAGNOSIS — N186 End stage renal disease: Secondary | ICD-10-CM | POA: Diagnosis not present

## 2021-08-31 DIAGNOSIS — N2581 Secondary hyperparathyroidism of renal origin: Secondary | ICD-10-CM | POA: Diagnosis not present

## 2021-08-31 DIAGNOSIS — D689 Coagulation defect, unspecified: Secondary | ICD-10-CM | POA: Diagnosis not present

## 2021-08-31 DIAGNOSIS — D631 Anemia in chronic kidney disease: Secondary | ICD-10-CM | POA: Diagnosis not present

## 2021-09-02 DIAGNOSIS — E875 Hyperkalemia: Secondary | ICD-10-CM | POA: Diagnosis not present

## 2021-09-02 DIAGNOSIS — N2581 Secondary hyperparathyroidism of renal origin: Secondary | ICD-10-CM | POA: Diagnosis not present

## 2021-09-02 DIAGNOSIS — Z992 Dependence on renal dialysis: Secondary | ICD-10-CM | POA: Diagnosis not present

## 2021-09-02 DIAGNOSIS — D689 Coagulation defect, unspecified: Secondary | ICD-10-CM | POA: Diagnosis not present

## 2021-09-02 DIAGNOSIS — D631 Anemia in chronic kidney disease: Secondary | ICD-10-CM | POA: Diagnosis not present

## 2021-09-02 DIAGNOSIS — N186 End stage renal disease: Secondary | ICD-10-CM | POA: Diagnosis not present

## 2021-09-05 DIAGNOSIS — D631 Anemia in chronic kidney disease: Secondary | ICD-10-CM | POA: Diagnosis not present

## 2021-09-05 DIAGNOSIS — N186 End stage renal disease: Secondary | ICD-10-CM | POA: Diagnosis not present

## 2021-09-05 DIAGNOSIS — Z992 Dependence on renal dialysis: Secondary | ICD-10-CM | POA: Diagnosis not present

## 2021-09-05 DIAGNOSIS — N2581 Secondary hyperparathyroidism of renal origin: Secondary | ICD-10-CM | POA: Diagnosis not present

## 2021-09-05 DIAGNOSIS — D689 Coagulation defect, unspecified: Secondary | ICD-10-CM | POA: Diagnosis not present

## 2021-09-05 DIAGNOSIS — E875 Hyperkalemia: Secondary | ICD-10-CM | POA: Diagnosis not present

## 2021-09-06 DIAGNOSIS — G4733 Obstructive sleep apnea (adult) (pediatric): Secondary | ICD-10-CM | POA: Diagnosis not present

## 2021-09-07 DIAGNOSIS — N186 End stage renal disease: Secondary | ICD-10-CM | POA: Diagnosis not present

## 2021-09-07 DIAGNOSIS — D631 Anemia in chronic kidney disease: Secondary | ICD-10-CM | POA: Diagnosis not present

## 2021-09-07 DIAGNOSIS — D689 Coagulation defect, unspecified: Secondary | ICD-10-CM | POA: Diagnosis not present

## 2021-09-07 DIAGNOSIS — N2581 Secondary hyperparathyroidism of renal origin: Secondary | ICD-10-CM | POA: Diagnosis not present

## 2021-09-07 DIAGNOSIS — E875 Hyperkalemia: Secondary | ICD-10-CM | POA: Diagnosis not present

## 2021-09-07 DIAGNOSIS — Z992 Dependence on renal dialysis: Secondary | ICD-10-CM | POA: Diagnosis not present

## 2021-09-09 DIAGNOSIS — N2581 Secondary hyperparathyroidism of renal origin: Secondary | ICD-10-CM | POA: Diagnosis not present

## 2021-09-09 DIAGNOSIS — N186 End stage renal disease: Secondary | ICD-10-CM | POA: Diagnosis not present

## 2021-09-09 DIAGNOSIS — E875 Hyperkalemia: Secondary | ICD-10-CM | POA: Diagnosis not present

## 2021-09-09 DIAGNOSIS — Z992 Dependence on renal dialysis: Secondary | ICD-10-CM | POA: Diagnosis not present

## 2021-09-09 DIAGNOSIS — D631 Anemia in chronic kidney disease: Secondary | ICD-10-CM | POA: Diagnosis not present

## 2021-09-09 DIAGNOSIS — D689 Coagulation defect, unspecified: Secondary | ICD-10-CM | POA: Diagnosis not present

## 2021-09-12 DIAGNOSIS — E875 Hyperkalemia: Secondary | ICD-10-CM | POA: Diagnosis not present

## 2021-09-12 DIAGNOSIS — D631 Anemia in chronic kidney disease: Secondary | ICD-10-CM | POA: Diagnosis not present

## 2021-09-12 DIAGNOSIS — Z992 Dependence on renal dialysis: Secondary | ICD-10-CM | POA: Diagnosis not present

## 2021-09-12 DIAGNOSIS — N186 End stage renal disease: Secondary | ICD-10-CM | POA: Diagnosis not present

## 2021-09-12 DIAGNOSIS — N2581 Secondary hyperparathyroidism of renal origin: Secondary | ICD-10-CM | POA: Diagnosis not present

## 2021-09-12 DIAGNOSIS — D689 Coagulation defect, unspecified: Secondary | ICD-10-CM | POA: Diagnosis not present

## 2021-09-13 DIAGNOSIS — I871 Compression of vein: Secondary | ICD-10-CM | POA: Diagnosis not present

## 2021-09-13 DIAGNOSIS — Z992 Dependence on renal dialysis: Secondary | ICD-10-CM | POA: Diagnosis not present

## 2021-09-13 DIAGNOSIS — T82858A Stenosis of vascular prosthetic devices, implants and grafts, initial encounter: Secondary | ICD-10-CM | POA: Diagnosis not present

## 2021-09-13 DIAGNOSIS — N186 End stage renal disease: Secondary | ICD-10-CM | POA: Diagnosis not present

## 2021-09-14 DIAGNOSIS — D631 Anemia in chronic kidney disease: Secondary | ICD-10-CM | POA: Diagnosis not present

## 2021-09-14 DIAGNOSIS — E875 Hyperkalemia: Secondary | ICD-10-CM | POA: Diagnosis not present

## 2021-09-14 DIAGNOSIS — N2581 Secondary hyperparathyroidism of renal origin: Secondary | ICD-10-CM | POA: Diagnosis not present

## 2021-09-14 DIAGNOSIS — D689 Coagulation defect, unspecified: Secondary | ICD-10-CM | POA: Diagnosis not present

## 2021-09-14 DIAGNOSIS — Z992 Dependence on renal dialysis: Secondary | ICD-10-CM | POA: Diagnosis not present

## 2021-09-14 DIAGNOSIS — N186 End stage renal disease: Secondary | ICD-10-CM | POA: Diagnosis not present

## 2021-09-19 DIAGNOSIS — D631 Anemia in chronic kidney disease: Secondary | ICD-10-CM | POA: Diagnosis not present

## 2021-09-19 DIAGNOSIS — Z992 Dependence on renal dialysis: Secondary | ICD-10-CM | POA: Diagnosis not present

## 2021-09-19 DIAGNOSIS — E875 Hyperkalemia: Secondary | ICD-10-CM | POA: Diagnosis not present

## 2021-09-19 DIAGNOSIS — N2581 Secondary hyperparathyroidism of renal origin: Secondary | ICD-10-CM | POA: Diagnosis not present

## 2021-09-19 DIAGNOSIS — D689 Coagulation defect, unspecified: Secondary | ICD-10-CM | POA: Diagnosis not present

## 2021-09-19 DIAGNOSIS — N186 End stage renal disease: Secondary | ICD-10-CM | POA: Diagnosis not present

## 2021-09-21 DIAGNOSIS — N2581 Secondary hyperparathyroidism of renal origin: Secondary | ICD-10-CM | POA: Diagnosis not present

## 2021-09-21 DIAGNOSIS — E875 Hyperkalemia: Secondary | ICD-10-CM | POA: Diagnosis not present

## 2021-09-21 DIAGNOSIS — Z992 Dependence on renal dialysis: Secondary | ICD-10-CM | POA: Diagnosis not present

## 2021-09-21 DIAGNOSIS — D631 Anemia in chronic kidney disease: Secondary | ICD-10-CM | POA: Diagnosis not present

## 2021-09-21 DIAGNOSIS — N186 End stage renal disease: Secondary | ICD-10-CM | POA: Diagnosis not present

## 2021-09-21 DIAGNOSIS — D689 Coagulation defect, unspecified: Secondary | ICD-10-CM | POA: Diagnosis not present

## 2021-09-23 DIAGNOSIS — D631 Anemia in chronic kidney disease: Secondary | ICD-10-CM | POA: Diagnosis not present

## 2021-09-23 DIAGNOSIS — Z992 Dependence on renal dialysis: Secondary | ICD-10-CM | POA: Diagnosis not present

## 2021-09-23 DIAGNOSIS — E875 Hyperkalemia: Secondary | ICD-10-CM | POA: Diagnosis not present

## 2021-09-23 DIAGNOSIS — N2581 Secondary hyperparathyroidism of renal origin: Secondary | ICD-10-CM | POA: Diagnosis not present

## 2021-09-23 DIAGNOSIS — N186 End stage renal disease: Secondary | ICD-10-CM | POA: Diagnosis not present

## 2021-09-23 DIAGNOSIS — D689 Coagulation defect, unspecified: Secondary | ICD-10-CM | POA: Diagnosis not present

## 2021-09-26 DIAGNOSIS — N2581 Secondary hyperparathyroidism of renal origin: Secondary | ICD-10-CM | POA: Diagnosis not present

## 2021-09-26 DIAGNOSIS — N186 End stage renal disease: Secondary | ICD-10-CM | POA: Diagnosis not present

## 2021-09-26 DIAGNOSIS — Z992 Dependence on renal dialysis: Secondary | ICD-10-CM | POA: Diagnosis not present

## 2021-09-28 DIAGNOSIS — N2581 Secondary hyperparathyroidism of renal origin: Secondary | ICD-10-CM | POA: Diagnosis not present

## 2021-09-28 DIAGNOSIS — Z992 Dependence on renal dialysis: Secondary | ICD-10-CM | POA: Diagnosis not present

## 2021-09-28 DIAGNOSIS — N186 End stage renal disease: Secondary | ICD-10-CM | POA: Diagnosis not present

## 2021-09-29 DIAGNOSIS — Z992 Dependence on renal dialysis: Secondary | ICD-10-CM | POA: Diagnosis not present

## 2021-09-29 DIAGNOSIS — I2510 Atherosclerotic heart disease of native coronary artery without angina pectoris: Secondary | ICD-10-CM | POA: Diagnosis not present

## 2021-09-29 DIAGNOSIS — N186 End stage renal disease: Secondary | ICD-10-CM | POA: Diagnosis not present

## 2021-09-29 DIAGNOSIS — E039 Hypothyroidism, unspecified: Secondary | ICD-10-CM | POA: Diagnosis not present

## 2021-09-29 DIAGNOSIS — G4733 Obstructive sleep apnea (adult) (pediatric): Secondary | ICD-10-CM | POA: Diagnosis not present

## 2021-09-29 DIAGNOSIS — I10 Essential (primary) hypertension: Secondary | ICD-10-CM | POA: Diagnosis not present

## 2021-09-29 DIAGNOSIS — R7303 Prediabetes: Secondary | ICD-10-CM | POA: Diagnosis not present

## 2021-09-29 DIAGNOSIS — E7800 Pure hypercholesterolemia, unspecified: Secondary | ICD-10-CM | POA: Diagnosis not present

## 2021-09-29 DIAGNOSIS — K219 Gastro-esophageal reflux disease without esophagitis: Secondary | ICD-10-CM | POA: Diagnosis not present

## 2021-09-29 DIAGNOSIS — E213 Hyperparathyroidism, unspecified: Secondary | ICD-10-CM | POA: Diagnosis not present

## 2021-09-29 DIAGNOSIS — E790 Hyperuricemia without signs of inflammatory arthritis and tophaceous disease: Secondary | ICD-10-CM | POA: Diagnosis not present

## 2021-09-30 DIAGNOSIS — N2581 Secondary hyperparathyroidism of renal origin: Secondary | ICD-10-CM | POA: Diagnosis not present

## 2021-09-30 DIAGNOSIS — Z992 Dependence on renal dialysis: Secondary | ICD-10-CM | POA: Diagnosis not present

## 2021-09-30 DIAGNOSIS — N186 End stage renal disease: Secondary | ICD-10-CM | POA: Diagnosis not present

## 2021-10-03 DIAGNOSIS — N2581 Secondary hyperparathyroidism of renal origin: Secondary | ICD-10-CM | POA: Diagnosis not present

## 2021-10-03 DIAGNOSIS — Z992 Dependence on renal dialysis: Secondary | ICD-10-CM | POA: Diagnosis not present

## 2021-10-03 DIAGNOSIS — N186 End stage renal disease: Secondary | ICD-10-CM | POA: Diagnosis not present

## 2021-10-05 DIAGNOSIS — N2581 Secondary hyperparathyroidism of renal origin: Secondary | ICD-10-CM | POA: Diagnosis not present

## 2021-10-05 DIAGNOSIS — N186 End stage renal disease: Secondary | ICD-10-CM | POA: Diagnosis not present

## 2021-10-05 DIAGNOSIS — Z992 Dependence on renal dialysis: Secondary | ICD-10-CM | POA: Diagnosis not present

## 2021-10-07 DIAGNOSIS — N186 End stage renal disease: Secondary | ICD-10-CM | POA: Diagnosis not present

## 2021-10-07 DIAGNOSIS — Z992 Dependence on renal dialysis: Secondary | ICD-10-CM | POA: Diagnosis not present

## 2021-10-07 DIAGNOSIS — N2581 Secondary hyperparathyroidism of renal origin: Secondary | ICD-10-CM | POA: Diagnosis not present

## 2021-10-10 DIAGNOSIS — N2581 Secondary hyperparathyroidism of renal origin: Secondary | ICD-10-CM | POA: Diagnosis not present

## 2021-10-10 DIAGNOSIS — Z992 Dependence on renal dialysis: Secondary | ICD-10-CM | POA: Diagnosis not present

## 2021-10-10 DIAGNOSIS — N186 End stage renal disease: Secondary | ICD-10-CM | POA: Diagnosis not present

## 2021-10-12 DIAGNOSIS — N186 End stage renal disease: Secondary | ICD-10-CM | POA: Diagnosis not present

## 2021-10-12 DIAGNOSIS — N2581 Secondary hyperparathyroidism of renal origin: Secondary | ICD-10-CM | POA: Diagnosis not present

## 2021-10-12 DIAGNOSIS — Z992 Dependence on renal dialysis: Secondary | ICD-10-CM | POA: Diagnosis not present

## 2021-10-14 DIAGNOSIS — N2581 Secondary hyperparathyroidism of renal origin: Secondary | ICD-10-CM | POA: Diagnosis not present

## 2021-10-14 DIAGNOSIS — Z992 Dependence on renal dialysis: Secondary | ICD-10-CM | POA: Diagnosis not present

## 2021-10-14 DIAGNOSIS — N186 End stage renal disease: Secondary | ICD-10-CM | POA: Diagnosis not present

## 2021-10-17 DIAGNOSIS — N186 End stage renal disease: Secondary | ICD-10-CM | POA: Diagnosis not present

## 2021-10-17 DIAGNOSIS — N2581 Secondary hyperparathyroidism of renal origin: Secondary | ICD-10-CM | POA: Diagnosis not present

## 2021-10-17 DIAGNOSIS — Z992 Dependence on renal dialysis: Secondary | ICD-10-CM | POA: Diagnosis not present

## 2021-10-19 DIAGNOSIS — Z992 Dependence on renal dialysis: Secondary | ICD-10-CM | POA: Diagnosis not present

## 2021-10-19 DIAGNOSIS — N2581 Secondary hyperparathyroidism of renal origin: Secondary | ICD-10-CM | POA: Diagnosis not present

## 2021-10-19 DIAGNOSIS — N186 End stage renal disease: Secondary | ICD-10-CM | POA: Diagnosis not present

## 2021-10-21 DIAGNOSIS — N2581 Secondary hyperparathyroidism of renal origin: Secondary | ICD-10-CM | POA: Diagnosis not present

## 2021-10-21 DIAGNOSIS — Z992 Dependence on renal dialysis: Secondary | ICD-10-CM | POA: Diagnosis not present

## 2021-10-21 DIAGNOSIS — N186 End stage renal disease: Secondary | ICD-10-CM | POA: Diagnosis not present

## 2021-10-24 DIAGNOSIS — Q612 Polycystic kidney, adult type: Secondary | ICD-10-CM | POA: Diagnosis not present

## 2021-10-24 DIAGNOSIS — N2581 Secondary hyperparathyroidism of renal origin: Secondary | ICD-10-CM | POA: Diagnosis not present

## 2021-10-24 DIAGNOSIS — N186 End stage renal disease: Secondary | ICD-10-CM | POA: Diagnosis not present

## 2021-10-24 DIAGNOSIS — Z992 Dependence on renal dialysis: Secondary | ICD-10-CM | POA: Diagnosis not present

## 2021-10-26 DIAGNOSIS — N186 End stage renal disease: Secondary | ICD-10-CM | POA: Diagnosis not present

## 2021-10-26 DIAGNOSIS — N2581 Secondary hyperparathyroidism of renal origin: Secondary | ICD-10-CM | POA: Diagnosis not present

## 2021-10-26 DIAGNOSIS — Z992 Dependence on renal dialysis: Secondary | ICD-10-CM | POA: Diagnosis not present

## 2021-10-28 DIAGNOSIS — N186 End stage renal disease: Secondary | ICD-10-CM | POA: Diagnosis not present

## 2021-10-28 DIAGNOSIS — Z992 Dependence on renal dialysis: Secondary | ICD-10-CM | POA: Diagnosis not present

## 2021-10-28 DIAGNOSIS — N2581 Secondary hyperparathyroidism of renal origin: Secondary | ICD-10-CM | POA: Diagnosis not present

## 2021-10-31 DIAGNOSIS — Z992 Dependence on renal dialysis: Secondary | ICD-10-CM | POA: Diagnosis not present

## 2021-10-31 DIAGNOSIS — N186 End stage renal disease: Secondary | ICD-10-CM | POA: Diagnosis not present

## 2021-10-31 DIAGNOSIS — N2581 Secondary hyperparathyroidism of renal origin: Secondary | ICD-10-CM | POA: Diagnosis not present

## 2021-11-02 DIAGNOSIS — N186 End stage renal disease: Secondary | ICD-10-CM | POA: Diagnosis not present

## 2021-11-02 DIAGNOSIS — N2581 Secondary hyperparathyroidism of renal origin: Secondary | ICD-10-CM | POA: Diagnosis not present

## 2021-11-02 DIAGNOSIS — Z992 Dependence on renal dialysis: Secondary | ICD-10-CM | POA: Diagnosis not present

## 2021-11-04 DIAGNOSIS — N2581 Secondary hyperparathyroidism of renal origin: Secondary | ICD-10-CM | POA: Diagnosis not present

## 2021-11-04 DIAGNOSIS — Z992 Dependence on renal dialysis: Secondary | ICD-10-CM | POA: Diagnosis not present

## 2021-11-04 DIAGNOSIS — N186 End stage renal disease: Secondary | ICD-10-CM | POA: Diagnosis not present

## 2021-11-07 DIAGNOSIS — N186 End stage renal disease: Secondary | ICD-10-CM | POA: Diagnosis not present

## 2021-11-07 DIAGNOSIS — Z992 Dependence on renal dialysis: Secondary | ICD-10-CM | POA: Diagnosis not present

## 2021-11-07 DIAGNOSIS — N2581 Secondary hyperparathyroidism of renal origin: Secondary | ICD-10-CM | POA: Diagnosis not present

## 2021-11-09 DIAGNOSIS — N2581 Secondary hyperparathyroidism of renal origin: Secondary | ICD-10-CM | POA: Diagnosis not present

## 2021-11-09 DIAGNOSIS — N186 End stage renal disease: Secondary | ICD-10-CM | POA: Diagnosis not present

## 2021-11-09 DIAGNOSIS — Z992 Dependence on renal dialysis: Secondary | ICD-10-CM | POA: Diagnosis not present

## 2021-11-11 DIAGNOSIS — N186 End stage renal disease: Secondary | ICD-10-CM | POA: Diagnosis not present

## 2021-11-11 DIAGNOSIS — N2581 Secondary hyperparathyroidism of renal origin: Secondary | ICD-10-CM | POA: Diagnosis not present

## 2021-11-11 DIAGNOSIS — Z992 Dependence on renal dialysis: Secondary | ICD-10-CM | POA: Diagnosis not present

## 2021-11-14 DIAGNOSIS — Z992 Dependence on renal dialysis: Secondary | ICD-10-CM | POA: Diagnosis not present

## 2021-11-14 DIAGNOSIS — N2581 Secondary hyperparathyroidism of renal origin: Secondary | ICD-10-CM | POA: Diagnosis not present

## 2021-11-14 DIAGNOSIS — N186 End stage renal disease: Secondary | ICD-10-CM | POA: Diagnosis not present

## 2021-11-16 DIAGNOSIS — N186 End stage renal disease: Secondary | ICD-10-CM | POA: Diagnosis not present

## 2021-11-16 DIAGNOSIS — N2581 Secondary hyperparathyroidism of renal origin: Secondary | ICD-10-CM | POA: Diagnosis not present

## 2021-11-16 DIAGNOSIS — Z992 Dependence on renal dialysis: Secondary | ICD-10-CM | POA: Diagnosis not present

## 2021-11-18 DIAGNOSIS — N2581 Secondary hyperparathyroidism of renal origin: Secondary | ICD-10-CM | POA: Diagnosis not present

## 2021-11-18 DIAGNOSIS — N186 End stage renal disease: Secondary | ICD-10-CM | POA: Diagnosis not present

## 2021-11-18 DIAGNOSIS — Z992 Dependence on renal dialysis: Secondary | ICD-10-CM | POA: Diagnosis not present

## 2021-11-20 ENCOUNTER — Encounter (HOSPITAL_COMMUNITY): Payer: Self-pay

## 2021-11-20 ENCOUNTER — Other Ambulatory Visit (HOSPITAL_BASED_OUTPATIENT_CLINIC_OR_DEPARTMENT_OTHER): Payer: Self-pay

## 2021-11-20 ENCOUNTER — Other Ambulatory Visit: Payer: Self-pay

## 2021-11-20 ENCOUNTER — Emergency Department (HOSPITAL_BASED_OUTPATIENT_CLINIC_OR_DEPARTMENT_OTHER)
Admission: EM | Admit: 2021-11-20 | Discharge: 2021-11-20 | Disposition: A | Discharge status: Home / Self Care | Payer: Medicare HMO | Attending: Student | Admitting: Student

## 2021-11-20 ENCOUNTER — Emergency Department (HOSPITAL_BASED_OUTPATIENT_CLINIC_OR_DEPARTMENT_OTHER): Payer: Medicare HMO | Admitting: Radiology

## 2021-11-20 DIAGNOSIS — N186 End stage renal disease: Secondary | ICD-10-CM | POA: Insufficient documentation

## 2021-11-20 DIAGNOSIS — Z79899 Other long term (current) drug therapy: Secondary | ICD-10-CM | POA: Insufficient documentation

## 2021-11-20 DIAGNOSIS — I120 Hypertensive chronic kidney disease with stage 5 chronic kidney disease or end stage renal disease: Secondary | ICD-10-CM | POA: Insufficient documentation

## 2021-11-20 DIAGNOSIS — R002 Palpitations: Secondary | ICD-10-CM

## 2021-11-20 DIAGNOSIS — Z85528 Personal history of other malignant neoplasm of kidney: Secondary | ICD-10-CM | POA: Insufficient documentation

## 2021-11-20 DIAGNOSIS — R0602 Shortness of breath: Secondary | ICD-10-CM | POA: Diagnosis not present

## 2021-11-20 DIAGNOSIS — R000 Tachycardia, unspecified: Secondary | ICD-10-CM | POA: Insufficient documentation

## 2021-11-20 DIAGNOSIS — I2510 Atherosclerotic heart disease of native coronary artery without angina pectoris: Secondary | ICD-10-CM | POA: Insufficient documentation

## 2021-11-20 DIAGNOSIS — Z20822 Contact with and (suspected) exposure to covid-19: Secondary | ICD-10-CM | POA: Insufficient documentation

## 2021-11-20 DIAGNOSIS — E039 Hypothyroidism, unspecified: Secondary | ICD-10-CM | POA: Insufficient documentation

## 2021-11-20 DIAGNOSIS — Z7982 Long term (current) use of aspirin: Secondary | ICD-10-CM | POA: Insufficient documentation

## 2021-11-20 DIAGNOSIS — Z992 Dependence on renal dialysis: Secondary | ICD-10-CM | POA: Insufficient documentation

## 2021-11-20 DIAGNOSIS — R0600 Dyspnea, unspecified: Secondary | ICD-10-CM | POA: Diagnosis not present

## 2021-11-20 DIAGNOSIS — R972 Elevated prostate specific antigen [PSA]: Secondary | ICD-10-CM | POA: Diagnosis not present

## 2021-11-20 LAB — TROPONIN I (HIGH SENSITIVITY): Troponin I (High Sensitivity): 8 ng/L (ref <18)

## 2021-11-20 LAB — CBC WITH DIFFERENTIAL/PLATELET
Abs Immature Granulocytes: 0.04 10*3/uL (ref 0.00–0.07)
Basophils Absolute: 0 10*3/uL (ref 0.0–0.1)
Basophils Relative: 1 %
Eosinophils Absolute: 0.7 10*3/uL — ABNORMAL HIGH (ref 0.0–0.5)
Eosinophils Relative: 9 %
HCT: 31.2 % — ABNORMAL LOW (ref 39.0–52.0)
Hemoglobin: 9.9 g/dL — ABNORMAL LOW (ref 13.0–17.0)
Immature Granulocytes: 1 %
Lymphocytes Relative: 28 %
Lymphs Abs: 2.2 10*3/uL (ref 0.7–4.0)
MCH: 29.2 pg (ref 26.0–34.0)
MCHC: 31.7 g/dL (ref 30.0–36.0)
MCV: 92 fL (ref 80.0–100.0)
Monocytes Absolute: 0.8 10*3/uL (ref 0.1–1.0)
Monocytes Relative: 11 %
Neutro Abs: 4 10*3/uL (ref 1.7–7.7)
Neutrophils Relative %: 50 %
Platelets: 230 10*3/uL (ref 150–400)
RBC: 3.39 MIL/uL — ABNORMAL LOW (ref 4.22–5.81)
RDW: 14.9 % (ref 11.5–15.5)
WBC: 7.7 10*3/uL (ref 4.0–10.5)
nRBC: 0 % (ref 0.0–0.2)

## 2021-11-20 LAB — COMPREHENSIVE METABOLIC PANEL
ALT: 23 U/L (ref 0–44)
AST: 16 U/L (ref 15–41)
Albumin: 4.1 g/dL (ref 3.5–5.0)
Alkaline Phosphatase: 88 U/L (ref 38–126)
Anion gap: 13 (ref 5–15)
BUN: 81 mg/dL — ABNORMAL HIGH (ref 8–23)
CO2: 24 mmol/L (ref 22–32)
Calcium: 9.6 mg/dL (ref 8.9–10.3)
Chloride: 99 mmol/L (ref 98–111)
Creatinine, Ser: 10.58 mg/dL — ABNORMAL HIGH (ref 0.61–1.24)
GFR, Estimated: 5 mL/min — ABNORMAL LOW (ref >60)
Glucose, Bld: 85 mg/dL (ref 70–99)
Potassium: 5.1 mmol/L (ref 3.5–5.1)
Sodium: 136 mmol/L (ref 135–145)
Total Bilirubin: 0.4 mg/dL (ref 0.3–1.2)
Total Protein: 7 g/dL (ref 6.5–8.1)

## 2021-11-20 LAB — RESP PANEL BY RT-PCR (FLU A&B, COVID) ARPGX2
Influenza A by PCR: NEGATIVE
Influenza B by PCR: NEGATIVE
SARS Coronavirus 2 by RT PCR: NEGATIVE

## 2021-11-20 MED ORDER — HYDROXYZINE HCL 25 MG PO TABS
25.0000 mg | ORAL_TABLET | Freq: Four times a day (QID) | ORAL | 0 refills | Status: DC
  Filled 2021-11-20: qty 12, 3d supply, fill #0

## 2021-11-20 MED ORDER — HYDROXYZINE HCL 25 MG PO TABS
25.0000 mg | ORAL_TABLET | Freq: Once | ORAL | Status: AC
  Administered 2021-11-20: 25 mg via ORAL
  Filled 2021-11-20: qty 1

## 2021-11-20 NOTE — ED Provider Notes (Signed)
[facility] EMERGENCY DEPT Provider Note  CSN: [id number] Arrival date & time: 11/20/21 1001  Chief Complaint(s) Tachycardia (anxiety) and Anxiety  HPI Phillip Blair is a 66 y.o. male with PMH iron deficiency anemia, polycystic kidney disease complicated by ESRD on hemodialysis Tuesday Thursday Saturday with good compliance, HLD, secondary hyperparathyroidism who presents emergency department for evaluation of tachycardia and shortness of breath.  Patient states that he has been compliant with his dialysis sessions but over the last 48 hours he has had decreased exercise tolerance and felt heart palpitations.  He states he is currently feeling these palpitations but has normal sinus rhythm on the monitor today.  Denies blood in stool, chest pain, abdominal pain, nausea, vomiting or other systemic symptoms.  Patient states he feels increasingly anxious.   Anxiety Associated symptoms include shortness of breath.   Past Medical History Past Medical History:  Diagnosis Date   Allergy    Anemia    low iron   Chronic kidney disease    denies   Coronary artery disease    02/06/19: Mild non-obstructive disease on left, 100% mid RCA with left-to-right collaterals. No PCI. S/p attempted RCA PCI 04/2019. S/p DES pRCA with concern for dissection, stented through mRCA with good result 07/17/19. DAPT x 6 months rec. [facility])   Dehiscence of incision    antecubital   ESRD (end stage renal disease) ([place])    s/p LUE basilic vein transpositiono 2017; s/p peritoneal dialysis catheter 12/31/17   Family history of kidney stone    GERD (gastroesophageal reflux disease)    occ   Gout    History of kidney stones    passed stone , no surgery   Hypercalcemia    Hyperlipidemia    Hypertension    does not see a cardiologist   Hypothyroidism    Parathyroid adenoma    Polycystic kidney disease    Renal cancer ([place])    kidney   Sleep apnea    no longer uses cpap, has lost weight   Wears  glasses    Patient Active Problem List   Diagnosis Date Noted   Secondary hyperparathyroidism of renal origin ([place]) 04/16/2019   Hyperparathyroidism, secondary ([place]) 04/12/2019   Symptomatic anemia    Hiatal hernia with gastroesophageal reflux disease and esophagitis    Hx of adenomatous polyp of colon 07/30/2017   Renal mass 01/19/2015   Parathyroid adenoma 03/13/2013   Pain in joint, shoulder region 02/25/2013   Tendonitis of shoulder 02/25/2013   Hypercalcemia 02/25/2013   HLD (hyperlipidemia) 01/14/2013   Chronic kidney disease, stage V ([place]) 08/11/2012   SLEEP APNEA, OBSTRUCTIVE, SEVERE 06/15/2010   HYPERSOMNIA, ASSOCIATED WITH SLEEP APNEA 05/11/2010   GERD 10/28/2009   GOUT 04/13/2008   HYPERLIPIDEMIA 05/02/2007   Essential hypertension 05/02/2007   ALLERGIC RHINITIS 05/02/2007   NEPHROLITHIASIS, HX OF 05/02/2007   Home Medication(s) Prior to Admission medications   Medication Sig Start Date End Date Taking? Authorizing Provider  acetaminophen (TYLENOL) 500 MG tablet Take 500-1,000 mg by mouth every 6 (six) hours as needed for moderate pain or headache.    [provider]  albuterol (VENTOLIN HFA) 108 (90 Base) MCG/ACT inhaler Inhale 2 puffs into the lungs every 6 (six) hours as needed for wheezing or shortness of breath (cough). 06/30/19   Julian Hy, DO  allopurinol (ZYLOPRIM) 100 MG tablet Take 100 mg by mouth daily. 02/22/16   [provider]  amLODipine (NORVASC) 10 MG tablet Take 10 mg by mouth daily.  Patient not taking: Reported on 08/31/2020    [provider]  aspirin EC 81 MG tablet Take 81 mg by mouth daily.    [provider]  b complex-vitamin c-folic acid (NEPHRO-VITE) 0.8 MG TABS tablet Take 1 tablet by mouth daily. 05/30/18   [provider]  ciclopirox (PENLAC) 8 % solution Apply topically at bedtime. Apply over nail and surrounding skin. Apply daily over previous coat. After seven (7) days, may remove with alcohol  and continue cycle. 09/13/20   Trula Slade, DPM  clobetasol (OLUX) 0.05 % topical foam Apply topically daily. Apply daily after shower 09/06/20   Lavonna Monarch, MD  ferric citrate (AURYXIA) 1 GM 210 MG(Fe) tablet Take 420 mg by mouth 3 (three) times daily with meals.     [provider]  fluticasone (FLONASE) 50 MCG/ACT nasal spray Place 1 spray into both nostrils daily as needed for allergies.     [provider]  levothyroxine (SYNTHROID) 50 MCG tablet Take 50 mcg by mouth daily before breakfast. 08/03/19   [provider]  nitroGLYCERIN (NITROSTAT) 0.4 MG SL tablet Place 0.4 mg under the tongue every 2 (two) hours as needed for chest pain.  12/30/19   [provider]  omeprazole (PRILOSEC) 20 MG capsule Take 20 mg by mouth daily.     [provider]  Polyethylene Glycol 400 (VISINE DRY EYE RELIEF OP) Place 1 drop into both eyes daily as needed (dry eyes).    [provider]  rosuvastatin (CRESTOR) 5 MG tablet Take 5 mg by mouth every Monday, Wednesday, and Friday.    [provider]                                                                                                                                    Past Surgical History Past Surgical History:  Procedure Laterality Date   AV FISTULA PLACEMENT Left 03/30/2016   Procedure: FIRST STAGE BASILIC VEIN TRANSPOSITION LEFT UPPER ARM;  Surgeon: Serafina Mitchell, MD;  Location: [place];  Service: Vascular;  Laterality: Left;   BASCILIC VEIN TRANSPOSITION Left 06/13/2016   Procedure: SECOND STAGE BASILIC VEIN TRANSPOSITION;  Surgeon: Serafina Mitchell, MD;  Location: [place];  Service: Vascular;  Laterality: Left;   [place] Left 02/10/2020   Procedure: Revision of LEFT STAGE [place].;  Surgeon: Serafina Mitchell, MD;  Location: [place];  Service: Vascular;  Laterality: Left;   CARDIAC CATHETERIZATION  05/11/2019   [facility]   COLONOSCOPY      DRAINAGE AND CLOSURE OF LYMPHOCELE Left 03/04/2020   Procedure: INCISION AND DRAINAGE OF ARM;  Surgeon: Elam Dutch, MD;  Location[facility] OR;  Service: Vascular;  Laterality: Left;   ESOPHAGOGASTRODUODENOSCOPY (EGD) WITH PROPOFOL N/A 01/03/2018   Procedure: ESOPHAGOGASTRODUODENOSCOPY (EGD) WITH PROPOFOL;  Surgeon: Jackquline Denmark, MD;  Location: [facility] ENDOSCOPY;  Service: Endoscopy;  Laterality: N/A;  HERNIA REPAIR     MINIMALLY INVASIVE RADIOACTIVE PARATHYROIDECTOMY N/A 05/04/2013   Procedure: PARATHYROIDECTOMY MINIMALLY INVASIVE;  Surgeon: Harl Bowie, MD;  Location: [place];  Service: General;  Laterality: N/A;   MINIMALLY INVASIVE RADIOACTIVE PARATHYROIDECTOMY N/A 10/20/2013   Procedure: MINIMALLY INVASIVE PARATHYROIDECTOMY CONVERTED TO COMPLETE NECK EXPLORATION;  Surgeon: Harl Bowie, MD;  Location: [place];  Service: General;  Laterality: N/A;   NASAL SEPTOPLASTY W/ TURBINOPLASTY     PARATHYROIDECTOMY  04/16/2019   PARATHYROIDECTOMY N/A 04/16/2019   Procedure: NECK EXPLORATION AND PARATHYROIDECTOMY;  Surgeon: Armandina Gemma, MD;  Location: [place];  Service: General;  Laterality: N/A;   ROBOT ASSISTED LAPAROSCOPIC NEPHRECTOMY Left 01/19/2015   Procedure: ROBOTIC ASSISTED LAPAROSCOPIC RADICAL NEPHRECTOMY AND INTRAOPERATIVE ULTRASOUND ;  Surgeon: Alexis Frock, MD;  Location: [facility];  Service: Urology;  Laterality: Left;   THYROID LOBECTOMY Right 04/16/2019   Procedure: RIGHT THYROID LOBECTOMY;  Surgeon: Armandina Gemma, MD;  Location: [facility] OR;  Service: General;  Laterality: Right;   VASECTOMY     Family History Family History  Problem Relation Age of Onset   Polycystic kidney disease Mother    Blindness Father        passed from COVID   Asthma Child    Colon cancer Neg Hx    Rectal cancer Neg Hx    Stomach cancer Neg Hx    Liver cancer Neg Hx    Esophageal cancer Neg Hx     Social History Social History   Tobacco Use   Smoking status: Never   Smokeless tobacco: Never  Vaping  Use   Vaping Use: Never used  Substance Use Topics   Alcohol use: Yes    Alcohol/week: 2.0 standard drinks    Types: 2 Glasses of wine per week    Comment: occasional beer/wine   Drug use: No   Allergies Ivp dye [iodinated contrast media], Vancomycin, and Iodine-131  Review of Systems Review of Systems  Respiratory:  Positive for shortness of breath.   Cardiovascular:  Positive for palpitations.  Psychiatric/Behavioral:  The patient is nervous/anxious.    Physical Exam Vital Signs  I have reviewed the triage vital signs BP 133/79    Pulse (!) 54    Temp 98.3 F (36.8 C) (Oral)    Resp 15    Ht 6' (1.829 m)    Wt 91.2 kg    SpO2 95%    BMI 27.26 kg/m   Physical Exam Vitals and nursing note reviewed.  Constitutional:      General: He is not in acute distress.    Appearance: He is well-developed.  HENT:     Head: Normocephalic and atraumatic.  Eyes:     Conjunctiva/sclera: Conjunctivae normal.  Cardiovascular:     Rate and Rhythm: Normal rate and regular rhythm.     Heart sounds: No murmur heard. Pulmonary:     Effort: Pulmonary effort is normal. No respiratory distress.     Breath sounds: Normal breath sounds.  Abdominal:     Palpations: Abdomen is soft.     Tenderness: There is no abdominal tenderness.  Musculoskeletal:        General: No swelling.     Cervical back: Neck supple.  Skin:    General: Skin is warm and dry.     Capillary Refill: Capillary refill takes less than 2 seconds.  Neurological:     Mental Status: He is alert.  Psychiatric:        Mood and Affect: Mood  normal.    ED Results and Treatments Labs (all labs ordered are listed, but only abnormal results are displayed) Labs Reviewed  COMPREHENSIVE METABOLIC PANEL - Abnormal; Notable for the following components:      Result Value   BUN 81 (*)    Creatinine, Ser 10.58 (*)    GFR, Estimated 5 (*)    All other components within normal limits  CBC WITH DIFFERENTIAL/PLATELET - Abnormal;  Notable for the following components:   RBC 3.39 (*)    Hemoglobin 9.9 (*)    HCT 31.2 (*)    Eosinophils Absolute 0.7 (*)    All other components within normal limits  RESP PANEL BY RT-PCR (FLU A&B, COVID) ARPGX2  TROPONIN I (HIGH SENSITIVITY)                                                                                                                          Radiology DG Chest 2 View  Result Date: 11/20/2021 CLINICAL DATA:  Dyspnea EXAM: CHEST - 2 VIEW COMPARISON:  Chest x-ray 11/24/2019 FINDINGS: Heart size and mediastinal contours are within normal limits. No suspicious pulmonary opacities identified. Surgical clips projecting over the neck and superior mediastinum. No pleural effusion or pneumothorax visualized. No acute osseous abnormality appreciated. IMPRESSION: No acute intrathoracic process identified. Electronically Signed   By: Ofilia Neas M.D.   On: 11/20/2021 10:53    Pertinent labs & imaging results that were available during my care of the patient were reviewed by me and considered in my medical decision making (see MDM for details).  Medications Ordered in ED Medications - No data to display                                                                                                                                   Procedures Procedures  (including critical care time)  Medical Decision Making / ED Course   This patient presents to the ED for concern of palpitations shortness of breath, this involves an extensive number of treatment options, and is a complaint that carries with it a high risk of complications and morbidity.  The differential diagnosis includes electrolyte abnormality, anemia, anxiety, ACS  MDM: Patient seen emergency department for evaluation of shortness of breath and palpitations.  Physical exam largely unremarkable.  Fistula in place and appropriately pulsatile.  Laboratory evaluation while outside of a hemoglobin of 9.9 which is near  the patient's baseline.  Last hemoglobin obtained at dialysis was 10.1.  Likely secondary to his ESRD and uderlying iron deficiency anemia.  Chest x-ray unremarkable. ECG with no PVCs or dysrhythmia and this ECG was obtained while the patient was having palpitations.  He was given a single dose of Atarax and his symptoms resolved.  Suspect an element of anxiety but he will require PCP follow-up for formal diagnosis.  Patient will be discharged on Atarax as he had symptomatic improvement here and he will follow-up with his primary care physician.   Additional history obtained:  -External records from outside source obtained and reviewed including: Chart review including previous notes, labs, imaging, consultation notes   Lab Tests: -I ordered, reviewed, and interpreted labs.   The pertinent results include:   Labs Reviewed  COMPREHENSIVE METABOLIC PANEL - Abnormal; Notable for the following components:      Result Value   BUN 81 (*)    Creatinine, Ser 10.58 (*)    GFR, Estimated 5 (*)    All other components within normal limits  CBC WITH DIFFERENTIAL/PLATELET - Abnormal; Notable for the following components:   RBC 3.39 (*)    Hemoglobin 9.9 (*)    HCT 31.2 (*)    Eosinophils Absolute 0.7 (*)    All other components within normal limits  RESP PANEL BY RT-PCR (FLU A&B, COVID) ARPGX2  TROPONIN I (HIGH SENSITIVITY)      EKG   EKG Interpretation  Date/Time:  Monday November 20 2021 10:12:29 EST Ventricular Rate:  68 PR Interval:  213 QRS Duration: 93 QT Interval:  418 QTC Calculation: 445 R Axis:   46 Text Interpretation: Sinus rhythm Fragmented QRS in lead III Confirmed by [place] ([id number]) on 11/20/2021 12:09:40 PM         Imaging Studies ordered: I ordered imaging studies including CXR I independently visualized and interpreted imaging. I agree with the radiologist interpretation   Medicines ordered and prescription drug management: No orders of the defined types  were placed in this encounter.   -I have reviewed the patients home medicines and have made adjustments as needed  Critical interventions none   Cardiac Monitoring: The patient was maintained on a cardiac monitor.  I personally viewed and interpreted the cardiac monitored which showed an underlying rhythm of: NSR, sinus bradycardia   Social Determinants of Health:  Factors impacting patients care include: none   Reevaluation: After the interventions noted above, I reevaluated the patient and found that they have :improved  Co morbidities that complicate the patient evaluation  Past Medical History:  Diagnosis Date   Allergy    Anemia    low iron   Chronic kidney disease    denies   Coronary artery disease    02/06/19: Mild non-obstructive disease on left, 100% mid RCA with left-to-right collaterals. No PCI. S/p attempted RCA PCI 04/2019. S/p DES pRCA with concern for dissection, stented through mRCA with good result 07/17/19. DAPT x 6 months rec. [facility])   Dehiscence of incision    antecubital   ESRD (end stage renal disease) ([place])    s/p LUE basilic vein transpositiono 2017; s/p peritoneal dialysis catheter 12/31/17   Family history of kidney stone    GERD (gastroesophageal reflux disease)    occ   Gout    History of kidney stones    passed stone , no surgery   Hypercalcemia    Hyperlipidemia    Hypertension    does not see a cardiologist  Hypothyroidism    Parathyroid adenoma    Polycystic kidney disease    Renal cancer ([place])    kidney   Sleep apnea    no longer uses cpap, has lost weight   Wears glasses       Dispostion: I considered admission for this patient, but patient work-up reassuringly negative here in the emergency department and is safe for outpatient follow-up.     Final Clinical Impression(s) / ED Diagnoses Final diagnoses:  None     @PCDICTATION @    Sricharan Lacomb, Debe Coder, MD 11/20/21 1210

## 2021-11-20 NOTE — ED Notes (Signed)
Pt d/c home per MD order. Discharge summary reviewed with pt, pt verbalizes understanding. Ambulatory off unit. No s/s of acute distress noted at discharge.  °

## 2021-11-20 NOTE — ED Triage Notes (Signed)
Pt to ED via POV c/o fast heart rate and anxiety over the past week. HX: cardiac stent x 2 placement , Dialysis pt,  T, THUR, SAT, received full treatment Saturday. Reports symptoms are worse with activity and have relief with rest.

## 2021-11-21 DIAGNOSIS — N186 End stage renal disease: Secondary | ICD-10-CM | POA: Diagnosis not present

## 2021-11-21 DIAGNOSIS — Q612 Polycystic kidney, adult type: Secondary | ICD-10-CM | POA: Diagnosis not present

## 2021-11-21 DIAGNOSIS — Z992 Dependence on renal dialysis: Secondary | ICD-10-CM | POA: Diagnosis not present

## 2021-11-21 DIAGNOSIS — N2581 Secondary hyperparathyroidism of renal origin: Secondary | ICD-10-CM | POA: Diagnosis not present

## 2021-11-23 DIAGNOSIS — Z992 Dependence on renal dialysis: Secondary | ICD-10-CM | POA: Diagnosis not present

## 2021-11-23 DIAGNOSIS — N186 End stage renal disease: Secondary | ICD-10-CM | POA: Diagnosis not present

## 2021-11-23 DIAGNOSIS — N2581 Secondary hyperparathyroidism of renal origin: Secondary | ICD-10-CM | POA: Diagnosis not present

## 2021-11-25 DIAGNOSIS — Z992 Dependence on renal dialysis: Secondary | ICD-10-CM | POA: Diagnosis not present

## 2021-11-25 DIAGNOSIS — N2581 Secondary hyperparathyroidism of renal origin: Secondary | ICD-10-CM | POA: Diagnosis not present

## 2021-11-25 DIAGNOSIS — N186 End stage renal disease: Secondary | ICD-10-CM | POA: Diagnosis not present

## 2021-11-27 DIAGNOSIS — C642 Malignant neoplasm of left kidney, except renal pelvis: Secondary | ICD-10-CM | POA: Diagnosis not present

## 2021-11-27 DIAGNOSIS — Z905 Acquired absence of kidney: Secondary | ICD-10-CM | POA: Diagnosis not present

## 2021-11-27 DIAGNOSIS — N5201 Erectile dysfunction due to arterial insufficiency: Secondary | ICD-10-CM | POA: Diagnosis not present

## 2021-11-27 DIAGNOSIS — R972 Elevated prostate specific antigen [PSA]: Secondary | ICD-10-CM | POA: Diagnosis not present

## 2021-11-28 DIAGNOSIS — N2581 Secondary hyperparathyroidism of renal origin: Secondary | ICD-10-CM | POA: Diagnosis not present

## 2021-11-28 DIAGNOSIS — Z992 Dependence on renal dialysis: Secondary | ICD-10-CM | POA: Diagnosis not present

## 2021-11-28 DIAGNOSIS — N186 End stage renal disease: Secondary | ICD-10-CM | POA: Diagnosis not present

## 2021-11-30 DIAGNOSIS — N186 End stage renal disease: Secondary | ICD-10-CM | POA: Diagnosis not present

## 2021-11-30 DIAGNOSIS — N2581 Secondary hyperparathyroidism of renal origin: Secondary | ICD-10-CM | POA: Diagnosis not present

## 2021-11-30 DIAGNOSIS — Z992 Dependence on renal dialysis: Secondary | ICD-10-CM | POA: Diagnosis not present

## 2021-12-02 DIAGNOSIS — Z992 Dependence on renal dialysis: Secondary | ICD-10-CM | POA: Diagnosis not present

## 2021-12-02 DIAGNOSIS — N2581 Secondary hyperparathyroidism of renal origin: Secondary | ICD-10-CM | POA: Diagnosis not present

## 2021-12-02 DIAGNOSIS — N186 End stage renal disease: Secondary | ICD-10-CM | POA: Diagnosis not present

## 2021-12-05 DIAGNOSIS — Z992 Dependence on renal dialysis: Secondary | ICD-10-CM | POA: Diagnosis not present

## 2021-12-05 DIAGNOSIS — N186 End stage renal disease: Secondary | ICD-10-CM | POA: Diagnosis not present

## 2021-12-05 DIAGNOSIS — N2581 Secondary hyperparathyroidism of renal origin: Secondary | ICD-10-CM | POA: Diagnosis not present

## 2021-12-07 DIAGNOSIS — N2581 Secondary hyperparathyroidism of renal origin: Secondary | ICD-10-CM | POA: Diagnosis not present

## 2021-12-07 DIAGNOSIS — Z992 Dependence on renal dialysis: Secondary | ICD-10-CM | POA: Diagnosis not present

## 2021-12-07 DIAGNOSIS — N186 End stage renal disease: Secondary | ICD-10-CM | POA: Diagnosis not present

## 2021-12-09 DIAGNOSIS — N186 End stage renal disease: Secondary | ICD-10-CM | POA: Diagnosis not present

## 2021-12-09 DIAGNOSIS — N2581 Secondary hyperparathyroidism of renal origin: Secondary | ICD-10-CM | POA: Diagnosis not present

## 2021-12-09 DIAGNOSIS — Z992 Dependence on renal dialysis: Secondary | ICD-10-CM | POA: Diagnosis not present

## 2021-12-12 DIAGNOSIS — N2581 Secondary hyperparathyroidism of renal origin: Secondary | ICD-10-CM | POA: Diagnosis not present

## 2021-12-12 DIAGNOSIS — Z992 Dependence on renal dialysis: Secondary | ICD-10-CM | POA: Diagnosis not present

## 2021-12-12 DIAGNOSIS — N186 End stage renal disease: Secondary | ICD-10-CM | POA: Diagnosis not present

## 2021-12-14 DIAGNOSIS — N2581 Secondary hyperparathyroidism of renal origin: Secondary | ICD-10-CM | POA: Diagnosis not present

## 2021-12-14 DIAGNOSIS — N186 End stage renal disease: Secondary | ICD-10-CM | POA: Diagnosis not present

## 2021-12-14 DIAGNOSIS — Z992 Dependence on renal dialysis: Secondary | ICD-10-CM | POA: Diagnosis not present

## 2021-12-16 DIAGNOSIS — N2581 Secondary hyperparathyroidism of renal origin: Secondary | ICD-10-CM | POA: Diagnosis not present

## 2021-12-16 DIAGNOSIS — Z992 Dependence on renal dialysis: Secondary | ICD-10-CM | POA: Diagnosis not present

## 2021-12-16 DIAGNOSIS — N186 End stage renal disease: Secondary | ICD-10-CM | POA: Diagnosis not present

## 2021-12-19 DIAGNOSIS — Z992 Dependence on renal dialysis: Secondary | ICD-10-CM | POA: Diagnosis not present

## 2021-12-19 DIAGNOSIS — N186 End stage renal disease: Secondary | ICD-10-CM | POA: Diagnosis not present

## 2021-12-19 DIAGNOSIS — N2581 Secondary hyperparathyroidism of renal origin: Secondary | ICD-10-CM | POA: Diagnosis not present

## 2021-12-21 DIAGNOSIS — Z992 Dependence on renal dialysis: Secondary | ICD-10-CM | POA: Diagnosis not present

## 2021-12-21 DIAGNOSIS — N2581 Secondary hyperparathyroidism of renal origin: Secondary | ICD-10-CM | POA: Diagnosis not present

## 2021-12-21 DIAGNOSIS — N186 End stage renal disease: Secondary | ICD-10-CM | POA: Diagnosis not present

## 2021-12-22 DIAGNOSIS — N186 End stage renal disease: Secondary | ICD-10-CM | POA: Diagnosis not present

## 2021-12-22 DIAGNOSIS — Z992 Dependence on renal dialysis: Secondary | ICD-10-CM | POA: Diagnosis not present

## 2021-12-22 DIAGNOSIS — Q612 Polycystic kidney, adult type: Secondary | ICD-10-CM | POA: Diagnosis not present

## 2021-12-23 DIAGNOSIS — Z992 Dependence on renal dialysis: Secondary | ICD-10-CM | POA: Diagnosis not present

## 2021-12-23 DIAGNOSIS — N186 End stage renal disease: Secondary | ICD-10-CM | POA: Diagnosis not present

## 2021-12-23 DIAGNOSIS — N2581 Secondary hyperparathyroidism of renal origin: Secondary | ICD-10-CM | POA: Diagnosis not present

## 2021-12-25 DIAGNOSIS — Q141 Congenital malformation of retina: Secondary | ICD-10-CM | POA: Diagnosis not present

## 2021-12-25 DIAGNOSIS — H5319 Other subjective visual disturbances: Secondary | ICD-10-CM | POA: Diagnosis not present

## 2021-12-25 DIAGNOSIS — Z961 Presence of intraocular lens: Secondary | ICD-10-CM | POA: Diagnosis not present

## 2021-12-25 DIAGNOSIS — H35033 Hypertensive retinopathy, bilateral: Secondary | ICD-10-CM | POA: Diagnosis not present

## 2021-12-26 DIAGNOSIS — N2581 Secondary hyperparathyroidism of renal origin: Secondary | ICD-10-CM | POA: Diagnosis not present

## 2021-12-26 DIAGNOSIS — N186 End stage renal disease: Secondary | ICD-10-CM | POA: Diagnosis not present

## 2021-12-26 DIAGNOSIS — Z992 Dependence on renal dialysis: Secondary | ICD-10-CM | POA: Diagnosis not present

## 2021-12-27 DIAGNOSIS — L309 Dermatitis, unspecified: Secondary | ICD-10-CM | POA: Diagnosis not present

## 2021-12-28 DIAGNOSIS — N186 End stage renal disease: Secondary | ICD-10-CM | POA: Diagnosis not present

## 2021-12-28 DIAGNOSIS — N2581 Secondary hyperparathyroidism of renal origin: Secondary | ICD-10-CM | POA: Diagnosis not present

## 2021-12-28 DIAGNOSIS — Z992 Dependence on renal dialysis: Secondary | ICD-10-CM | POA: Diagnosis not present

## 2021-12-30 DIAGNOSIS — Z992 Dependence on renal dialysis: Secondary | ICD-10-CM | POA: Diagnosis not present

## 2021-12-30 DIAGNOSIS — N2581 Secondary hyperparathyroidism of renal origin: Secondary | ICD-10-CM | POA: Diagnosis not present

## 2021-12-30 DIAGNOSIS — N186 End stage renal disease: Secondary | ICD-10-CM | POA: Diagnosis not present

## 2022-01-02 DIAGNOSIS — N2581 Secondary hyperparathyroidism of renal origin: Secondary | ICD-10-CM | POA: Diagnosis not present

## 2022-01-02 DIAGNOSIS — N186 End stage renal disease: Secondary | ICD-10-CM | POA: Diagnosis not present

## 2022-01-02 DIAGNOSIS — Z992 Dependence on renal dialysis: Secondary | ICD-10-CM | POA: Diagnosis not present

## 2022-01-04 DIAGNOSIS — N2581 Secondary hyperparathyroidism of renal origin: Secondary | ICD-10-CM | POA: Diagnosis not present

## 2022-01-04 DIAGNOSIS — Z992 Dependence on renal dialysis: Secondary | ICD-10-CM | POA: Diagnosis not present

## 2022-01-04 DIAGNOSIS — N186 End stage renal disease: Secondary | ICD-10-CM | POA: Diagnosis not present

## 2022-01-06 DIAGNOSIS — N2581 Secondary hyperparathyroidism of renal origin: Secondary | ICD-10-CM | POA: Diagnosis not present

## 2022-01-06 DIAGNOSIS — N186 End stage renal disease: Secondary | ICD-10-CM | POA: Diagnosis not present

## 2022-01-06 DIAGNOSIS — Z992 Dependence on renal dialysis: Secondary | ICD-10-CM | POA: Diagnosis not present

## 2022-01-09 DIAGNOSIS — N2581 Secondary hyperparathyroidism of renal origin: Secondary | ICD-10-CM | POA: Diagnosis not present

## 2022-01-09 DIAGNOSIS — Z992 Dependence on renal dialysis: Secondary | ICD-10-CM | POA: Diagnosis not present

## 2022-01-09 DIAGNOSIS — N186 End stage renal disease: Secondary | ICD-10-CM | POA: Diagnosis not present

## 2022-01-11 DIAGNOSIS — N186 End stage renal disease: Secondary | ICD-10-CM | POA: Diagnosis not present

## 2022-01-11 DIAGNOSIS — N2581 Secondary hyperparathyroidism of renal origin: Secondary | ICD-10-CM | POA: Diagnosis not present

## 2022-01-11 DIAGNOSIS — Z992 Dependence on renal dialysis: Secondary | ICD-10-CM | POA: Diagnosis not present

## 2022-01-13 DIAGNOSIS — Z992 Dependence on renal dialysis: Secondary | ICD-10-CM | POA: Diagnosis not present

## 2022-01-13 DIAGNOSIS — N2581 Secondary hyperparathyroidism of renal origin: Secondary | ICD-10-CM | POA: Diagnosis not present

## 2022-01-13 DIAGNOSIS — N186 End stage renal disease: Secondary | ICD-10-CM | POA: Diagnosis not present

## 2022-01-15 ENCOUNTER — Encounter (HOSPITAL_COMMUNITY): Payer: Self-pay

## 2022-01-16 DIAGNOSIS — N186 End stage renal disease: Secondary | ICD-10-CM | POA: Diagnosis not present

## 2022-01-16 DIAGNOSIS — Z992 Dependence on renal dialysis: Secondary | ICD-10-CM | POA: Diagnosis not present

## 2022-01-16 DIAGNOSIS — N2581 Secondary hyperparathyroidism of renal origin: Secondary | ICD-10-CM | POA: Diagnosis not present

## 2022-01-18 DIAGNOSIS — N186 End stage renal disease: Secondary | ICD-10-CM | POA: Diagnosis not present

## 2022-01-18 DIAGNOSIS — Z992 Dependence on renal dialysis: Secondary | ICD-10-CM | POA: Diagnosis not present

## 2022-01-18 DIAGNOSIS — N2581 Secondary hyperparathyroidism of renal origin: Secondary | ICD-10-CM | POA: Diagnosis not present

## 2022-01-19 DIAGNOSIS — Z992 Dependence on renal dialysis: Secondary | ICD-10-CM | POA: Diagnosis not present

## 2022-01-19 DIAGNOSIS — I871 Compression of vein: Secondary | ICD-10-CM | POA: Diagnosis not present

## 2022-01-19 DIAGNOSIS — N186 End stage renal disease: Secondary | ICD-10-CM | POA: Diagnosis not present

## 2022-01-20 DIAGNOSIS — Z992 Dependence on renal dialysis: Secondary | ICD-10-CM | POA: Diagnosis not present

## 2022-01-20 DIAGNOSIS — N2581 Secondary hyperparathyroidism of renal origin: Secondary | ICD-10-CM | POA: Diagnosis not present

## 2022-01-20 DIAGNOSIS — N186 End stage renal disease: Secondary | ICD-10-CM | POA: Diagnosis not present

## 2022-01-21 DIAGNOSIS — Q612 Polycystic kidney, adult type: Secondary | ICD-10-CM | POA: Diagnosis not present

## 2022-01-21 DIAGNOSIS — N186 End stage renal disease: Secondary | ICD-10-CM | POA: Diagnosis not present

## 2022-01-21 DIAGNOSIS — Z992 Dependence on renal dialysis: Secondary | ICD-10-CM | POA: Diagnosis not present

## 2022-01-23 DIAGNOSIS — N186 End stage renal disease: Secondary | ICD-10-CM | POA: Diagnosis not present

## 2022-01-23 DIAGNOSIS — N2581 Secondary hyperparathyroidism of renal origin: Secondary | ICD-10-CM | POA: Diagnosis not present

## 2022-01-23 DIAGNOSIS — Z992 Dependence on renal dialysis: Secondary | ICD-10-CM | POA: Diagnosis not present

## 2022-01-25 DIAGNOSIS — N186 End stage renal disease: Secondary | ICD-10-CM | POA: Diagnosis not present

## 2022-01-25 DIAGNOSIS — Z992 Dependence on renal dialysis: Secondary | ICD-10-CM | POA: Diagnosis not present

## 2022-01-25 DIAGNOSIS — N2581 Secondary hyperparathyroidism of renal origin: Secondary | ICD-10-CM | POA: Diagnosis not present

## 2022-01-27 DIAGNOSIS — N2581 Secondary hyperparathyroidism of renal origin: Secondary | ICD-10-CM | POA: Diagnosis not present

## 2022-01-27 DIAGNOSIS — Z992 Dependence on renal dialysis: Secondary | ICD-10-CM | POA: Diagnosis not present

## 2022-01-27 DIAGNOSIS — N186 End stage renal disease: Secondary | ICD-10-CM | POA: Diagnosis not present

## 2022-01-30 DIAGNOSIS — N186 End stage renal disease: Secondary | ICD-10-CM | POA: Diagnosis not present

## 2022-01-30 DIAGNOSIS — N2581 Secondary hyperparathyroidism of renal origin: Secondary | ICD-10-CM | POA: Diagnosis not present

## 2022-01-30 DIAGNOSIS — Z992 Dependence on renal dialysis: Secondary | ICD-10-CM | POA: Diagnosis not present

## 2022-02-01 DIAGNOSIS — Z992 Dependence on renal dialysis: Secondary | ICD-10-CM | POA: Diagnosis not present

## 2022-02-01 DIAGNOSIS — N186 End stage renal disease: Secondary | ICD-10-CM | POA: Diagnosis not present

## 2022-02-01 DIAGNOSIS — N2581 Secondary hyperparathyroidism of renal origin: Secondary | ICD-10-CM | POA: Diagnosis not present

## 2022-02-03 DIAGNOSIS — N186 End stage renal disease: Secondary | ICD-10-CM | POA: Diagnosis not present

## 2022-02-03 DIAGNOSIS — Z992 Dependence on renal dialysis: Secondary | ICD-10-CM | POA: Diagnosis not present

## 2022-02-03 DIAGNOSIS — N2581 Secondary hyperparathyroidism of renal origin: Secondary | ICD-10-CM | POA: Diagnosis not present

## 2022-02-06 ENCOUNTER — Ambulatory Visit (INDEPENDENT_AMBULATORY_CARE_PROVIDER_SITE_OTHER): Payer: Medicare HMO

## 2022-02-06 ENCOUNTER — Ambulatory Visit: Payer: Medicare HMO | Admitting: Podiatry

## 2022-02-06 DIAGNOSIS — M21619 Bunion of unspecified foot: Secondary | ICD-10-CM

## 2022-02-06 DIAGNOSIS — D3610 Benign neoplasm of peripheral nerves and autonomic nervous system, unspecified: Secondary | ICD-10-CM

## 2022-02-06 DIAGNOSIS — Z992 Dependence on renal dialysis: Secondary | ICD-10-CM | POA: Diagnosis not present

## 2022-02-06 DIAGNOSIS — L989 Disorder of the skin and subcutaneous tissue, unspecified: Secondary | ICD-10-CM

## 2022-02-06 DIAGNOSIS — M79671 Pain in right foot: Secondary | ICD-10-CM

## 2022-02-06 DIAGNOSIS — M21611 Bunion of right foot: Secondary | ICD-10-CM

## 2022-02-06 DIAGNOSIS — L600 Ingrowing nail: Secondary | ICD-10-CM

## 2022-02-06 DIAGNOSIS — N2581 Secondary hyperparathyroidism of renal origin: Secondary | ICD-10-CM | POA: Diagnosis not present

## 2022-02-06 DIAGNOSIS — N186 End stage renal disease: Secondary | ICD-10-CM | POA: Diagnosis not present

## 2022-02-06 NOTE — Progress Notes (Signed)
Subjective: ?66 year old male presents the office today for concerns of ingrown toenail left medial nail border as well as for skin lesion on his left foot.  Ingrown toenails been intermittent for some time as well as the skin lesion.  He is also been describing bunion pain on the right foot that sharp pain points along the area of the bunion I get some numbness to the ball of his foot which stays localized.  His symptoms are intermittent.  No recent injuries.  No swelling.  No recent treatment otherwise since the last time.  No other concerns. ? ? ?Objective: ?AAO x3, NAD ?DP/PT pulses palpable bilaterally, CRT less than 3 seconds ?Bunions present bilaterally with overlapping second toe.  On the right foot there is intermittent subjective discomfort along the medial first metatarsal head.  There is no similar pain on exam.  No edema, erythema.  There is some numbness submetatarsal 2 area and along the second interspace.  Small palpable neuroma noted but no significant pain.  No edema, erythema.   ?On the left hallux incurvation present medial nail border the nail is somewhat hypertrophic, dystrophic in the nail border.  There is no drainage or pus.  Hyperkeratotic lesion plantar midfoot without any underlying ulceration drainage or signs of infection.  No foreign body.   ?No pain with calf compression, swelling, warmth, erythema ? ?Assessment: ?66 year old male with right foot bunion, possible neuroma left ingrown toenail, skin lesion ? ?Plan: ?-All treatment options discussed with the patient including all alternatives, risks, complications.  ?-X-rays were obtained reviewed of the right foot.  3 views were obtained.  Moderate bunion deformity is noted.  There is no evidence of acute fracture. ?-Regards to the right foot I dispensed bunion pad as well as metatarsal pads for offloading.  Discussed wearing shoes and good arch support. ?-On the left side discussed partial nail avulsion but ultimately decided to hold  off on this today.  I sharply debrided the symptomatic portion ingrown toenail.  Small mount of bleeding occurred.  Recommend Epsom salt soaks as well as antibiotic ointment dressing changes daily.  Monitor for any signs or symptoms of infection.  If any infection would recur or continued pain will need to have partial nail avulsion he understands this. ?-Debrided the hyperkeratotic lesion left with any complications or bleeding. ? ?Return if symptoms worsen or fail to improve. ? ?Trula Slade DPM ?

## 2022-02-06 NOTE — Patient Instructions (Addendum)
Soak Instructions ? ? ? ?THE DAY AFTER THE PROCEDURE ? ?Place 1/4 cup of epsom salts in a quart of warm tap water.  Submerge your foot or feet with outer bandage intact for the initial soak; this will allow the bandage to become moist and wet for easy lift off.  Once you remove your bandage, continue to soak in the solution for 20 minutes.  This soak should be done twice a day.  Next, remove your foot or feet from solution, blot dry the affected area and cover.  You may use a band aid large enough to cover the area or use gauze and tape.  Apply other medications to the area as directed by the doctor such as polysporin neosporin. ? ?IF YOUR SKIN BECOMES IRRITATED WHILE USING THESE INSTRUCTIONS, IT IS OKAY TO SWITCH TO  WHITE VINEGAR AND WATER. Or you may use antibacterial soap and water to keep the toe clean ? ?Monitor for any signs/symptoms of infection. Call the office immediately if any occur or go directly to the emergency room. Call with any questions/concerns. ? ?Bunion ?A bunion (hallux valgus) is a bump that forms slowly on the inner side of the big toe joint. It occurs when the big toe turns toward the second toe. Bunions may be small at first, but they often get larger over time. They can make walking painful. ?What are the causes? ?This condition may be caused by: ?Wearing narrow or pointed shoes that force the big toe to press against the other toes. ?Abnormal foot development that causes the foot to roll inward. ?Changes in the foot that are caused by certain diseases, such as rheumatoid arthritis or polio. ?A foot injury. ?What increases the risk? ?The following factors may make you more likely to develop this condition: ?Wearing shoes that squeeze the toes together. ?Having certain diseases, such as: ?Rheumatoid arthritis. ?Polio. ?Cerebral palsy. ?Having family members who have bunions. ?Being born with abnormally shaped feet (a foot deformity), such as flat feet or low arches. ?Doing activities that  put a lot of pressure on the feet, such as ballet dancing. ?What are the signs or symptoms? ? ?The main symptom of this condition is a bump on your big toe that you can notice. ?Other symptoms may include: ?Pain. ?Redness and inflammation around your big toe. ?Thick or hardened skin on your big toe or between your toes. ?Stiffness or loss of motion in your big toe. ?Trouble with walking. ?How is this diagnosed? ?This condition may be diagnosed based on your symptoms, medical history, and activities. You may also have tests and imaging, such as: ?X-rays. These allow your health care provider to check the position of the bones in your foot and look for damage to your joint. They also help your health care provider determine the severity of your bunion and the best way to treat it. ?Joint aspiration. In this test, a sample of fluid is removed from the toe joint. This test may be done if you are in a lot of pain. It helps rule out diseases that cause painful swelling of the joints, such as arthritis or gout. ?How is this treated? ?Treatment depends on the severity of your symptoms. The goal of treatment is to relieve symptoms and prevent your bunion from getting worse. Your health care provider may recommend: ?Wearing shoes that have a wide toe box, or using bunion pads to cushion the affected area. ?Taping your toes together to keep them in a normal position. ?Placing a device  inside your shoe (orthotic device) to help reduce pressure on your toe joint. ?Taking medicine to ease pain and inflammation. ?Putting ice or heat on the affected area. ?Doing stretching exercises. ?Surgery, for severe cases. ?Follow these instructions at home: ?Managing pain, stiffness, and swelling ? ?  ? ?If directed, put ice on the painful area. To do this: ?Put ice in a plastic bag. ?Place a towel between your skin and the bag. ?Leave the ice on for 20 minutes, 2-3 times a day. ?Remove the ice if your skin turns bright red. This is very  important. If you cannot feel pain, heat, or cold, you have a greater risk of damage to the area. ?If directed, apply heat to the affected area before you exercise. Use the heat source that your health care provider recommends, such as a moist heat pack or a heating pad. ?Place a towel between your skin and the heat source. ?Leave the heat on for 20-30 minutes. ?Remove the heat if your skin turns bright red. This is especially important if you are unable to feel pain, heat, or cold. You have a greater risk of getting burned. ?General instructions ?Do exercises as told by your health care provider. ?Support your toe joint with proper footwear, shoe padding, or taping as told by your health care provider. ?Take over-the-counter and prescription medicines only as told by your health care provider. ?Do not use any products that contain nicotine or tobacco, such as cigarettes, e-cigarettes, and chewing tobacco. If you need help quitting, ask your health care provider. ?Keep all follow-up visits. This is important. ?Contact a health care provider if: ?Your symptoms get worse. ?Your symptoms do not improve in 2 weeks. ?Get help right away if: ?You have severe pain and trouble with walking. ?Summary ?A bunion is a bump on the inner side of the big toe joint that forms when the big toe turns toward the second toe. ?Bunions can make walking painful. ?Treatment depends on the severity of your symptoms. ?Support your toe joint with proper footwear, shoe padding, or taping as told by your health care provider. ?This information is not intended to replace advice given to you by your health care provider. Make sure you discuss any questions you have with your health care provider. ?Document Revised: 01/15/2020 Document Reviewed: 01/15/2020 ?Elsevier Patient Education ? [place] ? ?An ingrown toenail occurs when the corner or sides of a toenail grow into the surrounding skin. This causes discomfort and  pain. The big toe is most commonly affected, but any of the toes can be affected. If an ingrown toenail is not treated, it can become infected. ?What are the causes? ?This condition may be caused by: ?Wearing shoes that are too small or tight. ?An injury, such as stubbing your toe or having your toe stepped on. ?Improper cutting or care of your toenails. ?Having nail or foot abnormalities that were present from birth (congenital abnormalities), such as having a nail that is too big for your toe. ?What increases the risk? ?The following factors may make you more likely to develop ingrown toenails: ?Age. Nails tend to get thicker with age, so ingrown nails are more common among older people. ?Cutting your toenails incorrectly, such as cutting them very short or cutting them unevenly. ?An ingrown toenail is more likely to get infected if you have: ?Diabetes. ?Blood flow (circulation) problems. ?What are the signs or symptoms? ?Symptoms of an ingrown toenail may include: ?Pain, soreness, or tenderness. ?Redness. ?Swelling. ?  Hardening of the skin that surrounds the toenail. ?Signs that an ingrown toenail may be infected include: ?Fluid or pus. ?Symptoms that get worse. ?How is this diagnosed? ?Ingrown toenails may be diagnosed based on: ?Your symptoms and medical history. ?A physical exam. ?Labs or tests. If you have fluid or blood coming from your toenail, a sample may be collected to test for the specific type of bacteria that is causing the infection. ?How is this treated? ?Treatment depends on the severity of your symptoms. You may be able to care for your toenail at home. ?If you have an infection, you may be prescribed antibiotic medicines. ?If you have fluid or pus draining from your toenail, your health care provider may drain it. ?If you have trouble walking, you may be given crutches to use. ?If you have a severe or infected ingrown toenail, you may need a procedure to remove part or all of the nail. ?Follow  these instructions at home: ?Foot care ? ?Check your wound every day for signs of infection, or as often as told by your health care provider. Check for: ?More redness, swelling, or pain. ?More fluid or blood

## 2022-02-08 DIAGNOSIS — N2581 Secondary hyperparathyroidism of renal origin: Secondary | ICD-10-CM | POA: Diagnosis not present

## 2022-02-08 DIAGNOSIS — N186 End stage renal disease: Secondary | ICD-10-CM | POA: Diagnosis not present

## 2022-02-08 DIAGNOSIS — Z992 Dependence on renal dialysis: Secondary | ICD-10-CM | POA: Diagnosis not present

## 2022-02-10 DIAGNOSIS — Z992 Dependence on renal dialysis: Secondary | ICD-10-CM | POA: Diagnosis not present

## 2022-02-10 DIAGNOSIS — N186 End stage renal disease: Secondary | ICD-10-CM | POA: Diagnosis not present

## 2022-02-10 DIAGNOSIS — N2581 Secondary hyperparathyroidism of renal origin: Secondary | ICD-10-CM | POA: Diagnosis not present

## 2022-02-13 DIAGNOSIS — Z992 Dependence on renal dialysis: Secondary | ICD-10-CM | POA: Diagnosis not present

## 2022-02-13 DIAGNOSIS — N186 End stage renal disease: Secondary | ICD-10-CM | POA: Diagnosis not present

## 2022-02-13 DIAGNOSIS — N2581 Secondary hyperparathyroidism of renal origin: Secondary | ICD-10-CM | POA: Diagnosis not present

## 2022-02-15 DIAGNOSIS — N2581 Secondary hyperparathyroidism of renal origin: Secondary | ICD-10-CM | POA: Diagnosis not present

## 2022-02-15 DIAGNOSIS — Z992 Dependence on renal dialysis: Secondary | ICD-10-CM | POA: Diagnosis not present

## 2022-02-15 DIAGNOSIS — N186 End stage renal disease: Secondary | ICD-10-CM | POA: Diagnosis not present

## 2022-02-17 DIAGNOSIS — Z992 Dependence on renal dialysis: Secondary | ICD-10-CM | POA: Diagnosis not present

## 2022-02-17 DIAGNOSIS — N2581 Secondary hyperparathyroidism of renal origin: Secondary | ICD-10-CM | POA: Diagnosis not present

## 2022-02-17 DIAGNOSIS — N186 End stage renal disease: Secondary | ICD-10-CM | POA: Diagnosis not present

## 2022-02-20 ENCOUNTER — Other Ambulatory Visit: Payer: Self-pay | Admitting: Podiatry

## 2022-02-20 DIAGNOSIS — Z992 Dependence on renal dialysis: Secondary | ICD-10-CM | POA: Diagnosis not present

## 2022-02-20 DIAGNOSIS — M21619 Bunion of unspecified foot: Secondary | ICD-10-CM

## 2022-02-20 DIAGNOSIS — N186 End stage renal disease: Secondary | ICD-10-CM | POA: Diagnosis not present

## 2022-02-20 DIAGNOSIS — U071 COVID-19: Secondary | ICD-10-CM | POA: Diagnosis not present

## 2022-02-20 DIAGNOSIS — N2581 Secondary hyperparathyroidism of renal origin: Secondary | ICD-10-CM | POA: Diagnosis not present

## 2022-02-21 DIAGNOSIS — N186 End stage renal disease: Secondary | ICD-10-CM | POA: Diagnosis not present

## 2022-02-21 DIAGNOSIS — Z992 Dependence on renal dialysis: Secondary | ICD-10-CM | POA: Diagnosis not present

## 2022-02-21 DIAGNOSIS — Q612 Polycystic kidney, adult type: Secondary | ICD-10-CM | POA: Diagnosis not present

## 2022-02-22 DIAGNOSIS — N186 End stage renal disease: Secondary | ICD-10-CM | POA: Diagnosis not present

## 2022-02-22 DIAGNOSIS — Z992 Dependence on renal dialysis: Secondary | ICD-10-CM | POA: Diagnosis not present

## 2022-02-22 DIAGNOSIS — N2581 Secondary hyperparathyroidism of renal origin: Secondary | ICD-10-CM | POA: Diagnosis not present

## 2022-02-24 DIAGNOSIS — N2581 Secondary hyperparathyroidism of renal origin: Secondary | ICD-10-CM | POA: Diagnosis not present

## 2022-02-24 DIAGNOSIS — Z992 Dependence on renal dialysis: Secondary | ICD-10-CM | POA: Diagnosis not present

## 2022-02-24 DIAGNOSIS — N186 End stage renal disease: Secondary | ICD-10-CM | POA: Diagnosis not present

## 2022-02-27 DIAGNOSIS — Z992 Dependence on renal dialysis: Secondary | ICD-10-CM | POA: Diagnosis not present

## 2022-02-27 DIAGNOSIS — N186 End stage renal disease: Secondary | ICD-10-CM | POA: Diagnosis not present

## 2022-02-27 DIAGNOSIS — N2581 Secondary hyperparathyroidism of renal origin: Secondary | ICD-10-CM | POA: Diagnosis not present

## 2022-02-28 ENCOUNTER — Emergency Department (HOSPITAL_BASED_OUTPATIENT_CLINIC_OR_DEPARTMENT_OTHER): Payer: Medicare HMO

## 2022-02-28 ENCOUNTER — Encounter (HOSPITAL_BASED_OUTPATIENT_CLINIC_OR_DEPARTMENT_OTHER): Payer: Self-pay

## 2022-02-28 ENCOUNTER — Emergency Department (HOSPITAL_BASED_OUTPATIENT_CLINIC_OR_DEPARTMENT_OTHER)
Admission: EM | Admit: 2022-02-28 | Discharge: 2022-02-28 | Disposition: A | Discharge status: Home / Self Care | Payer: Medicare HMO | Attending: Emergency Medicine | Admitting: Emergency Medicine

## 2022-02-28 ENCOUNTER — Emergency Department (HOSPITAL_BASED_OUTPATIENT_CLINIC_OR_DEPARTMENT_OTHER): Payer: Medicare HMO | Admitting: Radiology

## 2022-02-28 ENCOUNTER — Other Ambulatory Visit: Payer: Self-pay

## 2022-02-28 DIAGNOSIS — Z79899 Other long term (current) drug therapy: Secondary | ICD-10-CM | POA: Diagnosis not present

## 2022-02-28 DIAGNOSIS — Z7982 Long term (current) use of aspirin: Secondary | ICD-10-CM | POA: Insufficient documentation

## 2022-02-28 DIAGNOSIS — R0602 Shortness of breath: Secondary | ICD-10-CM | POA: Diagnosis not present

## 2022-02-28 DIAGNOSIS — J358 Other chronic diseases of tonsils and adenoids: Secondary | ICD-10-CM | POA: Diagnosis not present

## 2022-02-28 DIAGNOSIS — R001 Bradycardia, unspecified: Secondary | ICD-10-CM | POA: Diagnosis not present

## 2022-02-28 DIAGNOSIS — K1379 Other lesions of oral mucosa: Secondary | ICD-10-CM | POA: Diagnosis not present

## 2022-02-28 LAB — BASIC METABOLIC PANEL
Anion gap: 11 (ref 5–15)
BUN: 57 mg/dL — ABNORMAL HIGH (ref 8–23)
CO2: 31 mmol/L (ref 22–32)
Calcium: 10 mg/dL (ref 8.9–10.3)
Chloride: 99 mmol/L (ref 98–111)
Creatinine, Ser: 8.01 mg/dL — ABNORMAL HIGH (ref 0.61–1.24)
GFR, Estimated: 7 mL/min — ABNORMAL LOW (ref >60)
Glucose, Bld: 99 mg/dL (ref 70–99)
Potassium: 4.8 mmol/L (ref 3.5–5.1)
Sodium: 141 mmol/L (ref 135–145)

## 2022-02-28 LAB — CBC WITH DIFFERENTIAL/PLATELET
Abs Immature Granulocytes: 0.02 10*3/uL (ref 0.00–0.07)
Basophils Absolute: 0 10*3/uL (ref 0.0–0.1)
Basophils Relative: 1 %
Eosinophils Absolute: 0.5 10*3/uL (ref 0.0–0.5)
Eosinophils Relative: 8 %
HCT: 31.3 % — ABNORMAL LOW (ref 39.0–52.0)
Hemoglobin: 10 g/dL — ABNORMAL LOW (ref 13.0–17.0)
Immature Granulocytes: 0 %
Lymphocytes Relative: 27 %
Lymphs Abs: 1.8 10*3/uL (ref 0.7–4.0)
MCH: 29.6 pg (ref 26.0–34.0)
MCHC: 31.9 g/dL (ref 30.0–36.0)
MCV: 92.6 fL (ref 80.0–100.0)
Monocytes Absolute: 0.8 10*3/uL (ref 0.1–1.0)
Monocytes Relative: 12 %
Neutro Abs: 3.4 10*3/uL (ref 1.7–7.7)
Neutrophils Relative %: 52 %
Platelets: 207 10*3/uL (ref 150–400)
RBC: 3.38 MIL/uL — ABNORMAL LOW (ref 4.22–5.81)
RDW: 14.9 % (ref 11.5–15.5)
WBC: 6.5 10*3/uL (ref 4.0–10.5)
nRBC: 0.6 % — ABNORMAL HIGH (ref 0.0–0.2)

## 2022-02-28 LAB — GROUP A STREP BY PCR: Group A Strep by PCR: NOT DETECTED

## 2022-02-28 MED ORDER — ALBUTEROL SULFATE HFA 108 (90 BASE) MCG/ACT IN AERS
2.0000 | INHALATION_SPRAY | RESPIRATORY_TRACT | Status: DC | PRN
Start: 2022-02-28 — End: 2022-02-28

## 2022-02-28 MED ORDER — DEXAMETHASONE SODIUM PHOSPHATE 10 MG/ML IJ SOLN: 10.0000 mg | Freq: Once | INTRAMUSCULAR | Status: DC

## 2022-02-28 MED ORDER — DEXAMETHASONE SODIUM PHOSPHATE 10 MG/ML IJ SOLN
10.0000 mg | Freq: Once | INTRAMUSCULAR | Status: AC
  Administered 2022-02-28: 10 mg via INTRAVENOUS
  Filled 2022-02-28: qty 1

## 2022-02-28 NOTE — Discharge Instructions (Signed)
I suspect that the swelling of your uvula is secondary to trauma from not using your CPAP.  Please use your CPAP every night.  If you develop any worsening trouble swallowing or difficulty breathing, return to the ER immediately.

## 2022-02-28 NOTE — ED Notes (Signed)
Pt lying quietly awake in bed - no distress; pt has been updated on plan for CT neck soft tissue- pt agreeable with plan; denies any immediate needs, questions, concerns at this time.

## 2022-02-28 NOTE — ED Triage Notes (Signed)
Pt reports that he is [facility] and feels that something is stuck in his throat since this afternoon. Pt also reports that he feels that his throat is swollen and that when he tries to swallow things, they "have a hard time going down". Pt ambulatory to triage. NAD noted. Pt tolerating own secretions.

## 2022-02-28 NOTE — ED Provider Notes (Signed)
[facility] EMERGENCY DEPT Provider Note   CSN: [id number] Arrival date & time: 02/28/22  0047     History  Chief Complaint  Patient presents with   Shortness of Breath    Phillip Blair is a 66 y.o. male.  Patient presents to the emergency department for evaluation of throat swelling, difficulty swallowing and difficulty breathing.  Patient has noticed this over the course of today, looked in his throat tonight and saw that his uvula was very swollen.  No history of food allergies.  No other rash or allergic symptoms.  Patient does normally sleep with CPAP, 2 nights ago was traveling and did not use his CPAP.  He is uncertain if he snored more than usual without the CPAP.       Home Medications Prior to Admission medications   Medication Sig Start Date End Date Taking? Authorizing Provider  acetaminophen (TYLENOL) 500 MG tablet Take 500-1,000 mg by mouth every 6 (six) hours as needed for moderate pain or headache.    [provider]  albuterol (VENTOLIN HFA) 108 (90 Base) MCG/ACT inhaler Inhale 2 puffs into the lungs every 6 (six) hours as needed for wheezing or shortness of breath (cough). 06/30/19   Julian Hy, DO  allopurinol (ZYLOPRIM) 100 MG tablet Take 100 mg by mouth daily. 02/22/16   [provider]  amLODipine (NORVASC) 10 MG tablet Take 10 mg by mouth daily.  Patient not taking: Reported on 08/31/2020    [provider]  aspirin EC 81 MG tablet Take 81 mg by mouth daily.    [provider]  b complex-vitamin c-folic acid (NEPHRO-VITE) 0.8 MG TABS tablet Take 1 tablet by mouth daily. 05/30/18   [provider]  ciclopirox (PENLAC) 8 % solution Apply topically at bedtime. Apply over nail and surrounding skin. Apply daily over previous coat. After seven (7) days, may remove with alcohol and continue cycle. 09/13/20   Trula Slade, DPM  clobetasol (OLUX) 0.05 % topical foam Apply topically daily. Apply daily  after shower 09/06/20   Lavonna Monarch, MD  ferric citrate (AURYXIA) 1 GM 210 MG(Fe) tablet Take 420 mg by mouth 3 (three) times daily with meals.     [provider]  fluticasone (FLONASE) 50 MCG/ACT nasal spray Place 1 spray into both nostrils daily as needed for allergies.     [provider]  hydrOXYzine (ATARAX) 25 MG tablet Take 1 tablet (25 mg total) by mouth every 6 (six) hours. 11/20/21   Kommor, Debe Coder, MD  levothyroxine (SYNTHROID) 50 MCG tablet Take 50 mcg by mouth daily before breakfast. 08/03/19   [provider]  nitroGLYCERIN (NITROSTAT) 0.4 MG SL tablet Place 0.4 mg under the tongue every 2 (two) hours as needed for chest pain.  12/30/19   [provider]  omeprazole (PRILOSEC) 20 MG capsule Take 20 mg by mouth daily.     [provider]  Polyethylene Glycol 400 (VISINE DRY EYE RELIEF OP) Place 1 drop into both eyes daily as needed (dry eyes).    [provider]  rosuvastatin (CRESTOR) 5 MG tablet Take 5 mg by mouth every Monday, Wednesday, and Friday.    [provider]      Allergies    Ivp dye [iodinated contrast media], Vancomycin, and Iodine-131    Review of Systems   Review of Systems  Physical Exam Updated Vital Signs BP 137/74   Pulse (!) 48   Temp 97.9 F (36.6 C)  Resp 16   Ht 6' (1.829 m)   Wt 91.2 kg   SpO2 100%   BMI 27.26 kg/m  Physical Exam Vitals and nursing note reviewed.  Constitutional:      General: He is not in acute distress.    Appearance: He is well-developed.  HENT:     Head: Normocephalic and atraumatic.     Mouth/Throat:     Mouth: Mucous membranes are moist.     Pharynx: Uvula swelling present. No oropharyngeal exudate or posterior oropharyngeal erythema.  Eyes:     General: Vision grossly intact. Gaze aligned appropriately.     Extraocular Movements: Extraocular movements intact.     Conjunctiva/sclera: Conjunctivae normal.  Cardiovascular:     Rate and Rhythm: Normal  rate and regular rhythm.     Pulses: Normal pulses.     Heart sounds: Normal heart sounds, S1 normal and S2 normal. No murmur heard.    No friction rub. No gallop.  Pulmonary:     Effort: Pulmonary effort is normal. No respiratory distress.     Breath sounds: Normal breath sounds.  Abdominal:     Palpations: Abdomen is soft.     Tenderness: There is no abdominal tenderness. There is no guarding or rebound.     Hernia: No hernia is present.  Musculoskeletal:        General: No swelling.     Cervical back: Full passive range of motion without pain, normal range of motion and neck supple. No pain with movement, spinous process tenderness or muscular tenderness. Normal range of motion.     Right lower leg: No edema.     Left lower leg: No edema.  Skin:    General: Skin is warm and dry.     Capillary Refill: Capillary refill takes less than 2 seconds.     Findings: No ecchymosis, erythema, lesion or wound.  Neurological:     Mental Status: He is alert and oriented to person, place, and time.     GCS: GCS eye subscore is 4. GCS verbal subscore is 5. GCS motor subscore is 6.     Cranial Nerves: Cranial nerves 2-12 are intact.     Sensory: Sensation is intact.     Motor: Motor function is intact. No weakness or abnormal muscle tone.     Coordination: Coordination is intact.  Psychiatric:        Mood and Affect: Mood normal.        Speech: Speech normal.        Behavior: Behavior normal.     ED Results / Procedures / Treatments   Labs (all labs ordered are listed, but only abnormal results are displayed) Labs Reviewed  CBC WITH DIFFERENTIAL/PLATELET - Abnormal; Notable for the following components:      Result Value   RBC 3.38 (*)    Hemoglobin 10.0 (*)    HCT 31.3 (*)    nRBC 0.6 (*)    All other components within normal limits  BASIC METABOLIC PANEL - Abnormal; Notable for the following components:   BUN 57 (*)    Creatinine, Ser 8.01 (*)    GFR, Estimated 7 (*)    All other  components within normal limits  GROUP A STREP BY PCR    EKG EKG Interpretation  Date/Time:  Wednesday February 28 2022 01:08:54 EDT Ventricular Rate:  58 PR Interval:  210 QRS Duration: 90 QT Interval:  432 QTC Calculation: 424 R Axis:   44 Text Interpretation: Sinus  bradycardia with 1st degree A-V block Otherwise normal ECG When compared with ECG of 20-Nov-2021 10:12, PREVIOUS ECG IS PRESENT Confirmed by Orpah Greek [phone]) on 02/28/2022 2:04:39 AM  Radiology CT Soft Tissue Neck Wo Contrast  Result Date: 02/28/2022 CLINICAL DATA:  Sore throat with epiglottitis or tonsillitis suspected EXAM: CT NECK WITHOUT CONTRAST TECHNIQUE: Multidetector CT imaging of the neck was performed following the standard protocol without intravenous contrast. RADIATION DOSE REDUCTION: This exam was performed according to the departmental dose-optimization program which includes automated exposure control, adjustment of the mA and/or kV according to patient size and/or use of iterative reconstruction technique. COMPARISON:  01/29/2014 FINDINGS: Pharynx and larynx: Extensive palatine tonsilliths, also seen previously. The uvula appears low-density and thickened. No supraglottitis Salivary glands: No inflammation, mass, or stone. Thyroid: Right hemithyroidectomy. Partial coverage of the left lobe. Lymph nodes: None enlarged or abnormal density. Vascular: Mild atheromatous calcification of the carotid bifurcation. Limited intracranial: Negative Visualized orbits: Bilateral cataract resection Mastoids and visualized paranasal sinuses: Partially calcified debris within the dominant left sphenoid sinus with sclerotic wall thickening. Opacified left sphenoid ethmoidal recess. These changes are new from prior. Skeleton: No acute finding Upper chest: Clear apical lungs IMPRESSION: 1. Thickened and edematous appearing uvula.  No supraglottitis. 2. Chronic left sphenoid sinusitis with partially calcified debris, new since  2015. Electronically Signed   By: Jorje Guild M.D.   On: 02/28/2022 04:17   DG Chest 2 View  Result Date: 02/28/2022 CLINICAL DATA:  Shortness of breath EXAM: CHEST - 2 VIEW COMPARISON:  11/20/2021 FINDINGS: The heart size and mediastinal contours are within normal limits. Both lungs are clear. The visualized skeletal structures are unremarkable. IMPRESSION: No active cardiopulmonary disease. Electronically Signed   By: Ulyses Jarred M.D.   On: 02/28/2022 01:58    Procedures Procedures    Medications Ordered in ED Medications  dexamethasone (DECADRON) injection 10 mg (10 mg Intravenous Given 02/28/22 0432)    ED Course/ Medical Decision Making/ A&P                           Medical Decision Making Amount and/or Complexity of Data Reviewed Labs: ordered. Radiology: ordered.  Risk Prescription drug management.   Patient presents to the emergency department for evaluation of going back his throat is swollen.  Examination reveals significant swelling of his uvula.  Differential diagnosis considered includes acute allergic reaction, uvula trauma, oropharyngeal infection.  Strep is negative.  Blood work is normal.  CT scan shows isolated edema of the uvula without other signs of infection.  Patient does use CPAP but did not use it 2 nights ago.  I suspect that he had significant snoring without his CPAP and this caused trauma.  He is not in any airway compromise.  No sign of any other allergic reaction.  We will give a shot of Decadron, no further intervention necessary.  Return precautions given.        Final Clinical Impression(s) / ED Diagnoses Final diagnoses:  Uvular edema    Rx / DC Orders ED Discharge Orders     None         Orpah Greek, MD 03/01/22 2322

## 2022-03-13 DIAGNOSIS — Z992 Dependence on renal dialysis: Secondary | ICD-10-CM | POA: Diagnosis not present

## 2022-03-13 DIAGNOSIS — N2581 Secondary hyperparathyroidism of renal origin: Secondary | ICD-10-CM | POA: Diagnosis not present

## 2022-03-13 DIAGNOSIS — N186 End stage renal disease: Secondary | ICD-10-CM | POA: Diagnosis not present

## 2022-03-15 DIAGNOSIS — N2581 Secondary hyperparathyroidism of renal origin: Secondary | ICD-10-CM | POA: Diagnosis not present

## 2022-03-15 DIAGNOSIS — Z992 Dependence on renal dialysis: Secondary | ICD-10-CM | POA: Diagnosis not present

## 2022-03-15 DIAGNOSIS — N186 End stage renal disease: Secondary | ICD-10-CM | POA: Diagnosis not present

## 2022-03-17 DIAGNOSIS — N2581 Secondary hyperparathyroidism of renal origin: Secondary | ICD-10-CM | POA: Diagnosis not present

## 2022-03-17 DIAGNOSIS — N186 End stage renal disease: Secondary | ICD-10-CM | POA: Diagnosis not present

## 2022-03-17 DIAGNOSIS — Z992 Dependence on renal dialysis: Secondary | ICD-10-CM | POA: Diagnosis not present

## 2022-03-19 ENCOUNTER — Other Ambulatory Visit (HOSPITAL_BASED_OUTPATIENT_CLINIC_OR_DEPARTMENT_OTHER): Payer: Self-pay

## 2022-03-19 ENCOUNTER — Emergency Department (HOSPITAL_BASED_OUTPATIENT_CLINIC_OR_DEPARTMENT_OTHER)
Admission: EM | Admit: 2022-03-19 | Discharge: 2022-03-19 | Disposition: A | Discharge status: Home / Self Care | Payer: Medicare HMO | Attending: Emergency Medicine | Admitting: Emergency Medicine

## 2022-03-19 ENCOUNTER — Other Ambulatory Visit: Payer: Self-pay

## 2022-03-19 ENCOUNTER — Encounter (HOSPITAL_BASED_OUTPATIENT_CLINIC_OR_DEPARTMENT_OTHER): Payer: Self-pay

## 2022-03-19 DIAGNOSIS — Z7982 Long term (current) use of aspirin: Secondary | ICD-10-CM | POA: Insufficient documentation

## 2022-03-19 DIAGNOSIS — N186 End stage renal disease: Secondary | ICD-10-CM | POA: Diagnosis not present

## 2022-03-19 DIAGNOSIS — R070 Pain in throat: Secondary | ICD-10-CM | POA: Diagnosis present

## 2022-03-19 DIAGNOSIS — Z992 Dependence on renal dialysis: Secondary | ICD-10-CM | POA: Insufficient documentation

## 2022-03-19 DIAGNOSIS — K122 Cellulitis and abscess of mouth: Secondary | ICD-10-CM | POA: Diagnosis not present

## 2022-03-19 LAB — GROUP A STREP BY PCR: Group A Strep by PCR: NOT DETECTED

## 2022-03-19 MED ORDER — GUAIFENESIN 100 MG/5ML PO LIQD
10.0000 mL | Freq: Once | ORAL | Status: AC
  Administered 2022-03-19: 10 mL via ORAL
  Filled 2022-03-19: qty 10

## 2022-03-19 MED ORDER — DEXAMETHASONE 1 MG/ML PO CONC: 10.0000 mg | Freq: Once | ORAL | Status: DC

## 2022-03-19 MED ORDER — LIDOCAINE VISCOUS HCL 2 % MT SOLN
15.0000 mL | Freq: Once | OROMUCOSAL | Status: AC
  Administered 2022-03-19: 15 mL via OROMUCOSAL
  Filled 2022-03-19: qty 15

## 2022-03-19 MED ORDER — LIDOCAINE VISCOUS HCL 2 % MT SOLN
15.0000 mL | Freq: Four times a day (QID) | OROMUCOSAL | 0 refills | Status: DC | PRN
  Filled 2022-03-19: qty 100, 2d supply, fill #0

## 2022-03-19 MED ORDER — OXYMETAZOLINE HCL 0.05 % NA SOLN
1.0000 | Freq: Once | NASAL | Status: AC
  Administered 2022-03-19: 1 via NASAL
  Filled 2022-03-19: qty 30

## 2022-03-19 MED ORDER — AMOXICILLIN 500 MG PO CAPS
500.0000 mg | ORAL_CAPSULE | Freq: Once | ORAL | Status: AC
  Administered 2022-03-19: 500 mg via ORAL
  Filled 2022-03-19: qty 1

## 2022-03-19 MED ORDER — DEXAMETHASONE 10 MG/ML FOR PEDIATRIC ORAL USE
10.0000 mg | Freq: Once | INTRAMUSCULAR | Status: AC
  Administered 2022-03-19: 10 mg via ORAL
  Filled 2022-03-19: qty 1

## 2022-03-19 MED ORDER — IBUPROFEN 800 MG PO TABS
800.0000 mg | ORAL_TABLET | Freq: Once | ORAL | Status: AC
  Administered 2022-03-19: 800 mg via ORAL
  Filled 2022-03-19: qty 1

## 2022-03-19 MED ORDER — AMOXICILLIN 500 MG PO CAPS
500.0000 mg | ORAL_CAPSULE | Freq: Two times a day (BID) | ORAL | 0 refills | Status: AC
  Filled 2022-03-19: qty 14, 7d supply, fill #0

## 2022-03-19 NOTE — ED Provider Notes (Signed)
[facility] EMERGENCY DEPT Provider Note   CSN: [id number] Arrival date & time: 03/19/22  0841     History  Chief Complaint  Patient presents with   Sore Throat    Phillip Blair is a 66 y.o. male.  Patient is a 66 year old male with end-stage renal disease on dialysis presenting for complaints of throat pain.  Patient admits to throat pain, changes in voice, nasal congestion, and cough.  Patient denies any chest pain or shortness of breath.  Denies any difficulty swallowing, difficulty tolerating secretions, or drooling.  Denies any fevers, chills, abdominal pain, nausea, vomiting, diarrhea.  The history is provided by the patient. No language interpreter was used.  Sore Throat Pertinent negatives include no chest pain, no abdominal pain and no shortness of breath.       Home Medications Prior to Admission medications   Medication Sig Start Date End Date Taking? Authorizing Provider  amoxicillin (AMOXIL) 500 MG capsule Take 1 capsule (500 mg total) by mouth 2 (two) times daily for 7 days. 03/19/22 03/26/22 Yes Edwin Dada P, DO  acetaminophen (TYLENOL) 500 MG tablet Take 500-1,000 mg by mouth every 6 (six) hours as needed for moderate pain or headache.    [provider]  albuterol (VENTOLIN HFA) 108 (90 Base) MCG/ACT inhaler Inhale 2 puffs into the lungs every 6 (six) hours as needed for wheezing or shortness of breath (cough). 06/30/19   Steffanie Dunn, DO  allopurinol (ZYLOPRIM) 100 MG tablet Take 100 mg by mouth daily. 02/22/16   [provider]  amLODipine (NORVASC) 10 MG tablet Take 10 mg by mouth daily.  Patient not taking: Reported on 08/31/2020    [provider]  aspirin EC 81 MG tablet Take 81 mg by mouth daily.    [provider]  b complex-vitamin c-folic acid (NEPHRO-VITE) 0.8 MG TABS tablet Take 1 tablet by mouth daily. 05/30/18   [provider]  ciclopirox (PENLAC) 8 % solution Apply topically at bedtime.  Apply over nail and surrounding skin. Apply daily over previous coat. After seven (7) days, may remove with alcohol and continue cycle. 09/13/20   Vivi Barrack, DPM  clobetasol (OLUX) 0.05 % topical foam Apply topically daily. Apply daily after shower 09/06/20   Janalyn Harder, MD  ferric citrate (AURYXIA) 1 GM 210 MG(Fe) tablet Take 420 mg by mouth 3 (three) times daily with meals.     [provider]  fluticasone (FLONASE) 50 MCG/ACT nasal spray Place 1 spray into both nostrils daily as needed for allergies.     [provider]  hydrOXYzine (ATARAX) 25 MG tablet Take 1 tablet (25 mg total) by mouth every 6 (six) hours. 11/20/21   Kommor, Wyn Forster, MD  levothyroxine (SYNTHROID) 50 MCG tablet Take 50 mcg by mouth daily before breakfast. 08/03/19   [provider]  nitroGLYCERIN (NITROSTAT) 0.4 MG SL tablet Place 0.4 mg under the tongue every 2 (two) hours as needed for chest pain.  12/30/19   [provider]  omeprazole (PRILOSEC) 20 MG capsule Take 20 mg by mouth daily.     [provider]  Polyethylene Glycol 400 (VISINE DRY EYE RELIEF OP) Place 1 drop into both eyes daily as needed (dry eyes).    [provider]  rosuvastatin (CRESTOR) 5 MG tablet Take 5 mg by mouth every Monday, Wednesday, and Friday.    [provider]      Allergies    Ivp dye [iodinated contrast media], Vancomycin, and  Iodine-131    Review of Systems   Review of Systems  Constitutional:  Negative for chills and fever.  HENT:  Positive for congestion and sore throat. Negative for ear pain.   Eyes:  Negative for pain and visual disturbance.  Respiratory:  Positive for cough. Negative for shortness of breath.   Cardiovascular:  Negative for chest pain and palpitations.  Gastrointestinal:  Negative for abdominal pain and vomiting.  Genitourinary:  Negative for dysuria and hematuria.  Musculoskeletal:  Negative for arthralgias and back pain.  Skin:  Negative  for color change and rash.  Neurological:  Negative for seizures and syncope.  All other systems reviewed and are negative.   Physical Exam Updated Vital Signs BP (!) 152/81 (BP Location: Right Arm)   Pulse 82   Temp 98.6 F (37 C) (Oral)   Resp 17   Ht 6' (1.829 m)   Wt 91.2 kg   SpO2 96%   BMI 27.26 kg/m  Physical Exam Vitals and nursing note reviewed.  Constitutional:      General: He is not in acute distress.    Appearance: He is well-developed.  HENT:     Head: Normocephalic and atraumatic.     Mouth/Throat:     Pharynx: Posterior oropharyngeal erythema and uvula swelling present.     Tonsils: No tonsillar exudate or tonsillar abscesses. 3+ on the right. 3+ on the left.  Eyes:     Conjunctiva/sclera: Conjunctivae normal.  Cardiovascular:     Rate and Rhythm: Normal rate and regular rhythm.     Heart sounds: No murmur heard. Pulmonary:     Effort: Pulmonary effort is normal. No respiratory distress.     Breath sounds: Normal breath sounds.  Abdominal:     Palpations: Abdomen is soft.     Tenderness: There is no abdominal tenderness.  Musculoskeletal:        General: No swelling.     Cervical back: Neck supple.  Skin:    General: Skin is warm and dry.     Capillary Refill: Capillary refill takes less than 2 seconds.  Neurological:     Mental Status: He is alert.  Psychiatric:        Mood and Affect: Mood normal.     ED Results / Procedures / Treatments   Labs (all labs ordered are listed, but only abnormal results are displayed) Labs Reviewed  GROUP A STREP BY PCR    EKG None  Radiology No results found.  Procedures Procedures    Medications Ordered in ED Medications  amoxicillin (AMOXIL) capsule 500 mg (has no administration in time range)  lidocaine (XYLOCAINE) 2 % viscous mouth solution 15 mL (15 mLs Mouth/Throat Given 03/19/22 0948)  ibuprofen (ADVIL) tablet 800 mg (800 mg Oral Given 03/19/22 0951)  guaiFENesin (ROBITUSSIN) 100 MG/5ML  liquid 10 mL (10 mLs Oral Given 03/19/22 0951)  oxymetazoline (AFRIN) 0.05 % nasal spray 1 spray (1 spray Each Nare Given 03/19/22 0948)  dexamethasone (DECADRON) 10 MG/ML injection for Pediatric ORAL use 10 mg (10 mg Oral Given 03/19/22 4098)    ED Course/ Medical Decision Making/ A&P                           Medical Decision Making Risk OTC drugs. Prescription drug management.   36:65 AM  66 year old male with end-stage renal disease on dialysis presenting for complaints of throat pain.  Patient is alert and oriented x3, no acute distress, afebrile,  stable vital signs.  Physical exam demonstrates erythematous oropharynx with tonsillar swelling and uvular swelling.  No peritonsillar abscess. No signs of deep neck infection.  Decadron, viscous lidocaine, Motrin, and strep test ordered.  Patient is otherwise nontoxic-appearing with no signs or symptoms of sepsis.  On re-eval patient has improvement of symptoms. He continues to appear nontoxic. Continuing to protect airway. Stable vitals. Will treat for uvulitis.  Amoxicillin given in ED and sent to pharmancy.   Patient in no distress and overall condition improved here in the ED. Detailed discussions were had with the patient regarding current findings, and need for close f/u with PCP or on call doctor. The patient has been instructed to return immediately if the symptoms worsen in any way for re-evaluation. Patient verbalized understanding and is in agreement with current care plan. All questions answered prior to discharge.         Final Clinical Impression(s) / ED Diagnoses Final diagnoses:  Uvulitis    Rx / DC Orders ED Discharge Orders          Ordered    amoxicillin (AMOXIL) 500 MG capsule  2 times daily        03/19/22 1123              Franne Forts, DO 03/19/22 1126

## 2022-03-19 NOTE — ED Triage Notes (Signed)
"  Sore throat, sneezing, body aches since yesterday" per pt.  Denies chest pain or shortness of breath, denies n/v/d

## 2022-03-20 DIAGNOSIS — Z992 Dependence on renal dialysis: Secondary | ICD-10-CM | POA: Diagnosis not present

## 2022-03-20 DIAGNOSIS — N2581 Secondary hyperparathyroidism of renal origin: Secondary | ICD-10-CM | POA: Diagnosis not present

## 2022-03-20 DIAGNOSIS — N186 End stage renal disease: Secondary | ICD-10-CM | POA: Diagnosis not present

## 2022-03-22 DIAGNOSIS — N2581 Secondary hyperparathyroidism of renal origin: Secondary | ICD-10-CM | POA: Diagnosis not present

## 2022-03-22 DIAGNOSIS — N186 End stage renal disease: Secondary | ICD-10-CM | POA: Diagnosis not present

## 2022-03-22 DIAGNOSIS — Z992 Dependence on renal dialysis: Secondary | ICD-10-CM | POA: Diagnosis not present

## 2022-03-23 DIAGNOSIS — N186 End stage renal disease: Secondary | ICD-10-CM | POA: Diagnosis not present

## 2022-03-23 DIAGNOSIS — Z992 Dependence on renal dialysis: Secondary | ICD-10-CM | POA: Diagnosis not present

## 2022-03-23 DIAGNOSIS — Q612 Polycystic kidney, adult type: Secondary | ICD-10-CM | POA: Diagnosis not present

## 2022-03-24 DIAGNOSIS — Z992 Dependence on renal dialysis: Secondary | ICD-10-CM | POA: Diagnosis not present

## 2022-03-24 DIAGNOSIS — N186 End stage renal disease: Secondary | ICD-10-CM | POA: Diagnosis not present

## 2022-03-24 DIAGNOSIS — N2581 Secondary hyperparathyroidism of renal origin: Secondary | ICD-10-CM | POA: Diagnosis not present

## 2022-03-27 DIAGNOSIS — Z992 Dependence on renal dialysis: Secondary | ICD-10-CM | POA: Diagnosis not present

## 2022-03-27 DIAGNOSIS — N186 End stage renal disease: Secondary | ICD-10-CM | POA: Diagnosis not present

## 2022-03-27 DIAGNOSIS — N2581 Secondary hyperparathyroidism of renal origin: Secondary | ICD-10-CM | POA: Diagnosis not present

## 2022-03-29 DIAGNOSIS — N2581 Secondary hyperparathyroidism of renal origin: Secondary | ICD-10-CM | POA: Diagnosis not present

## 2022-03-29 DIAGNOSIS — N186 End stage renal disease: Secondary | ICD-10-CM | POA: Diagnosis not present

## 2022-03-29 DIAGNOSIS — Z992 Dependence on renal dialysis: Secondary | ICD-10-CM | POA: Diagnosis not present

## 2022-03-31 DIAGNOSIS — N2581 Secondary hyperparathyroidism of renal origin: Secondary | ICD-10-CM | POA: Diagnosis not present

## 2022-03-31 DIAGNOSIS — Z992 Dependence on renal dialysis: Secondary | ICD-10-CM | POA: Diagnosis not present

## 2022-03-31 DIAGNOSIS — N186 End stage renal disease: Secondary | ICD-10-CM | POA: Diagnosis not present

## 2022-04-02 DIAGNOSIS — N186 End stage renal disease: Secondary | ICD-10-CM | POA: Diagnosis not present

## 2022-04-02 DIAGNOSIS — N2581 Secondary hyperparathyroidism of renal origin: Secondary | ICD-10-CM | POA: Diagnosis not present

## 2022-04-02 DIAGNOSIS — Z992 Dependence on renal dialysis: Secondary | ICD-10-CM | POA: Diagnosis not present

## 2022-04-02 IMAGING — DX DG FOOT COMPLETE 3+V*L*
3 series · 3 of 3 positions shown · non-contrast
Comparison: None.

CLINICAL DATA: Left posterior foot pain.  Evaluate for foreign body

EXAM:
LEFT FOOT - COMPLETE 3+ VIEW

[foot ap]
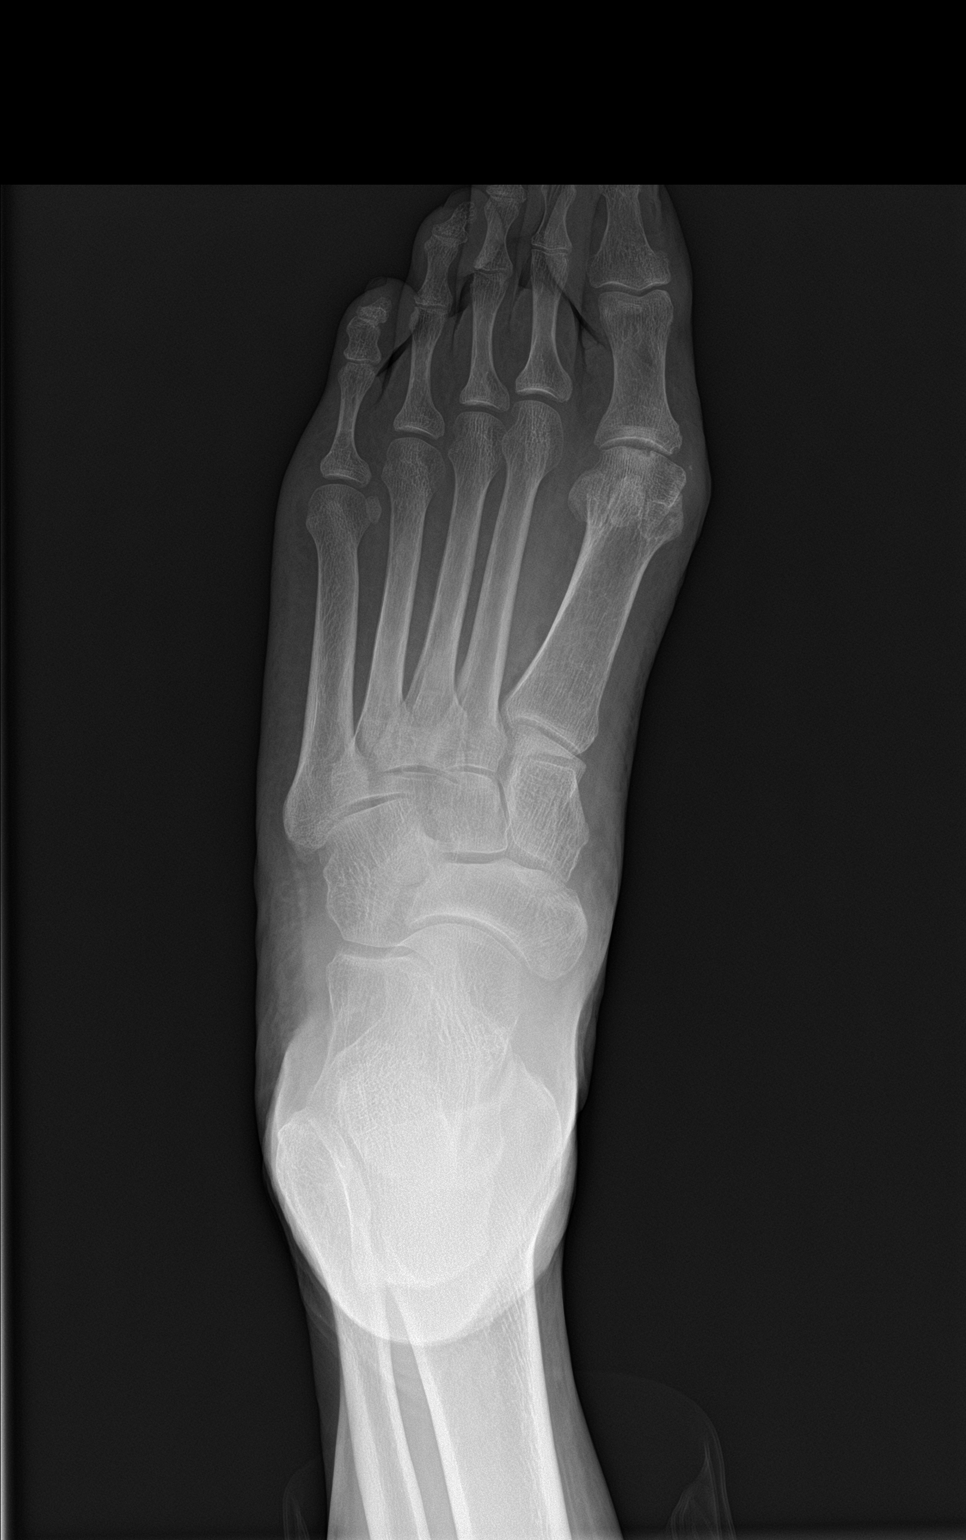

[foot obl]
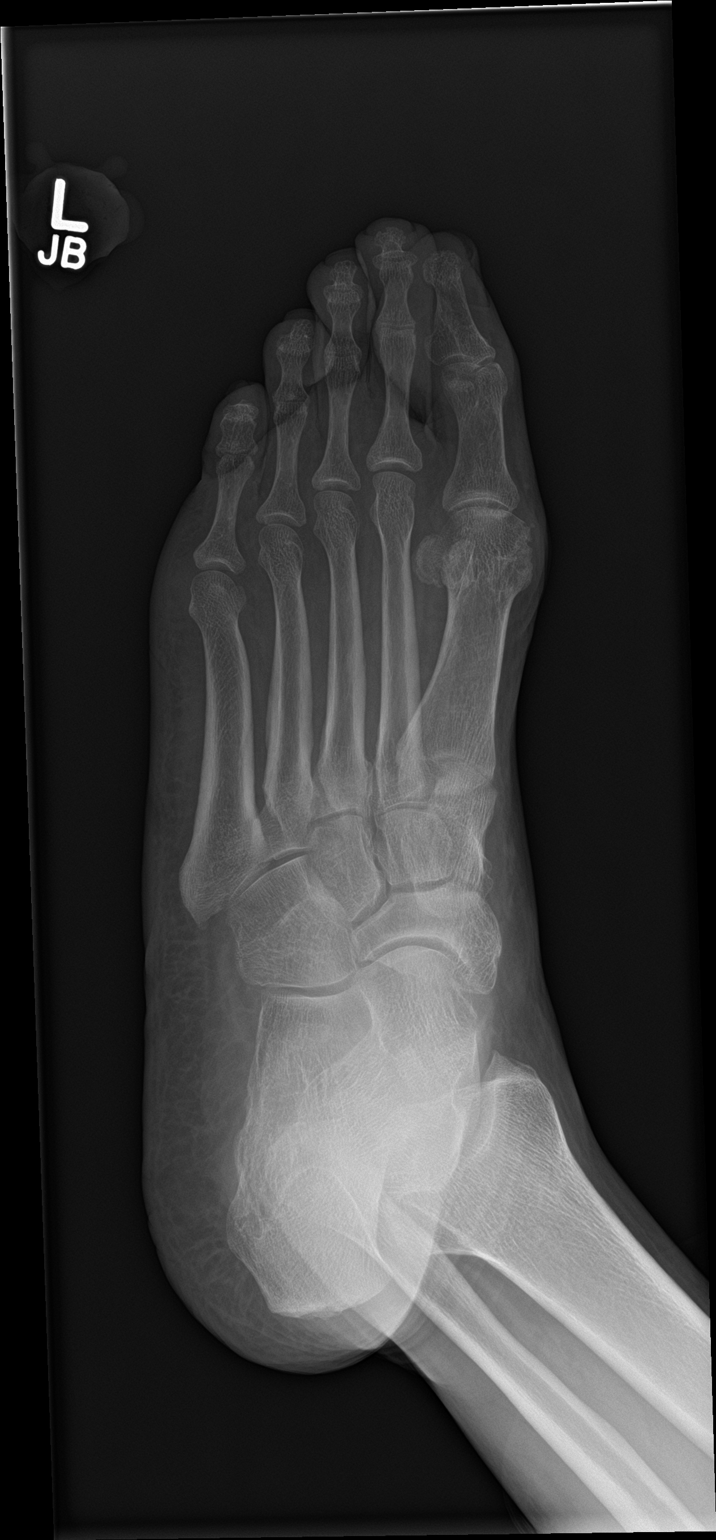

[foot lat]
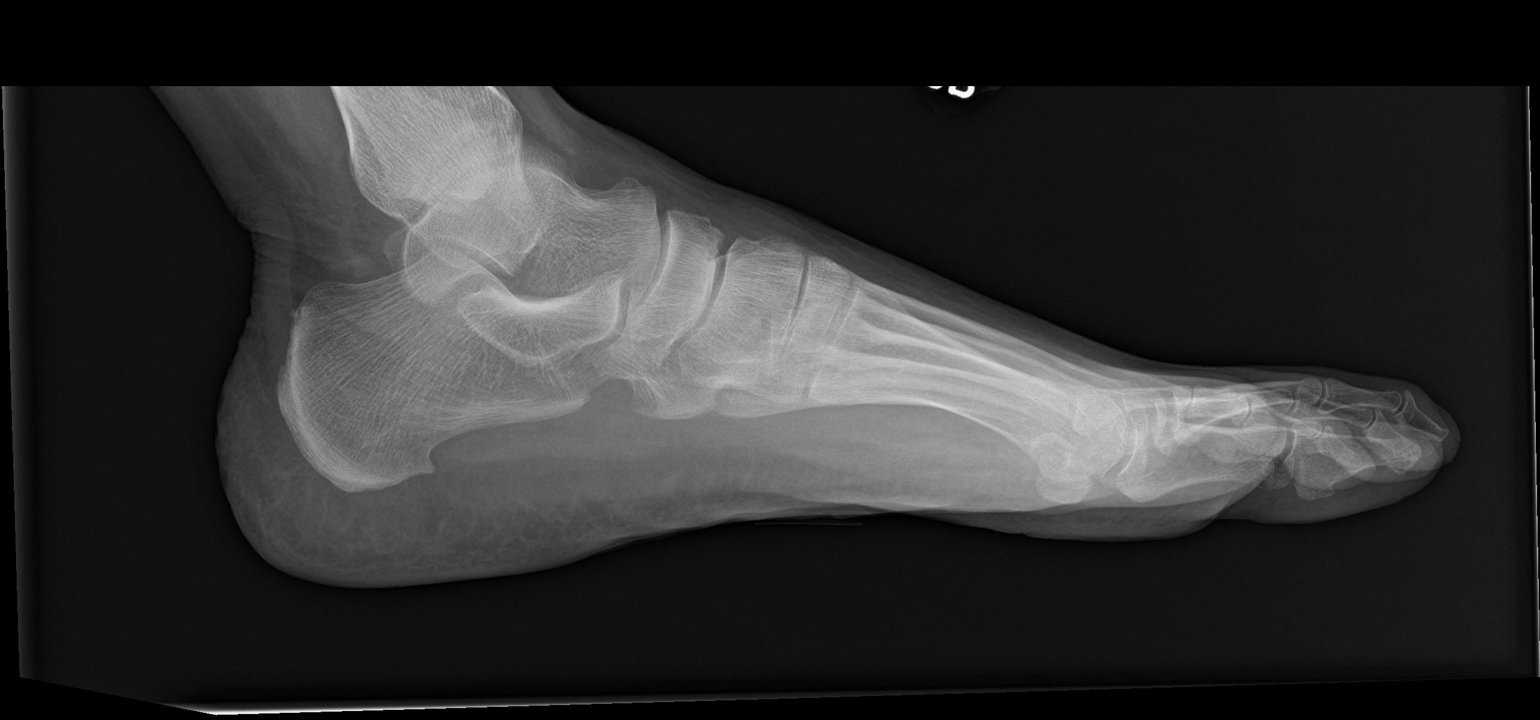

[3 of 3 positions shown; findings below may reference images not displayed]

FINDINGS: Apparent soft tissue swelling about the posterior aspect of the foot
and calcaneus without associated subcutaneous emphysema or
radiopaque foreign body. No fracture or dislocation. No discrete
areas of osteolysis to suggest osteomyelitis.

Mild hallux valgus deformity with degenerative change the first MTP
joint with joint space loss and articular surface irregularity.
Additionally, there is an apparent peripherally sclerotic erosion
involving the medial aspect of the base of the proximal phalanx.
Remaining joint spaces appear preserved. Tiny plantar calcaneal
spur. Minimal enthesopathic change involving the Achilles tendon
insertion site.
IMPRESSION: 1. Apparent soft tissue swelling about the posterior aspect of the
foot and calcaneus without associated fracture or radiopaque foreign
body. Mild hallux valgus deformity with degenerative change of the
first MTP joint with radiographic findings suggestive of a
crystalline/gouty arthropathy. Clinical correlation is advised.

## 2022-04-05 DIAGNOSIS — Z992 Dependence on renal dialysis: Secondary | ICD-10-CM | POA: Diagnosis not present

## 2022-04-05 DIAGNOSIS — N2581 Secondary hyperparathyroidism of renal origin: Secondary | ICD-10-CM | POA: Diagnosis not present

## 2022-04-05 DIAGNOSIS — N186 End stage renal disease: Secondary | ICD-10-CM | POA: Diagnosis not present

## 2022-04-07 DIAGNOSIS — N186 End stage renal disease: Secondary | ICD-10-CM | POA: Diagnosis not present

## 2022-04-07 DIAGNOSIS — Z992 Dependence on renal dialysis: Secondary | ICD-10-CM | POA: Diagnosis not present

## 2022-04-07 DIAGNOSIS — N2581 Secondary hyperparathyroidism of renal origin: Secondary | ICD-10-CM | POA: Diagnosis not present

## 2022-04-10 DIAGNOSIS — N2581 Secondary hyperparathyroidism of renal origin: Secondary | ICD-10-CM | POA: Diagnosis not present

## 2022-04-10 DIAGNOSIS — N186 End stage renal disease: Secondary | ICD-10-CM | POA: Diagnosis not present

## 2022-04-10 DIAGNOSIS — Z992 Dependence on renal dialysis: Secondary | ICD-10-CM | POA: Diagnosis not present

## 2022-04-13 DIAGNOSIS — N186 End stage renal disease: Secondary | ICD-10-CM | POA: Diagnosis not present

## 2022-04-13 DIAGNOSIS — N2581 Secondary hyperparathyroidism of renal origin: Secondary | ICD-10-CM | POA: Diagnosis not present

## 2022-04-13 DIAGNOSIS — Z992 Dependence on renal dialysis: Secondary | ICD-10-CM | POA: Diagnosis not present

## 2022-04-17 DIAGNOSIS — N2581 Secondary hyperparathyroidism of renal origin: Secondary | ICD-10-CM | POA: Diagnosis not present

## 2022-04-17 DIAGNOSIS — N186 End stage renal disease: Secondary | ICD-10-CM | POA: Diagnosis not present

## 2022-04-17 DIAGNOSIS — Z992 Dependence on renal dialysis: Secondary | ICD-10-CM | POA: Diagnosis not present

## 2022-04-19 DIAGNOSIS — N186 End stage renal disease: Secondary | ICD-10-CM | POA: Diagnosis not present

## 2022-04-19 DIAGNOSIS — Z992 Dependence on renal dialysis: Secondary | ICD-10-CM | POA: Diagnosis not present

## 2022-04-19 DIAGNOSIS — N2581 Secondary hyperparathyroidism of renal origin: Secondary | ICD-10-CM | POA: Diagnosis not present

## 2022-04-21 DIAGNOSIS — Z992 Dependence on renal dialysis: Secondary | ICD-10-CM | POA: Diagnosis not present

## 2022-04-21 DIAGNOSIS — N2581 Secondary hyperparathyroidism of renal origin: Secondary | ICD-10-CM | POA: Diagnosis not present

## 2022-04-21 DIAGNOSIS — N186 End stage renal disease: Secondary | ICD-10-CM | POA: Diagnosis not present

## 2022-04-23 DIAGNOSIS — N186 End stage renal disease: Secondary | ICD-10-CM | POA: Diagnosis not present

## 2022-04-23 DIAGNOSIS — Z992 Dependence on renal dialysis: Secondary | ICD-10-CM | POA: Diagnosis not present

## 2022-04-23 DIAGNOSIS — Q612 Polycystic kidney, adult type: Secondary | ICD-10-CM | POA: Diagnosis not present

## 2022-04-24 DIAGNOSIS — Z992 Dependence on renal dialysis: Secondary | ICD-10-CM | POA: Diagnosis not present

## 2022-04-24 DIAGNOSIS — N186 End stage renal disease: Secondary | ICD-10-CM | POA: Diagnosis not present

## 2022-04-24 DIAGNOSIS — N2581 Secondary hyperparathyroidism of renal origin: Secondary | ICD-10-CM | POA: Diagnosis not present

## 2022-04-25 DIAGNOSIS — M79642 Pain in left hand: Secondary | ICD-10-CM | POA: Diagnosis not present

## 2022-04-25 DIAGNOSIS — E7800 Pure hypercholesterolemia, unspecified: Secondary | ICD-10-CM | POA: Diagnosis not present

## 2022-04-25 DIAGNOSIS — E790 Hyperuricemia without signs of inflammatory arthritis and tophaceous disease: Secondary | ICD-10-CM | POA: Diagnosis not present

## 2022-04-25 DIAGNOSIS — N186 End stage renal disease: Secondary | ICD-10-CM | POA: Diagnosis not present

## 2022-04-25 DIAGNOSIS — Z992 Dependence on renal dialysis: Secondary | ICD-10-CM | POA: Diagnosis not present

## 2022-04-25 DIAGNOSIS — E890 Postprocedural hypothyroidism: Secondary | ICD-10-CM | POA: Diagnosis not present

## 2022-04-25 DIAGNOSIS — M79641 Pain in right hand: Secondary | ICD-10-CM | POA: Diagnosis not present

## 2022-04-25 DIAGNOSIS — R7303 Prediabetes: Secondary | ICD-10-CM | POA: Diagnosis not present

## 2022-04-25 DIAGNOSIS — C642 Malignant neoplasm of left kidney, except renal pelvis: Secondary | ICD-10-CM | POA: Diagnosis not present

## 2022-04-26 DIAGNOSIS — Z992 Dependence on renal dialysis: Secondary | ICD-10-CM | POA: Diagnosis not present

## 2022-04-26 DIAGNOSIS — N186 End stage renal disease: Secondary | ICD-10-CM | POA: Diagnosis not present

## 2022-04-26 DIAGNOSIS — N2581 Secondary hyperparathyroidism of renal origin: Secondary | ICD-10-CM | POA: Diagnosis not present

## 2022-04-28 DIAGNOSIS — N2581 Secondary hyperparathyroidism of renal origin: Secondary | ICD-10-CM | POA: Diagnosis not present

## 2022-04-28 DIAGNOSIS — N186 End stage renal disease: Secondary | ICD-10-CM | POA: Diagnosis not present

## 2022-04-28 DIAGNOSIS — Z992 Dependence on renal dialysis: Secondary | ICD-10-CM | POA: Diagnosis not present

## 2022-05-01 DIAGNOSIS — Z992 Dependence on renal dialysis: Secondary | ICD-10-CM | POA: Diagnosis not present

## 2022-05-01 DIAGNOSIS — N2581 Secondary hyperparathyroidism of renal origin: Secondary | ICD-10-CM | POA: Diagnosis not present

## 2022-05-01 DIAGNOSIS — N186 End stage renal disease: Secondary | ICD-10-CM | POA: Diagnosis not present

## 2022-05-03 DIAGNOSIS — N2581 Secondary hyperparathyroidism of renal origin: Secondary | ICD-10-CM | POA: Diagnosis not present

## 2022-05-03 DIAGNOSIS — N186 End stage renal disease: Secondary | ICD-10-CM | POA: Diagnosis not present

## 2022-05-03 DIAGNOSIS — Z992 Dependence on renal dialysis: Secondary | ICD-10-CM | POA: Diagnosis not present

## 2022-05-05 DIAGNOSIS — N2581 Secondary hyperparathyroidism of renal origin: Secondary | ICD-10-CM | POA: Diagnosis not present

## 2022-05-05 DIAGNOSIS — N186 End stage renal disease: Secondary | ICD-10-CM | POA: Diagnosis not present

## 2022-05-05 DIAGNOSIS — Z992 Dependence on renal dialysis: Secondary | ICD-10-CM | POA: Diagnosis not present

## 2022-05-08 DIAGNOSIS — N2581 Secondary hyperparathyroidism of renal origin: Secondary | ICD-10-CM | POA: Diagnosis not present

## 2022-05-08 DIAGNOSIS — Z992 Dependence on renal dialysis: Secondary | ICD-10-CM | POA: Diagnosis not present

## 2022-05-08 DIAGNOSIS — N186 End stage renal disease: Secondary | ICD-10-CM | POA: Diagnosis not present

## 2022-05-10 DIAGNOSIS — Z992 Dependence on renal dialysis: Secondary | ICD-10-CM | POA: Diagnosis not present

## 2022-05-10 DIAGNOSIS — N2581 Secondary hyperparathyroidism of renal origin: Secondary | ICD-10-CM | POA: Diagnosis not present

## 2022-05-10 DIAGNOSIS — N186 End stage renal disease: Secondary | ICD-10-CM | POA: Diagnosis not present

## 2022-05-12 DIAGNOSIS — N186 End stage renal disease: Secondary | ICD-10-CM | POA: Diagnosis not present

## 2022-05-12 DIAGNOSIS — N2581 Secondary hyperparathyroidism of renal origin: Secondary | ICD-10-CM | POA: Diagnosis not present

## 2022-05-12 DIAGNOSIS — Z992 Dependence on renal dialysis: Secondary | ICD-10-CM | POA: Diagnosis not present

## 2022-05-14 DIAGNOSIS — W19XXXA Unspecified fall, initial encounter: Secondary | ICD-10-CM | POA: Diagnosis not present

## 2022-05-14 DIAGNOSIS — S301XXA Contusion of abdominal wall, initial encounter: Secondary | ICD-10-CM | POA: Diagnosis not present

## 2022-05-15 DIAGNOSIS — Z992 Dependence on renal dialysis: Secondary | ICD-10-CM | POA: Diagnosis not present

## 2022-05-15 DIAGNOSIS — N186 End stage renal disease: Secondary | ICD-10-CM | POA: Diagnosis not present

## 2022-05-15 DIAGNOSIS — N2581 Secondary hyperparathyroidism of renal origin: Secondary | ICD-10-CM | POA: Diagnosis not present

## 2022-05-17 DIAGNOSIS — N2581 Secondary hyperparathyroidism of renal origin: Secondary | ICD-10-CM | POA: Diagnosis not present

## 2022-05-17 DIAGNOSIS — N186 End stage renal disease: Secondary | ICD-10-CM | POA: Diagnosis not present

## 2022-05-17 DIAGNOSIS — Z992 Dependence on renal dialysis: Secondary | ICD-10-CM | POA: Diagnosis not present

## 2022-05-19 DIAGNOSIS — N186 End stage renal disease: Secondary | ICD-10-CM | POA: Diagnosis not present

## 2022-05-19 DIAGNOSIS — N2581 Secondary hyperparathyroidism of renal origin: Secondary | ICD-10-CM | POA: Diagnosis not present

## 2022-05-19 DIAGNOSIS — Z992 Dependence on renal dialysis: Secondary | ICD-10-CM | POA: Diagnosis not present

## 2022-05-22 DIAGNOSIS — N2581 Secondary hyperparathyroidism of renal origin: Secondary | ICD-10-CM | POA: Diagnosis not present

## 2022-05-22 DIAGNOSIS — Z992 Dependence on renal dialysis: Secondary | ICD-10-CM | POA: Diagnosis not present

## 2022-05-22 DIAGNOSIS — N186 End stage renal disease: Secondary | ICD-10-CM | POA: Diagnosis not present

## 2022-05-24 DIAGNOSIS — N2581 Secondary hyperparathyroidism of renal origin: Secondary | ICD-10-CM | POA: Diagnosis not present

## 2022-05-24 DIAGNOSIS — Q612 Polycystic kidney, adult type: Secondary | ICD-10-CM | POA: Diagnosis not present

## 2022-05-24 DIAGNOSIS — N186 End stage renal disease: Secondary | ICD-10-CM | POA: Diagnosis not present

## 2022-05-24 DIAGNOSIS — Z992 Dependence on renal dialysis: Secondary | ICD-10-CM | POA: Diagnosis not present

## 2022-05-26 DIAGNOSIS — N186 End stage renal disease: Secondary | ICD-10-CM | POA: Diagnosis not present

## 2022-05-26 DIAGNOSIS — Z992 Dependence on renal dialysis: Secondary | ICD-10-CM | POA: Diagnosis not present

## 2022-05-26 DIAGNOSIS — N2581 Secondary hyperparathyroidism of renal origin: Secondary | ICD-10-CM | POA: Diagnosis not present

## 2022-05-29 DIAGNOSIS — N186 End stage renal disease: Secondary | ICD-10-CM | POA: Diagnosis not present

## 2022-05-29 DIAGNOSIS — N2581 Secondary hyperparathyroidism of renal origin: Secondary | ICD-10-CM | POA: Diagnosis not present

## 2022-05-29 DIAGNOSIS — Z992 Dependence on renal dialysis: Secondary | ICD-10-CM | POA: Diagnosis not present

## 2022-06-01 DIAGNOSIS — N186 End stage renal disease: Secondary | ICD-10-CM | POA: Diagnosis not present

## 2022-06-01 DIAGNOSIS — N2581 Secondary hyperparathyroidism of renal origin: Secondary | ICD-10-CM | POA: Diagnosis not present

## 2022-06-01 DIAGNOSIS — Z992 Dependence on renal dialysis: Secondary | ICD-10-CM | POA: Diagnosis not present

## 2022-06-05 DIAGNOSIS — Z992 Dependence on renal dialysis: Secondary | ICD-10-CM | POA: Diagnosis not present

## 2022-06-05 DIAGNOSIS — N2581 Secondary hyperparathyroidism of renal origin: Secondary | ICD-10-CM | POA: Diagnosis not present

## 2022-06-05 DIAGNOSIS — N186 End stage renal disease: Secondary | ICD-10-CM | POA: Diagnosis not present

## 2022-06-07 DIAGNOSIS — N2581 Secondary hyperparathyroidism of renal origin: Secondary | ICD-10-CM | POA: Diagnosis not present

## 2022-06-07 DIAGNOSIS — N186 End stage renal disease: Secondary | ICD-10-CM | POA: Diagnosis not present

## 2022-06-07 DIAGNOSIS — Z992 Dependence on renal dialysis: Secondary | ICD-10-CM | POA: Diagnosis not present

## 2022-06-09 DIAGNOSIS — Z992 Dependence on renal dialysis: Secondary | ICD-10-CM | POA: Diagnosis not present

## 2022-06-09 DIAGNOSIS — N186 End stage renal disease: Secondary | ICD-10-CM | POA: Diagnosis not present

## 2022-06-09 DIAGNOSIS — N2581 Secondary hyperparathyroidism of renal origin: Secondary | ICD-10-CM | POA: Diagnosis not present

## 2022-06-11 ENCOUNTER — Encounter (HOSPITAL_BASED_OUTPATIENT_CLINIC_OR_DEPARTMENT_OTHER): Payer: Self-pay

## 2022-06-11 ENCOUNTER — Other Ambulatory Visit: Payer: Self-pay

## 2022-06-11 ENCOUNTER — Emergency Department (HOSPITAL_BASED_OUTPATIENT_CLINIC_OR_DEPARTMENT_OTHER): Payer: Medicare HMO | Admitting: Radiology

## 2022-06-11 ENCOUNTER — Emergency Department (HOSPITAL_BASED_OUTPATIENT_CLINIC_OR_DEPARTMENT_OTHER)
Admission: EM | Admit: 2022-06-11 | Discharge: 2022-06-11 | Disposition: A | Discharge status: Home / Self Care | Payer: Medicare HMO | Attending: Emergency Medicine | Admitting: Emergency Medicine

## 2022-06-11 DIAGNOSIS — M79641 Pain in right hand: Secondary | ICD-10-CM | POA: Diagnosis not present

## 2022-06-11 DIAGNOSIS — M10041 Idiopathic gout, right hand: Secondary | ICD-10-CM | POA: Diagnosis not present

## 2022-06-11 DIAGNOSIS — N189 Chronic kidney disease, unspecified: Secondary | ICD-10-CM | POA: Insufficient documentation

## 2022-06-11 DIAGNOSIS — I2510 Atherosclerotic heart disease of native coronary artery without angina pectoris: Secondary | ICD-10-CM | POA: Insufficient documentation

## 2022-06-11 DIAGNOSIS — Z79899 Other long term (current) drug therapy: Secondary | ICD-10-CM | POA: Diagnosis not present

## 2022-06-11 DIAGNOSIS — I10 Essential (primary) hypertension: Secondary | ICD-10-CM | POA: Diagnosis not present

## 2022-06-11 DIAGNOSIS — Z85528 Personal history of other malignant neoplasm of kidney: Secondary | ICD-10-CM | POA: Diagnosis not present

## 2022-06-11 DIAGNOSIS — I129 Hypertensive chronic kidney disease with stage 1 through stage 4 chronic kidney disease, or unspecified chronic kidney disease: Secondary | ICD-10-CM | POA: Insufficient documentation

## 2022-06-11 DIAGNOSIS — M109 Gout, unspecified: Secondary | ICD-10-CM | POA: Diagnosis not present

## 2022-06-11 LAB — BASIC METABOLIC PANEL
Anion gap: 13 (ref 5–15)
BUN: 77 mg/dL — ABNORMAL HIGH (ref 8–23)
CO2: 27 mmol/L (ref 22–32)
Calcium: 9.8 mg/dL (ref 8.9–10.3)
Chloride: 97 mmol/L — ABNORMAL LOW (ref 98–111)
Creatinine, Ser: 11.5 mg/dL — ABNORMAL HIGH (ref 0.61–1.24)
GFR, Estimated: 4 mL/min — ABNORMAL LOW (ref >60)
Glucose, Bld: 94 mg/dL (ref 70–99)
Potassium: 5.9 mmol/L — ABNORMAL HIGH (ref 3.5–5.1)
Sodium: 137 mmol/L (ref 135–145)

## 2022-06-11 LAB — CBC
HCT: 31.5 % — ABNORMAL LOW (ref 39.0–52.0)
Hemoglobin: 9.8 g/dL — ABNORMAL LOW (ref 13.0–17.0)
MCH: 29.3 pg (ref 26.0–34.0)
MCHC: 31.1 g/dL (ref 30.0–36.0)
MCV: 94.3 fL (ref 80.0–100.0)
Platelets: 205 10*3/uL (ref 150–400)
RBC: 3.34 MIL/uL — ABNORMAL LOW (ref 4.22–5.81)
RDW: 17 % — ABNORMAL HIGH (ref 11.5–15.5)
WBC: 6 10*3/uL (ref 4.0–10.5)
nRBC: 0 % (ref 0.0–0.2)

## 2022-06-11 MED ORDER — ACETAMINOPHEN 325 MG PO TABS
650.0000 mg | ORAL_TABLET | Freq: Once | ORAL | Status: AC
  Administered 2022-06-11: 650 mg via ORAL
  Filled 2022-06-11: qty 2

## 2022-06-11 NOTE — ED Notes (Signed)
Discharge paperwork given and verbally understood. 

## 2022-06-11 NOTE — ED Triage Notes (Signed)
Pt presents POV from home, ca/ox4, ambulatory   Pt reports pain and swelling to his Right hand after lifting his Right hand up yesterday. Pt reports it feels like he pulled something. Pt reports the pain originates in the joint above his Right 1st digit, unable to make a fist, unable to brush his teeth or tie his shoes. Pt reports no grip strength. He also endorses some discomfort to his Left hand as well. Pt has a hx of gout/  No Neuro deficits

## 2022-06-11 NOTE — ED Provider Notes (Signed)
[facility] EMERGENCY DEPT Provider Note   CSN: [id number] Arrival date & time: 06/11/22  7035     History  Chief Complaint  Patient presents with   Hand Pain    Phillip Blair is a 66 y.o. male.  HPI   66 year old male presents emergency department with right hand pain.  Patient reports bilateral MCP joint pain of bilateral hands index finger for the past several months.  He notes 1 episode yesterday when he was expressively using his hands forcing extension of all of his digits of his right hand which cause pain along the dorsal aspect of his right index finger.  He said he took Tylenol last night to go to sleep which helped his pain.  Denies weakness or sensory deficits of affected finger.  Patient notes history of gout but states he is on allopurinol and it has affected his lower extremities.  Denies known history of osteoarthritis in his hands.  He states he has not followed with an orthopedic outpatient.  Denies fever, chills, night sweats.  Past medical history significant for CKD on hemodialysis days Saturday/Tuesday/Thursday, CAD, hyperlipidemia, hypertension, hypothyroidism, polycystic kidney disease, renal cancer, allergic rhinitis  Home Medications Prior to Admission medications   Medication Sig Start Date End Date Taking? Authorizing Provider  acetaminophen (TYLENOL) 500 MG tablet Take 500-1,000 mg by mouth every 6 (six) hours as needed for moderate pain or headache.    [provider]  albuterol (VENTOLIN HFA) 108 (90 Base) MCG/ACT inhaler Inhale 2 puffs into the lungs every 6 (six) hours as needed for wheezing or shortness of breath (cough). 06/30/19   Julian Hy, DO  allopurinol (ZYLOPRIM) 100 MG tablet Take 100 mg by mouth daily. 02/22/16   [provider]  amLODipine (NORVASC) 10 MG tablet Take 10 mg by mouth daily.  Patient not taking: Reported on 08/31/2020    [provider]  aspirin EC 81 MG tablet Take 81 mg by mouth  daily.    [provider]  b complex-vitamin c-folic acid (NEPHRO-VITE) 0.8 MG TABS tablet Take 1 tablet by mouth daily. 05/30/18   [provider]  ciclopirox (PENLAC) 8 % solution Apply topically at bedtime. Apply over nail and surrounding skin. Apply daily over previous coat. After seven (7) days, may remove with alcohol and continue cycle. 09/13/20   Trula Slade, DPM  clobetasol (OLUX) 0.05 % topical foam Apply topically daily. Apply daily after shower 09/06/20   Lavonna Monarch, MD  ferric citrate (AURYXIA) 1 GM 210 MG(Fe) tablet Take 420 mg by mouth 3 (three) times daily with meals.     [provider]  fluticasone (FLONASE) 50 MCG/ACT nasal spray Place 1 spray into both nostrils daily as needed for allergies.     [provider]  hydrOXYzine (ATARAX) 25 MG tablet Take 1 tablet (25 mg total) by mouth every 6 (six) hours. 11/20/21   Kommor, Debe Coder, MD  levothyroxine (SYNTHROID) 50 MCG tablet Take 50 mcg by mouth daily before breakfast. 08/03/19   [provider]  lidocaine (XYLOCAINE) 2 % solution Use as directed 15 mLs in the mouth or throat every 6 (six) hours as needed for mouth pain. 0/09/38   Campbell Stall P, DO  nitroGLYCERIN (NITROSTAT) 0.4 MG SL tablet Place 0.4 mg under the tongue every 2 (two) hours as needed for chest pain.  12/30/19   [provider]  omeprazole (PRILOSEC) 20 MG capsule Take 20 mg by mouth daily.     [provider]  Polyethylene Glycol 400 (VISINE DRY EYE RELIEF OP) Place 1 drop into both eyes daily as needed (dry eyes).    [provider]  rosuvastatin (CRESTOR) 5 MG tablet Take 5 mg by mouth every Monday, Wednesday, and Friday.    [provider]      Allergies    Ivp dye [iodinated contrast media], Vancomycin, and Iodine-131    Review of Systems   Review of Systems  All other systems reviewed and are negative.   Physical Exam Updated Vital Signs BP (!) 144/74 (BP Location:  Right Arm)   Pulse (!) 50   Temp 98.6 F (37 C) (Oral)   Resp (!) 9   SpO2 100%  Physical Exam Vitals and nursing note reviewed.  Constitutional:      General: He is not in acute distress.    Appearance: He is well-developed.  HENT:     Head: Normocephalic and atraumatic.  Eyes:     Conjunctiva/sclera: Conjunctivae normal.  Cardiovascular:     Rate and Rhythm: Normal rate and regular rhythm.     Heart sounds: No murmur heard. Pulmonary:     Effort: Pulmonary effort is normal. No respiratory distress.     Breath sounds: Normal breath sounds.  Abdominal:     Palpations: Abdomen is soft.     Tenderness: There is no abdominal tenderness.  Musculoskeletal:        General: No swelling.     Cervical back: Neck supple.     Comments: Patient has full range of motion of bilateral digits of hands.  Patient planing no sensory deficits distallty on affected right index finger.  No observable breaks in skin.  Cap refill less than 2 seconds with radial pulses 2+ bilaterally.  MCP joint of right hand of index finger slightly more noticeably swollen than left.  No erythematic changes in skin.  Patient has tenderness to palpation along extensor tendon of index finger up to mid proximal phalanx just proximal to MCP joint.  Skin:    General: Skin is warm and dry.     Capillary Refill: Capillary refill takes less than 2 seconds.  Neurological:     Mental Status: He is alert.  Psychiatric:        Mood and Affect: Mood normal.     ED Results / Procedures / Treatments   Labs (all labs ordered are listed, but only abnormal results are displayed) Labs Reviewed  BASIC METABOLIC PANEL - Abnormal; Notable for the following components:      Result Value   Potassium 5.9 (*)    Chloride 97 (*)    BUN 77 (*)    Creatinine, Ser 11.50 (*)    GFR, Estimated 4 (*)    All other components within normal limits  CBC - Abnormal; Notable for the following components:   RBC 3.34 (*)    Hemoglobin 9.8 (*)     HCT 31.5 (*)    RDW 17.0 (*)    All other components within normal limits    EKG EKG Interpretation  Date/Time:  Monday June 11 2022 11:45:14 EDT Ventricular Rate:  53 PR Interval:  224 QRS Duration: 92 QT Interval:  426 QTC Calculation: 400 R Axis:   51 Text Interpretation: Sinus rhythm Prolonged PR interval When compared to 02/28/22 no changes. 1st degree AV block present on prior EKG. Confirmed by Margaretmary Eddy [id number]) on 06/11/2022 12:10:01 PM  Radiology DG Hand Complete Right  Result Date: 06/11/2022 CLINICAL DATA:  Pain at base of index finger. No known injury. History of gout. EXAM: RIGHT HAND - COMPLETE 3+ VIEW COMPARISON:  None Available; correlation is made with right foot radiographs 02/06/2022 and left foot radiographs 02/17/2021 FINDINGS: There are moderate lucent erosions seen within the mid to lateral aspect of the distal subchondral second and third metacarpal heads. Some of these demonstrate thin sclerotic borders. Mild triscaphe joint space narrowing and peripheral osteophytosis. Mild radial-scaphoid chondrocalcinosis. No acute fracture or dislocation. IMPRESSION: There are moderate lucent cortical erosions with thin sclerotic borders within the distal, mid to lateral aspect of the second and third metacarpal heads. In this patient with clinical diagnosis of gout and findings of similar bilateral great toe metatarsophalangeal joint erosions on prior radiographs, this is suggestive of the sequela of an inflammatory arthropathy such as gout. Electronically Signed   By: Yvonne Kendall M.D.   On: 06/11/2022 10:52    Procedures Procedures    Medications Ordered in ED Medications  acetaminophen (TYLENOL) tablet 650 mg (650 mg Oral Given 06/11/22 1050)    ED Course/ Medical Decision Making/ A&P                           Medical Decision Making Amount and/or Complexity of Data Reviewed Labs: ordered. Radiology: ordered.  Risk OTC drugs.   This patient  presents to the ED for concern of hand pain, this involves an extensive number of treatment options, and is a complaint that carries with it a high risk of complications and morbidity.  The differential diagnosis includes gout, osteoarthritis, septic arthritis,   Co morbidities that complicate the patient evaluation  See HPI   Additional history obtained:  Additional history obtained from EMR External records from outside source obtained and reviewed including EKG performed on 03/02/2022 indicating similar in appearance with patient's elevated potassium.   Lab Tests:  I Ordered, and personally interpreted labs.  The pertinent results include: Potassium 5.9 which is increased from patient's baseline; discussed with attending Dr. Sharlett Iles regarding the patient and given no EKG findings with dialysis tomorrow, further therapy deemed unnecessary at this point.  Creatinine of 11.5 which is near patient's baseline with GFR of 4 and BUN of 77.  No leukocytosis.  Patient is hemoglobin 9.8 which is at baseline.  No platelet abnormality.   Imaging Studies ordered:  I ordered imaging studies including right hand x-ray I independently visualized and interpreted imaging which showed moderate lucent cortical erosions with thin sclerotic borders within the distal, mid to lateral aspect of the second and third metacarpal heads suggestive of gout. I agree with the radiologist interpretation  Cardiac Monitoring: / EKG:  The patient was maintained on a cardiac monitor.  I personally viewed and interpreted the cardiac monitored which showed an underlying rhythm of: Sinus rhythm with prolongation of PR interval   Consultations Obtained:  Attending physician Dr. Sharlett Iles was consulted regarding this patient given his elevated potassium.  No EKG findings different from prior EKG studies.  Given patient has dialysis tomorrow, further work-up deemed unnecessary at this point.  Problem List / ED Course /  Critical interventions / Medication management  Right hand pain I ordered medication including Tylenol for pain  Reevaluation of the patient after these medicines showed that the patient improved I have reviewed the patients home medicines and have made adjustments as needed   Social Determinants of Health:  Denies tobacco, illicit drug use   Test / Admission - Considered:  Gout Vitals signs significant for hypertension with a blood pressure 144/74.  Recommend close follow-up with PCP regarding evaluation of blood pressure.. Otherwise within normal range and stable throughout visit. Laboratory/imaging studies significant for: See above Patient symptoms likely secondary to gout flare given history as well as imaging findings today.  Joint does not look septic in appearance.  Patient had no real traumatic mechanism of injury but could have gout flare up along with inflammatory extensor tendon process.  Recommend close follow-up with PCP and nephrology regarding potentially increasing patient's allopurinol given recurrent gout flareups over the past 2 to 3 months.  Recommend symptomatic therapy with Tylenol as well as avoiding foods rich in purine's.  Treatment plan was discussed at length with patient he acknowledged understanding was agreeable to said plan. Worrisome signs and symptoms were discussed with the patient, and the patient acknowledged understanding to return to the ED if noticed. Patient was stable upon discharge.          Final Clinical Impression(s) / ED Diagnoses Final diagnoses:  Acute gout of right hand, unspecified cause    Rx / DC Orders ED Discharge Orders     None         Wilnette Kales, [place] 06/11/22 1216    Fransico Meadow, MD 06/14/22 1057

## 2022-06-11 NOTE — Discharge Instructions (Addendum)
Note your work-up today was overall consistent with gout flareup of your right hand.  You can take Tylenol as needed for pain.  It would be worthwhile getting back in with your primary care provider to potentially increase your allopurinol dose given x-ray findings today.  Your symptoms should improve with continued dialysis sessions.  I have included a diet called a low purine diet to help decrease likelihood of recurrent gout flareups.  Your potassium was elevated today while emergency department so make sure to keep your upcoming dialysis appointment tomorrow.  Please not hesitate to return to the emergency department for worrisome signs and symptoms we discussed become apparent.

## 2022-06-12 DIAGNOSIS — N2581 Secondary hyperparathyroidism of renal origin: Secondary | ICD-10-CM | POA: Diagnosis not present

## 2022-06-12 DIAGNOSIS — Z992 Dependence on renal dialysis: Secondary | ICD-10-CM | POA: Diagnosis not present

## 2022-06-12 DIAGNOSIS — N186 End stage renal disease: Secondary | ICD-10-CM | POA: Diagnosis not present

## 2022-06-14 DIAGNOSIS — N186 End stage renal disease: Secondary | ICD-10-CM | POA: Diagnosis not present

## 2022-06-14 DIAGNOSIS — Z992 Dependence on renal dialysis: Secondary | ICD-10-CM | POA: Diagnosis not present

## 2022-06-14 DIAGNOSIS — N2581 Secondary hyperparathyroidism of renal origin: Secondary | ICD-10-CM | POA: Diagnosis not present

## 2022-06-15 ENCOUNTER — Telehealth: Payer: Self-pay

## 2022-06-15 NOTE — Telephone Encounter (Signed)
     Patient  visit on 06/11/2022  at [facility] you been able to follow up with your primary care physician? YES  The patient was or was not able to obtain any needed medicine or equipment. YES  Are there diet recommendations that you are having difficulty following? NA  Patient expresses understanding of discharge instructions and education provided has no other needs at this time. [place], [facility], Care Management  [phone] [address], [place], [place] [address] Phone: [phone] Email: [email or web address]

## 2022-06-16 DIAGNOSIS — Z992 Dependence on renal dialysis: Secondary | ICD-10-CM | POA: Diagnosis not present

## 2022-06-16 DIAGNOSIS — N2581 Secondary hyperparathyroidism of renal origin: Secondary | ICD-10-CM | POA: Diagnosis not present

## 2022-06-16 DIAGNOSIS — N186 End stage renal disease: Secondary | ICD-10-CM | POA: Diagnosis not present

## 2022-06-20 DIAGNOSIS — N186 End stage renal disease: Secondary | ICD-10-CM | POA: Diagnosis not present

## 2022-06-20 DIAGNOSIS — I10 Essential (primary) hypertension: Secondary | ICD-10-CM | POA: Diagnosis not present

## 2022-06-20 DIAGNOSIS — Z992 Dependence on renal dialysis: Secondary | ICD-10-CM | POA: Diagnosis not present

## 2022-06-20 DIAGNOSIS — D631 Anemia in chronic kidney disease: Secondary | ICD-10-CM | POA: Diagnosis not present

## 2022-06-20 DIAGNOSIS — I129 Hypertensive chronic kidney disease with stage 1 through stage 4 chronic kidney disease, or unspecified chronic kidney disease: Secondary | ICD-10-CM | POA: Diagnosis not present

## 2022-06-22 DIAGNOSIS — I10 Essential (primary) hypertension: Secondary | ICD-10-CM | POA: Diagnosis not present

## 2022-06-22 DIAGNOSIS — D631 Anemia in chronic kidney disease: Secondary | ICD-10-CM | POA: Diagnosis not present

## 2022-06-22 DIAGNOSIS — Z992 Dependence on renal dialysis: Secondary | ICD-10-CM | POA: Diagnosis not present

## 2022-06-22 DIAGNOSIS — I129 Hypertensive chronic kidney disease with stage 1 through stage 4 chronic kidney disease, or unspecified chronic kidney disease: Secondary | ICD-10-CM | POA: Diagnosis not present

## 2022-06-22 DIAGNOSIS — N186 End stage renal disease: Secondary | ICD-10-CM | POA: Diagnosis not present

## 2022-06-23 DIAGNOSIS — Z992 Dependence on renal dialysis: Secondary | ICD-10-CM | POA: Diagnosis not present

## 2022-06-23 DIAGNOSIS — Q612 Polycystic kidney, adult type: Secondary | ICD-10-CM | POA: Diagnosis not present

## 2022-06-23 DIAGNOSIS — N186 End stage renal disease: Secondary | ICD-10-CM | POA: Diagnosis not present

## 2022-06-26 DIAGNOSIS — N2581 Secondary hyperparathyroidism of renal origin: Secondary | ICD-10-CM | POA: Diagnosis not present

## 2022-06-26 DIAGNOSIS — Z992 Dependence on renal dialysis: Secondary | ICD-10-CM | POA: Diagnosis not present

## 2022-06-26 DIAGNOSIS — N186 End stage renal disease: Secondary | ICD-10-CM | POA: Diagnosis not present

## 2022-06-28 DIAGNOSIS — N186 End stage renal disease: Secondary | ICD-10-CM | POA: Diagnosis not present

## 2022-06-28 DIAGNOSIS — N2581 Secondary hyperparathyroidism of renal origin: Secondary | ICD-10-CM | POA: Diagnosis not present

## 2022-06-28 DIAGNOSIS — Z992 Dependence on renal dialysis: Secondary | ICD-10-CM | POA: Diagnosis not present

## 2022-06-30 DIAGNOSIS — Z7682 Awaiting organ transplant status: Secondary | ICD-10-CM | POA: Diagnosis not present

## 2022-06-30 DIAGNOSIS — D62 Acute posthemorrhagic anemia: Secondary | ICD-10-CM | POA: Diagnosis not present

## 2022-06-30 DIAGNOSIS — Z940 Kidney transplant status: Secondary | ICD-10-CM | POA: Diagnosis not present

## 2022-06-30 DIAGNOSIS — Z4822 Encounter for aftercare following kidney transplant: Secondary | ICD-10-CM | POA: Diagnosis not present

## 2022-06-30 DIAGNOSIS — I440 Atrioventricular block, first degree: Secondary | ICD-10-CM | POA: Diagnosis not present

## 2022-06-30 DIAGNOSIS — E871 Hypo-osmolality and hyponatremia: Secondary | ICD-10-CM | POA: Diagnosis not present

## 2022-06-30 DIAGNOSIS — Z79899 Other long term (current) drug therapy: Secondary | ICD-10-CM | POA: Diagnosis not present

## 2022-06-30 DIAGNOSIS — D631 Anemia in chronic kidney disease: Secondary | ICD-10-CM | POA: Diagnosis not present

## 2022-06-30 DIAGNOSIS — I2510 Atherosclerotic heart disease of native coronary artery without angina pectoris: Secondary | ICD-10-CM | POA: Diagnosis not present

## 2022-06-30 DIAGNOSIS — E875 Hyperkalemia: Secondary | ICD-10-CM | POA: Diagnosis not present

## 2022-06-30 DIAGNOSIS — N269 Renal sclerosis, unspecified: Secondary | ICD-10-CM | POA: Diagnosis not present

## 2022-06-30 DIAGNOSIS — Z992 Dependence on renal dialysis: Secondary | ICD-10-CM | POA: Diagnosis not present

## 2022-06-30 DIAGNOSIS — N179 Acute kidney failure, unspecified: Secondary | ICD-10-CM | POA: Diagnosis not present

## 2022-06-30 DIAGNOSIS — I2582 Chronic total occlusion of coronary artery: Secondary | ICD-10-CM | POA: Diagnosis not present

## 2022-06-30 DIAGNOSIS — D649 Anemia, unspecified: Secondary | ICD-10-CM | POA: Diagnosis not present

## 2022-06-30 DIAGNOSIS — Z01818 Encounter for other preprocedural examination: Secondary | ICD-10-CM | POA: Diagnosis not present

## 2022-06-30 DIAGNOSIS — D84821 Immunodeficiency due to drugs: Secondary | ICD-10-CM | POA: Diagnosis not present

## 2022-06-30 DIAGNOSIS — R001 Bradycardia, unspecified: Secondary | ICD-10-CM | POA: Diagnosis not present

## 2022-06-30 DIAGNOSIS — D72829 Elevated white blood cell count, unspecified: Secondary | ICD-10-CM | POA: Diagnosis not present

## 2022-06-30 DIAGNOSIS — Z7969 Long term (current) use of other immunomodulators and immunosuppressants: Secondary | ICD-10-CM | POA: Diagnosis not present

## 2022-06-30 DIAGNOSIS — Z7952 Long term (current) use of systemic steroids: Secondary | ICD-10-CM | POA: Diagnosis not present

## 2022-06-30 DIAGNOSIS — Z20822 Contact with and (suspected) exposure to covid-19: Secondary | ICD-10-CM | POA: Diagnosis not present

## 2022-06-30 DIAGNOSIS — N261 Atrophy of kidney (terminal): Secondary | ICD-10-CM | POA: Diagnosis not present

## 2022-06-30 DIAGNOSIS — N186 End stage renal disease: Secondary | ICD-10-CM | POA: Diagnosis not present

## 2022-06-30 DIAGNOSIS — N2581 Secondary hyperparathyroidism of renal origin: Secondary | ICD-10-CM | POA: Diagnosis not present

## 2022-06-30 DIAGNOSIS — I10 Essential (primary) hypertension: Secondary | ICD-10-CM | POA: Diagnosis not present

## 2022-06-30 DIAGNOSIS — Q612 Polycystic kidney, adult type: Secondary | ICD-10-CM | POA: Diagnosis not present

## 2022-06-30 DIAGNOSIS — I120 Hypertensive chronic kidney disease with stage 5 chronic kidney disease or end stage renal disease: Secondary | ICD-10-CM | POA: Diagnosis not present

## 2022-06-30 DIAGNOSIS — I151 Hypertension secondary to other renal disorders: Secondary | ICD-10-CM | POA: Diagnosis not present

## 2022-07-04 DIAGNOSIS — Z940 Kidney transplant status: Secondary | ICD-10-CM | POA: Insufficient documentation

## 2022-07-06 DIAGNOSIS — Z5181 Encounter for therapeutic drug level monitoring: Secondary | ICD-10-CM | POA: Diagnosis not present

## 2022-07-06 DIAGNOSIS — Z4822 Encounter for aftercare following kidney transplant: Secondary | ICD-10-CM | POA: Diagnosis not present

## 2022-07-06 DIAGNOSIS — Z79621 Long term (current) use of calcineurin inhibitor: Secondary | ICD-10-CM | POA: Diagnosis not present

## 2022-07-09 DIAGNOSIS — Z4822 Encounter for aftercare following kidney transplant: Secondary | ICD-10-CM | POA: Diagnosis not present

## 2022-07-09 DIAGNOSIS — Z7952 Long term (current) use of systemic steroids: Secondary | ICD-10-CM | POA: Diagnosis not present

## 2022-07-09 DIAGNOSIS — Z79624 Long term (current) use of inhibitors of nucleotide synthesis: Secondary | ICD-10-CM | POA: Diagnosis not present

## 2022-07-09 DIAGNOSIS — D84821 Immunodeficiency due to drugs: Secondary | ICD-10-CM | POA: Diagnosis not present

## 2022-07-09 DIAGNOSIS — I10 Essential (primary) hypertension: Secondary | ICD-10-CM | POA: Diagnosis not present

## 2022-07-09 DIAGNOSIS — Z79621 Long term (current) use of calcineurin inhibitor: Secondary | ICD-10-CM | POA: Diagnosis not present

## 2022-07-09 DIAGNOSIS — Z951 Presence of aortocoronary bypass graft: Secondary | ICD-10-CM | POA: Diagnosis not present

## 2022-07-09 DIAGNOSIS — E785 Hyperlipidemia, unspecified: Secondary | ICD-10-CM | POA: Diagnosis not present

## 2022-07-09 DIAGNOSIS — I2510 Atherosclerotic heart disease of native coronary artery without angina pectoris: Secondary | ICD-10-CM | POA: Diagnosis not present

## 2022-07-09 DIAGNOSIS — D649 Anemia, unspecified: Secondary | ICD-10-CM | POA: Diagnosis not present

## 2022-07-09 DIAGNOSIS — Z905 Acquired absence of kidney: Secondary | ICD-10-CM | POA: Diagnosis not present

## 2022-07-12 DIAGNOSIS — Z87442 Personal history of urinary calculi: Secondary | ICD-10-CM | POA: Diagnosis not present

## 2022-07-12 DIAGNOSIS — E869 Volume depletion, unspecified: Secondary | ICD-10-CM | POA: Diagnosis not present

## 2022-07-12 DIAGNOSIS — D84821 Immunodeficiency due to drugs: Secondary | ICD-10-CM | POA: Diagnosis not present

## 2022-07-12 DIAGNOSIS — I10 Essential (primary) hypertension: Secondary | ICD-10-CM | POA: Diagnosis not present

## 2022-07-12 DIAGNOSIS — Z940 Kidney transplant status: Secondary | ICD-10-CM | POA: Diagnosis not present

## 2022-07-12 DIAGNOSIS — Z79899 Other long term (current) drug therapy: Secondary | ICD-10-CM | POA: Diagnosis not present

## 2022-07-12 DIAGNOSIS — Z79624 Long term (current) use of inhibitors of nucleotide synthesis: Secondary | ICD-10-CM | POA: Diagnosis not present

## 2022-07-12 DIAGNOSIS — Q613 Polycystic kidney, unspecified: Secondary | ICD-10-CM | POA: Diagnosis not present

## 2022-07-12 DIAGNOSIS — Z7952 Long term (current) use of systemic steroids: Secondary | ICD-10-CM | POA: Diagnosis not present

## 2022-07-12 DIAGNOSIS — Z4822 Encounter for aftercare following kidney transplant: Secondary | ICD-10-CM | POA: Diagnosis not present

## 2022-07-12 DIAGNOSIS — E785 Hyperlipidemia, unspecified: Secondary | ICD-10-CM | POA: Diagnosis not present

## 2022-07-12 DIAGNOSIS — D649 Anemia, unspecified: Secondary | ICD-10-CM | POA: Diagnosis not present

## 2022-07-16 DIAGNOSIS — T8619 Other complication of kidney transplant: Secondary | ICD-10-CM | POA: Diagnosis not present

## 2022-07-16 DIAGNOSIS — Z4822 Encounter for aftercare following kidney transplant: Secondary | ICD-10-CM | POA: Diagnosis not present

## 2022-07-16 DIAGNOSIS — Z7952 Long term (current) use of systemic steroids: Secondary | ICD-10-CM | POA: Diagnosis not present

## 2022-07-16 DIAGNOSIS — Z792 Long term (current) use of antibiotics: Secondary | ICD-10-CM | POA: Diagnosis not present

## 2022-07-16 DIAGNOSIS — N186 End stage renal disease: Secondary | ICD-10-CM | POA: Diagnosis not present

## 2022-07-16 DIAGNOSIS — D849 Immunodeficiency, unspecified: Secondary | ICD-10-CM | POA: Diagnosis not present

## 2022-07-16 DIAGNOSIS — E875 Hyperkalemia: Secondary | ICD-10-CM | POA: Diagnosis not present

## 2022-07-16 DIAGNOSIS — R7303 Prediabetes: Secondary | ICD-10-CM | POA: Diagnosis not present

## 2022-07-16 DIAGNOSIS — Z992 Dependence on renal dialysis: Secondary | ICD-10-CM | POA: Diagnosis not present

## 2022-07-16 DIAGNOSIS — I440 Atrioventricular block, first degree: Secondary | ICD-10-CM | POA: Diagnosis not present

## 2022-07-16 DIAGNOSIS — I2510 Atherosclerotic heart disease of native coronary artery without angina pectoris: Secondary | ICD-10-CM | POA: Diagnosis not present

## 2022-07-16 DIAGNOSIS — Z940 Kidney transplant status: Secondary | ICD-10-CM | POA: Diagnosis not present

## 2022-07-16 DIAGNOSIS — Z79621 Long term (current) use of calcineurin inhibitor: Secondary | ICD-10-CM | POA: Diagnosis not present

## 2022-07-16 DIAGNOSIS — I120 Hypertensive chronic kidney disease with stage 5 chronic kidney disease or end stage renal disease: Secondary | ICD-10-CM | POA: Diagnosis not present

## 2022-07-16 DIAGNOSIS — D631 Anemia in chronic kidney disease: Secondary | ICD-10-CM | POA: Diagnosis not present

## 2022-07-16 DIAGNOSIS — I10 Essential (primary) hypertension: Secondary | ICD-10-CM | POA: Diagnosis not present

## 2022-07-19 DIAGNOSIS — R1031 Right lower quadrant pain: Secondary | ICD-10-CM | POA: Diagnosis not present

## 2022-07-19 DIAGNOSIS — I10 Essential (primary) hypertension: Secondary | ICD-10-CM | POA: Diagnosis not present

## 2022-07-19 DIAGNOSIS — D649 Anemia, unspecified: Secondary | ICD-10-CM | POA: Diagnosis not present

## 2022-07-19 DIAGNOSIS — Z5181 Encounter for therapeutic drug level monitoring: Secondary | ICD-10-CM | POA: Diagnosis not present

## 2022-07-19 DIAGNOSIS — Z85528 Personal history of other malignant neoplasm of kidney: Secondary | ICD-10-CM | POA: Diagnosis not present

## 2022-07-19 DIAGNOSIS — I151 Hypertension secondary to other renal disorders: Secondary | ICD-10-CM | POA: Diagnosis not present

## 2022-07-19 DIAGNOSIS — Z905 Acquired absence of kidney: Secondary | ICD-10-CM | POA: Diagnosis not present

## 2022-07-19 DIAGNOSIS — Z955 Presence of coronary angioplasty implant and graft: Secondary | ICD-10-CM | POA: Diagnosis not present

## 2022-07-19 DIAGNOSIS — Z940 Kidney transplant status: Secondary | ICD-10-CM | POA: Diagnosis not present

## 2022-07-19 DIAGNOSIS — Z79899 Other long term (current) drug therapy: Secondary | ICD-10-CM | POA: Diagnosis not present

## 2022-07-19 DIAGNOSIS — I2510 Atherosclerotic heart disease of native coronary artery without angina pectoris: Secondary | ICD-10-CM | POA: Diagnosis not present

## 2022-07-19 DIAGNOSIS — Z4822 Encounter for aftercare following kidney transplant: Secondary | ICD-10-CM | POA: Diagnosis not present

## 2022-07-19 DIAGNOSIS — Z79621 Long term (current) use of calcineurin inhibitor: Secondary | ICD-10-CM | POA: Diagnosis not present

## 2022-07-19 DIAGNOSIS — R109 Unspecified abdominal pain: Secondary | ICD-10-CM | POA: Diagnosis not present

## 2022-07-19 DIAGNOSIS — E875 Hyperkalemia: Secondary | ICD-10-CM | POA: Diagnosis not present

## 2022-07-23 DIAGNOSIS — E785 Hyperlipidemia, unspecified: Secondary | ICD-10-CM | POA: Diagnosis not present

## 2022-07-23 DIAGNOSIS — E875 Hyperkalemia: Secondary | ICD-10-CM | POA: Diagnosis not present

## 2022-07-23 DIAGNOSIS — R1031 Right lower quadrant pain: Secondary | ICD-10-CM | POA: Diagnosis not present

## 2022-07-23 DIAGNOSIS — D649 Anemia, unspecified: Secondary | ICD-10-CM | POA: Diagnosis not present

## 2022-07-23 DIAGNOSIS — Z940 Kidney transplant status: Secondary | ICD-10-CM | POA: Diagnosis not present

## 2022-07-23 DIAGNOSIS — Z4822 Encounter for aftercare following kidney transplant: Secondary | ICD-10-CM | POA: Diagnosis not present

## 2022-07-23 DIAGNOSIS — Z79899 Other long term (current) drug therapy: Secondary | ICD-10-CM | POA: Diagnosis not present

## 2022-07-23 DIAGNOSIS — I10 Essential (primary) hypertension: Secondary | ICD-10-CM | POA: Diagnosis not present

## 2022-07-23 DIAGNOSIS — D849 Immunodeficiency, unspecified: Secondary | ICD-10-CM | POA: Diagnosis not present

## 2022-07-23 DIAGNOSIS — Z955 Presence of coronary angioplasty implant and graft: Secondary | ICD-10-CM | POA: Diagnosis not present

## 2022-07-23 DIAGNOSIS — I2510 Atherosclerotic heart disease of native coronary artery without angina pectoris: Secondary | ICD-10-CM | POA: Diagnosis not present

## 2022-07-24 DIAGNOSIS — Z992 Dependence on renal dialysis: Secondary | ICD-10-CM | POA: Diagnosis not present

## 2022-07-24 DIAGNOSIS — N186 End stage renal disease: Secondary | ICD-10-CM | POA: Diagnosis not present

## 2022-07-24 DIAGNOSIS — Q612 Polycystic kidney, adult type: Secondary | ICD-10-CM | POA: Diagnosis not present

## 2022-07-26 DIAGNOSIS — I10 Essential (primary) hypertension: Secondary | ICD-10-CM | POA: Diagnosis not present

## 2022-07-26 DIAGNOSIS — D849 Immunodeficiency, unspecified: Secondary | ICD-10-CM | POA: Diagnosis not present

## 2022-07-26 DIAGNOSIS — E875 Hyperkalemia: Secondary | ICD-10-CM | POA: Diagnosis not present

## 2022-07-26 DIAGNOSIS — Z5181 Encounter for therapeutic drug level monitoring: Secondary | ICD-10-CM | POA: Diagnosis not present

## 2022-07-26 DIAGNOSIS — Z79899 Other long term (current) drug therapy: Secondary | ICD-10-CM | POA: Diagnosis not present

## 2022-07-26 DIAGNOSIS — Z7952 Long term (current) use of systemic steroids: Secondary | ICD-10-CM | POA: Diagnosis not present

## 2022-07-26 DIAGNOSIS — Z940 Kidney transplant status: Secondary | ICD-10-CM | POA: Diagnosis not present

## 2022-07-26 DIAGNOSIS — Z79624 Long term (current) use of inhibitors of nucleotide synthesis: Secondary | ICD-10-CM | POA: Diagnosis not present

## 2022-07-26 DIAGNOSIS — Z79621 Long term (current) use of calcineurin inhibitor: Secondary | ICD-10-CM | POA: Diagnosis not present

## 2022-07-26 DIAGNOSIS — Z4822 Encounter for aftercare following kidney transplant: Secondary | ICD-10-CM | POA: Diagnosis not present

## 2022-07-26 DIAGNOSIS — R1031 Right lower quadrant pain: Secondary | ICD-10-CM | POA: Diagnosis not present

## 2022-07-26 DIAGNOSIS — Z792 Long term (current) use of antibiotics: Secondary | ICD-10-CM | POA: Diagnosis not present

## 2022-07-31 DIAGNOSIS — Z905 Acquired absence of kidney: Secondary | ICD-10-CM | POA: Diagnosis not present

## 2022-07-31 DIAGNOSIS — D649 Anemia, unspecified: Secondary | ICD-10-CM | POA: Diagnosis not present

## 2022-07-31 DIAGNOSIS — R7301 Impaired fasting glucose: Secondary | ICD-10-CM | POA: Diagnosis not present

## 2022-07-31 DIAGNOSIS — D849 Immunodeficiency, unspecified: Secondary | ICD-10-CM | POA: Diagnosis not present

## 2022-07-31 DIAGNOSIS — R739 Hyperglycemia, unspecified: Secondary | ICD-10-CM | POA: Diagnosis not present

## 2022-07-31 DIAGNOSIS — Z79621 Long term (current) use of calcineurin inhibitor: Secondary | ICD-10-CM | POA: Diagnosis not present

## 2022-07-31 DIAGNOSIS — T8619 Other complication of kidney transplant: Secondary | ICD-10-CM | POA: Diagnosis not present

## 2022-07-31 DIAGNOSIS — I2510 Atherosclerotic heart disease of native coronary artery without angina pectoris: Secondary | ICD-10-CM | POA: Diagnosis not present

## 2022-07-31 DIAGNOSIS — E875 Hyperkalemia: Secondary | ICD-10-CM | POA: Diagnosis not present

## 2022-07-31 DIAGNOSIS — Z4822 Encounter for aftercare following kidney transplant: Secondary | ICD-10-CM | POA: Diagnosis not present

## 2022-07-31 DIAGNOSIS — Z5181 Encounter for therapeutic drug level monitoring: Secondary | ICD-10-CM | POA: Diagnosis not present

## 2022-07-31 DIAGNOSIS — E785 Hyperlipidemia, unspecified: Secondary | ICD-10-CM | POA: Diagnosis not present

## 2022-07-31 DIAGNOSIS — Z940 Kidney transplant status: Secondary | ICD-10-CM | POA: Diagnosis not present

## 2022-07-31 DIAGNOSIS — H5789 Other specified disorders of eye and adnexa: Secondary | ICD-10-CM | POA: Diagnosis not present

## 2022-07-31 DIAGNOSIS — Z09 Encounter for follow-up examination after completed treatment for conditions other than malignant neoplasm: Secondary | ICD-10-CM | POA: Diagnosis not present

## 2022-07-31 DIAGNOSIS — I10 Essential (primary) hypertension: Secondary | ICD-10-CM | POA: Diagnosis not present

## 2022-08-02 DIAGNOSIS — N2889 Other specified disorders of kidney and ureter: Secondary | ICD-10-CM | POA: Diagnosis not present

## 2022-08-02 DIAGNOSIS — Z940 Kidney transplant status: Secondary | ICD-10-CM | POA: Diagnosis not present

## 2022-08-02 DIAGNOSIS — Z4822 Encounter for aftercare following kidney transplant: Secondary | ICD-10-CM | POA: Diagnosis not present

## 2022-08-02 DIAGNOSIS — N179 Acute kidney failure, unspecified: Secondary | ICD-10-CM | POA: Diagnosis not present

## 2022-08-06 DIAGNOSIS — Z79624 Long term (current) use of inhibitors of nucleotide synthesis: Secondary | ICD-10-CM | POA: Diagnosis not present

## 2022-08-06 DIAGNOSIS — E875 Hyperkalemia: Secondary | ICD-10-CM | POA: Diagnosis not present

## 2022-08-06 DIAGNOSIS — Z940 Kidney transplant status: Secondary | ICD-10-CM | POA: Diagnosis not present

## 2022-08-06 DIAGNOSIS — D649 Anemia, unspecified: Secondary | ICD-10-CM | POA: Diagnosis not present

## 2022-08-06 DIAGNOSIS — Z79621 Long term (current) use of calcineurin inhibitor: Secondary | ICD-10-CM | POA: Diagnosis not present

## 2022-08-06 DIAGNOSIS — I10 Essential (primary) hypertension: Secondary | ICD-10-CM | POA: Diagnosis not present

## 2022-08-06 DIAGNOSIS — Z955 Presence of coronary angioplasty implant and graft: Secondary | ICD-10-CM | POA: Diagnosis not present

## 2022-08-06 DIAGNOSIS — Z79899 Other long term (current) drug therapy: Secondary | ICD-10-CM | POA: Diagnosis not present

## 2022-08-06 DIAGNOSIS — I2510 Atherosclerotic heart disease of native coronary artery without angina pectoris: Secondary | ICD-10-CM | POA: Diagnosis not present

## 2022-08-06 DIAGNOSIS — Z4822 Encounter for aftercare following kidney transplant: Secondary | ICD-10-CM | POA: Diagnosis not present

## 2022-08-06 DIAGNOSIS — D849 Immunodeficiency, unspecified: Secondary | ICD-10-CM | POA: Diagnosis not present

## 2022-08-06 DIAGNOSIS — R7303 Prediabetes: Secondary | ICD-10-CM | POA: Diagnosis not present

## 2022-08-14 DIAGNOSIS — Z0100 Encounter for examination of eyes and vision without abnormal findings: Secondary | ICD-10-CM | POA: Diagnosis not present

## 2022-08-14 DIAGNOSIS — D849 Immunodeficiency, unspecified: Secondary | ICD-10-CM | POA: Diagnosis not present

## 2022-08-14 DIAGNOSIS — D3131 Benign neoplasm of right choroid: Secondary | ICD-10-CM | POA: Diagnosis not present

## 2022-08-14 DIAGNOSIS — E875 Hyperkalemia: Secondary | ICD-10-CM | POA: Diagnosis not present

## 2022-08-14 DIAGNOSIS — Z79621 Long term (current) use of calcineurin inhibitor: Secondary | ICD-10-CM | POA: Diagnosis not present

## 2022-08-14 DIAGNOSIS — R739 Hyperglycemia, unspecified: Secondary | ICD-10-CM | POA: Diagnosis not present

## 2022-08-14 DIAGNOSIS — Z4822 Encounter for aftercare following kidney transplant: Secondary | ICD-10-CM | POA: Diagnosis not present

## 2022-08-14 DIAGNOSIS — Z9861 Coronary angioplasty status: Secondary | ICD-10-CM | POA: Diagnosis not present

## 2022-08-14 DIAGNOSIS — Z940 Kidney transplant status: Secondary | ICD-10-CM | POA: Diagnosis not present

## 2022-08-14 DIAGNOSIS — R7303 Prediabetes: Secondary | ICD-10-CM | POA: Diagnosis not present

## 2022-08-14 DIAGNOSIS — Z5181 Encounter for therapeutic drug level monitoring: Secondary | ICD-10-CM | POA: Diagnosis not present

## 2022-08-14 DIAGNOSIS — I2510 Atherosclerotic heart disease of native coronary artery without angina pectoris: Secondary | ICD-10-CM | POA: Diagnosis not present

## 2022-08-14 DIAGNOSIS — I10 Essential (primary) hypertension: Secondary | ICD-10-CM | POA: Diagnosis not present

## 2022-08-14 DIAGNOSIS — D649 Anemia, unspecified: Secondary | ICD-10-CM | POA: Diagnosis not present

## 2022-08-14 DIAGNOSIS — E785 Hyperlipidemia, unspecified: Secondary | ICD-10-CM | POA: Diagnosis not present

## 2022-08-21 DIAGNOSIS — R1031 Right lower quadrant pain: Secondary | ICD-10-CM | POA: Diagnosis not present

## 2022-08-21 DIAGNOSIS — D84821 Immunodeficiency due to drugs: Secondary | ICD-10-CM | POA: Diagnosis not present

## 2022-08-21 DIAGNOSIS — Z940 Kidney transplant status: Secondary | ICD-10-CM | POA: Diagnosis not present

## 2022-08-21 DIAGNOSIS — I2510 Atherosclerotic heart disease of native coronary artery without angina pectoris: Secondary | ICD-10-CM | POA: Diagnosis not present

## 2022-08-21 DIAGNOSIS — I10 Essential (primary) hypertension: Secondary | ICD-10-CM | POA: Diagnosis not present

## 2022-08-21 DIAGNOSIS — I120 Hypertensive chronic kidney disease with stage 5 chronic kidney disease or end stage renal disease: Secondary | ICD-10-CM | POA: Diagnosis not present

## 2022-08-21 DIAGNOSIS — D849 Immunodeficiency, unspecified: Secondary | ICD-10-CM | POA: Diagnosis not present

## 2022-08-21 DIAGNOSIS — E785 Hyperlipidemia, unspecified: Secondary | ICD-10-CM | POA: Diagnosis not present

## 2022-08-21 DIAGNOSIS — Z5181 Encounter for therapeutic drug level monitoring: Secondary | ICD-10-CM | POA: Diagnosis not present

## 2022-08-21 DIAGNOSIS — E875 Hyperkalemia: Secondary | ICD-10-CM | POA: Diagnosis not present

## 2022-08-21 DIAGNOSIS — Z79899 Other long term (current) drug therapy: Secondary | ICD-10-CM | POA: Diagnosis not present

## 2022-08-21 DIAGNOSIS — Z4822 Encounter for aftercare following kidney transplant: Secondary | ICD-10-CM | POA: Diagnosis not present

## 2022-08-21 DIAGNOSIS — R200 Anesthesia of skin: Secondary | ICD-10-CM | POA: Diagnosis not present

## 2022-08-21 DIAGNOSIS — Q613 Polycystic kidney, unspecified: Secondary | ICD-10-CM | POA: Diagnosis not present

## 2022-08-27 DIAGNOSIS — I10 Essential (primary) hypertension: Secondary | ICD-10-CM | POA: Diagnosis not present

## 2022-08-27 DIAGNOSIS — B259 Cytomegaloviral disease, unspecified: Secondary | ICD-10-CM | POA: Diagnosis not present

## 2022-08-27 DIAGNOSIS — E875 Hyperkalemia: Secondary | ICD-10-CM | POA: Diagnosis not present

## 2022-08-27 DIAGNOSIS — R102 Pelvic and perineal pain: Secondary | ICD-10-CM | POA: Diagnosis not present

## 2022-08-27 DIAGNOSIS — E785 Hyperlipidemia, unspecified: Secondary | ICD-10-CM | POA: Diagnosis not present

## 2022-08-27 DIAGNOSIS — R1031 Right lower quadrant pain: Secondary | ICD-10-CM | POA: Diagnosis not present

## 2022-08-27 DIAGNOSIS — Z4822 Encounter for aftercare following kidney transplant: Secondary | ICD-10-CM | POA: Diagnosis not present

## 2022-08-27 DIAGNOSIS — R739 Hyperglycemia, unspecified: Secondary | ICD-10-CM | POA: Diagnosis not present

## 2022-09-03 DIAGNOSIS — D849 Immunodeficiency, unspecified: Secondary | ICD-10-CM | POA: Diagnosis not present

## 2022-09-03 DIAGNOSIS — Z940 Kidney transplant status: Secondary | ICD-10-CM | POA: Diagnosis not present

## 2022-09-04 DIAGNOSIS — G4733 Obstructive sleep apnea (adult) (pediatric): Secondary | ICD-10-CM | POA: Diagnosis not present

## 2022-09-04 DIAGNOSIS — R634 Abnormal weight loss: Secondary | ICD-10-CM | POA: Diagnosis not present

## 2022-09-04 DIAGNOSIS — Z940 Kidney transplant status: Secondary | ICD-10-CM | POA: Diagnosis not present

## 2022-09-10 DIAGNOSIS — Z940 Kidney transplant status: Secondary | ICD-10-CM | POA: Diagnosis not present

## 2022-09-10 DIAGNOSIS — Z955 Presence of coronary angioplasty implant and graft: Secondary | ICD-10-CM | POA: Diagnosis not present

## 2022-09-10 DIAGNOSIS — R7303 Prediabetes: Secondary | ICD-10-CM | POA: Diagnosis not present

## 2022-09-10 DIAGNOSIS — D8989 Other specified disorders involving the immune mechanism, not elsewhere classified: Secondary | ICD-10-CM | POA: Diagnosis not present

## 2022-09-10 DIAGNOSIS — Z4822 Encounter for aftercare following kidney transplant: Secondary | ICD-10-CM | POA: Diagnosis not present

## 2022-09-10 DIAGNOSIS — Z79621 Long term (current) use of calcineurin inhibitor: Secondary | ICD-10-CM | POA: Diagnosis not present

## 2022-09-10 DIAGNOSIS — Z79624 Long term (current) use of inhibitors of nucleotide synthesis: Secondary | ICD-10-CM | POA: Diagnosis not present

## 2022-09-10 DIAGNOSIS — D849 Immunodeficiency, unspecified: Secondary | ICD-10-CM | POA: Diagnosis not present

## 2022-09-10 DIAGNOSIS — Z5181 Encounter for therapeutic drug level monitoring: Secondary | ICD-10-CM | POA: Diagnosis not present

## 2022-09-10 DIAGNOSIS — I10 Essential (primary) hypertension: Secondary | ICD-10-CM | POA: Diagnosis not present

## 2022-09-10 DIAGNOSIS — D649 Anemia, unspecified: Secondary | ICD-10-CM | POA: Diagnosis not present

## 2022-09-10 DIAGNOSIS — Z7952 Long term (current) use of systemic steroids: Secondary | ICD-10-CM | POA: Diagnosis not present

## 2022-09-10 DIAGNOSIS — I2510 Atherosclerotic heart disease of native coronary artery without angina pectoris: Secondary | ICD-10-CM | POA: Diagnosis not present

## 2022-09-10 DIAGNOSIS — Z905 Acquired absence of kidney: Secondary | ICD-10-CM | POA: Diagnosis not present

## 2022-09-19 DIAGNOSIS — Z5181 Encounter for therapeutic drug level monitoring: Secondary | ICD-10-CM | POA: Diagnosis not present

## 2022-09-19 DIAGNOSIS — Z79621 Long term (current) use of calcineurin inhibitor: Secondary | ICD-10-CM | POA: Diagnosis not present

## 2022-09-19 DIAGNOSIS — Z4822 Encounter for aftercare following kidney transplant: Secondary | ICD-10-CM | POA: Diagnosis not present

## 2022-09-24 DIAGNOSIS — Z79899 Other long term (current) drug therapy: Secondary | ICD-10-CM | POA: Diagnosis not present

## 2022-09-24 DIAGNOSIS — Z5181 Encounter for therapeutic drug level monitoring: Secondary | ICD-10-CM | POA: Diagnosis not present

## 2022-09-24 DIAGNOSIS — L659 Nonscarring hair loss, unspecified: Secondary | ICD-10-CM | POA: Diagnosis not present

## 2022-09-24 DIAGNOSIS — D849 Immunodeficiency, unspecified: Secondary | ICD-10-CM | POA: Diagnosis not present

## 2022-09-24 DIAGNOSIS — I10 Essential (primary) hypertension: Secondary | ICD-10-CM | POA: Diagnosis not present

## 2022-09-24 DIAGNOSIS — Z4822 Encounter for aftercare following kidney transplant: Secondary | ICD-10-CM | POA: Diagnosis not present

## 2022-09-24 DIAGNOSIS — Z79621 Long term (current) use of calcineurin inhibitor: Secondary | ICD-10-CM | POA: Diagnosis not present

## 2022-09-24 DIAGNOSIS — R7303 Prediabetes: Secondary | ICD-10-CM | POA: Diagnosis not present

## 2022-09-25 DIAGNOSIS — Z5181 Encounter for therapeutic drug level monitoring: Secondary | ICD-10-CM | POA: Diagnosis not present

## 2022-09-25 DIAGNOSIS — I10 Essential (primary) hypertension: Secondary | ICD-10-CM | POA: Diagnosis not present

## 2022-09-25 DIAGNOSIS — Z79621 Long term (current) use of calcineurin inhibitor: Secondary | ICD-10-CM | POA: Diagnosis not present

## 2022-09-25 DIAGNOSIS — Z4822 Encounter for aftercare following kidney transplant: Secondary | ICD-10-CM | POA: Diagnosis not present

## 2022-09-25 DIAGNOSIS — Z940 Kidney transplant status: Secondary | ICD-10-CM | POA: Diagnosis not present

## 2022-09-25 DIAGNOSIS — L659 Nonscarring hair loss, unspecified: Secondary | ICD-10-CM | POA: Diagnosis not present

## 2022-09-25 DIAGNOSIS — D849 Immunodeficiency, unspecified: Secondary | ICD-10-CM | POA: Diagnosis not present

## 2022-09-25 DIAGNOSIS — Z79899 Other long term (current) drug therapy: Secondary | ICD-10-CM | POA: Diagnosis not present

## 2022-09-25 DIAGNOSIS — R7303 Prediabetes: Secondary | ICD-10-CM | POA: Diagnosis not present

## 2022-10-04 DIAGNOSIS — I2510 Atherosclerotic heart disease of native coronary artery without angina pectoris: Secondary | ICD-10-CM | POA: Diagnosis not present

## 2022-10-04 DIAGNOSIS — Z7982 Long term (current) use of aspirin: Secondary | ICD-10-CM | POA: Diagnosis not present

## 2022-10-04 DIAGNOSIS — I10 Essential (primary) hypertension: Secondary | ICD-10-CM | POA: Diagnosis not present

## 2022-10-04 DIAGNOSIS — E785 Hyperlipidemia, unspecified: Secondary | ICD-10-CM | POA: Diagnosis not present

## 2022-10-04 DIAGNOSIS — Z79899 Other long term (current) drug therapy: Secondary | ICD-10-CM | POA: Diagnosis not present

## 2022-10-09 DIAGNOSIS — Z5181 Encounter for therapeutic drug level monitoring: Secondary | ICD-10-CM | POA: Diagnosis not present

## 2022-10-09 DIAGNOSIS — Z79621 Long term (current) use of calcineurin inhibitor: Secondary | ICD-10-CM | POA: Diagnosis not present

## 2022-10-09 DIAGNOSIS — Z4822 Encounter for aftercare following kidney transplant: Secondary | ICD-10-CM | POA: Diagnosis not present

## 2022-10-22 DIAGNOSIS — E785 Hyperlipidemia, unspecified: Secondary | ICD-10-CM | POA: Diagnosis not present

## 2022-10-22 DIAGNOSIS — I10 Essential (primary) hypertension: Secondary | ICD-10-CM | POA: Diagnosis not present

## 2022-10-22 DIAGNOSIS — D649 Anemia, unspecified: Secondary | ICD-10-CM | POA: Diagnosis not present

## 2022-10-22 DIAGNOSIS — Z4822 Encounter for aftercare following kidney transplant: Secondary | ICD-10-CM | POA: Diagnosis not present

## 2022-10-22 DIAGNOSIS — Z5181 Encounter for therapeutic drug level monitoring: Secondary | ICD-10-CM | POA: Diagnosis not present

## 2022-10-22 DIAGNOSIS — I151 Hypertension secondary to other renal disorders: Secondary | ICD-10-CM | POA: Diagnosis not present

## 2022-10-22 DIAGNOSIS — E875 Hyperkalemia: Secondary | ICD-10-CM | POA: Diagnosis not present

## 2022-10-22 DIAGNOSIS — L659 Nonscarring hair loss, unspecified: Secondary | ICD-10-CM | POA: Diagnosis not present

## 2022-10-22 DIAGNOSIS — Z79899 Other long term (current) drug therapy: Secondary | ICD-10-CM | POA: Diagnosis not present

## 2022-10-22 DIAGNOSIS — Z79621 Long term (current) use of calcineurin inhibitor: Secondary | ICD-10-CM | POA: Diagnosis not present

## 2022-10-22 DIAGNOSIS — B259 Cytomegaloviral disease, unspecified: Secondary | ICD-10-CM | POA: Diagnosis not present

## 2022-10-22 DIAGNOSIS — Z940 Kidney transplant status: Secondary | ICD-10-CM | POA: Diagnosis not present

## 2022-10-22 DIAGNOSIS — I2510 Atherosclerotic heart disease of native coronary artery without angina pectoris: Secondary | ICD-10-CM | POA: Diagnosis not present

## 2022-10-22 DIAGNOSIS — Q613 Polycystic kidney, unspecified: Secondary | ICD-10-CM | POA: Diagnosis not present

## 2022-11-05 DIAGNOSIS — Z4822 Encounter for aftercare following kidney transplant: Secondary | ICD-10-CM | POA: Diagnosis not present

## 2022-11-05 DIAGNOSIS — Z79621 Long term (current) use of calcineurin inhibitor: Secondary | ICD-10-CM | POA: Diagnosis not present

## 2022-11-05 DIAGNOSIS — Z5181 Encounter for therapeutic drug level monitoring: Secondary | ICD-10-CM | POA: Diagnosis not present

## 2022-11-19 DIAGNOSIS — Z7982 Long term (current) use of aspirin: Secondary | ICD-10-CM | POA: Diagnosis not present

## 2022-11-19 DIAGNOSIS — Z79899 Other long term (current) drug therapy: Secondary | ICD-10-CM | POA: Diagnosis not present

## 2022-11-19 DIAGNOSIS — I2510 Atherosclerotic heart disease of native coronary artery without angina pectoris: Secondary | ICD-10-CM | POA: Diagnosis not present

## 2022-11-19 DIAGNOSIS — Z940 Kidney transplant status: Secondary | ICD-10-CM | POA: Diagnosis not present

## 2022-11-19 DIAGNOSIS — Z4822 Encounter for aftercare following kidney transplant: Secondary | ICD-10-CM | POA: Diagnosis not present

## 2022-11-19 DIAGNOSIS — D84821 Immunodeficiency due to drugs: Secondary | ICD-10-CM | POA: Diagnosis not present

## 2022-11-19 DIAGNOSIS — K219 Gastro-esophageal reflux disease without esophagitis: Secondary | ICD-10-CM | POA: Diagnosis not present

## 2022-11-19 DIAGNOSIS — I10 Essential (primary) hypertension: Secondary | ICD-10-CM | POA: Diagnosis not present

## 2022-11-19 DIAGNOSIS — M109 Gout, unspecified: Secondary | ICD-10-CM | POA: Diagnosis not present

## 2022-11-27 DIAGNOSIS — R972 Elevated prostate specific antigen [PSA]: Secondary | ICD-10-CM | POA: Diagnosis not present

## 2022-12-03 DIAGNOSIS — Z940 Kidney transplant status: Secondary | ICD-10-CM | POA: Diagnosis not present

## 2022-12-04 DIAGNOSIS — N5201 Erectile dysfunction due to arterial insufficiency: Secondary | ICD-10-CM | POA: Diagnosis not present

## 2022-12-04 DIAGNOSIS — Q613 Polycystic kidney, unspecified: Secondary | ICD-10-CM | POA: Diagnosis not present

## 2022-12-04 DIAGNOSIS — C642 Malignant neoplasm of left kidney, except renal pelvis: Secondary | ICD-10-CM | POA: Diagnosis not present

## 2022-12-04 DIAGNOSIS — R972 Elevated prostate specific antigen [PSA]: Secondary | ICD-10-CM | POA: Diagnosis not present

## 2022-12-04 DIAGNOSIS — Z905 Acquired absence of kidney: Secondary | ICD-10-CM | POA: Diagnosis not present

## 2022-12-17 DIAGNOSIS — Z79621 Long term (current) use of calcineurin inhibitor: Secondary | ICD-10-CM | POA: Diagnosis not present

## 2022-12-17 DIAGNOSIS — D702 Other drug-induced agranulocytosis: Secondary | ICD-10-CM | POA: Diagnosis not present

## 2022-12-17 DIAGNOSIS — B348 Other viral infections of unspecified site: Secondary | ICD-10-CM | POA: Diagnosis not present

## 2022-12-17 DIAGNOSIS — Z940 Kidney transplant status: Secondary | ICD-10-CM | POA: Diagnosis not present

## 2022-12-17 DIAGNOSIS — I151 Hypertension secondary to other renal disorders: Secondary | ICD-10-CM | POA: Diagnosis not present

## 2022-12-17 DIAGNOSIS — Z905 Acquired absence of kidney: Secondary | ICD-10-CM | POA: Diagnosis not present

## 2022-12-17 DIAGNOSIS — B259 Cytomegaloviral disease, unspecified: Secondary | ICD-10-CM | POA: Diagnosis not present

## 2022-12-17 DIAGNOSIS — Z792 Long term (current) use of antibiotics: Secondary | ICD-10-CM | POA: Diagnosis not present

## 2022-12-17 DIAGNOSIS — Z5181 Encounter for therapeutic drug level monitoring: Secondary | ICD-10-CM | POA: Diagnosis not present

## 2022-12-27 DIAGNOSIS — H26492 Other secondary cataract, left eye: Secondary | ICD-10-CM | POA: Diagnosis not present

## 2022-12-27 DIAGNOSIS — H35033 Hypertensive retinopathy, bilateral: Secondary | ICD-10-CM | POA: Diagnosis not present

## 2022-12-27 DIAGNOSIS — H35373 Puckering of macula, bilateral: Secondary | ICD-10-CM | POA: Diagnosis not present

## 2022-12-27 DIAGNOSIS — Q141 Congenital malformation of retina: Secondary | ICD-10-CM | POA: Diagnosis not present

## 2022-12-27 DIAGNOSIS — H35013 Changes in retinal vascular appearance, bilateral: Secondary | ICD-10-CM | POA: Diagnosis not present

## 2022-12-31 DIAGNOSIS — Z940 Kidney transplant status: Secondary | ICD-10-CM | POA: Diagnosis not present

## 2023-01-03 IMAGING — DX DG CHEST 2V
2 series · 2 of 2 positions shown · non-contrast
Comparison: Chest x-ray 11/24/2019

CLINICAL DATA: Dyspnea

EXAM:
CHEST - 2 VIEW

[chest pa]
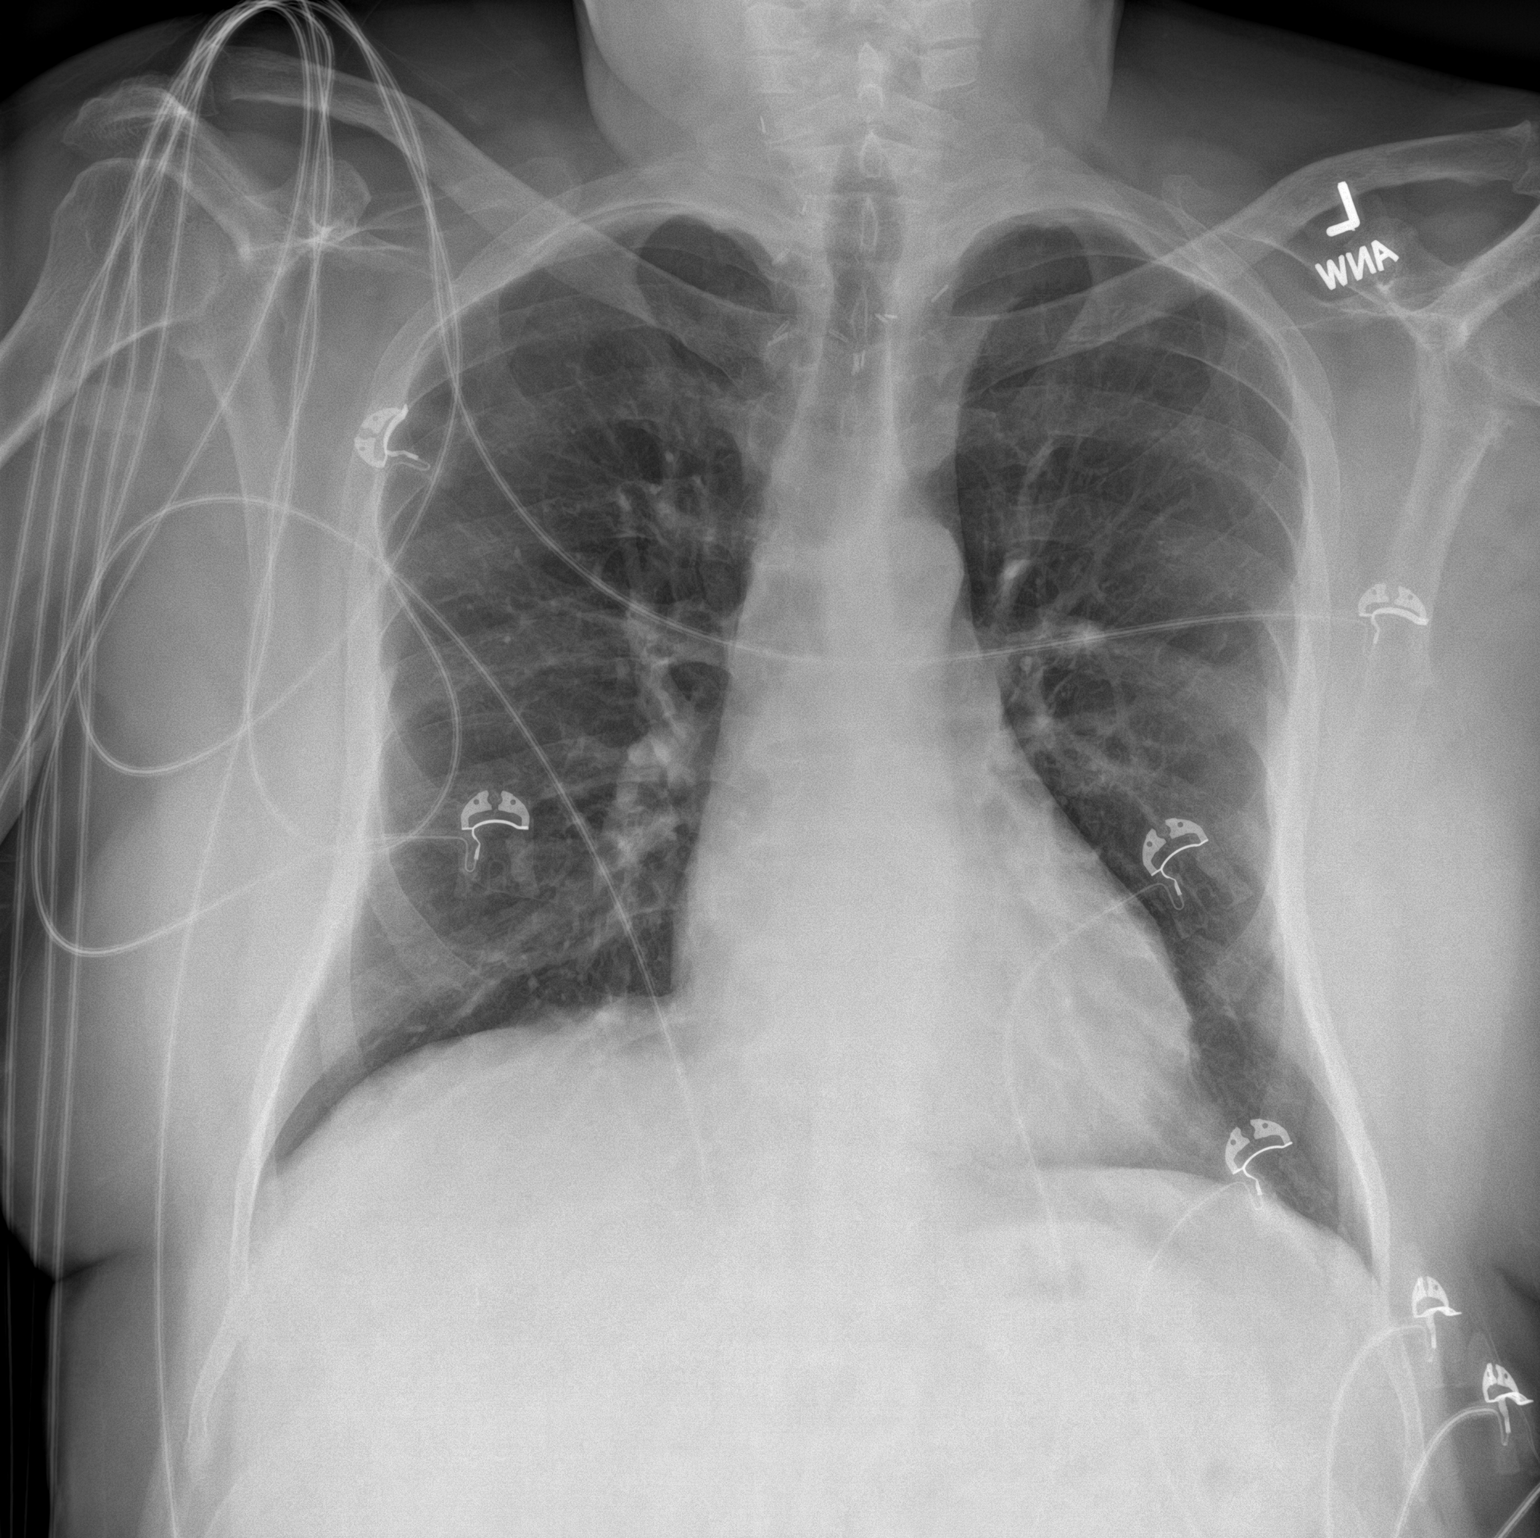

[chest lat]
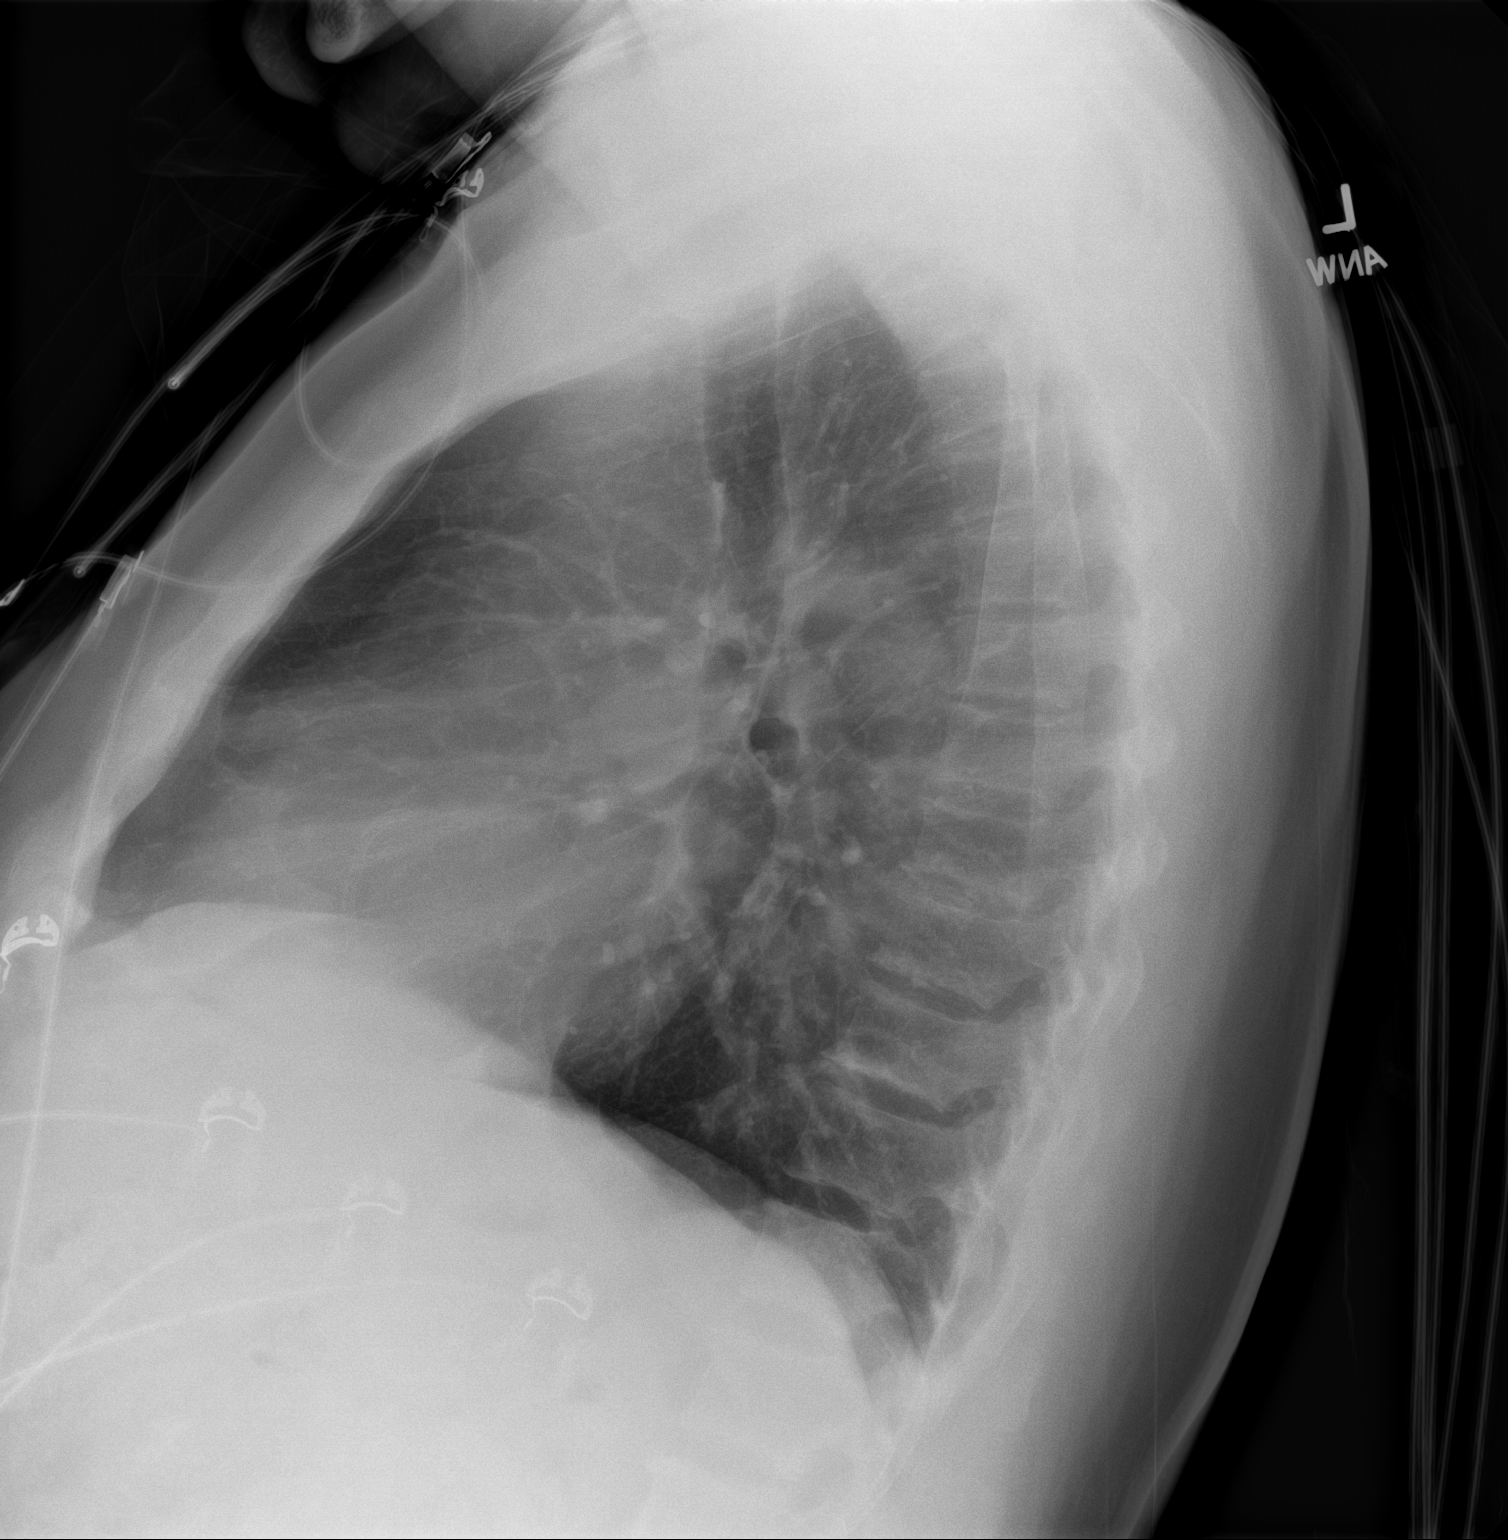

[2 of 2 positions shown; findings below may reference images not displayed]

FINDINGS: Heart size and mediastinal contours are within normal limits. No
suspicious pulmonary opacities identified. Surgical clips projecting
over the neck and superior mediastinum.

No pleural effusion or pneumothorax visualized.

No acute osseous abnormality appreciated.
IMPRESSION: No acute intrathoracic process identified.

## 2023-01-09 DIAGNOSIS — Z6824 Body mass index (BMI) 24.0-24.9, adult: Secondary | ICD-10-CM | POA: Diagnosis not present

## 2023-01-09 DIAGNOSIS — Z0000 Encounter for general adult medical examination without abnormal findings: Secondary | ICD-10-CM | POA: Diagnosis not present

## 2023-01-14 DIAGNOSIS — B259 Cytomegaloviral disease, unspecified: Secondary | ICD-10-CM | POA: Diagnosis not present

## 2023-01-14 DIAGNOSIS — R7303 Prediabetes: Secondary | ICD-10-CM | POA: Diagnosis not present

## 2023-01-14 DIAGNOSIS — I10 Essential (primary) hypertension: Secondary | ICD-10-CM | POA: Diagnosis not present

## 2023-01-14 DIAGNOSIS — D849 Immunodeficiency, unspecified: Secondary | ICD-10-CM | POA: Diagnosis not present

## 2023-01-14 DIAGNOSIS — Z940 Kidney transplant status: Secondary | ICD-10-CM | POA: Diagnosis not present

## 2023-01-14 DIAGNOSIS — B348 Other viral infections of unspecified site: Secondary | ICD-10-CM | POA: Diagnosis not present

## 2023-01-14 DIAGNOSIS — Z79899 Other long term (current) drug therapy: Secondary | ICD-10-CM | POA: Diagnosis not present

## 2023-01-23 DIAGNOSIS — M545 Low back pain, unspecified: Secondary | ICD-10-CM | POA: Diagnosis not present

## 2023-01-25 DIAGNOSIS — S335XXD Sprain of ligaments of lumbar spine, subsequent encounter: Secondary | ICD-10-CM | POA: Diagnosis not present

## 2023-01-28 DIAGNOSIS — Z940 Kidney transplant status: Secondary | ICD-10-CM | POA: Diagnosis not present

## 2023-01-29 DIAGNOSIS — S335XXD Sprain of ligaments of lumbar spine, subsequent encounter: Secondary | ICD-10-CM | POA: Diagnosis not present

## 2023-01-31 DIAGNOSIS — S335XXD Sprain of ligaments of lumbar spine, subsequent encounter: Secondary | ICD-10-CM | POA: Diagnosis not present

## 2023-02-01 DIAGNOSIS — E790 Hyperuricemia without signs of inflammatory arthritis and tophaceous disease: Secondary | ICD-10-CM | POA: Diagnosis not present

## 2023-02-01 DIAGNOSIS — N1832 Chronic kidney disease, stage 3b: Secondary | ICD-10-CM | POA: Diagnosis not present

## 2023-02-01 DIAGNOSIS — D638 Anemia in other chronic diseases classified elsewhere: Secondary | ICD-10-CM | POA: Diagnosis not present

## 2023-02-01 DIAGNOSIS — Z0000 Encounter for general adult medical examination without abnormal findings: Secondary | ICD-10-CM | POA: Diagnosis not present

## 2023-02-01 DIAGNOSIS — I10 Essential (primary) hypertension: Secondary | ICD-10-CM | POA: Diagnosis not present

## 2023-02-01 DIAGNOSIS — E213 Hyperparathyroidism, unspecified: Secondary | ICD-10-CM | POA: Diagnosis not present

## 2023-02-01 DIAGNOSIS — E890 Postprocedural hypothyroidism: Secondary | ICD-10-CM | POA: Diagnosis not present

## 2023-02-01 DIAGNOSIS — D84821 Immunodeficiency due to drugs: Secondary | ICD-10-CM | POA: Diagnosis not present

## 2023-02-01 DIAGNOSIS — I2510 Atherosclerotic heart disease of native coronary artery without angina pectoris: Secondary | ICD-10-CM | POA: Diagnosis not present

## 2023-02-05 DIAGNOSIS — M79645 Pain in left finger(s): Secondary | ICD-10-CM | POA: Diagnosis not present

## 2023-02-05 DIAGNOSIS — S335XXD Sprain of ligaments of lumbar spine, subsequent encounter: Secondary | ICD-10-CM | POA: Diagnosis not present

## 2023-02-07 DIAGNOSIS — S335XXD Sprain of ligaments of lumbar spine, subsequent encounter: Secondary | ICD-10-CM | POA: Diagnosis not present

## 2023-02-11 DIAGNOSIS — E119 Type 2 diabetes mellitus without complications: Secondary | ICD-10-CM | POA: Diagnosis not present

## 2023-02-11 DIAGNOSIS — B259 Cytomegaloviral disease, unspecified: Secondary | ICD-10-CM | POA: Diagnosis not present

## 2023-02-11 DIAGNOSIS — D849 Immunodeficiency, unspecified: Secondary | ICD-10-CM | POA: Diagnosis not present

## 2023-02-11 DIAGNOSIS — Z940 Kidney transplant status: Secondary | ICD-10-CM | POA: Diagnosis not present

## 2023-02-11 DIAGNOSIS — Z79899 Other long term (current) drug therapy: Secondary | ICD-10-CM | POA: Diagnosis not present

## 2023-02-11 DIAGNOSIS — I10 Essential (primary) hypertension: Secondary | ICD-10-CM | POA: Diagnosis not present

## 2023-02-11 DIAGNOSIS — B348 Other viral infections of unspecified site: Secondary | ICD-10-CM | POA: Diagnosis not present

## 2023-02-12 DIAGNOSIS — S335XXD Sprain of ligaments of lumbar spine, subsequent encounter: Secondary | ICD-10-CM | POA: Diagnosis not present

## 2023-02-14 DIAGNOSIS — S335XXD Sprain of ligaments of lumbar spine, subsequent encounter: Secondary | ICD-10-CM | POA: Diagnosis not present

## 2023-02-19 DIAGNOSIS — S335XXD Sprain of ligaments of lumbar spine, subsequent encounter: Secondary | ICD-10-CM | POA: Diagnosis not present

## 2023-02-25 DIAGNOSIS — D849 Immunodeficiency, unspecified: Secondary | ICD-10-CM | POA: Diagnosis not present

## 2023-02-25 DIAGNOSIS — Z940 Kidney transplant status: Secondary | ICD-10-CM | POA: Diagnosis not present

## 2023-02-26 DIAGNOSIS — S335XXD Sprain of ligaments of lumbar spine, subsequent encounter: Secondary | ICD-10-CM | POA: Diagnosis not present

## 2023-02-27 DIAGNOSIS — M79645 Pain in left finger(s): Secondary | ICD-10-CM | POA: Diagnosis not present

## 2023-02-28 DIAGNOSIS — S335XXD Sprain of ligaments of lumbar spine, subsequent encounter: Secondary | ICD-10-CM | POA: Diagnosis not present

## 2023-03-05 DIAGNOSIS — S335XXD Sprain of ligaments of lumbar spine, subsequent encounter: Secondary | ICD-10-CM | POA: Diagnosis not present

## 2023-03-05 DIAGNOSIS — M65331 Trigger finger, right middle finger: Secondary | ICD-10-CM | POA: Diagnosis not present

## 2023-03-07 DIAGNOSIS — S335XXD Sprain of ligaments of lumbar spine, subsequent encounter: Secondary | ICD-10-CM | POA: Diagnosis not present

## 2023-03-11 DIAGNOSIS — B348 Other viral infections of unspecified site: Secondary | ICD-10-CM | POA: Diagnosis not present

## 2023-03-11 DIAGNOSIS — R739 Hyperglycemia, unspecified: Secondary | ICD-10-CM | POA: Diagnosis not present

## 2023-03-11 DIAGNOSIS — Z940 Kidney transplant status: Secondary | ICD-10-CM | POA: Diagnosis not present

## 2023-03-11 DIAGNOSIS — Z4822 Encounter for aftercare following kidney transplant: Secondary | ICD-10-CM | POA: Diagnosis not present

## 2023-03-11 DIAGNOSIS — Z5181 Encounter for therapeutic drug level monitoring: Secondary | ICD-10-CM | POA: Diagnosis not present

## 2023-03-11 DIAGNOSIS — I10 Essential (primary) hypertension: Secondary | ICD-10-CM | POA: Diagnosis not present

## 2023-03-11 DIAGNOSIS — E785 Hyperlipidemia, unspecified: Secondary | ICD-10-CM | POA: Diagnosis not present

## 2023-03-11 DIAGNOSIS — D849 Immunodeficiency, unspecified: Secondary | ICD-10-CM | POA: Diagnosis not present

## 2023-03-12 DIAGNOSIS — S335XXD Sprain of ligaments of lumbar spine, subsequent encounter: Secondary | ICD-10-CM | POA: Diagnosis not present

## 2023-03-14 DIAGNOSIS — S335XXD Sprain of ligaments of lumbar spine, subsequent encounter: Secondary | ICD-10-CM | POA: Diagnosis not present

## 2023-03-19 DIAGNOSIS — S335XXD Sprain of ligaments of lumbar spine, subsequent encounter: Secondary | ICD-10-CM | POA: Diagnosis not present

## 2023-03-25 DIAGNOSIS — Z940 Kidney transplant status: Secondary | ICD-10-CM | POA: Diagnosis not present

## 2023-03-25 DIAGNOSIS — B348 Other viral infections of unspecified site: Secondary | ICD-10-CM | POA: Diagnosis not present

## 2023-04-08 DIAGNOSIS — Z4822 Encounter for aftercare following kidney transplant: Secondary | ICD-10-CM | POA: Diagnosis not present

## 2023-04-08 DIAGNOSIS — E119 Type 2 diabetes mellitus without complications: Secondary | ICD-10-CM | POA: Diagnosis not present

## 2023-04-08 DIAGNOSIS — Z940 Kidney transplant status: Secondary | ICD-10-CM | POA: Diagnosis not present

## 2023-04-22 DIAGNOSIS — Z940 Kidney transplant status: Secondary | ICD-10-CM | POA: Diagnosis not present

## 2023-05-06 DIAGNOSIS — Z940 Kidney transplant status: Secondary | ICD-10-CM | POA: Diagnosis not present

## 2023-05-06 DIAGNOSIS — B348 Other viral infections of unspecified site: Secondary | ICD-10-CM | POA: Diagnosis not present

## 2023-05-06 DIAGNOSIS — D849 Immunodeficiency, unspecified: Secondary | ICD-10-CM | POA: Diagnosis not present

## 2023-05-06 DIAGNOSIS — I10 Essential (primary) hypertension: Secondary | ICD-10-CM | POA: Diagnosis not present

## 2023-05-21 DIAGNOSIS — Z940 Kidney transplant status: Secondary | ICD-10-CM | POA: Diagnosis not present

## 2023-06-03 DIAGNOSIS — I10 Essential (primary) hypertension: Secondary | ICD-10-CM | POA: Diagnosis not present

## 2023-06-03 DIAGNOSIS — Z4822 Encounter for aftercare following kidney transplant: Secondary | ICD-10-CM | POA: Diagnosis not present

## 2023-06-03 DIAGNOSIS — Z940 Kidney transplant status: Secondary | ICD-10-CM | POA: Diagnosis not present

## 2023-06-03 DIAGNOSIS — D849 Immunodeficiency, unspecified: Secondary | ICD-10-CM | POA: Diagnosis not present

## 2023-06-03 DIAGNOSIS — Z79621 Long term (current) use of calcineurin inhibitor: Secondary | ICD-10-CM | POA: Diagnosis not present

## 2023-06-03 DIAGNOSIS — Z5181 Encounter for therapeutic drug level monitoring: Secondary | ICD-10-CM | POA: Diagnosis not present

## 2023-06-03 DIAGNOSIS — B259 Cytomegaloviral disease, unspecified: Secondary | ICD-10-CM | POA: Diagnosis not present

## 2023-06-03 DIAGNOSIS — R739 Hyperglycemia, unspecified: Secondary | ICD-10-CM | POA: Diagnosis not present

## 2023-06-03 DIAGNOSIS — Z23 Encounter for immunization: Secondary | ICD-10-CM | POA: Diagnosis not present

## 2023-06-18 DIAGNOSIS — B348 Other viral infections of unspecified site: Secondary | ICD-10-CM | POA: Diagnosis not present

## 2023-06-18 DIAGNOSIS — Z940 Kidney transplant status: Secondary | ICD-10-CM | POA: Diagnosis not present

## 2023-07-01 DIAGNOSIS — B348 Other viral infections of unspecified site: Secondary | ICD-10-CM | POA: Diagnosis not present

## 2023-07-01 DIAGNOSIS — E119 Type 2 diabetes mellitus without complications: Secondary | ICD-10-CM | POA: Diagnosis not present

## 2023-07-01 DIAGNOSIS — Z79899 Other long term (current) drug therapy: Secondary | ICD-10-CM | POA: Diagnosis not present

## 2023-07-01 DIAGNOSIS — B259 Cytomegaloviral disease, unspecified: Secondary | ICD-10-CM | POA: Diagnosis not present

## 2023-07-01 DIAGNOSIS — D849 Immunodeficiency, unspecified: Secondary | ICD-10-CM | POA: Diagnosis not present

## 2023-07-01 DIAGNOSIS — Z940 Kidney transplant status: Secondary | ICD-10-CM | POA: Diagnosis not present

## 2023-07-01 DIAGNOSIS — E785 Hyperlipidemia, unspecified: Secondary | ICD-10-CM | POA: Diagnosis not present

## 2023-07-01 DIAGNOSIS — I10 Essential (primary) hypertension: Secondary | ICD-10-CM | POA: Diagnosis not present

## 2023-07-01 DIAGNOSIS — R7303 Prediabetes: Secondary | ICD-10-CM | POA: Diagnosis not present

## 2023-07-06 DIAGNOSIS — M25512 Pain in left shoulder: Secondary | ICD-10-CM | POA: Diagnosis not present

## 2023-07-09 DIAGNOSIS — M25512 Pain in left shoulder: Secondary | ICD-10-CM | POA: Diagnosis not present

## 2023-07-17 DIAGNOSIS — M75122 Complete rotator cuff tear or rupture of left shoulder, not specified as traumatic: Secondary | ICD-10-CM | POA: Diagnosis not present

## 2023-07-23 DIAGNOSIS — Z949 Transplanted organ and tissue status, unspecified: Secondary | ICD-10-CM | POA: Diagnosis not present

## 2023-07-23 DIAGNOSIS — Z79621 Long term (current) use of calcineurin inhibitor: Secondary | ICD-10-CM | POA: Diagnosis not present

## 2023-07-23 DIAGNOSIS — R7303 Prediabetes: Secondary | ICD-10-CM | POA: Diagnosis not present

## 2023-07-23 DIAGNOSIS — Z940 Kidney transplant status: Secondary | ICD-10-CM | POA: Diagnosis not present

## 2023-07-23 DIAGNOSIS — Z79899 Other long term (current) drug therapy: Secondary | ICD-10-CM | POA: Diagnosis not present

## 2023-07-23 DIAGNOSIS — D849 Immunodeficiency, unspecified: Secondary | ICD-10-CM | POA: Diagnosis not present

## 2023-07-23 DIAGNOSIS — B348 Other viral infections of unspecified site: Secondary | ICD-10-CM | POA: Diagnosis not present

## 2023-07-23 DIAGNOSIS — Z4822 Encounter for aftercare following kidney transplant: Secondary | ICD-10-CM | POA: Diagnosis not present

## 2023-07-23 DIAGNOSIS — D84821 Immunodeficiency due to drugs: Secondary | ICD-10-CM | POA: Diagnosis not present

## 2023-07-23 DIAGNOSIS — Z5181 Encounter for therapeutic drug level monitoring: Secondary | ICD-10-CM | POA: Diagnosis not present

## 2023-08-06 DIAGNOSIS — E890 Postprocedural hypothyroidism: Secondary | ICD-10-CM | POA: Diagnosis not present

## 2023-08-06 DIAGNOSIS — N1832 Chronic kidney disease, stage 3b: Secondary | ICD-10-CM | POA: Diagnosis not present

## 2023-08-06 DIAGNOSIS — E7800 Pure hypercholesterolemia, unspecified: Secondary | ICD-10-CM | POA: Diagnosis not present

## 2023-08-06 DIAGNOSIS — I2510 Atherosclerotic heart disease of native coronary artery without angina pectoris: Secondary | ICD-10-CM | POA: Diagnosis not present

## 2023-08-06 DIAGNOSIS — I10 Essential (primary) hypertension: Secondary | ICD-10-CM | POA: Diagnosis not present

## 2023-08-06 DIAGNOSIS — E790 Hyperuricemia without signs of inflammatory arthritis and tophaceous disease: Secondary | ICD-10-CM | POA: Diagnosis not present

## 2023-08-27 DIAGNOSIS — Z940 Kidney transplant status: Secondary | ICD-10-CM | POA: Diagnosis not present

## 2023-08-27 DIAGNOSIS — B348 Other viral infections of unspecified site: Secondary | ICD-10-CM | POA: Diagnosis not present

## 2023-08-27 DIAGNOSIS — Z5181 Encounter for therapeutic drug level monitoring: Secondary | ICD-10-CM | POA: Diagnosis not present

## 2023-08-27 DIAGNOSIS — B259 Cytomegaloviral disease, unspecified: Secondary | ICD-10-CM | POA: Diagnosis not present

## 2023-08-27 DIAGNOSIS — Z79621 Long term (current) use of calcineurin inhibitor: Secondary | ICD-10-CM | POA: Diagnosis not present

## 2023-08-27 DIAGNOSIS — Z4822 Encounter for aftercare following kidney transplant: Secondary | ICD-10-CM | POA: Diagnosis not present

## 2023-09-04 DIAGNOSIS — Z940 Kidney transplant status: Secondary | ICD-10-CM | POA: Diagnosis not present

## 2023-09-04 DIAGNOSIS — G4733 Obstructive sleep apnea (adult) (pediatric): Secondary | ICD-10-CM | POA: Diagnosis not present

## 2023-09-10 DIAGNOSIS — G8918 Other acute postprocedural pain: Secondary | ICD-10-CM | POA: Diagnosis not present

## 2023-09-10 DIAGNOSIS — M25812 Other specified joint disorders, left shoulder: Secondary | ICD-10-CM | POA: Diagnosis not present

## 2023-09-10 DIAGNOSIS — S46012A Strain of muscle(s) and tendon(s) of the rotator cuff of left shoulder, initial encounter: Secondary | ICD-10-CM | POA: Diagnosis not present

## 2023-09-10 DIAGNOSIS — M7542 Impingement syndrome of left shoulder: Secondary | ICD-10-CM | POA: Diagnosis not present

## 2023-09-12 DIAGNOSIS — Z940 Kidney transplant status: Secondary | ICD-10-CM | POA: Diagnosis not present

## 2023-09-27 DIAGNOSIS — Z940 Kidney transplant status: Secondary | ICD-10-CM | POA: Diagnosis not present

## 2023-10-07 DIAGNOSIS — Z9889 Other specified postprocedural states: Secondary | ICD-10-CM | POA: Diagnosis not present

## 2023-10-07 DIAGNOSIS — R29898 Other symptoms and signs involving the musculoskeletal system: Secondary | ICD-10-CM | POA: Diagnosis not present

## 2023-10-10 DIAGNOSIS — Z79899 Other long term (current) drug therapy: Secondary | ICD-10-CM | POA: Diagnosis not present

## 2023-10-10 DIAGNOSIS — I10 Essential (primary) hypertension: Secondary | ICD-10-CM | POA: Diagnosis not present

## 2023-10-10 DIAGNOSIS — Z955 Presence of coronary angioplasty implant and graft: Secondary | ICD-10-CM | POA: Diagnosis not present

## 2023-10-10 DIAGNOSIS — N186 End stage renal disease: Secondary | ICD-10-CM | POA: Diagnosis not present

## 2023-10-10 DIAGNOSIS — Z7982 Long term (current) use of aspirin: Secondary | ICD-10-CM | POA: Diagnosis not present

## 2023-10-10 DIAGNOSIS — I2510 Atherosclerotic heart disease of native coronary artery without angina pectoris: Secondary | ICD-10-CM | POA: Diagnosis not present

## 2023-10-10 DIAGNOSIS — Z992 Dependence on renal dialysis: Secondary | ICD-10-CM | POA: Diagnosis not present

## 2023-10-25 DIAGNOSIS — Z940 Kidney transplant status: Secondary | ICD-10-CM | POA: Diagnosis not present

## 2023-10-28 DIAGNOSIS — Z79621 Long term (current) use of calcineurin inhibitor: Secondary | ICD-10-CM | POA: Diagnosis not present

## 2023-10-28 DIAGNOSIS — Z949 Transplanted organ and tissue status, unspecified: Secondary | ICD-10-CM | POA: Diagnosis not present

## 2023-10-28 DIAGNOSIS — D849 Immunodeficiency, unspecified: Secondary | ICD-10-CM | POA: Diagnosis not present

## 2023-10-28 DIAGNOSIS — Z79899 Other long term (current) drug therapy: Secondary | ICD-10-CM | POA: Diagnosis not present

## 2023-10-28 DIAGNOSIS — Z5181 Encounter for therapeutic drug level monitoring: Secondary | ICD-10-CM | POA: Diagnosis not present

## 2023-10-28 DIAGNOSIS — Z940 Kidney transplant status: Secondary | ICD-10-CM | POA: Diagnosis not present

## 2023-10-28 DIAGNOSIS — Z4822 Encounter for aftercare following kidney transplant: Secondary | ICD-10-CM | POA: Diagnosis not present

## 2023-10-28 DIAGNOSIS — D84821 Immunodeficiency due to drugs: Secondary | ICD-10-CM | POA: Diagnosis not present

## 2023-10-29 DIAGNOSIS — R29898 Other symptoms and signs involving the musculoskeletal system: Secondary | ICD-10-CM | POA: Diagnosis not present

## 2023-10-29 DIAGNOSIS — Z9889 Other specified postprocedural states: Secondary | ICD-10-CM | POA: Diagnosis not present

## 2023-10-31 DIAGNOSIS — Z9889 Other specified postprocedural states: Secondary | ICD-10-CM | POA: Diagnosis not present

## 2023-10-31 DIAGNOSIS — R29898 Other symptoms and signs involving the musculoskeletal system: Secondary | ICD-10-CM | POA: Diagnosis not present

## 2023-11-05 DIAGNOSIS — R29898 Other symptoms and signs involving the musculoskeletal system: Secondary | ICD-10-CM | POA: Diagnosis not present

## 2023-11-05 DIAGNOSIS — Z9889 Other specified postprocedural states: Secondary | ICD-10-CM | POA: Diagnosis not present

## 2023-11-08 DIAGNOSIS — R29898 Other symptoms and signs involving the musculoskeletal system: Secondary | ICD-10-CM | POA: Diagnosis not present

## 2023-11-08 DIAGNOSIS — Z9889 Other specified postprocedural states: Secondary | ICD-10-CM | POA: Diagnosis not present

## 2023-11-08 DIAGNOSIS — M75122 Complete rotator cuff tear or rupture of left shoulder, not specified as traumatic: Secondary | ICD-10-CM | POA: Diagnosis not present

## 2023-11-08 DIAGNOSIS — Z4789 Encounter for other orthopedic aftercare: Secondary | ICD-10-CM | POA: Diagnosis not present

## 2023-11-12 DIAGNOSIS — Z9889 Other specified postprocedural states: Secondary | ICD-10-CM | POA: Diagnosis not present

## 2023-11-12 DIAGNOSIS — R29898 Other symptoms and signs involving the musculoskeletal system: Secondary | ICD-10-CM | POA: Diagnosis not present

## 2023-11-19 DIAGNOSIS — Z9889 Other specified postprocedural states: Secondary | ICD-10-CM | POA: Diagnosis not present

## 2023-11-19 DIAGNOSIS — R29898 Other symptoms and signs involving the musculoskeletal system: Secondary | ICD-10-CM | POA: Diagnosis not present

## 2023-11-22 DIAGNOSIS — Z940 Kidney transplant status: Secondary | ICD-10-CM | POA: Diagnosis not present

## 2023-11-25 DIAGNOSIS — Z79621 Long term (current) use of calcineurin inhibitor: Secondary | ICD-10-CM | POA: Diagnosis not present

## 2023-11-25 DIAGNOSIS — E785 Hyperlipidemia, unspecified: Secondary | ICD-10-CM | POA: Diagnosis not present

## 2023-11-25 DIAGNOSIS — E119 Type 2 diabetes mellitus without complications: Secondary | ICD-10-CM | POA: Diagnosis not present

## 2023-11-25 DIAGNOSIS — Z940 Kidney transplant status: Secondary | ICD-10-CM | POA: Diagnosis not present

## 2023-11-25 DIAGNOSIS — Z4822 Encounter for aftercare following kidney transplant: Secondary | ICD-10-CM | POA: Diagnosis not present

## 2023-11-25 DIAGNOSIS — I10 Essential (primary) hypertension: Secondary | ICD-10-CM | POA: Diagnosis not present

## 2023-11-25 DIAGNOSIS — Z5181 Encounter for therapeutic drug level monitoring: Secondary | ICD-10-CM | POA: Diagnosis not present

## 2023-11-25 DIAGNOSIS — B348 Other viral infections of unspecified site: Secondary | ICD-10-CM | POA: Diagnosis not present

## 2023-12-02 DIAGNOSIS — R29898 Other symptoms and signs involving the musculoskeletal system: Secondary | ICD-10-CM | POA: Diagnosis not present

## 2023-12-02 DIAGNOSIS — Z9889 Other specified postprocedural states: Secondary | ICD-10-CM | POA: Diagnosis not present

## 2023-12-05 DIAGNOSIS — R29898 Other symptoms and signs involving the musculoskeletal system: Secondary | ICD-10-CM | POA: Diagnosis not present

## 2023-12-05 DIAGNOSIS — Z9889 Other specified postprocedural states: Secondary | ICD-10-CM | POA: Diagnosis not present

## 2023-12-09 DIAGNOSIS — Z940 Kidney transplant status: Secondary | ICD-10-CM | POA: Diagnosis not present

## 2023-12-10 DIAGNOSIS — Z9889 Other specified postprocedural states: Secondary | ICD-10-CM | POA: Diagnosis not present

## 2023-12-10 DIAGNOSIS — R29898 Other symptoms and signs involving the musculoskeletal system: Secondary | ICD-10-CM | POA: Diagnosis not present

## 2023-12-12 DIAGNOSIS — N186 End stage renal disease: Secondary | ICD-10-CM | POA: Diagnosis not present

## 2023-12-13 DIAGNOSIS — Z9889 Other specified postprocedural states: Secondary | ICD-10-CM | POA: Diagnosis not present

## 2023-12-13 DIAGNOSIS — Z940 Kidney transplant status: Secondary | ICD-10-CM | POA: Diagnosis not present

## 2023-12-13 DIAGNOSIS — R29898 Other symptoms and signs involving the musculoskeletal system: Secondary | ICD-10-CM | POA: Diagnosis not present

## 2023-12-17 DIAGNOSIS — Z940 Kidney transplant status: Secondary | ICD-10-CM | POA: Diagnosis not present

## 2023-12-17 DIAGNOSIS — Z7952 Long term (current) use of systemic steroids: Secondary | ICD-10-CM | POA: Diagnosis not present

## 2023-12-17 DIAGNOSIS — Z79631 Long term (current) use of antimetabolite agent: Secondary | ICD-10-CM | POA: Diagnosis not present

## 2023-12-17 DIAGNOSIS — B348 Other viral infections of unspecified site: Secondary | ICD-10-CM | POA: Diagnosis not present

## 2023-12-18 DIAGNOSIS — Z9889 Other specified postprocedural states: Secondary | ICD-10-CM | POA: Diagnosis not present

## 2023-12-18 DIAGNOSIS — R29898 Other symptoms and signs involving the musculoskeletal system: Secondary | ICD-10-CM | POA: Diagnosis not present

## 2023-12-19 DIAGNOSIS — Z905 Acquired absence of kidney: Secondary | ICD-10-CM | POA: Diagnosis not present

## 2023-12-19 DIAGNOSIS — N186 End stage renal disease: Secondary | ICD-10-CM | POA: Diagnosis not present

## 2023-12-19 DIAGNOSIS — C642 Malignant neoplasm of left kidney, except renal pelvis: Secondary | ICD-10-CM | POA: Diagnosis not present

## 2023-12-20 DIAGNOSIS — Z940 Kidney transplant status: Secondary | ICD-10-CM | POA: Diagnosis not present

## 2023-12-20 DIAGNOSIS — Z9889 Other specified postprocedural states: Secondary | ICD-10-CM | POA: Diagnosis not present

## 2023-12-20 DIAGNOSIS — R29898 Other symptoms and signs involving the musculoskeletal system: Secondary | ICD-10-CM | POA: Diagnosis not present

## 2023-12-23 DIAGNOSIS — Z940 Kidney transplant status: Secondary | ICD-10-CM | POA: Diagnosis not present

## 2023-12-24 DIAGNOSIS — Z9889 Other specified postprocedural states: Secondary | ICD-10-CM | POA: Diagnosis not present

## 2023-12-24 DIAGNOSIS — R29898 Other symptoms and signs involving the musculoskeletal system: Secondary | ICD-10-CM | POA: Diagnosis not present

## 2023-12-27 DIAGNOSIS — R29898 Other symptoms and signs involving the musculoskeletal system: Secondary | ICD-10-CM | POA: Diagnosis not present

## 2023-12-27 DIAGNOSIS — Z9889 Other specified postprocedural states: Secondary | ICD-10-CM | POA: Diagnosis not present

## 2023-12-31 DIAGNOSIS — Z9889 Other specified postprocedural states: Secondary | ICD-10-CM | POA: Diagnosis not present

## 2023-12-31 DIAGNOSIS — R29898 Other symptoms and signs involving the musculoskeletal system: Secondary | ICD-10-CM | POA: Diagnosis not present

## 2024-01-03 DIAGNOSIS — Z9889 Other specified postprocedural states: Secondary | ICD-10-CM | POA: Diagnosis not present

## 2024-01-03 DIAGNOSIS — R29898 Other symptoms and signs involving the musculoskeletal system: Secondary | ICD-10-CM | POA: Diagnosis not present

## 2024-01-06 DIAGNOSIS — Z940 Kidney transplant status: Secondary | ICD-10-CM | POA: Diagnosis not present

## 2024-01-07 DIAGNOSIS — R29898 Other symptoms and signs involving the musculoskeletal system: Secondary | ICD-10-CM | POA: Diagnosis not present

## 2024-01-07 DIAGNOSIS — Z9889 Other specified postprocedural states: Secondary | ICD-10-CM | POA: Diagnosis not present

## 2024-01-10 DIAGNOSIS — Z9889 Other specified postprocedural states: Secondary | ICD-10-CM | POA: Diagnosis not present

## 2024-01-10 DIAGNOSIS — R29898 Other symptoms and signs involving the musculoskeletal system: Secondary | ICD-10-CM | POA: Diagnosis not present

## 2024-01-13 DIAGNOSIS — M25512 Pain in left shoulder: Secondary | ICD-10-CM | POA: Diagnosis not present

## 2024-01-13 DIAGNOSIS — Z0000 Encounter for general adult medical examination without abnormal findings: Secondary | ICD-10-CM | POA: Diagnosis not present

## 2024-01-14 ENCOUNTER — Other Ambulatory Visit: Payer: Self-pay | Admitting: Family Medicine

## 2024-01-14 DIAGNOSIS — Z79899 Other long term (current) drug therapy: Secondary | ICD-10-CM

## 2024-01-14 DIAGNOSIS — R29898 Other symptoms and signs involving the musculoskeletal system: Secondary | ICD-10-CM | POA: Diagnosis not present

## 2024-01-14 DIAGNOSIS — Z9889 Other specified postprocedural states: Secondary | ICD-10-CM | POA: Diagnosis not present

## 2024-01-17 DIAGNOSIS — Z940 Kidney transplant status: Secondary | ICD-10-CM | POA: Diagnosis not present

## 2024-01-17 DIAGNOSIS — Z131 Encounter for screening for diabetes mellitus: Secondary | ICD-10-CM | POA: Diagnosis not present

## 2024-01-20 DIAGNOSIS — L659 Nonscarring hair loss, unspecified: Secondary | ICD-10-CM | POA: Diagnosis not present

## 2024-01-20 DIAGNOSIS — G4733 Obstructive sleep apnea (adult) (pediatric): Secondary | ICD-10-CM | POA: Diagnosis not present

## 2024-01-20 DIAGNOSIS — Z131 Encounter for screening for diabetes mellitus: Secondary | ICD-10-CM | POA: Diagnosis not present

## 2024-01-20 DIAGNOSIS — Z4822 Encounter for aftercare following kidney transplant: Secondary | ICD-10-CM | POA: Diagnosis not present

## 2024-01-20 DIAGNOSIS — R739 Hyperglycemia, unspecified: Secondary | ICD-10-CM | POA: Diagnosis not present

## 2024-01-20 DIAGNOSIS — E119 Type 2 diabetes mellitus without complications: Secondary | ICD-10-CM | POA: Diagnosis not present

## 2024-01-20 DIAGNOSIS — Z940 Kidney transplant status: Secondary | ICD-10-CM | POA: Diagnosis not present

## 2024-01-20 DIAGNOSIS — Z905 Acquired absence of kidney: Secondary | ICD-10-CM | POA: Diagnosis not present

## 2024-01-20 DIAGNOSIS — Z7982 Long term (current) use of aspirin: Secondary | ICD-10-CM | POA: Diagnosis not present

## 2024-01-20 DIAGNOSIS — E871 Hypo-osmolality and hyponatremia: Secondary | ICD-10-CM | POA: Diagnosis not present

## 2024-01-20 DIAGNOSIS — I25119 Atherosclerotic heart disease of native coronary artery with unspecified angina pectoris: Secondary | ICD-10-CM | POA: Diagnosis not present

## 2024-01-20 DIAGNOSIS — C642 Malignant neoplasm of left kidney, except renal pelvis: Secondary | ICD-10-CM | POA: Diagnosis not present

## 2024-01-20 DIAGNOSIS — Z79899 Other long term (current) drug therapy: Secondary | ICD-10-CM | POA: Diagnosis not present

## 2024-01-20 DIAGNOSIS — I10 Essential (primary) hypertension: Secondary | ICD-10-CM | POA: Diagnosis not present

## 2024-01-20 DIAGNOSIS — Z79621 Long term (current) use of calcineurin inhibitor: Secondary | ICD-10-CM | POA: Diagnosis not present

## 2024-01-20 DIAGNOSIS — B259 Cytomegaloviral disease, unspecified: Secondary | ICD-10-CM | POA: Diagnosis not present

## 2024-01-20 DIAGNOSIS — E785 Hyperlipidemia, unspecified: Secondary | ICD-10-CM | POA: Diagnosis not present

## 2024-01-20 DIAGNOSIS — Z5181 Encounter for therapeutic drug level monitoring: Secondary | ICD-10-CM | POA: Diagnosis not present

## 2024-01-20 DIAGNOSIS — I119 Hypertensive heart disease without heart failure: Secondary | ICD-10-CM | POA: Diagnosis not present

## 2024-01-20 DIAGNOSIS — E1165 Type 2 diabetes mellitus with hyperglycemia: Secondary | ICD-10-CM | POA: Diagnosis not present

## 2024-01-20 DIAGNOSIS — B348 Other viral infections of unspecified site: Secondary | ICD-10-CM | POA: Diagnosis not present

## 2024-01-20 DIAGNOSIS — D849 Immunodeficiency, unspecified: Secondary | ICD-10-CM | POA: Diagnosis not present

## 2024-01-21 DIAGNOSIS — M75122 Complete rotator cuff tear or rupture of left shoulder, not specified as traumatic: Secondary | ICD-10-CM | POA: Diagnosis not present

## 2024-01-21 DIAGNOSIS — Z4789 Encounter for other orthopedic aftercare: Secondary | ICD-10-CM | POA: Diagnosis not present

## 2024-01-24 DIAGNOSIS — R29898 Other symptoms and signs involving the musculoskeletal system: Secondary | ICD-10-CM | POA: Diagnosis not present

## 2024-01-24 DIAGNOSIS — Z9889 Other specified postprocedural states: Secondary | ICD-10-CM | POA: Diagnosis not present

## 2024-01-27 DIAGNOSIS — R29898 Other symptoms and signs involving the musculoskeletal system: Secondary | ICD-10-CM | POA: Diagnosis not present

## 2024-01-27 DIAGNOSIS — Z9889 Other specified postprocedural states: Secondary | ICD-10-CM | POA: Diagnosis not present

## 2024-01-28 DIAGNOSIS — E790 Hyperuricemia without signs of inflammatory arthritis and tophaceous disease: Secondary | ICD-10-CM | POA: Diagnosis not present

## 2024-01-28 DIAGNOSIS — I2510 Atherosclerotic heart disease of native coronary artery without angina pectoris: Secondary | ICD-10-CM | POA: Diagnosis not present

## 2024-01-28 DIAGNOSIS — I10 Essential (primary) hypertension: Secondary | ICD-10-CM | POA: Diagnosis not present

## 2024-01-28 DIAGNOSIS — N1832 Chronic kidney disease, stage 3b: Secondary | ICD-10-CM | POA: Diagnosis not present

## 2024-01-28 DIAGNOSIS — Z1283 Encounter for screening for malignant neoplasm of skin: Secondary | ICD-10-CM | POA: Diagnosis not present

## 2024-01-28 DIAGNOSIS — E890 Postprocedural hypothyroidism: Secondary | ICD-10-CM | POA: Diagnosis not present

## 2024-01-28 DIAGNOSIS — E119 Type 2 diabetes mellitus without complications: Secondary | ICD-10-CM | POA: Diagnosis not present

## 2024-01-28 DIAGNOSIS — G4733 Obstructive sleep apnea (adult) (pediatric): Secondary | ICD-10-CM | POA: Diagnosis not present

## 2024-01-28 DIAGNOSIS — Z940 Kidney transplant status: Secondary | ICD-10-CM | POA: Diagnosis not present

## 2024-01-31 DIAGNOSIS — Z940 Kidney transplant status: Secondary | ICD-10-CM | POA: Diagnosis not present

## 2024-02-06 DIAGNOSIS — Z9889 Other specified postprocedural states: Secondary | ICD-10-CM | POA: Diagnosis not present

## 2024-02-06 DIAGNOSIS — R29898 Other symptoms and signs involving the musculoskeletal system: Secondary | ICD-10-CM | POA: Diagnosis not present

## 2024-02-08 DIAGNOSIS — S161XXA Strain of muscle, fascia and tendon at neck level, initial encounter: Secondary | ICD-10-CM | POA: Diagnosis not present

## 2024-02-10 DIAGNOSIS — Z9889 Other specified postprocedural states: Secondary | ICD-10-CM | POA: Diagnosis not present

## 2024-02-10 DIAGNOSIS — R29898 Other symptoms and signs involving the musculoskeletal system: Secondary | ICD-10-CM | POA: Diagnosis not present

## 2024-02-13 DIAGNOSIS — R29898 Other symptoms and signs involving the musculoskeletal system: Secondary | ICD-10-CM | POA: Diagnosis not present

## 2024-02-13 DIAGNOSIS — Z9889 Other specified postprocedural states: Secondary | ICD-10-CM | POA: Diagnosis not present

## 2024-02-14 DIAGNOSIS — Z940 Kidney transplant status: Secondary | ICD-10-CM | POA: Diagnosis not present

## 2024-02-18 DIAGNOSIS — Z905 Acquired absence of kidney: Secondary | ICD-10-CM | POA: Diagnosis not present

## 2024-02-18 DIAGNOSIS — Z9889 Other specified postprocedural states: Secondary | ICD-10-CM | POA: Diagnosis not present

## 2024-02-18 DIAGNOSIS — D849 Immunodeficiency, unspecified: Secondary | ICD-10-CM | POA: Diagnosis not present

## 2024-02-18 DIAGNOSIS — E871 Hypo-osmolality and hyponatremia: Secondary | ICD-10-CM | POA: Diagnosis not present

## 2024-02-18 DIAGNOSIS — Z7982 Long term (current) use of aspirin: Secondary | ICD-10-CM | POA: Diagnosis not present

## 2024-02-18 DIAGNOSIS — Z940 Kidney transplant status: Secondary | ICD-10-CM | POA: Diagnosis not present

## 2024-02-18 DIAGNOSIS — Z79621 Long term (current) use of calcineurin inhibitor: Secondary | ICD-10-CM | POA: Diagnosis not present

## 2024-02-18 DIAGNOSIS — G4733 Obstructive sleep apnea (adult) (pediatric): Secondary | ICD-10-CM | POA: Diagnosis not present

## 2024-02-18 DIAGNOSIS — I25119 Atherosclerotic heart disease of native coronary artery with unspecified angina pectoris: Secondary | ICD-10-CM | POA: Diagnosis not present

## 2024-02-18 DIAGNOSIS — C642 Malignant neoplasm of left kidney, except renal pelvis: Secondary | ICD-10-CM | POA: Diagnosis not present

## 2024-02-18 DIAGNOSIS — Z4822 Encounter for aftercare following kidney transplant: Secondary | ICD-10-CM | POA: Diagnosis not present

## 2024-02-18 DIAGNOSIS — I119 Hypertensive heart disease without heart failure: Secondary | ICD-10-CM | POA: Diagnosis not present

## 2024-02-18 DIAGNOSIS — Z79899 Other long term (current) drug therapy: Secondary | ICD-10-CM | POA: Diagnosis not present

## 2024-02-18 DIAGNOSIS — Z9089 Acquired absence of other organs: Secondary | ICD-10-CM | POA: Diagnosis not present

## 2024-02-18 DIAGNOSIS — Z5181 Encounter for therapeutic drug level monitoring: Secondary | ICD-10-CM | POA: Diagnosis not present

## 2024-02-18 DIAGNOSIS — E119 Type 2 diabetes mellitus without complications: Secondary | ICD-10-CM | POA: Diagnosis not present

## 2024-02-18 DIAGNOSIS — I10 Essential (primary) hypertension: Secondary | ICD-10-CM | POA: Diagnosis not present

## 2024-02-20 DIAGNOSIS — R29898 Other symptoms and signs involving the musculoskeletal system: Secondary | ICD-10-CM | POA: Diagnosis not present

## 2024-02-20 DIAGNOSIS — Z9889 Other specified postprocedural states: Secondary | ICD-10-CM | POA: Diagnosis not present

## 2024-02-26 DIAGNOSIS — M25512 Pain in left shoulder: Secondary | ICD-10-CM | POA: Diagnosis not present

## 2024-02-28 DIAGNOSIS — Z940 Kidney transplant status: Secondary | ICD-10-CM | POA: Diagnosis not present

## 2024-03-13 DIAGNOSIS — Z940 Kidney transplant status: Secondary | ICD-10-CM | POA: Diagnosis not present

## 2024-03-16 DIAGNOSIS — Z940 Kidney transplant status: Secondary | ICD-10-CM | POA: Diagnosis not present

## 2024-03-16 DIAGNOSIS — B348 Other viral infections of unspecified site: Secondary | ICD-10-CM | POA: Diagnosis not present

## 2024-03-16 DIAGNOSIS — Z5181 Encounter for therapeutic drug level monitoring: Secondary | ICD-10-CM | POA: Diagnosis not present

## 2024-03-16 DIAGNOSIS — D849 Immunodeficiency, unspecified: Secondary | ICD-10-CM | POA: Diagnosis not present

## 2024-03-16 DIAGNOSIS — Z4822 Encounter for aftercare following kidney transplant: Secondary | ICD-10-CM | POA: Diagnosis not present

## 2024-03-24 ENCOUNTER — Encounter (HOSPITAL_COMMUNITY): Payer: Self-pay

## 2024-03-30 DIAGNOSIS — Z940 Kidney transplant status: Secondary | ICD-10-CM | POA: Diagnosis not present

## 2024-04-10 DIAGNOSIS — Z940 Kidney transplant status: Secondary | ICD-10-CM | POA: Diagnosis not present

## 2024-04-13 DIAGNOSIS — D849 Immunodeficiency, unspecified: Secondary | ICD-10-CM | POA: Diagnosis not present

## 2024-04-13 DIAGNOSIS — E1165 Type 2 diabetes mellitus with hyperglycemia: Secondary | ICD-10-CM | POA: Diagnosis not present

## 2024-04-13 DIAGNOSIS — D638 Anemia in other chronic diseases classified elsewhere: Secondary | ICD-10-CM | POA: Diagnosis not present

## 2024-04-13 DIAGNOSIS — B348 Other viral infections of unspecified site: Secondary | ICD-10-CM | POA: Diagnosis not present

## 2024-04-13 DIAGNOSIS — Z5181 Encounter for therapeutic drug level monitoring: Secondary | ICD-10-CM | POA: Diagnosis not present

## 2024-04-13 DIAGNOSIS — I10 Essential (primary) hypertension: Secondary | ICD-10-CM | POA: Diagnosis not present

## 2024-04-13 DIAGNOSIS — Z4822 Encounter for aftercare following kidney transplant: Secondary | ICD-10-CM | POA: Diagnosis not present

## 2024-04-13 DIAGNOSIS — E785 Hyperlipidemia, unspecified: Secondary | ICD-10-CM | POA: Diagnosis not present

## 2024-04-13 DIAGNOSIS — Z940 Kidney transplant status: Secondary | ICD-10-CM | POA: Diagnosis not present

## 2024-04-13 DIAGNOSIS — Z79621 Long term (current) use of calcineurin inhibitor: Secondary | ICD-10-CM | POA: Diagnosis not present

## 2024-04-23 ENCOUNTER — Other Ambulatory Visit (HOSPITAL_BASED_OUTPATIENT_CLINIC_OR_DEPARTMENT_OTHER)

## 2024-04-27 DIAGNOSIS — Z940 Kidney transplant status: Secondary | ICD-10-CM | POA: Diagnosis not present

## 2024-04-29 ENCOUNTER — Encounter (HOSPITAL_COMMUNITY): Payer: Self-pay

## 2024-05-05 ENCOUNTER — Encounter (HOSPITAL_COMMUNITY): Payer: Self-pay

## 2024-05-06 ENCOUNTER — Encounter: Payer: Self-pay | Admitting: Dermatology

## 2024-05-06 ENCOUNTER — Ambulatory Visit: Admitting: Dermatology

## 2024-05-06 VITALS — BP 137/79 | HR 66

## 2024-05-06 DIAGNOSIS — L821 Other seborrheic keratosis: Secondary | ICD-10-CM | POA: Diagnosis not present

## 2024-05-06 DIAGNOSIS — L814 Other melanin hyperpigmentation: Secondary | ICD-10-CM

## 2024-05-06 DIAGNOSIS — W908XXA Exposure to other nonionizing radiation, initial encounter: Secondary | ICD-10-CM

## 2024-05-06 DIAGNOSIS — L578 Other skin changes due to chronic exposure to nonionizing radiation: Secondary | ICD-10-CM

## 2024-05-06 DIAGNOSIS — D849 Immunodeficiency, unspecified: Secondary | ICD-10-CM

## 2024-05-06 DIAGNOSIS — D229 Melanocytic nevi, unspecified: Secondary | ICD-10-CM

## 2024-05-06 DIAGNOSIS — D1801 Hemangioma of skin and subcutaneous tissue: Secondary | ICD-10-CM | POA: Diagnosis not present

## 2024-05-06 DIAGNOSIS — Z1283 Encounter for screening for malignant neoplasm of skin: Secondary | ICD-10-CM

## 2024-05-06 DIAGNOSIS — D1721 Benign lipomatous neoplasm of skin and subcutaneous tissue of right arm: Secondary | ICD-10-CM

## 2024-05-06 DIAGNOSIS — L281 Prurigo nodularis: Secondary | ICD-10-CM

## 2024-05-06 NOTE — Progress Notes (Signed)
 New Patient Visit   Subjective  Phillip Blair is a 68 y.o. male who presents for the following: Skin Cancer Screening and Full Body Skin Exam  The patient presents for Total-Body Skin Exam (TBSE) for skin cancer screening and mole check. The patient has spots, moles and lesions to be evaluated, some may be new or changing.  Patient had a kidney transplant on 06/30/2022 at [facility].   The following portions of the chart were reviewed this encounter and updated as appropriate: medications, allergies, medical history  Review of Systems:  No other skin or systemic complaints except as noted in HPI or Assessment and Plan.  Objective  Well appearing patient in no apparent distress; mood and affect are within normal limits.  A full examination was performed including scalp, head, eyes, ears, nose, lips, neck, chest, axillae, abdomen, back, buttocks, bilateral upper extremities, bilateral lower extremities, hands, feet, fingers, toes, fingernails, and toenails. All findings within normal limits unless otherwise noted below.   Relevant physical exam findings are noted in the Assessment and Plan.    Assessment & Plan   SKIN CANCER SCREENING PERFORMED TODAY.  ACTINIC DAMAGE - Chronic condition, secondary to cumulative UV/sun exposure - diffuse scaly erythematous macules with underlying dyspigmentation - Recommend daily broad spectrum sunscreen SPF 30+ to sun-exposed areas, reapply every 2 hours as needed.  - Staying in the shade or wearing long sleeves, sun glasses (UVA+UVB protection) and wide brim hats (4-inch brim around the entire circumference of the hat) are also recommended for sun protection.  - Call for new or changing lesions.  LENTIGINES, SEBORRHEIC KERATOSES, HEMANGIOMAS - Benign normal skin lesions - Benign-appearing - Call for any changes  MELANOCYTIC NEVI - Tan-brown and/or pink-flesh-colored symmetric macules and papules - Benign appearing on exam today -  Observation - Call clinic for new or changing moles - Recommend daily use of broad spectrum spf 30+ sunscreen to sun-exposed areas.   Lipoma  Exam: Subcutaneous rubbery nodule(s) Location: right anterior forearm  Benign-appearing. Exam most consistent with an Lipoma. Discussed that a Lipoma is a benign fatty growth that can grow over time and sometimes become painful or otherwise symptomatic. Some patients may have one or several lipomas.. Benign Hereditary Lipomatosis is a hereditary familial condition where family members tend to grow multiple lipomas.  Recommend observation if it is not changing, growing or symptomatic. Recommend surgical excision to remove it if it is painful, growing, symptomatic, or other changes noted. Please contact our office for new or changing lesions so they can be evaluated.  PRURIGO NODULARIS/LICHEN SIMPLEX CHRONICUS Exam: Excoriated papules and nodules x3 on the scalp  flared  Lichen simplex chronicus (LSC) is a persistent itchy area of thickened skin that is induced by chronic rubbing and/or scratching (chronic dermatitis).  These areas may be pink, hyperpigmented and may have excoriations and bumps (prurigo nodules-PN).  PN/LSC is commonly observed in uncontrolled atopic dermatitis and other forms of eczema, and in other itchy skin conditions (eg, insect bites, scabies).  Sometimes it is not possible to know initial cause of PN/LSC if it has been present for a long time.  It generally responds well to treatment with high potency topical steroids.  It is important to stop rubbing/scratching the area in order to break the itch-scratch-rash-itch cycle, in order for the rash to resolve.   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  I, Rollene Gobble, RN, am acting as scribe for RUFUS CHRISTELLA HOLY, MD .   Documentation: I have reviewed the above documentation for accuracy and completeness, and I agree with the above.  RUFUS CHRISTELLA HOLY, MD

## 2024-05-06 NOTE — Patient Instructions (Signed)

## 2024-05-21 DIAGNOSIS — N183 Chronic kidney disease, stage 3 unspecified: Secondary | ICD-10-CM | POA: Diagnosis not present

## 2024-05-21 DIAGNOSIS — I129 Hypertensive chronic kidney disease with stage 1 through stage 4 chronic kidney disease, or unspecified chronic kidney disease: Secondary | ICD-10-CM | POA: Diagnosis not present

## 2024-05-21 DIAGNOSIS — E1122 Type 2 diabetes mellitus with diabetic chronic kidney disease: Secondary | ICD-10-CM | POA: Diagnosis not present

## 2024-05-21 DIAGNOSIS — Z940 Kidney transplant status: Secondary | ICD-10-CM | POA: Diagnosis not present

## 2024-05-21 DIAGNOSIS — R3129 Other microscopic hematuria: Secondary | ICD-10-CM | POA: Diagnosis not present

## 2024-06-22 DIAGNOSIS — Z940 Kidney transplant status: Secondary | ICD-10-CM | POA: Diagnosis not present

## 2024-06-22 DIAGNOSIS — E559 Vitamin D deficiency, unspecified: Secondary | ICD-10-CM | POA: Diagnosis not present

## 2024-06-23 DIAGNOSIS — Z940 Kidney transplant status: Secondary | ICD-10-CM | POA: Diagnosis not present

## 2024-06-23 DIAGNOSIS — N183 Chronic kidney disease, stage 3 unspecified: Secondary | ICD-10-CM | POA: Diagnosis not present

## 2024-06-23 DIAGNOSIS — E559 Vitamin D deficiency, unspecified: Secondary | ICD-10-CM | POA: Diagnosis not present

## 2024-06-23 DIAGNOSIS — B348 Other viral infections of unspecified site: Secondary | ICD-10-CM | POA: Diagnosis not present

## 2024-06-23 DIAGNOSIS — M898X9 Other specified disorders of bone, unspecified site: Secondary | ICD-10-CM | POA: Diagnosis not present

## 2024-06-23 DIAGNOSIS — I129 Hypertensive chronic kidney disease with stage 1 through stage 4 chronic kidney disease, or unspecified chronic kidney disease: Secondary | ICD-10-CM | POA: Diagnosis not present

## 2024-06-29 DIAGNOSIS — I10 Essential (primary) hypertension: Secondary | ICD-10-CM | POA: Diagnosis not present

## 2024-06-29 DIAGNOSIS — Z9089 Acquired absence of other organs: Secondary | ICD-10-CM | POA: Diagnosis not present

## 2024-06-29 DIAGNOSIS — I25119 Atherosclerotic heart disease of native coronary artery with unspecified angina pectoris: Secondary | ICD-10-CM | POA: Diagnosis not present

## 2024-06-29 DIAGNOSIS — Z9889 Other specified postprocedural states: Secondary | ICD-10-CM | POA: Diagnosis not present

## 2024-06-29 DIAGNOSIS — D638 Anemia in other chronic diseases classified elsewhere: Secondary | ICD-10-CM | POA: Diagnosis not present

## 2024-06-29 DIAGNOSIS — E119 Type 2 diabetes mellitus without complications: Secondary | ICD-10-CM | POA: Diagnosis not present

## 2024-06-29 DIAGNOSIS — Z0000 Encounter for general adult medical examination without abnormal findings: Secondary | ICD-10-CM | POA: Diagnosis not present

## 2024-06-29 DIAGNOSIS — Z79899 Other long term (current) drug therapy: Secondary | ICD-10-CM | POA: Diagnosis not present

## 2024-06-29 DIAGNOSIS — C642 Malignant neoplasm of left kidney, except renal pelvis: Secondary | ICD-10-CM | POA: Diagnosis not present

## 2024-06-29 DIAGNOSIS — Z79621 Long term (current) use of calcineurin inhibitor: Secondary | ICD-10-CM | POA: Diagnosis not present

## 2024-06-29 DIAGNOSIS — Z4822 Encounter for aftercare following kidney transplant: Secondary | ICD-10-CM | POA: Diagnosis not present

## 2024-06-29 DIAGNOSIS — E1165 Type 2 diabetes mellitus with hyperglycemia: Secondary | ICD-10-CM | POA: Diagnosis not present

## 2024-06-29 DIAGNOSIS — B348 Other viral infections of unspecified site: Secondary | ICD-10-CM | POA: Diagnosis not present

## 2024-06-29 DIAGNOSIS — D649 Anemia, unspecified: Secondary | ICD-10-CM | POA: Diagnosis not present

## 2024-06-29 DIAGNOSIS — D849 Immunodeficiency, unspecified: Secondary | ICD-10-CM | POA: Diagnosis not present

## 2024-06-29 DIAGNOSIS — G4730 Sleep apnea, unspecified: Secondary | ICD-10-CM | POA: Diagnosis not present

## 2024-06-29 DIAGNOSIS — Z860101 Personal history of adenomatous and serrated colon polyps: Secondary | ICD-10-CM | POA: Diagnosis not present

## 2024-06-29 DIAGNOSIS — E785 Hyperlipidemia, unspecified: Secondary | ICD-10-CM | POA: Diagnosis not present

## 2024-06-29 DIAGNOSIS — Z7982 Long term (current) use of aspirin: Secondary | ICD-10-CM | POA: Diagnosis not present

## 2024-06-29 DIAGNOSIS — Z905 Acquired absence of kidney: Secondary | ICD-10-CM | POA: Diagnosis not present

## 2024-06-29 DIAGNOSIS — Z940 Kidney transplant status: Secondary | ICD-10-CM | POA: Diagnosis not present

## 2024-06-29 DIAGNOSIS — Z5181 Encounter for therapeutic drug level monitoring: Secondary | ICD-10-CM | POA: Diagnosis not present

## 2024-07-06 DIAGNOSIS — Z940 Kidney transplant status: Secondary | ICD-10-CM | POA: Diagnosis not present

## 2024-07-14 ENCOUNTER — Encounter: Admitting: Dermatology

## 2024-09-29 ENCOUNTER — Other Ambulatory Visit

## 2024-10-10 ENCOUNTER — Encounter: Payer: Self-pay | Admitting: Internal Medicine

## 2024-10-12 ENCOUNTER — Encounter: Payer: Self-pay | Admitting: Internal Medicine

## 2024-10-16 NOTE — Progress Notes (Signed)
 Phillip Blair                                          MRN: [id number]   10/16/2024   The [facility] Quality Team Specialist reviewed this patient medical record for the purposes of chart review for care gap closure. The following were reviewed: abstraction for care gap closure-glycemic status assessment.    [facility] Quality Team

## 2024-10-21 ENCOUNTER — Other Ambulatory Visit: Payer: Self-pay | Admitting: Urology

## 2024-10-21 DIAGNOSIS — Q613 Polycystic kidney, unspecified: Secondary | ICD-10-CM

## 2024-10-21 DIAGNOSIS — C642 Malignant neoplasm of left kidney, except renal pelvis: Secondary | ICD-10-CM

## 2024-10-22 ENCOUNTER — Encounter (HOSPITAL_COMMUNITY): Payer: Self-pay

## 2024-10-29 ENCOUNTER — Other Ambulatory Visit: Payer: Self-pay | Admitting: Medical Genetics

## 2024-10-30 ENCOUNTER — Other Ambulatory Visit (HOSPITAL_COMMUNITY): Admission: RE | Admit: 2024-10-30

## 2024-11-03 ENCOUNTER — Encounter

## 2024-11-10 ENCOUNTER — Ambulatory Visit: Admitting: Dermatology

## 2024-11-17 ENCOUNTER — Encounter: Admitting: Internal Medicine

## 2024-11-19 ENCOUNTER — Other Ambulatory Visit (HOSPITAL_BASED_OUTPATIENT_CLINIC_OR_DEPARTMENT_OTHER)

## 2024-11-26 ENCOUNTER — Other Ambulatory Visit
